# Patient Record
Sex: Male | Born: 1960 | Race: Black or African American | Hispanic: No | Marital: Single | Smoking: Former smoker
Health system: Southern US, Community
[De-identification: ages and names within clinical notes are randomized; demographics above are authoritative.]

## PROBLEM LIST (undated history)

## (undated) DIAGNOSIS — N400 Enlarged prostate without lower urinary tract symptoms: Secondary | ICD-10-CM

## (undated) DIAGNOSIS — G4733 Obstructive sleep apnea (adult) (pediatric): Secondary | ICD-10-CM

## (undated) DIAGNOSIS — R7303 Prediabetes: Secondary | ICD-10-CM

## (undated) DIAGNOSIS — E119 Type 2 diabetes mellitus without complications: Secondary | ICD-10-CM

## (undated) DIAGNOSIS — R7989 Other specified abnormal findings of blood chemistry: Secondary | ICD-10-CM

## (undated) DIAGNOSIS — M75100 Unspecified rotator cuff tear or rupture of unspecified shoulder, not specified as traumatic: Secondary | ICD-10-CM

## (undated) DIAGNOSIS — M75120 Complete rotator cuff tear or rupture of unspecified shoulder, not specified as traumatic: Secondary | ICD-10-CM

## (undated) DIAGNOSIS — I517 Cardiomegaly: Secondary | ICD-10-CM

## (undated) DIAGNOSIS — I10 Essential (primary) hypertension: Secondary | ICD-10-CM

## (undated) DIAGNOSIS — S86019A Strain of unspecified Achilles tendon, initial encounter: Secondary | ICD-10-CM

## (undated) DIAGNOSIS — E785 Hyperlipidemia, unspecified: Secondary | ICD-10-CM

## (undated) DIAGNOSIS — N529 Male erectile dysfunction, unspecified: Secondary | ICD-10-CM

## (undated) HISTORY — PX: OTHER SURGICAL HISTORY: SHX169

## (undated) HISTORY — DX: Benign prostatic hyperplasia without lower urinary tract symptoms: N40.0

## (undated) HISTORY — DX: Male erectile dysfunction, unspecified: N52.9

## (undated) HISTORY — DX: Strain of unspecified achilles tendon, initial encounter: S86.019A

## (undated) HISTORY — DX: Hyperlipidemia, unspecified: E78.5

## (undated) HISTORY — DX: Morbid (severe) obesity due to excess calories: E66.01

## (undated) HISTORY — DX: Essential (primary) hypertension: I10

## (undated) HISTORY — DX: Other specified abnormal findings of blood chemistry: R79.89

---
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
## 2005-10-19 HISTORY — PX: ROTATOR CUFF REPAIR: SHX139

## 2006-02-10 ENCOUNTER — Ambulatory Visit (HOSPITAL_COMMUNITY): Admission: RE | Admit: 2006-02-10 | Discharge: 2006-02-11 | Payer: Self-pay | Admitting: Specialist

## 2006-11-30 ENCOUNTER — Ambulatory Visit: Payer: Self-pay

## 2006-12-29 ENCOUNTER — Ambulatory Visit: Payer: Self-pay

## 2009-04-06 ENCOUNTER — Encounter: Admission: RE | Admit: 2009-04-06 | Discharge: 2009-04-06 | Payer: Self-pay | Admitting: Specialist

## 2011-03-06 NOTE — Op Note (Signed)
NAMEFARD, BORUNDA          ACCOUNT NO.:  [id number]   MEDICAL RECORD NO.:  [id number]          PATIENT TYPE:  AMB   LOCATION:  DAY                          FACILITY:  [facility]   PHYSICIAN:  Jene Every, M.D.    DATE OF BIRTH:  August 26, 1961   DATE OF PROCEDURE:  02/10/2006  DATE OF DISCHARGE:                                 OPERATIVE REPORT   PREOPERATIVE DIAGNOSIS:  Massive tear of the rotator cuff, left shoulder,  impingement syndrome.   POSTOPERATIVE DIAGNOSIS:  Massive tear of the rotator cuff, left shoulder,  impingement syndrome.   OPERATION PERFORMED:  1.  Left rotator cuff repair, open mini.  2.  Subacromial decompression.  3.  Acromioplasty.   SURGEON:  Jene Every, M.D.   ASSISTANT:  Roma Schanz, P.A.   ESTIMATED BLOOD LOSS:  10 mL.   ANESTHESIA:  General.   INDICATIONS FOR PROCEDURE:  This is a 50 year old male who has had a massive  rotator cuff tear retracted to the glenohumeral joint and some glenohumeral  arthrosis.  Had pain and suboptimal range of motion.  Due to his young age,  he is indicated for repair of the rotator cuff though given the extent of  the tear, we thought we may not be able to repair it.  We did discuss the  risks and benefits including bleeding, infection, suboptimal range of  motion, recurrent __________  tear and need for patch or revision,  persistent pain, etc.   DESCRIPTION OF PROCEDURE:  Patient supine in beach chair position.  After  adequate induction of adequate general anesthesia and 1 g Kefzol, the left  shoulder and upper extremity was prepped and draped in the usual sterile  fashion.  Incision was made over the anterior aspect of the acromion in  Langer's lines.  Subcutaneous tissue was dissected.  Electrocautery utilized  to achieve hemostasis.  We divided the raphe between the anterior and the  middle head of the deltoid.  It was then full thickness subperiosteal  elevation from the deltoid attachment  medially and anterolaterally.  We  preserved its most superior attachment.  We divided the CA ligament and  excised that.  We placed a Bennett retractor in the subacromial space and  protected the rotator cuff with Hohmann's.  Used an oscillating saw to  perform an acromioplasty, there was a large anterior spur noted.  This was  further contoured with a high speed bur.  Next, following that we examined  the rotator cuff.  It was completely torn and retracted back towards the  medial aspect of the humeral head.  It appeared to be supraspinatus as well  as infraspinatus and also partly subscapularis.  These were debrided.  Formed a trough just lateral to the articular surface.  Approximately 2 mm  of articular surface was used as well to the greater tuberosity.  Good  cancellous tissue was noted and this was performed across the lateral,  anterior and posterior aspect of the humeral edges up to bicipital groove.  There was some tendinopathy of the bicipital tendon noted as well.  We  mobilized the cuff with an Allis.  This was digitally mobilized as well as  mobilized with a Cobb lysing subacromial adhesions.  Though the tendon  appeared to be intact, it was very attenuated.  This was advanced to the  trough.  Three Mitek suture anchors utilized to anchor to the trough, one  posteriorly, one laterally and one anteriorly just lateral to the bicipital  groove of humeral head.  Arm was abducted and this was advanced into the  tendon and the tendon was held in place with a suture with an excellent  repair.  Able to get his arm down to the side without tension on the repair.  Going anteriorly, one of the leaflets of the Ethibond to secure the subscap  and supraspinatus.  This was oversewn with #1 Vicryl interrupted figure-of-  eight sutures.  He had a full watertight closure noted.  There was no  residual impingement or spurring noted.  He was not tender over the [employer]  preoperatively.  Felt no need  at this point to perform distal clavicle  resection. Wound copiously irrigated once again.  No evidence of active  bleeding.  We has used 0.25% Marcaine with epinephrine in the subcutaneous  tissue.  We then repaired the raphe with #1 Vicryl interrupted figure-of-  eight sutures.  In the fascia subperiosteally we repaired that back as well  for the acromion.  The deltotrapezial fascia was repaired as well as the  subcutaneous tissue with 2-0 Vicryl and the skin with 4-0 subcuticular  Prolene.  Wound reinforced with Steri-Strips.  Sterile dressing applied.  The patient had abduction pillow, extubated without difficulty and  transported to recovery room satisfactory condition.  The patient tolerated  the procedure well without complication.      Jene Every, M.D.  Electronically Signed     JB/MEDQ  D:  02/10/2006  T:  02/11/2006  Job:  [id number]

## 2011-10-20 HISTORY — PX: DOPPLER ECHOCARDIOGRAPHY: SHX263

## 2012-04-17 ENCOUNTER — Encounter (HOSPITAL_COMMUNITY): Payer: Self-pay | Admitting: *Deleted

## 2012-04-17 ENCOUNTER — Observation Stay (HOSPITAL_COMMUNITY)
Admission: EM | Admit: 2012-04-17 | Discharge: 2012-04-19 | Disposition: A | Discharge status: Home / Self Care | Payer: BC Managed Care – PPO | Attending: Family Medicine | Admitting: Family Medicine

## 2012-04-17 ENCOUNTER — Emergency Department (HOSPITAL_COMMUNITY): Payer: BC Managed Care – PPO

## 2012-04-17 DIAGNOSIS — Z7982 Long term (current) use of aspirin: Secondary | ICD-10-CM | POA: Insufficient documentation

## 2012-04-17 DIAGNOSIS — R0602 Shortness of breath: Secondary | ICD-10-CM | POA: Insufficient documentation

## 2012-04-17 DIAGNOSIS — G4733 Obstructive sleep apnea (adult) (pediatric): Secondary | ICD-10-CM

## 2012-04-17 DIAGNOSIS — Z87891 Personal history of nicotine dependence: Secondary | ICD-10-CM | POA: Insufficient documentation

## 2012-04-17 DIAGNOSIS — R079 Chest pain, unspecified: Principal | ICD-10-CM

## 2012-04-17 DIAGNOSIS — I10 Essential (primary) hypertension: Secondary | ICD-10-CM

## 2012-04-17 DIAGNOSIS — Z79899 Other long term (current) drug therapy: Secondary | ICD-10-CM | POA: Insufficient documentation

## 2012-04-17 HISTORY — DX: Obstructive sleep apnea (adult) (pediatric): G47.33

## 2012-04-17 LAB — PROTIME-INR
INR: 0.9 (ref 0.00–1.49)
Prothrombin Time: 12.3 seconds (ref 11.6–15.2)

## 2012-04-17 LAB — COMPREHENSIVE METABOLIC PANEL
Albumin: 4 g/dL (ref 3.5–5.2)
BUN: 14 mg/dL (ref 6–23)
Calcium: 9.5 mg/dL (ref 8.4–10.5)
Creatinine, Ser: 1.14 mg/dL (ref 0.50–1.35)
GFR calc Af Amer: 85 mL/min — ABNORMAL LOW (ref >90)
Glucose, Bld: 109 mg/dL — ABNORMAL HIGH (ref 70–99)
Total Protein: 6.8 g/dL (ref 6.0–8.3)

## 2012-04-17 LAB — CBC
Hemoglobin: 15.1 g/dL (ref 13.0–17.0)
MCHC: 31.7 g/dL (ref 30.0–36.0)
MCV: 64.3 fL — ABNORMAL LOW (ref 78.0–100.0)
Platelets: 247 10*3/uL (ref 150–400)
RBC: 7.4 MIL/uL — ABNORMAL HIGH (ref 4.22–5.81)
RDW: 15.6 % — ABNORMAL HIGH (ref 11.5–15.5)

## 2012-04-17 LAB — URINE MICROSCOPIC-ADD ON

## 2012-04-17 LAB — URINALYSIS, ROUTINE W REFLEX MICROSCOPIC
Bilirubin Urine: NEGATIVE
Hgb urine dipstick: NEGATIVE
Nitrite: NEGATIVE
Protein, ur: NEGATIVE mg/dL
Urobilinogen, UA: 1 mg/dL (ref 0.0–1.0)

## 2012-04-17 LAB — APTT: aPTT: 33 seconds (ref 24–37)

## 2012-04-17 MED ORDER — ASPIRIN 81 MG PO CHEW: 324.0000 mg | CHEWABLE_TABLET | Freq: Once | ORAL | Status: DC

## 2012-04-17 MED ORDER — NITROGLYCERIN 2 % TD OINT
1.0000 [in_us] | TOPICAL_OINTMENT | Freq: Four times a day (QID) | TRANSDERMAL | Status: DC
  Filled 2012-04-17: qty 30

## 2012-04-17 MED ORDER — NITROGLYCERIN 0.4 MG SL SUBL
0.4000 mg | SUBLINGUAL_TABLET | SUBLINGUAL | Status: DC | PRN
  Administered 2012-04-18: 0.4 mg via SUBLINGUAL

## 2012-04-17 MED ORDER — SODIUM CHLORIDE 0.9 % IV SOLN
1000.0000 mL | INTRAVENOUS | Status: DC
  Administered 2012-04-17: 1000 mL via INTRAVENOUS

## 2012-04-17 NOTE — ED Provider Notes (Signed)
History     CSN: [id number]  Arrival date & time 04/17/12  2048   First MD Initiated Contact with Patient 04/17/12 2107      Chief Complaint  Patient presents with  . Chest Pain    HPI   The patient presents to the emergency room with complaints of chest tightness that started today at about 1530. Patient states he was at work when he suddenly experienced chest discomfort that lasted for a few minutes.  He felt somewhat short of breath with it but did not have any nausea or diaphoresis. The pain did not radiate anywhere. Patient does not have history of heart disease. He does not smoke cigarettes but did quit a few years ago.. There is no family history of heart problems. Patient states he has had some continued discomfort in his chest since the symptoms started. Nothing seems to be making it particularly better. The pain is moderate in nature. Pt states he has not been having a cough and just had one episode today. Past Medical History  Diagnosis Date  . Hypertension     History reviewed. No pertinent past surgical history.  History reviewed. No pertinent family history.  History  Substance Use Topics  . Smoking status: Former Smoker    Quit date: 04/17/2006  . Smokeless tobacco: Not on file  . Alcohol Use:       Review of Systems  All other systems reviewed and are negative.    Allergies  Review of patient's allergies indicates no known allergies.  Home Medications   Current Outpatient Rx  Name Route Sig Dispense Refill  . ASPIRIN EC 81 MG PO TBEC Oral Take 81 mg by mouth daily.    Marland Kitchen LOSARTAN POTASSIUM 100 MG PO TABS Oral Take 100 mg by mouth daily.      BP 147/91  Pulse 80  Temp 97.9 F (36.6 C) (Oral)  Resp 18  SpO2 98%  Physical Exam  Nursing note and vitals reviewed. Constitutional: He appears well-developed and well-nourished. No distress.  HENT:  Head: Normocephalic and atraumatic.  Right Ear: External ear normal.  Left Ear: External ear normal.    Eyes: Conjunctivae are normal. Right eye exhibits no discharge. Left eye exhibits no discharge. No scleral icterus.  Neck: Neck supple. No tracheal deviation present.  Cardiovascular: Normal rate, regular rhythm and intact distal pulses.   Pulmonary/Chest: Effort normal and breath sounds normal. No stridor. No respiratory distress. He has no wheezes. He has no rales.  Abdominal: Soft. Bowel sounds are normal. He exhibits no distension. There is no tenderness. There is no rebound and no guarding.  Musculoskeletal: He exhibits no edema and no tenderness.  Neurological: He is alert. He has normal strength. No sensory deficit. Cranial nerve deficit:  no gross defecits noted. He exhibits normal muscle tone. He displays no seizure activity. Coordination normal.  Skin: Skin is warm and dry. No rash noted.  Psychiatric: He has a normal mood and affect.    ED Course  Procedures (including critical care time)  Rate: 76  Rhythm: normal sinus rhythm  QRS Axis: normal  Intervals: normal  ST/T Wave abnormalities: normal  Conduction Disutrbances:none  Narrative Interpretation:   Old EKG Reviewed: none available  Labs Reviewed  CBC - Abnormal; Notable for the following:    RBC 7.40 (*)     MCV 64.3 (*)     MCH 20.4 (*)     RDW 15.6 (*)     All other components within  normal limits  URINALYSIS, ROUTINE W REFLEX MICROSCOPIC - Abnormal; Notable for the following:    Leukocytes, UA SMALL (*)     All other components within normal limits  COMPREHENSIVE METABOLIC PANEL - Abnormal; Notable for the following:    Glucose, Bld 109 (*)     Total Bilirubin 0.2 (*)     GFR calc non Af Amer 73 (*)     GFR calc Af Amer 85 (*)     All other components within normal limits  PROTIME-INR  APTT  POCT I-STAT TROPONIN I  URINE MICROSCOPIC-ADD ON  PRO B NATRIURETIC PEPTIDE   Dg Chest Portable 1 View  04/17/2012  *RADIOLOGY REPORT*  Clinical Data: Chest pain  PORTABLE CHEST - 1 VIEW  Comparison: None.   Findings: Prominent cardiomediastinal contours.  Central vascular congestion.  Mild interstitial prominence.  No pneumothorax.  No definite pleural effusion.  No focal consolidation.  No acute osseous finding.  IMPRESSION: Prominent cardiomediastinal contours and central vascular congestion.  Question mild interstitial edema versus atypical infection.  Original Report Authenticated By: Waneta Martins, M.D.     1. Chest pain       MDM  CXR suggests possible interstitial edema.  Will check BNP.    Symptoms are concerning for possible ACS.  Will continue to check cardiac markers.  Will consult regarding admission for rule out and risk stratification        Celene Kras, MD 04/17/12 2333

## 2012-04-17 NOTE — ED Notes (Signed)
Pt c/o chest tightness x 3 separate episodes, 1st at 1530 today. No associated s/s at any time. 5-6/10 tightness at present. Pt presents as pale. Pt states pain worsened by coughing which started a few minutes ago.

## 2012-04-18 DIAGNOSIS — R079 Chest pain, unspecified: Secondary | ICD-10-CM | POA: Diagnosis present

## 2012-04-18 DIAGNOSIS — I10 Essential (primary) hypertension: Secondary | ICD-10-CM

## 2012-04-18 DIAGNOSIS — G4733 Obstructive sleep apnea (adult) (pediatric): Secondary | ICD-10-CM

## 2012-04-18 HISTORY — DX: Essential (primary) hypertension: I10

## 2012-04-18 LAB — HEMOGLOBIN A1C
Hgb A1c MFr Bld: 5.9 % — ABNORMAL HIGH (ref <5.7)
Mean Plasma Glucose: 123 mg/dL — ABNORMAL HIGH (ref <117)

## 2012-04-18 LAB — CBC
HCT: 44.8 % (ref 39.0–52.0)
Hemoglobin: 14 g/dL (ref 13.0–17.0)
MCH: 20.3 pg — ABNORMAL LOW (ref 26.0–34.0)
MCHC: 31.3 g/dL (ref 30.0–36.0)
MCV: 64.8 fL — ABNORMAL LOW (ref 78.0–100.0)
RDW: 15.6 % — ABNORMAL HIGH (ref 11.5–15.5)

## 2012-04-18 LAB — CARDIAC PANEL(CRET KIN+CKTOT+MB+TROPI)
CK, MB: 4.9 ng/mL — ABNORMAL HIGH (ref 0.3–4.0)
CK, MB: 4 ng/mL (ref 0.3–4.0)
CK, MB: 3.7 ng/mL (ref 0.3–4.0)
Relative Index: 2.4 (ref 0.0–2.5)
Total CK: 162 U/L (ref 7–232)
Troponin I: <0.3 ng/mL (ref <0.30)
Troponin I: <0.3 ng/mL (ref <0.30)

## 2012-04-18 LAB — TSH: TSH: 1.225 u[IU]/mL (ref 0.350–4.500)

## 2012-04-18 LAB — LIPID PANEL
LDL Cholesterol: 64 mg/dL (ref 0–99)
Triglycerides: 120 mg/dL (ref <150)

## 2012-04-18 LAB — CREATININE, SERUM: GFR calc non Af Amer: 70 mL/min — ABNORMAL LOW (ref >90)

## 2012-04-18 MED ORDER — ENOXAPARIN SODIUM 40 MG/0.4ML ~~LOC~~ SOLN
40.0000 mg | SUBCUTANEOUS | Status: DC
  Administered 2012-04-18 – 2012-04-19 (×2): 40 mg via SUBCUTANEOUS
  Filled 2012-04-18 (×2): qty 0.4

## 2012-04-18 MED ORDER — ACETAMINOPHEN 650 MG RE SUPP: 650.0000 mg | Freq: Four times a day (QID) | RECTAL | Status: DC | PRN

## 2012-04-18 MED ORDER — ATORVASTATIN CALCIUM 10 MG PO TABS
10.0000 mg | ORAL_TABLET | Freq: Every day | ORAL | Status: DC
  Administered 2012-04-18: 10 mg via ORAL
  Filled 2012-04-18 (×2): qty 1

## 2012-04-18 MED ORDER — SODIUM CHLORIDE 0.9 % IV SOLN: 250.0000 mL | INTRAVENOUS | Status: DC | PRN

## 2012-04-18 MED ORDER — ASPIRIN EC 325 MG PO TBEC
325.0000 mg | DELAYED_RELEASE_TABLET | Freq: Every day | ORAL | Status: DC
  Administered 2012-04-18 – 2012-04-19 (×2): 325 mg via ORAL
  Filled 2012-04-18 (×2): qty 1

## 2012-04-18 MED ORDER — PANTOPRAZOLE SODIUM 40 MG PO TBEC
40.0000 mg | DELAYED_RELEASE_TABLET | Freq: Every day | ORAL | Status: DC
  Administered 2012-04-18 – 2012-04-19 (×2): 40 mg via ORAL
  Filled 2012-04-18 (×2): qty 1

## 2012-04-18 MED ORDER — SODIUM CHLORIDE 0.9 % IJ SOLN
3.0000 mL | Freq: Two times a day (BID) | INTRAMUSCULAR | Status: DC
  Administered 2012-04-18 – 2012-04-19 (×2): 3 mL via INTRAVENOUS

## 2012-04-18 MED ORDER — MORPHINE SULFATE 2 MG/ML IJ SOLN: 2.0000 mg | INTRAMUSCULAR | Status: DC | PRN

## 2012-04-18 MED ORDER — SODIUM CHLORIDE 0.9 % IJ SOLN
3.0000 mL | Freq: Two times a day (BID) | INTRAMUSCULAR | Status: DC
  Administered 2012-04-18: 3 mL via INTRAVENOUS

## 2012-04-18 MED ORDER — SODIUM CHLORIDE 0.9 % IJ SOLN: 3.0000 mL | INTRAMUSCULAR | Status: DC | PRN

## 2012-04-18 MED ORDER — ACETAMINOPHEN 325 MG PO TABS: 650.0000 mg | ORAL_TABLET | Freq: Four times a day (QID) | ORAL | Status: DC | PRN

## 2012-04-18 MED ORDER — LOSARTAN POTASSIUM 50 MG PO TABS
100.0000 mg | ORAL_TABLET | Freq: Every day | ORAL | Status: DC
  Administered 2012-04-18 – 2012-04-19 (×2): 100 mg via ORAL
  Filled 2012-04-18 (×2): qty 2

## 2012-04-18 NOTE — Progress Notes (Signed)
  Echocardiogram 2D Echocardiogram has been performed.  Jorje Guild 04/18/2012, 9:42 AM

## 2012-04-18 NOTE — ED Notes (Signed)
Report given to 4 th floor 

## 2012-04-18 NOTE — Progress Notes (Signed)
CPAP setup for pt to use at night. Placed on 10 cmH2O at this time with full face mask because he says he wears that at home. Pt unaware of home setting, but says this feels about right.  Jacqulynn Cadet RRT

## 2012-04-18 NOTE — H&P (Signed)
Hospital Admission Note Date: 04/18/2012  Patient name: Marc Stevens Medical record number: [id number] Date of birth: Sep 06, 1961 Age: 51 y.o. Gender: male PCP: No primary provider on file.  Attending physician: Altha Harm, MD  Chief Complaint: Intermittent chest pain  History of Present Illness: This is a 51 year old gentleman with a history of hypertension and obstructive sleep apnea who presented to the ER as he experienced intermittent chest tightness that occurred on 3 episodes this afternoon for approximately 20 minutes each episode. The patient states that he was performing nonstrenuous work at the time that the chest tightness occurred. He's unable to quantify the intensity of the tightness but stated that the pain was nonradiating and he had no palliative or provocative features and there were no accompanying symptoms. Specifically the patient denies any shortness of breath, diaphoresis, lightheadedness or weakness.  The patient states that he was in his usual state of health. He denies any fever, chills, nausea, vomiting, diarrhea, dysuria, frequency, syncope, near syncope or cough.  Scheduled Meds:   . aspirin  324 mg Oral Once  . DISCONTD: nitroGLYCERIN  1 inch Topical Q6H   Continuous Infusions:   . sodium chloride 1,000 mL (04/17/12 2213)   PRN Meds:.nitroGLYCERIN Allergies: Review of patient's allergies indicates no known allergies. Past Medical History  Diagnosis Date  . Hypertension    History reviewed. No pertinent past surgical history. History reviewed. No pertinent family history. History   Social History  . Marital Status: Single    Spouse Name: N/A    Number of Children: N/A  . Years of Education: N/A   Occupational History  . Not on file.   Social History Main Topics  . Smoking status: Former Smoker    Quit date: 04/17/2006  . Smokeless tobacco: Not on file  . Alcohol Use:   . Drug Use: No  . Sexually Active: Yes    Birth Control/  Protection: None   Other Topics Concern  . Not on file   Social History Narrative  . No narrative on file   Review of Systems: A comprehensive review of systems was negative except as noted in the history of present illness. Physical Exam: No intake or output data in the 24 hours ending 04/18/12 0039 General: The patient is a well-nourished well-developed gentleman in no acute distress resting comfortably in the bed. He is alert and oriented x3. HEENT: [place]/AT PEERL, EOMI Neck: Trachea midline,  no masses, no thyromegal,y no JVD, no carotid bruit OROPHARYNX:  Moist, No exudate/ erythema/lesions.  Heart: Regular rate and rhythm, without murmurs, rubs, gallops, PMI non-displaced, no heaves or thrills on palpation.  Lungs: Clear to auscultation, no wheezing or rhonchi noted. No increased vocal fremitus resonant to percussion  Abdomen: Soft, nontender, nondistended, positive bowel sounds, no masses no hepatosplenomegaly noted..  Neuro: No focal neurological deficits noted cranial nerves II through XII grossly intact. DTRs 2+ bilaterally upper and lower extremities. Strength functional in bilateral upper and lower extremities. Musculoskeletal: No warm swelling or erythema around joints, no spinal tenderness noted. Psychiatric: Patient alert and oriented x3, good insight and cognition, good recent to remote recall. Lymph node survey: No cervical axillary or inguinal lymphadenopathy noted.  Lab results:  [facility] 04/17/12 2132  NA 138  K 3.7  CL 103  CO2 25  GLUCOSE 109*  BUN 14  CREATININE 1.14  CALCIUM 9.5  MG --  PHOS --    Basename 04/17/12 2132  AST 26  ALT 29  ALKPHOS 62  BILITOT 0.2*  PROT 6.8  ALBUMIN 4.0   No results found for this basename: LIPASE:2,AMYLASE:2 in the last 72 hours  Basename 04/17/12 2132  WBC 6.1  NEUTROABS --  HGB 15.1  HCT 47.6  MCV 64.3*  PLT 247   No results found for this basename: CKTOTAL:3,CKMB:3,CKMBINDEX:3,TROPONINI:3 in the last 72  hours No components found with this basename: POCBNP:3 No results found for this basename: DDIMER:2 in the last 72 hours No results found for this basename: HGBA1C:2 in the last 72 hours No results found for this basename: CHOL:2,HDL:2,LDLCALC:2,TRIG:2,CHOLHDL:2,LDLDIRECT:2 in the last 72 hours No results found for this basename: TSH,T4TOTAL,FREET3,T3FREE,THYROIDAB in the last 72 hours No results found for this basename: VITAMINB12:2,FOLATE:2,FERRITIN:2,TIBC:2,IRON:2,RETICCTPCT:2 in the last 72 hours Imaging results:  Dg Chest Portable 1 View  04/17/2012  *RADIOLOGY REPORT*  Clinical Data: Chest pain  PORTABLE CHEST - 1 VIEW  Comparison: None.  Findings: Prominent cardiomediastinal contours.  Central vascular congestion.  Mild interstitial prominence.  No pneumothorax.  No definite pleural effusion.  No focal consolidation.  No acute osseous finding.  IMPRESSION: Prominent cardiomediastinal contours and central vascular congestion.  Question mild interstitial edema versus atypical infection.  Original Report Authenticated By: Waneta Martins, M.D.   Other results: EKG: normal EKG, normal sinus rhythm.   Patient Active Hospital Problem List: Chest pain (04/18/2012)   Assessment: The patient presents with intermittent chest pain. His risk factors are his gender and hypertension. He does however states that his father may have had a heart attack however it was his advanced age. The patient does not know his cholesterol numbers. Thus his chest port is 8% without his cholesterol numbers. The patient will be brought observation basis his enzyme cycle and a 2-D echocardiogram obtained. I will defer to the rounding physician to determine the need for a stress test based on further risk stratification.    HTN (hypertension) (04/18/2012)   Assessment: I resume his Cozaar.    Obstructive sleep apnea of adult (04/18/2012)   Assessment: Last respiratory to supply CPAP while the patient is hospitalized.        Mahogany Torrance A. 04/18/2012, 12:39 AM

## 2012-04-18 NOTE — Progress Notes (Signed)
12:03 PM I agree with HPI/GPe and A/P per Dr. Ashley Royalty  He still has some mild CP, 4/10 center of chest, -feels like a pressure.  No radiation, relieved by Morphine He states he has had this constantly since admit      HEENT-pleasant alert [name] in NAD CHEST clear.  No reproducible CP CARDIAC s1 s2 no m/r/g ABDOMEN soft, nt nd NEURO moves limbs x 4 SKIN/MUSCULAR no rash  Patient Active Problem List  Diagnosis  . Chest pain  . HTN (hypertension)  . Obstructive sleep apnea of adult   Given OSA, Htn h/o and features not consistent with any other findings will rpt CXr and cycle CE's Might need cardiologist eval for stress test vs cath  Marc Koch, MD [facility] [phone]

## 2012-04-18 NOTE — Care Management Note (Signed)
    Page 1 of 1   04/18/2012     2:16:27 PM   CARE MANAGEMENT NOTE 04/18/2012  Patient:  Marc Stevens, Marc Stevens   Account Number:  [id number]  Date Initiated:  04/18/2012  Documentation initiated by:  Lanier Clam  Subjective/Objective Assessment:   ADMITTED W/CHEST PAIN.     Action/Plan:   FROM HOME   Anticipated DC Date:  04/21/2012   Anticipated DC Plan:  ACUTE TO ACUTE TRANS      DC Planning Services  CM consult      Choice offered to / List presented to:             Status of service:  In process, will continue to follow Medicare Important Message given?   (If response is "NO", the following Medicare IM given date fields will be blank) Date Medicare IM given:   Date Additional Medicare IM given:    Discharge Disposition:  ACUTE TO ACUTE TRANS  Per UR Regulation:  Reviewed for med. necessity/level of care/duration of stay  If discussed at Long Length of Stay Meetings, dates discussed:    Comments:  04/18/12 Teren Franckowiak RN,BSN NCM [phone] FOR TRANSFER TO [facility].

## 2012-04-19 ENCOUNTER — Encounter (HOSPITAL_COMMUNITY): Payer: Self-pay | Admitting: Cardiology

## 2012-04-19 DIAGNOSIS — R079 Chest pain, unspecified: Secondary | ICD-10-CM

## 2012-04-19 MED ORDER — ATORVASTATIN CALCIUM 10 MG PO TABS: 10.0000 mg | ORAL_TABLET | Freq: Every day | ORAL | Status: DC

## 2012-04-19 NOTE — Consult Note (Signed)
CARDIOLOGY CONSULT NOTE  [name], MRN: [id number], DOB/AGE: 1961/05/25 51 y.o. Admit date: 04/17/2012 Date of Consult: 04/19/2012  Primary Physician: Dr. Gabriel Cirri Primary Cardiologist: None  Chief Complaint: chest pain Reason for Consultation: chest pain  HPI: 51 y.o. male w/ PMHx significant for tobacco abuse (quit 2008), HTN, and OSA who presented to [facility] on 04/17/2012 with complaints of chest pain.  No prior cardiac history. No prior echo, stress, or cath. On 6/30 he was in his usual state of health, when at work ([employer]) around 4pm he was taping boxes and experienced substernal chest tightness. He thought he pulled a muscle so he took a bayer aspirin which "loosened the pain". He went back to work and felt the chest tightness again. He then went to lunch and after eating experienced worsening of the chest tightness prompting him to present to the [facility] ED. Has never experienced these symptoms before. No associated sob, diaphoresis, nausea, or dizziness. Denies burning in his chest/throat, burping, or abnormal taste in his mouth. Reported that it was worse if he rolled on his right side. No change with deep inspiration. Walks on the treadmill 3-4 times a week for about 30 minutes and uses bowflex without chest pain or sob. Denies recent illness, fever, chills, cough, orthopnea, sob, edema, prolonged immobility/travel, calf pain, difficulty swallowing food/liquid.   In the ED EKG showed NSR 65bpm, no acute ischemic changes. CXR showed ? Mild interstitial edema vs atypical infection. He received 324mg  ASA and SL NTG with some relief. Labs were significant for normal poc troponin, BNP 35, unremarkable CBC/CMET, UA nitrite negative. The pain resolved last evening without intervention.  Past Medical History  Diagnosis Date  . Hypertension       Surgical History:  Past Surgical History  Procedure Date  . Rotator cuff repair 2007    Left     Home  Meds: Medication Sig  aspirin EC 81 MG tablet Take 81 mg by mouth daily.  losartan (COZAAR) 100 MG tablet Take 100 mg by mouth daily.    Inpatient Medications:   . aspirin EC  325 mg Oral Daily  . atorvastatin  10 mg Oral q1800  . enoxaparin  40 mg Subcutaneous Q24H  . losartan  100 mg Oral Daily  . pantoprazole  40 mg Oral Daily  . sodium chloride  3 mL Intravenous Q12H  . sodium chloride  3 mL Intravenous Q12H      . sodium chloride 1,000 mL (04/17/12 2213)    Allergies: No Known Allergies  History   Social History  . Marital Status: Single    Spouse Name: N/A    Number of Children: N/A  . Years of Education: N/A   Occupational History  . Not on file.   Social History Main Topics  . Smoking status: Former Smoker    Quit date: 04/17/2006  . Smokeless tobacco: Not on file  . Alcohol Use:   . Drug Use: No  . Sexually Active: Yes    Birth Control/ Protection: None   Other Topics Concern  . Not on file   Social History Narrative  . No narrative on file     Family history: ? Father MI in his 60s  Review of Systems: General: (+) occ night sweats; negative for chills, fever or weight changes.  Cardiovascular: (+) chest pain; negative for shortness of breath, dyspnea on exertion, edema, orthopnea, palpitations, or paroxysmal nocturnal dyspnea Dermatological: negative for rash Respiratory: negative  for cough or wheezing Urologic: negative for hematuria Abdominal: negative for nausea, vomiting, diarrhea, bright red blood per rectum, melena, or hematemesis Neurologic: negative for visual changes, syncope, or dizziness All other systems reviewed and are otherwise negative except as noted above.  Labs:  [facility] 04/18/12 1657 04/18/12 0840 04/18/12 0138  CKTOTAL 144 162 208  CKMB 3.7 4.0 4.9*  TROPONINI <0.30 <0.30 <0.30   Component Value Date   WBC 6.7 04/18/2012   HGB 14.0 04/18/2012   HCT 44.8 04/18/2012   MCV 64.8* 04/18/2012   PLT 232 04/18/2012    Lab  04/18/12 0138 04/17/12 2132  NA -- 138  K -- 3.7  CL -- 103  CO2 -- 25  BUN -- 14  CREATININE 1.18 --  CALCIUM -- 9.5  PROT -- 6.8  BILITOT -- 0.2*  ALKPHOS -- 62  ALT -- 29  AST -- 26  GLUCOSE -- 109*   Component Value Date   CHOL 139 04/18/2012   HDL 51 04/18/2012   LDLCALC 64 04/18/2012   TRIG 120 04/18/2012     04/18/2012 01:38  TSH 1.225     04/18/2012 01:38  Hemoglobin A1C 5.9 (H)    04/17/2012 21:32  Pro B Natriuretic peptide (BNP) 35.1     Radiology/Studies:   04/18/12 - 2D Echocardiogram nl LV systolic/diastolic fxn EF 50-55%, nl RV systolic fxn, no significant vavlvular abnls, no pericardial effusion  04/17/2012 - Chest Portable 1 View Findings: Prominent cardiomediastinal contours.  Central vascular congestion.  Mild interstitial prominence.  No pneumothorax.  No definite pleural effusion.  No focal consolidation.  No acute osseous finding.  IMPRESSION: Prominent cardiomediastinal contours and central vascular congestion.  Question mild interstitial edema versus atypical infection.     EKG: 04/18/12 1154 - NSR 65bpm, no acute ischemic changes  Physical Exam: Blood pressure 139/81, pulse 65, temperature 97.4 F (36.3 C), temperature source Oral, resp. rate 18, height 5\' 11"  (1.803 m), weight 258 lb 6.1 oz (117.2 kg), SpO2 97.00%. General: Well developed, well nourished, middle-aged black male in no acute distress. Head: Normocephalic, atraumatic, sclera non-icteric, no xanthomas, nares are without discharge.  Neck: Supple. Negative for carotid bruits. JVD not elevated. Lungs: Clear bilaterally to auscultation without wheezes, rales, or rhonchi. Breathing is unlabored. Heart: RRR with S1 S2. No murmurs, rubs, or gallops appreciated. Abdomen: Soft, non-tender, non-distended with normoactive bowel sounds. No hepatomegaly. No rebound/guarding. No obvious abdominal masses. Msk:  Strength and tone appear normal for age. Extremities: No clubbing or cyanosis. No edema.  Distal  pedal pulses are intact and equal bilaterally. Neuro: Alert and oriented X 3. Moves all extremities spontaneously. Psych:  Responds to questions appropriately with a normal affect.   Assessment and Plan:  51 y.o. male w/ PMHx significant for tobacco abuse (quit 2008), HTN, and OSA who presented to [facility] on 04/17/2012 with complaints of chest pain.  1. Chest Pain: Presents with atypical symptoms that occurred two days ago.  Chest pain has resolved. Cardiac enzymes normal x3. EKG nonacute. Echo without abnormalities. Do not suspect his chest pain is cardiac in etiology, more consistent with musculoskeletal pain. Lipids normal, slightly elevated A1C (5.9). Cardiac risk factors include h/o tobacco abuse and htn. No need for further inpatient cardiac evaluation. Will arrange for outpatient exercise stress test. CXR with ? Mild interstitial edema vs atypical infection. Euvolemic on exam with nl BNP. He has been afebrile without leukocytosis and no complaints of cough or weight loss. Does report night sweats a  couple nights a week for the last week or two.   2. HTN: SBPs 130-140s on 100mg  Losartan. Further BP management as outpatient with his PCP.  3. OSA: Cont use of nocturnal CPAP  Signed, HOPE, JESSICA PA-C 04/19/2012, 11:22 AM   As above, patient seen and examined. 51 year old male with past medical history of hypertension and sleep apnea for evaluation of chest pain. Patient typically does not have dyspnea on exertion, orthopnea, PND, pedal edema, palpitations, syncope or exertional chest pain. On June 30 the patient developed chest pain while at work. It was in the left breast area and described as a tightness. The pain was not pleuritic, positional, related to food or exertional. No radiation. No associated symptoms. Pain resolved but returned later and he was admitted and ruled out for myocardial infarction. Electrocardiogram showed no ST changes. He had one brief episode of chest pain  yesterday after turning over but no other symptoms. Chest pain is atypical. Possibly musculoskeletal. From a cardiac standpoint patient can be discharged and we will arrange an exercise treadmill for risk stratification. Continue present medications for blood pressure control. Of note patient has microcytosis on his CBC. This will need further evaluation by his primary care physician. Olga Millers 12:25 PM

## 2012-04-19 NOTE — Discharge Summary (Addendum)
[facility] Hospital Discharge Summary  Date of Admission: 04/17/2012  8:51 PM Admitter: @ADMITPROV @   Date of Discharge7/11/2011 Attending Physician: Rhetta Mura, MD   Discharge diagnosis=unlikely non-cardiogenic CP  Things to Follow-up on: Needs Stress test-followup 2-D echo done Risk factor modification as outpatient   Pleasant 51 year old male with History of hypertension OSA presented with chest pain 04/18/12 after doing some physical activity. It was nonradiating and had no palliative or provocative features. Initial EKG showed no ischemic changes and his cardiac enzymes are negative. 2-D ECHO cardiogram was performed but results pending. Cardiology was consulted giving consent of his pain in the chest and they recommended an outpatient stress test. They attributed this to being likely musculoskeletal. Lipid panel at this admission with an LDL of 64. Patient was encouraged to follow up as an outpatient for obstructive modification and follow up for stress test as requested by cardiologist   Procedures Performed and pertinent labs: Dg Chest Portable 1 View  04/17/2012  *RADIOLOGY REPORT*  Clinical Data: Chest pain  PORTABLE CHEST - 1 VIEW  Comparison: None.  Findings: Prominent cardiomediastinal contours.  Central vascular congestion.  Mild interstitial prominence.  No pneumothorax.  No definite pleural effusion.  No focal consolidation.  No acute osseous finding.  IMPRESSION: Prominent cardiomediastinal contours and central vascular congestion.  Question mild interstitial edema versus atypical infection.  Original Report Authenticated By: Waneta Martins, M.D.    Discharge Vitals & PE:  BP 139/81  Pulse 65  Temp 97.4 F (36.3 C) (Oral)  Resp 18  Ht 5\' 11"  (1.803 m)  Wt 117.2 kg (258 lb 6.1 oz)  BMI 36.04 kg/m2  SpO2 97% Alert oriented no apparent distress Chest clear to clear no reproducible pain Abdomen obese nontender nondistended Lower extremities soft  nt  Discharge Labs:  Results for orders placed during the hospital encounter of 04/17/12 (from the past 24 hour(s))  CARDIAC PANEL(CRET KIN+CKTOT+MB+TROPI)     Status: Abnormal   Collection Time   04/18/12  4:57 PM      Component Value Range   Total CK 144  7 - 232 U/L   CK, MB 3.7  0.3 - 4.0 ng/mL   Troponin I <0.30  <0.30 ng/mL   Relative Index 2.6 (*) 0.0 - 2.5    Disposition and follow-up:   MarcDarrius J Stevens was discharged from in good condition.    Follow-up Appointments:  Follow-up Information    Follow up with  [facility] on 05/16/2012. (Exercise Stress Test @ 11:15 (Please arrive at 11:00))    Contact information:   [facility] [address] [place] [address] [phone]      Follow up with Gabriel Cirri, MD in 5 days.         Discharge Medications: Medication List  As of 04/19/2012  1:10 PM   TAKE these medications         aspirin EC 81 MG tablet   Take 81 mg by mouth daily.      atorvastatin 10 MG tablet   Commonly known as: LIPITOR   Take 1 tablet (10 mg total) by mouth daily at 6 PM.      losartan 100 MG tablet   Commonly known as: COZAAR   Take 100 mg by mouth daily.           Medications Discontinued During This Encounter  Medication Reason  . nitroGLYCERIN (NITROGLYN) 2 % ointment 1 inch   . aspirin chewable tablet 324 mg       <  30 min minutes time spent preparing d/c summary, including direct face-face patient Time, contact with consultants, family and care coordination   Signed: Rhetta Mura 04/19/2012, 1:10 PM

## 2012-04-19 NOTE — Progress Notes (Signed)
Discharged to home ambulatory, no complaints of any pain or discomfort, PIV removed no s/s  Of infiltration, no swelling noted. Discharged instruction and follow up appointment done and given to patient.

## 2012-05-11 ENCOUNTER — Ambulatory Visit: Payer: Self-pay | Admitting: Gastroenterology

## 2012-05-13 ENCOUNTER — Encounter: Payer: Self-pay | Admitting: *Deleted

## 2012-05-16 ENCOUNTER — Encounter: Payer: Self-pay | Admitting: Nurse Practitioner

## 2012-05-16 ENCOUNTER — Other Ambulatory Visit: Payer: Self-pay | Admitting: Nurse Practitioner

## 2012-05-16 ENCOUNTER — Ambulatory Visit (INDEPENDENT_AMBULATORY_CARE_PROVIDER_SITE_OTHER): Payer: BC Managed Care – PPO | Admitting: Nurse Practitioner

## 2012-05-16 VITALS — BP 132/79 | HR 60

## 2012-05-16 DIAGNOSIS — R079 Chest pain, unspecified: Secondary | ICD-10-CM

## 2012-05-16 DIAGNOSIS — R0789 Other chest pain: Secondary | ICD-10-CM

## 2012-05-16 NOTE — Patient Instructions (Signed)
We will see you back as needed.  Monitor BP at home.  Continue to exercise.  Call the [facility] office at [phone] if you have any questions, problems or concerns.

## 2012-05-16 NOTE — Procedures (Signed)
Exercise Treadmill Test  Pre-Exercise Testing Evaluation Rhythm: normal sinus  Rate: 60   PR:  .14 QRS:  .08  QT:  .40 QTc: .40     Test  Exercise Tolerance Test Ordering MD: Olga Millers, MD  Interpreting MD: Norma Fredrickson, NP  Unique Test No: 1  Treadmill:  1  Indication for ETT: chest pain - rule out ischemia  Contraindication to ETT: No   Stress Modality: exercise - treadmill  Cardiac Imaging Performed: non   Protocol: standard Bruce - maximal  Max BP:  196/94  Max MPHR (bpm):  170 85% MPR (bpm):  144  MPHR obtained (bpm):  150 % MPHR obtained:  88%  Reached 85% MPHR (min:sec):  8:25 Total Exercise Time (min-sec):  9:00  Workload in METS:  10.1 Borg Scale: 13  Reason ETT Terminated:  desired heart rate attained    ST Segment Analysis At Rest: normal ST segments - no evidence of significant ST depression With Exercise: no evidence of significant ST depression  Other Information Arrhythmia:  No Angina during ETT:  absent (0) Quality of ETT:  diagnostic  ETT Interpretation:  normal - no evidence of ischemia by ST analysis  Comments: Patient presents today for routine GXT. Was recently hospitalized with atypical chest pain. Had negative enzymes and negative echo. Felt to be more musculoskeletal in origin. Has had no recurrence since his hospitalization. Currently feels good. BP at home has been good per his report.   Exercised today on the standard Bruce protocol for a total of 9 minutes. Good exercise tolerance. Adequate BP response. Clinically negative for chest pain. EKG negative for ischemia. No arrhythmia noted.   Recommendations: Continue with risk factor modification.  No change in medicines. Will see back prn. Patient is agreeable to this plan and will call if any problems develop in the interim.

## 2014-02-04 IMAGING — CR DG CHEST 1V PORT
1 series · 1 of 1 positions shown · non-contrast
Comparison: None.

CLINICAL DATA: Chest pain

PORTABLE CHEST - 1 VIEW

[AP]
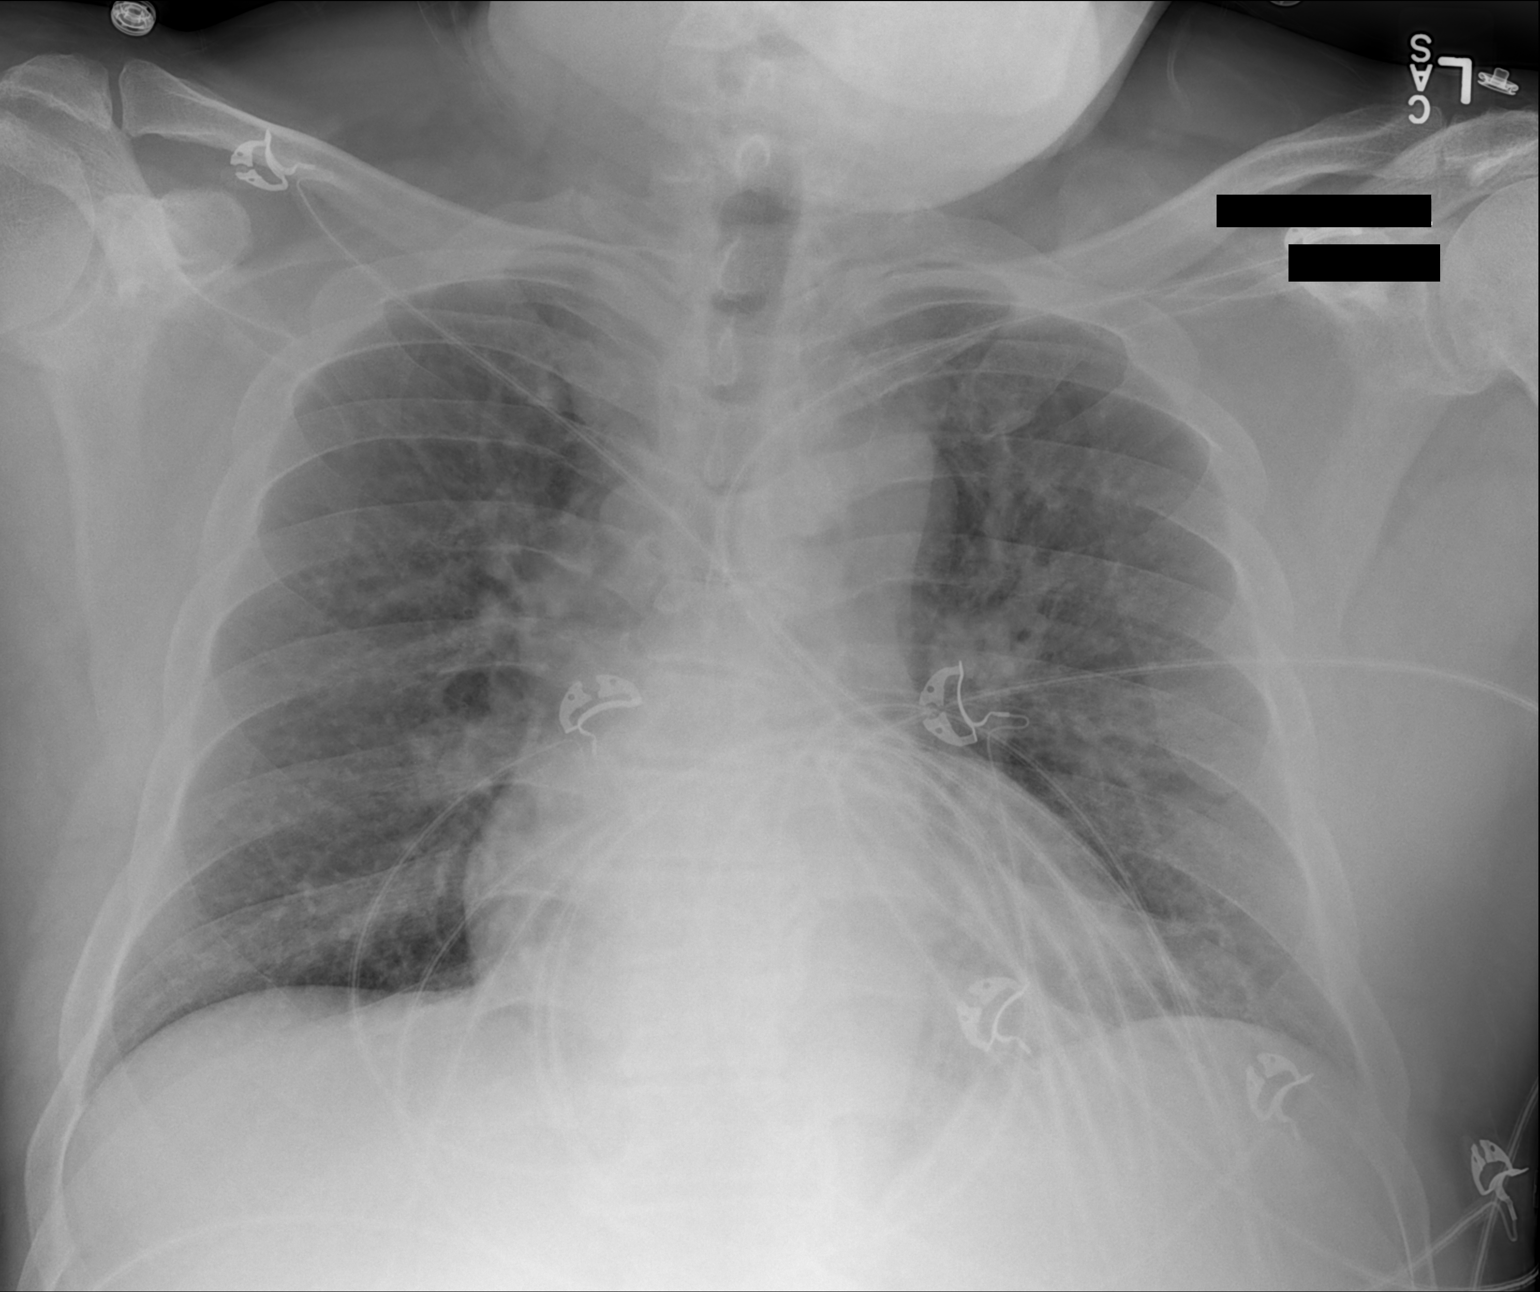

[1 of 1 positions shown; findings below may reference images not displayed]

FINDINGS: Prominent cardiomediastinal contours.  Central vascular
congestion.  Mild interstitial prominence.  No pneumothorax.  No
definite pleural effusion.  No focal consolidation.  No acute
osseous finding.
IMPRESSION: Prominent cardiomediastinal contours and central vascular
congestion.

Question mild interstitial edema versus atypical infection.

## 2014-04-04 ENCOUNTER — Emergency Department: Payer: Self-pay | Admitting: Internal Medicine

## 2014-04-04 LAB — CBC
HCT: 44.4 % (ref 40.0–52.0)
HGB: 13.9 g/dL (ref 13.0–18.0)
MCH: 20.3 pg — ABNORMAL LOW (ref 26.0–34.0)
MCHC: 31.3 g/dL — ABNORMAL LOW (ref 32.0–36.0)
MCV: 65 fL — AB (ref 80–100)
Platelet: 281 10*3/uL (ref 150–440)
RBC: 6.85 10*6/uL — AB (ref 4.40–5.90)
RDW: 15.9 % — ABNORMAL HIGH (ref 11.5–14.5)
WBC: 5.6 10*3/uL (ref 3.8–10.6)

## 2014-04-04 LAB — TROPONIN I

## 2014-04-04 LAB — BASIC METABOLIC PANEL
Anion Gap: 7 (ref 7–16)
BUN: 15 mg/dL (ref 7–18)
CALCIUM: 8.6 mg/dL (ref 8.5–10.1)
CHLORIDE: 109 mmol/L — AB (ref 98–107)
Co2: 28 mmol/L (ref 21–32)
Creatinine: 1.17 mg/dL (ref 0.60–1.30)
Glucose: 96 mg/dL (ref 65–99)
Osmolality: 288 (ref 275–301)
Potassium: 3.7 mmol/L (ref 3.5–5.1)
Sodium: 144 mmol/L (ref 136–145)

## 2014-04-06 ENCOUNTER — Ambulatory Visit: Payer: Self-pay | Admitting: Family Medicine

## 2014-04-24 ENCOUNTER — Ambulatory Visit: Payer: Self-pay | Admitting: Family Medicine

## 2014-12-17 ENCOUNTER — Encounter: Payer: Self-pay | Admitting: Nurse Practitioner

## 2014-12-17 ENCOUNTER — Ambulatory Visit (INDEPENDENT_AMBULATORY_CARE_PROVIDER_SITE_OTHER): Payer: BLUE CROSS/BLUE SHIELD | Admitting: Nurse Practitioner

## 2014-12-17 VITALS — BP 120/86 | HR 57 | Ht 70.0 in | Wt 280.4 lb

## 2014-12-17 DIAGNOSIS — I10 Essential (primary) hypertension: Secondary | ICD-10-CM

## 2014-12-17 DIAGNOSIS — Z01818 Encounter for other preprocedural examination: Secondary | ICD-10-CM

## 2014-12-17 DIAGNOSIS — E785 Hyperlipidemia, unspecified: Secondary | ICD-10-CM

## 2014-12-17 NOTE — Progress Notes (Signed)
CARDIOLOGY OFFICE NOTE  Date:  12/17/2014    Marc Stevens Date of Birth: [birth year] Medical Record #[id number]  PCP:  LADA, Satira Anis, MD  Cardiologist:  Stanford Breed  Chief Complaint  Patient presents with  . Pre-op Exam    Surgical clearance - seen in the past by Dr. Stanford Breed.      History of Present Illness: Marc Stevens is a 54 y.o. male who presents today for a pre op clearance.   He was seen back in July of 2013 by Dr. Stanford Breed for chest pain. Has HTN, HLD, OSA and past tobacco abuse. No known CAD. Echo satisfactory at that time. Outpatient exercise stress test recommended - does not appear that this was ever completed.   Comes in today. Here alone. Referred by his PCP for pre op clearance and ? EKG changes. Needing back surgery. Says he is here for a stress test. He says that he will have some DOE - especially if he goes up hills. No real chest pain. Says his BP is controlled. Taking his medicines. Not smoking. He denies a FH of CAD. Not lightheaded or dizzy. No real exercise due to his back. According to his medicine list - he is no longer on statin therapy.    Past Medical History  Diagnosis Date  . Hypertension   . OSA (obstructive sleep apnea)   . History of tobacco abuse     quit 2008  . Chest pain 04/18/2012    Past Surgical History  Procedure Laterality Date  . Rotator cuff repair  2007    Left  . Doppler echocardiography  2013     Medications: Current Outpatient Prescriptions  Medication Sig Dispense Refill  . amLODipine (NORVASC) 10 MG tablet Take 10 mg by mouth daily.    Marland Kitchen aspirin EC 81 MG tablet Take 81 mg by mouth daily.    . hydrochlorothiazide (HYDRODIURIL) 25 MG tablet Take 25 mg by mouth daily.    Marland Kitchen losartan (COZAAR) 100 MG tablet Take 100 mg by mouth daily.     No current facility-administered medications for this visit.    Allergies: No Known Allergies  Social History: The patient  reports that he quit smoking about 7  years ago. He does not have any smokeless tobacco history on file. He reports that he drinks alcohol. He reports that he does not use illicit drugs.   Family History: The patient's family history includes Dementia in his mother; Hypertension in his mother. He denies a history of MI with his father.   Review of Systems: Please see the history of present illness.   Otherwise, the review of systems is positive for weight gain and back pain.    All other systems are reviewed and negative.   Physical Exam: VS:  BP 120/86 mmHg  Pulse 57  Ht 5\' 10"  (1.778 m)  Wt 280 lb 6.4 oz (127.189 kg)  BMI 40.23 kg/m2 .  BMI Body mass index is 40.23 kg/(m^2).  Wt Readings from Last 3 Encounters:  12/17/14 280 lb 6.4 oz (127.189 kg)  04/18/12 258 lb 6.1 oz (117.2 kg)    General: Pleasant. Well developed, well nourished and in no acute distress. He is obese.  His weight is up 22 pounds since he was last here.  HEENT: Normal. Neck: Supple, no JVD, carotid bruits, or masses noted.  Cardiac: Regular rate and rhythm. No murmurs, rubs, or gallops. No edema.  Respiratory:  Lungs are clear to auscultation bilaterally  with normal work of breathing.  GI: Soft and nontender.  MS: No deformity or atrophy. Gait and ROM intact. Skin: Warm and dry. Color is normal.  Neuro:  Strength and sensation are intact and no gross focal deficits noted.  Psych: Alert, appropriate and with normal affect.   LABORATORY DATA:  EKG:  EKG is ordered today. This demonstrates NSR. He has no acute changes.   Lab Results  Component Value Date   WBC 6.7 04/18/2012   HGB 14.0 04/18/2012   HCT 44.8 04/18/2012   PLT 232 04/18/2012   GLUCOSE 109* 04/17/2012   CHOL 139 04/18/2012   TRIG 120 04/18/2012   HDL 51 04/18/2012   LDLCALC 64 04/18/2012   ALT 29 04/17/2012   AST 26 04/17/2012   NA 138 04/17/2012   K 3.7 04/17/2012   CL 103 04/17/2012   CREATININE 1.18 04/18/2012   BUN 14 04/17/2012   CO2 25 04/17/2012   TSH 1.225  04/18/2012   INR 0.90 04/17/2012   HGBA1C 5.9* 04/18/2012    BNP (last 3 results) No results for input(s): BNP in the last 8760 hours.  ProBNP (last 3 results) No results for input(s): PROBNP in the last 8760 hours.   Other Studies Reviewed Today:  Echo Study Conclusions from 04/2012  Left ventricle: Wall thickness was increased in a pattern of mild LVH.  Systolic function was normal. The estimated ejection fraction was in the range of 50% to 55%. Left ventricular diastolic function parameters were normal.   Assessment/Plan: 1. Pre op clearance - appears to be a satisfactory candidate - some DOE noted - no chest pain - will arrange for GXT - he says he thinks he can walk on the treadmill.  2. HTN -  BP ok here.   3. HLD -  No longer on statin - labs checked by PCP  4. OSA  5. Past smoker - resolved.   Current medicines are reviewed with the patient today.  The patient does not have concerns regarding medicines other than what has been noted above.  The following changes have been made:  See above.  Labs/ tests ordered today include:    Orders Placed This Encounter  Procedures  . EKG 12-Lead  . Exercise Tolerance Test     Disposition:   FU with Dr. Stanford Breed prn.   Patient is agreeable to this plan and will call if any problems develop in the interim.   Signed: Burtis Junes, RN, ANP-C 12/17/2014 8:33 AM  [facility] [address] [place], [place]  [address] Phone: [phone] Fax: [phone]

## 2014-12-17 NOTE — Patient Instructions (Signed)
Stay on your current medicines  We will arrange for a GXT - ok to do at [facility]   I will send a note to your primary doctor and Dr. Tonita Cong  Call the [place] office at [phone] if you have any questions, problems or concerns.

## 2015-01-09 ENCOUNTER — Ambulatory Visit (HOSPITAL_COMMUNITY): Payer: BLUE CROSS/BLUE SHIELD | Attending: Family Medicine

## 2015-01-09 ENCOUNTER — Other Ambulatory Visit (HOSPITAL_COMMUNITY): Payer: Self-pay | Admitting: *Deleted

## 2015-01-09 ENCOUNTER — Telehealth: Payer: Self-pay | Admitting: Nurse Practitioner

## 2015-01-09 DIAGNOSIS — E785 Hyperlipidemia, unspecified: Secondary | ICD-10-CM

## 2015-01-09 DIAGNOSIS — Z01818 Encounter for other preprocedural examination: Secondary | ICD-10-CM

## 2015-01-09 DIAGNOSIS — I10 Essential (primary) hypertension: Secondary | ICD-10-CM

## 2015-01-09 NOTE — Telephone Encounter (Signed)
Received call from [facility] in scheduling at hospital.  She has reviewed with Sharene Skeans and pt needs to be scheduled for GXT. Jenny Reichmann has scheduled this for tomorrow and pt has been contacted

## 2015-01-09 NOTE — Telephone Encounter (Signed)
New message      Calling to clarify what test Marc Stevens ordered to clear pt for surgery.  She scheduled a CPX (cardio pulm stress test).  Now she thinks it may have bee a regular treadmill test.  Please call

## 2015-01-09 NOTE — Telephone Encounter (Signed)
Chart reviewed and GXT was ordered.  I spoke with Jenny Reichmann and CPX was done.  She is asking if OK to use CPX instead and if so needs order for this.  CPX results cannot be resulted until order for CPX put in.  I reviewed with Truitt Merle, NP and CPX may give information needed for surgical clearance but she cannot justify ordering this as GXT was requested.  I returned call to [facility] and she will have supervisor return call to discuss. If not able to use CPX information pt will need to be scheduled for GXT.  I reviewed with Sharene Skeans at hospital and he will review with Dr.Crenshaw regarding testing needed.

## 2015-01-10 ENCOUNTER — Ambulatory Visit (HOSPITAL_COMMUNITY)
Admission: RE | Admit: 2015-01-10 | Discharge: 2015-01-10 | Disposition: A | Discharge status: Home / Self Care | Payer: BLUE CROSS/BLUE SHIELD | Source: Ambulatory Visit | Attending: Nurse Practitioner | Admitting: Nurse Practitioner

## 2015-01-10 ENCOUNTER — Other Ambulatory Visit: Payer: Self-pay

## 2015-01-10 DIAGNOSIS — Z01818 Encounter for other preprocedural examination: Secondary | ICD-10-CM

## 2015-01-10 DIAGNOSIS — R0789 Other chest pain: Secondary | ICD-10-CM | POA: Insufficient documentation

## 2015-01-10 DIAGNOSIS — E785 Hyperlipidemia, unspecified: Secondary | ICD-10-CM

## 2015-01-10 DIAGNOSIS — I10 Essential (primary) hypertension: Secondary | ICD-10-CM

## 2015-01-11 ENCOUNTER — Telehealth: Payer: Self-pay | Admitting: *Deleted

## 2015-01-11 NOTE — Telephone Encounter (Signed)
-----   Message from Burtis Junes, NP sent at 01/11/2015  9:56 AM EDT ----- Can you call and let him know that his GXT was ok - did have elevated BP response - needs to monitor his BP as outpatient.  I do not know who the physician is to send his report to  Cecille Rubin ----- Message -----    From: Earvin Hansen    Sent: 01/10/2015   1:36 PM      To: Burtis Junes, NP  GXT today Rip Harbour

## 2015-01-11 NOTE — Telephone Encounter (Signed)
Advised patient of results and to monitor blood pressure at home Verbalized understanding  Will forward to PCP

## 2015-01-11 NOTE — Telephone Encounter (Signed)
Phone call from Dr. Haroldine Laws on 01/10/2015 - we discussed that patient really has no indication for CPX. He did look at the data - PFTs normal. Functional capacity very good. No obvious ischemia.   Patient had GXT on 01/10/2015 CPX data has been discarded.   Burtis Junes, RN, [facility] [address] [place], [place]  [address] [phone]

## 2015-01-16 ENCOUNTER — Telehealth: Payer: Self-pay | Admitting: Nurse Practitioner

## 2015-01-16 NOTE — Telephone Encounter (Signed)
Has been faxed to PCP Called and verified fax received at Dr Reather Littler office

## 2015-01-16 NOTE — Telephone Encounter (Signed)
New message        Dr. Ardyth Gal @ [facility] phone: [phone] ext [phone]   please send results to this doctor so pt can have surgery scheduled   [phone] Dr. lada would also like results [place] to follow pt care

## 2015-01-16 NOTE — Telephone Encounter (Signed)
Patient aware.

## 2015-01-19 ENCOUNTER — Ambulatory Visit: Payer: Self-pay | Admitting: Orthopedic Surgery

## 2015-01-21 NOTE — Patient Instructions (Signed)
Marc Stevens  01/21/2015   Your procedure is scheduled on:  01/30/2015    Report to [facility] Main  Entrance and follow signs to               [place] at      [place].  Call this number if you have problems the morning of surgery [phone]   Remember:  Do not eat food or drink liquids :After Midnight.     Take these medicines the morning of surgery with A SIP OF WATER:  Amlodipine ( Norvasc)                                You may not have any metal on your body including hair pins and              piercings  Do not wear jewelry,, lotions, powders or perfumes., deodorant.                         Men may shave face and neck.   Do not bring valuables to the hospital. [place].  Contacts, dentures or bridgework may not be worn into surgery.  Leave suitcase in the car. After surgery it may be brought to your room.         Special Instructions: coughing and deep breathing exercises, leg exercises              Please read over the following fact sheets you were given: _____________________________________________________________________             [facility] - Preparing for Surgery Before surgery, you can play an important role.  Because skin is not sterile, your skin needs to be as free of germs as possible.  You can reduce the number of germs on your skin by washing with CHG (chlorahexidine gluconate) soap before surgery.  CHG is an antiseptic cleaner which kills germs and bonds with the skin to continue killing germs even after washing. Please DO NOT use if you have an allergy to CHG or antibacterial soaps.  If your skin becomes reddened/irritated stop using the CHG and inform your nurse when you arrive at Short Stay. Do not shave (including legs and underarms) for at least 48 hours prior to the first CHG shower.  You may shave your face/neck. Please follow these instructions  carefully:  1.  Shower with CHG Soap the night before surgery and the  morning of Surgery.  2.  If you choose to wash your hair, wash your hair first as usual with your  normal  shampoo.  3.  After you shampoo, rinse your hair and body thoroughly to remove the  shampoo.                           4.  Use CHG as you would any other liquid soap.  You can apply chg directly  to the skin and wash                       Gently with a scrungie or clean washcloth.  5.  Apply the CHG Soap to your body ONLY  FROM THE NECK DOWN.   Do not use on face/ open                           Wound or open sores. Avoid contact with eyes, ears mouth and genitals (private parts).                       Wash face,  Genitals (private parts) with your normal soap.             6.  Wash thoroughly, paying special attention to the area where your surgery  will be performed.  7.  Thoroughly rinse your body with warm water from the neck down.  8.  DO NOT shower/wash with your normal soap after using and rinsing off  the CHG Soap.                9.  Pat yourself dry with a clean towel.            10.  Wear clean pajamas.            11.  Place clean sheets on your bed the night of your first shower and do not  sleep with pets. Day of Surgery : Do not apply any lotions/deodorants the morning of surgery.  Please wear clean clothes to the hospital/surgery center.  FAILURE TO FOLLOW THESE INSTRUCTIONS MAY RESULT IN THE CANCELLATION OF YOUR SURGERY PATIENT SIGNATURE_________________________________  NURSE SIGNATURE__________________________________  ________________________________________________________________________

## 2015-01-22 ENCOUNTER — Ambulatory Visit (HOSPITAL_COMMUNITY)
Admission: RE | Admit: 2015-01-22 | Discharge: 2015-01-22 | Disposition: A | Discharge status: Home / Self Care | Payer: BLUE CROSS/BLUE SHIELD | Source: Ambulatory Visit | Attending: Orthopedic Surgery | Admitting: Orthopedic Surgery

## 2015-01-22 ENCOUNTER — Ambulatory Visit: Payer: Self-pay | Admitting: Orthopedic Surgery

## 2015-01-22 ENCOUNTER — Encounter (HOSPITAL_COMMUNITY)
Admission: RE | Admit: 2015-01-22 | Discharge: 2015-01-22 | Disposition: A | Discharge status: Home / Self Care | Payer: BLUE CROSS/BLUE SHIELD | Source: Ambulatory Visit | Attending: Specialist | Admitting: Specialist

## 2015-01-22 ENCOUNTER — Encounter (HOSPITAL_COMMUNITY): Payer: Self-pay

## 2015-01-22 DIAGNOSIS — M5136 Other intervertebral disc degeneration, lumbar region: Secondary | ICD-10-CM | POA: Diagnosis not present

## 2015-01-22 DIAGNOSIS — Z01818 Encounter for other preprocedural examination: Secondary | ICD-10-CM | POA: Diagnosis present

## 2015-01-22 DIAGNOSIS — M5126 Other intervertebral disc displacement, lumbar region: Secondary | ICD-10-CM

## 2015-01-22 LAB — CBC
HCT: 46.9 % (ref 39.0–52.0)
HEMOGLOBIN: 14.3 g/dL (ref 13.0–17.0)
MCH: 20.1 pg — AB (ref 26.0–34.0)
MCHC: 30.5 g/dL (ref 30.0–36.0)
MCV: 65.9 fL — ABNORMAL LOW (ref 78.0–100.0)
PLATELETS: 266 10*3/uL (ref 150–400)
RBC: 7.12 MIL/uL — AB (ref 4.22–5.81)
RDW: 15.7 % — AB (ref 11.5–15.5)
WBC: 4.9 10*3/uL (ref 4.0–10.5)

## 2015-01-22 LAB — BASIC METABOLIC PANEL
Anion gap: 9 (ref 5–15)
BUN: 18 mg/dL (ref 6–23)
CHLORIDE: 102 mmol/L (ref 96–112)
CO2: 29 mmol/L (ref 19–32)
CREATININE: 1.2 mg/dL (ref 0.50–1.35)
Calcium: 9.3 mg/dL (ref 8.4–10.5)
GFR calc non Af Amer: 68 mL/min — ABNORMAL LOW (ref >90)
GFR, EST AFRICAN AMERICAN: 79 mL/min — AB (ref >90)
Glucose, Bld: 111 mg/dL — ABNORMAL HIGH (ref 70–99)
Potassium: 3.7 mmol/L (ref 3.5–5.1)
SODIUM: 140 mmol/L (ref 135–145)

## 2015-01-22 LAB — SURGICAL PCR SCREEN
MRSA, PCR: NEGATIVE
STAPHYLOCOCCUS AUREUS: POSITIVE — AB

## 2015-01-22 NOTE — Progress Notes (Signed)
ETT- 01/10/2015 EPIC  12/17/14 EKG- EPIC  12/17/2014 LOV with Cardiology NP in [facility]- surgical clearance in note.

## 2015-01-22 NOTE — H&P (Signed)
Marc Stevens is an 54 y.o. male.   Chief Complaint: back and R leg pain HPI: The patient is a 54 year old male who presents today for follow up of their back. The patient is being followed for their back pain. They are now month(s) out from when symptoms began. Symptoms reported today include: pain (low back), while the patient does not report symptoms of: leg pain. Current treatment includes: home exercise program (stretches), NSAIDs (Advil) and ice. The patient reports their current pain level to be moderate to severe.  Past Medical History  Diagnosis Date  . Hypertension   . OSA (obstructive sleep apnea)   . History of tobacco abuse     quit 2008  . Chest pain 04/18/2012    Past Surgical History  Procedure Laterality Date  . Rotator cuff repair  2007    Left  . Doppler echocardiography  2013    Family History  Problem Relation Age of Onset  . Dementia Mother   . Hypertension Mother    Social History:  reports that he quit smoking about 7 years ago. He does not have any smokeless tobacco history on file. He reports that he drinks alcohol. He reports that he does not use illicit drugs.  Allergies: No Known Allergies   (Not in a hospital admission)  No results found for this or any previous visit (from the past 48 hour(s)). No results found.  Review of Systems  Constitutional: Negative.   HENT: Negative.   Eyes: Negative.   Respiratory: Negative.   Cardiovascular: Negative.   Gastrointestinal: Negative.   Genitourinary: Negative.   Musculoskeletal: Positive for back pain.  Skin: Negative.   Neurological: Positive for focal weakness.  Psychiatric/Behavioral: Negative.     There were no vitals taken for this visit. Physical Exam  Constitutional: He is oriented to person, place, and time. He appears well-developed and well-nourished.  HENT:  Head: Normocephalic and atraumatic.  Eyes: Conjunctivae and EOM are normal. Pupils are equal, round, and reactive to  light.  Neck: Normal range of motion. Neck supple.  Cardiovascular: Normal rate and regular rhythm.   Respiratory: Effort normal and breath sounds normal.  GI: Soft. Bowel sounds are normal.  Musculoskeletal:  Straight leg raise on the right produces buttock, thigh and calf pain. On the left it is negative. Trace EHL weakness is noted. He has difficulty ambulating. Radiates into the buttock and down into the hip significantly.  Neurological: He is alert and oriented to person, place, and time. He has normal reflexes.  Skin: Skin is warm and dry.  Psychiatric: He has a normal mood and affect.    MRI of his lumbar spine by Dr. Nelva Bush back in December demonstrates severe stenosis at L4-5 and disc herniation at L4-5 to the right. He has mainly right sided instead of left sided symptoms.  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  We had a discussion concerning current pathology, relevant anatomy and treatment options. We discussed his back is bothering him more than his shoulder at this point. We discussed decompression at L4-5.  I had an extensive discussion of the risks and benefits of the lumbar decompression with the patient including bleeding, infection, damage to neurovascular structures, epidural fibrosis, CSF leak requiring repair. We also discussed increase in pain, adjacent segment disease, recurrent disc herniation, need for future surgery including repeat decompression and/or fusion. We also discussed risks of  postoperative hematoma, paralysis, anesthetic complications including DVT, PE, death, cardiopulmonary dysfunction. In addition, the perioperative and postoperative courses were discussed in detail including the rehabilitative time and return to functional activity and work. I provided the patient with an illustrated handout and utilized the appropriate  surgical models.  We did discuss epidural and had offered that prior to that. He has declined epidural. He does not want to proceed with an epidural. He would like to proceed with decompression. In terms of his shoulder we discussed rotator cuff repair.  Continue with his current pain medicine. He indicates he has been taking steroids. The lumbar situation that has been bothering him for a significant period of time. He does have severe stenosis noted on his MRI. He has had a couple of steroid Dosepaks. He is also treated by his primary care physician just prior to this. This was back in July 2015. He saw Dr. Golden Pop in [place], [place] and has had extensive conservative treatment. Discussed he could live with his symptoms as they are now. 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 We obtained three view x-rays today. Disc degeneration at L3-4, L4-5 and L5-S1 but no instability on flexion and extension. He has residual back pain following this. He is actually having it in the hip, buttock and leg. He has also had local trigger point injections as well. He has severe stenosis at L4-5. It is affecting his ability to walk for long distances. He gets pain into right leg and has to sit down. He does get relief with sitting. If he has any further questions or concerns he should feel free to call me on that. Out of work. We discussed short and long term disability in terms of proceeding with the rotator cuff repair following the decompression. Again he has had much more problems with his back, leg, buttock and hip than he has with his shoulder today. Avoid overhead lifting and heavy lifting. We will obtain preoperative clearance.  Plan microlumbar decompression L4-5  Serine Kea M. PA-C for Dr. Tonita Cong 01/22/2015, 8:13 AM

## 2015-01-29 MED ORDER — DEXTROSE 5 % IV SOLN
3.0000 g | INTRAVENOUS | Status: AC
  Administered 2015-01-30: 3 g via INTRAVENOUS
  Filled 2015-01-29: qty 3000

## 2015-01-30 ENCOUNTER — Ambulatory Visit (HOSPITAL_COMMUNITY): Payer: BLUE CROSS/BLUE SHIELD

## 2015-01-30 ENCOUNTER — Ambulatory Visit (HOSPITAL_COMMUNITY): Payer: BLUE CROSS/BLUE SHIELD | Admitting: Certified Registered Nurse Anesthetist

## 2015-01-30 ENCOUNTER — Encounter (HOSPITAL_COMMUNITY): Payer: Self-pay | Admitting: *Deleted

## 2015-01-30 ENCOUNTER — Ambulatory Visit (HOSPITAL_COMMUNITY)
Admission: RE | Admit: 2015-01-30 | Discharge: 2015-02-01 | Disposition: A | Discharge status: Home / Self Care | Payer: BLUE CROSS/BLUE SHIELD | Source: Ambulatory Visit | Attending: Specialist | Admitting: Specialist

## 2015-01-30 ENCOUNTER — Encounter (HOSPITAL_COMMUNITY)
Admission: RE | Disposition: A | Discharge status: Home / Self Care | Payer: Self-pay | Source: Ambulatory Visit | Attending: Specialist

## 2015-01-30 DIAGNOSIS — Z6838 Body mass index (BMI) 38.0-38.9, adult: Secondary | ICD-10-CM | POA: Insufficient documentation

## 2015-01-30 DIAGNOSIS — M48061 Spinal stenosis, lumbar region without neurogenic claudication: Secondary | ICD-10-CM | POA: Diagnosis present

## 2015-01-30 DIAGNOSIS — Z419 Encounter for procedure for purposes other than remedying health state, unspecified: Secondary | ICD-10-CM

## 2015-01-30 DIAGNOSIS — M549 Dorsalgia, unspecified: Secondary | ICD-10-CM | POA: Diagnosis present

## 2015-01-30 DIAGNOSIS — M4806 Spinal stenosis, lumbar region: Secondary | ICD-10-CM | POA: Diagnosis not present

## 2015-01-30 DIAGNOSIS — F1099 Alcohol use, unspecified with unspecified alcohol-induced disorder: Secondary | ICD-10-CM | POA: Insufficient documentation

## 2015-01-30 DIAGNOSIS — M5126 Other intervertebral disc displacement, lumbar region: Secondary | ICD-10-CM | POA: Insufficient documentation

## 2015-01-30 DIAGNOSIS — E669 Obesity, unspecified: Secondary | ICD-10-CM | POA: Insufficient documentation

## 2015-01-30 DIAGNOSIS — Z87891 Personal history of nicotine dependence: Secondary | ICD-10-CM | POA: Diagnosis not present

## 2015-01-30 DIAGNOSIS — G4733 Obstructive sleep apnea (adult) (pediatric): Secondary | ICD-10-CM | POA: Insufficient documentation

## 2015-01-30 DIAGNOSIS — I10 Essential (primary) hypertension: Secondary | ICD-10-CM | POA: Insufficient documentation

## 2015-01-30 HISTORY — PX: LUMBAR LAMINECTOMY/DECOMPRESSION MICRODISCECTOMY: SHX5026

## 2015-01-30 SURGERY — LUMBAR LAMINECTOMY/DECOMPRESSION MICRODISCECTOMY 1 LEVEL
Anesthesia: General | Site: Back

## 2015-01-30 MED ORDER — NEOSTIGMINE METHYLSULFATE 10 MG/10ML IV SOLN
INTRAVENOUS | Status: DC | PRN
  Administered 2015-01-30: 4 mg via INTRAVENOUS

## 2015-01-30 MED ORDER — BISACODYL 5 MG PO TBEC: 5.0000 mg | DELAYED_RELEASE_TABLET | Freq: Every day | ORAL | Status: DC | PRN

## 2015-01-30 MED ORDER — HYDROMORPHONE HCL 1 MG/ML IJ SOLN
0.5000 mg | INTRAMUSCULAR | Status: DC | PRN
  Administered 2015-01-30 – 2015-01-31 (×4): 1 mg via INTRAVENOUS
  Filled 2015-01-30 (×4): qty 1

## 2015-01-30 MED ORDER — DOCUSATE SODIUM 100 MG PO CAPS
100.0000 mg | ORAL_CAPSULE | Freq: Two times a day (BID) | ORAL | Status: DC
  Administered 2015-01-30 – 2015-02-01 (×5): 100 mg via ORAL

## 2015-01-30 MED ORDER — ACETAMINOPHEN 325 MG PO TABS
650.0000 mg | ORAL_TABLET | ORAL | Status: DC | PRN
  Administered 2015-02-01: 650 mg via ORAL
  Filled 2015-01-30: qty 2

## 2015-01-30 MED ORDER — ONDANSETRON HCL 4 MG/2ML IJ SOLN
INTRAMUSCULAR | Status: DC | PRN
  Administered 2015-01-30: 4 mg via INTRAVENOUS

## 2015-01-30 MED ORDER — ROCURONIUM BROMIDE 100 MG/10ML IV SOLN
INTRAVENOUS | Status: DC | PRN
  Administered 2015-01-30: 40 mg via INTRAVENOUS
  Administered 2015-01-30: 5 mg via INTRAVENOUS

## 2015-01-30 MED ORDER — OXYCODONE-ACETAMINOPHEN 5-325 MG PO TABS
1.0000 | ORAL_TABLET | ORAL | Status: DC | PRN
  Administered 2015-01-31 (×2): 2 via ORAL
  Filled 2015-01-30 (×2): qty 2

## 2015-01-30 MED ORDER — OXYCODONE-ACETAMINOPHEN 5-325 MG PO TABS: 1.0000 | ORAL_TABLET | ORAL | Status: DC | PRN

## 2015-01-30 MED ORDER — PROPOFOL 10 MG/ML IV BOLUS
INTRAVENOUS | Status: DC | PRN
  Administered 2015-01-30: 200 mg via INTRAVENOUS

## 2015-01-30 MED ORDER — FENTANYL CITRATE 0.05 MG/ML IJ SOLN
INTRAMUSCULAR | Status: AC
  Filled 2015-01-30: qty 5

## 2015-01-30 MED ORDER — CLINDAMYCIN PHOSPHATE 900 MG/50ML IV SOLN
900.0000 mg | INTRAVENOUS | Status: AC
  Administered 2015-01-30: 900 mg via INTRAVENOUS

## 2015-01-30 MED ORDER — NEOSTIGMINE METHYLSULFATE 10 MG/10ML IV SOLN
INTRAVENOUS | Status: AC
Start: 2015-01-30 — End: 2015-01-30
  Filled 2015-01-30: qty 1

## 2015-01-30 MED ORDER — MAGNESIUM CITRATE PO SOLN: 1.0000 | Freq: Once | ORAL | Status: AC | PRN

## 2015-01-30 MED ORDER — PHENOL 1.4 % MT LIQD
1.0000 | OROMUCOSAL | Status: DC | PRN
  Filled 2015-01-30: qty 177

## 2015-01-30 MED ORDER — LIDOCAINE HCL (CARDIAC) 20 MG/ML IV SOLN
INTRAVENOUS | Status: AC
  Filled 2015-01-30: qty 5

## 2015-01-30 MED ORDER — GLYCOPYRROLATE 0.2 MG/ML IJ SOLN
INTRAMUSCULAR | Status: AC
  Filled 2015-01-30: qty 3

## 2015-01-30 MED ORDER — EPHEDRINE SULFATE 50 MG/ML IJ SOLN
INTRAMUSCULAR | Status: AC
  Filled 2015-01-30: qty 1

## 2015-01-30 MED ORDER — SENNOSIDES-DOCUSATE SODIUM 8.6-50 MG PO TABS: 1.0000 | ORAL_TABLET | Freq: Every evening | ORAL | Status: DC | PRN

## 2015-01-30 MED ORDER — LIDOCAINE HCL (CARDIAC) 20 MG/ML IV SOLN
INTRAVENOUS | Status: DC | PRN
  Administered 2015-01-30: 100 mg via INTRAVENOUS

## 2015-01-30 MED ORDER — CEFAZOLIN SODIUM-DEXTROSE 2-3 GM-% IV SOLR
2.0000 g | Freq: Three times a day (TID) | INTRAVENOUS | Status: AC
  Administered 2015-01-30 – 2015-01-31 (×3): 2 g via INTRAVENOUS
  Filled 2015-01-30 (×3): qty 50

## 2015-01-30 MED ORDER — ONDANSETRON HCL 4 MG/2ML IJ SOLN: 4.0000 mg | INTRAMUSCULAR | Status: DC | PRN

## 2015-01-30 MED ORDER — LOSARTAN POTASSIUM 50 MG PO TABS
100.0000 mg | ORAL_TABLET | Freq: Every evening | ORAL | Status: DC
  Administered 2015-01-30 – 2015-01-31 (×2): 100 mg via ORAL
  Filled 2015-01-30 (×3): qty 2

## 2015-01-30 MED ORDER — ACETAMINOPHEN 650 MG RE SUPP: 650.0000 mg | RECTAL | Status: DC | PRN

## 2015-01-30 MED ORDER — NEOSTIGMINE METHYLSULFATE 10 MG/10ML IV SOLN
INTRAVENOUS | Status: AC
  Filled 2015-01-30: qty 1

## 2015-01-30 MED ORDER — PROPOFOL 10 MG/ML IV BOLUS
INTRAVENOUS | Status: AC
  Filled 2015-01-30: qty 20

## 2015-01-30 MED ORDER — BUPIVACAINE-EPINEPHRINE (PF) 0.5% -1:200000 IJ SOLN
INTRAMUSCULAR | Status: AC
  Filled 2015-01-30: qty 30

## 2015-01-30 MED ORDER — MIDAZOLAM HCL 2 MG/2ML IJ SOLN
INTRAMUSCULAR | Status: AC
  Filled 2015-01-30: qty 2

## 2015-01-30 MED ORDER — KCL IN DEXTROSE-NACL 20-5-0.45 MEQ/L-%-% IV SOLN
INTRAVENOUS | Status: DC
  Administered 2015-01-30: 14:00:00 via INTRAVENOUS
  Administered 2015-01-31: 75 mL/h via INTRAVENOUS
  Filled 2015-01-30 (×6): qty 1000

## 2015-01-30 MED ORDER — THROMBIN 5000 UNITS EX SOLR
CUTANEOUS | Status: DC | PRN
  Administered 2015-01-30: 10 mL via TOPICAL

## 2015-01-30 MED ORDER — ALUM & MAG HYDROXIDE-SIMETH 200-200-20 MG/5ML PO SUSP: 30.0000 mL | Freq: Four times a day (QID) | ORAL | Status: DC | PRN

## 2015-01-30 MED ORDER — ROCURONIUM BROMIDE 100 MG/10ML IV SOLN
INTRAVENOUS | Status: AC
  Filled 2015-01-30: qty 1

## 2015-01-30 MED ORDER — MENTHOL 3 MG MT LOZG: 1.0000 | LOZENGE | OROMUCOSAL | Status: DC | PRN

## 2015-01-30 MED ORDER — SODIUM CHLORIDE 0.9 % IR SOLN
Status: DC | PRN
  Administered 2015-01-30: 500 mL

## 2015-01-30 MED ORDER — DOCUSATE SODIUM 100 MG PO CAPS: 100.0000 mg | ORAL_CAPSULE | Freq: Two times a day (BID) | ORAL | Status: DC | PRN

## 2015-01-30 MED ORDER — GLYCOPYRROLATE 0.2 MG/ML IJ SOLN
INTRAMUSCULAR | Status: DC | PRN
  Administered 2015-01-30: 0.6 mg via INTRAVENOUS

## 2015-01-30 MED ORDER — CLINDAMYCIN PHOSPHATE 900 MG/50ML IV SOLN
900.0000 mg | Freq: Three times a day (TID) | INTRAVENOUS | Status: AC
  Administered 2015-01-30: 900 mg via INTRAVENOUS
  Filled 2015-01-30: qty 50

## 2015-01-30 MED ORDER — CLINDAMYCIN PHOSPHATE 900 MG/50ML IV SOLN
INTRAVENOUS | Status: AC
  Filled 2015-01-30: qty 50

## 2015-01-30 MED ORDER — ERGOCALCIFEROL 1.25 MG (50000 UT) PO CAPS
50000.0000 [IU] | ORAL_CAPSULE | ORAL | Status: DC
  Filled 2015-01-30: qty 1

## 2015-01-30 MED ORDER — ONDANSETRON HCL 4 MG/2ML IJ SOLN
INTRAMUSCULAR | Status: AC
  Filled 2015-01-30: qty 2

## 2015-01-30 MED ORDER — METHOCARBAMOL 500 MG PO TABS
500.0000 mg | ORAL_TABLET | Freq: Four times a day (QID) | ORAL | Status: DC | PRN
  Administered 2015-01-30 – 2015-02-01 (×6): 500 mg via ORAL
  Filled 2015-01-30 (×6): qty 1

## 2015-01-30 MED ORDER — HYDROMORPHONE HCL 1 MG/ML IJ SOLN: 0.2500 mg | INTRAMUSCULAR | Status: DC | PRN

## 2015-01-30 MED ORDER — HYDROCHLOROTHIAZIDE 25 MG PO TABS
25.0000 mg | ORAL_TABLET | Freq: Every morning | ORAL | Status: DC
  Administered 2015-01-30 – 2015-01-31 (×2): 25 mg via ORAL
  Filled 2015-01-30 (×3): qty 1

## 2015-01-30 MED ORDER — PHENYLEPHRINE HCL 10 MG/ML IJ SOLN
INTRAMUSCULAR | Status: DC | PRN
  Administered 2015-01-30 (×4): 40 ug via INTRAVENOUS

## 2015-01-30 MED ORDER — HYDROCODONE-ACETAMINOPHEN 5-325 MG PO TABS
1.0000 | ORAL_TABLET | ORAL | Status: DC | PRN
  Administered 2015-01-30 – 2015-01-31 (×3): 2 via ORAL
  Filled 2015-01-30 (×3): qty 2

## 2015-01-30 MED ORDER — AMLODIPINE BESYLATE 10 MG PO TABS
10.0000 mg | ORAL_TABLET | Freq: Every morning | ORAL | Status: DC
  Administered 2015-01-31: 10 mg via ORAL
  Filled 2015-01-30 (×2): qty 1

## 2015-01-30 MED ORDER — METHOCARBAMOL 500 MG PO TABS: 500.0000 mg | ORAL_TABLET | Freq: Three times a day (TID) | ORAL | Status: DC | PRN

## 2015-01-30 MED ORDER — SUCCINYLCHOLINE CHLORIDE 20 MG/ML IJ SOLN
INTRAMUSCULAR | Status: DC | PRN
  Administered 2015-01-30: 130 mg via INTRAVENOUS

## 2015-01-30 MED ORDER — MIDAZOLAM HCL 5 MG/5ML IJ SOLN
INTRAMUSCULAR | Status: DC | PRN
  Administered 2015-01-30: 2 mg via INTRAVENOUS

## 2015-01-30 MED ORDER — RISAQUAD PO CAPS
1.0000 | ORAL_CAPSULE | Freq: Every day | ORAL | Status: DC
  Administered 2015-01-30 – 2015-02-01 (×3): 1 via ORAL
  Filled 2015-01-30 (×3): qty 1

## 2015-01-30 MED ORDER — ACETAMINOPHEN 10 MG/ML IV SOLN
1000.0000 mg | Freq: Once | INTRAVENOUS | Status: AC
Start: 2015-01-30 — End: 2015-01-30
  Administered 2015-01-30: 1000 mg via INTRAVENOUS
  Filled 2015-01-30: qty 100

## 2015-01-30 MED ORDER — POLYMYXIN B SULFATE 500000 UNITS IJ SOLR
INTRAMUSCULAR | Status: AC
  Filled 2015-01-30: qty 1

## 2015-01-30 MED ORDER — FENTANYL CITRATE 0.05 MG/ML IJ SOLN
INTRAMUSCULAR | Status: DC | PRN
  Administered 2015-01-30: 100 ug via INTRAVENOUS
  Administered 2015-01-30 (×3): 50 ug via INTRAVENOUS

## 2015-01-30 MED ORDER — SODIUM CHLORIDE 0.9 % IJ SOLN
INTRAMUSCULAR | Status: AC
  Filled 2015-01-30: qty 10

## 2015-01-30 MED ORDER — METHOCARBAMOL 1000 MG/10ML IJ SOLN
500.0000 mg | Freq: Four times a day (QID) | INTRAVENOUS | Status: DC | PRN
  Administered 2015-01-30: 500 mg via INTRAVENOUS
  Filled 2015-01-30 (×2): qty 5

## 2015-01-30 MED ORDER — LACTATED RINGERS IV SOLN
INTRAVENOUS | Status: DC
  Administered 2015-01-30: 08:00:00 via INTRAVENOUS

## 2015-01-30 MED ORDER — BUPIVACAINE-EPINEPHRINE 0.5% -1:200000 IJ SOLN
INTRAMUSCULAR | Status: DC | PRN
  Administered 2015-01-30: 12 mL

## 2015-01-30 MED ORDER — THROMBIN 5000 UNITS EX SOLR
CUTANEOUS | Status: AC
  Filled 2015-01-30: qty 10000

## 2015-01-30 SURGICAL SUPPLY — 48 items
BAG SPEC THK2 15X12 ZIP CLS (MISCELLANEOUS)
BAG ZIPLOCK 12X15 (MISCELLANEOUS) IMPLANT
CLEANER TIP ELECTROSURG 2X2 (MISCELLANEOUS) ×3 IMPLANT
CLOSURE WOUND 1/2 X4 (GAUZE/BANDAGES/DRESSINGS)
CLOTH 2% CHLOROHEXIDINE 3PK (PERSONAL CARE ITEMS) ×3 IMPLANT
DRAPE MICROSCOPE LEICA (MISCELLANEOUS) ×3 IMPLANT
DRAPE POUCH INSTRU U-SHP 10X18 (DRAPES) ×3 IMPLANT
DRAPE SURG 17X11 SM STRL (DRAPES) ×3 IMPLANT
DRAPE UTILITY XL STRL (DRAPES) ×3 IMPLANT
DRSG AQUACEL AG ADV 3.5X 6 (GAUZE/BANDAGES/DRESSINGS) ×3 IMPLANT
DURAPREP 26ML APPLICATOR (WOUND CARE) ×3 IMPLANT
DURASEAL SPINE SEALANT 3ML (MISCELLANEOUS) IMPLANT
ELECT BLADE TIP CTD 4 INCH (ELECTRODE) IMPLANT
ELECT REM PT RETURN 9FT ADLT (ELECTROSURGICAL) ×3
ELECTRODE REM PT RTRN 9FT ADLT (ELECTROSURGICAL) ×1 IMPLANT
GLOVE BIOGEL PI IND STRL 7.5 (GLOVE) ×1 IMPLANT
GLOVE BIOGEL PI IND STRL 8 (GLOVE) ×1 IMPLANT
GLOVE BIOGEL PI INDICATOR 7.5 (GLOVE) ×2
GLOVE BIOGEL PI INDICATOR 8 (GLOVE) ×2
GLOVE SURG SS PI 7.5 STRL IVOR (GLOVE) ×3 IMPLANT
GLOVE SURG SS PI 8.0 STRL IVOR (GLOVE) ×9 IMPLANT
GOWN BRE IMP PREV XXLGXLNG (GOWN DISPOSABLE) ×3 IMPLANT
GOWN STRL REUS W/TWL XL LVL3 (GOWN DISPOSABLE) ×6 IMPLANT
IV CATH 14GX2 1/4 (CATHETERS) ×3 IMPLANT
KIT BASIN OR (CUSTOM PROCEDURE TRAY) ×3 IMPLANT
KIT POSITIONING SURG ANDREWS (MISCELLANEOUS) ×3 IMPLANT
MANIFOLD NEPTUNE II (INSTRUMENTS) ×3 IMPLANT
NEEDLE SPNL 18GX3.5 QUINCKE PK (NEEDLE) ×6 IMPLANT
PACK LAMINECTOMY ORTHO (CUSTOM PROCEDURE TRAY) ×3 IMPLANT
PATTIES SURGICAL .5 X.5 (GAUZE/BANDAGES/DRESSINGS) IMPLANT
PATTIES SURGICAL .75X.75 (GAUZE/BANDAGES/DRESSINGS) IMPLANT
PATTIES SURGICAL 1X1 (DISPOSABLE) IMPLANT
SPONGE LAP 4X18 X RAY DECT (DISPOSABLE) ×3 IMPLANT
SPONGE SURGIFOAM ABS GEL 100 (HEMOSTASIS) ×3 IMPLANT
STAPLER VISISTAT (STAPLE) ×3 IMPLANT
STRIP CLOSURE SKIN 1/2X4 (GAUZE/BANDAGES/DRESSINGS) IMPLANT
SUT NURALON 4 0 TR CR/8 (SUTURE) IMPLANT
SUT PROLENE 3 0 PS 2 (SUTURE) ×3 IMPLANT
SUT VIC AB 1 CT1 27 (SUTURE) ×6
SUT VIC AB 1 CT1 27XBRD ANTBC (SUTURE) ×2 IMPLANT
SUT VIC AB 1-0 CT2 27 (SUTURE) ×3 IMPLANT
SUT VIC AB 2-0 CT1 27 (SUTURE)
SUT VIC AB 2-0 CT1 TAPERPNT 27 (SUTURE) IMPLANT
SUT VIC AB 2-0 CT2 27 (SUTURE) ×6 IMPLANT
SYR 3ML LL SCALE MARK (SYRINGE) ×3 IMPLANT
TOWEL OR 17X26 10 PK STRL BLUE (TOWEL DISPOSABLE) ×3 IMPLANT
TOWEL OR NON WOVEN STRL DISP B (DISPOSABLE) IMPLANT
YANKAUER SUCT BULB TIP NO VENT (SUCTIONS) IMPLANT

## 2015-01-30 NOTE — Discharge Instructions (Signed)
Walk As Tolerated utilizing back precautions.  No bending, twisting, or lifting.  No driving for 2 weeks.   °Aquacel dressing may remain in place until follow up. May shower with aquacel dressing in place. If the dressing peels off or becomes saturated, you may remove aquacel dressing and place gauze and tape dressing which should be kept clean and dry and changed daily. Do not remove steri-strips if they are present. °See Dr. David Rodriquez in office in 10 to 14 days. Begin taking aspirin 81mg per day starting 4 days after your surgery if not allergic to aspirin or on another blood thinner. °Walk daily even outside. Use a cane or walker only if necessary. °Avoid sitting on soft sofas. ° °

## 2015-01-30 NOTE — Transfer of Care (Signed)
Immediate Anesthesia Transfer of Care Note  Patient: Marc Stevens  Procedure(s) Performed: Procedure(s): MICRO LUMBAR DECOMPRESSION, BILATERAL FORAMINOTOMIES, MICRODISCECTOMY LUMBAR FOUR TO LUMBAR FIVE (N/A)  Patient Location: PACU  Anesthesia Type:General  Level of Consciousness:  sedated, patient cooperative and responds to stimulation  Airway & Oxygen Therapy:Patient Spontanous Breathing and Patient connected to face mask oxgen  Post-op Assessment:  Report given to PACU RN and Post -op Vital signs reviewed and stable  Post vital signs:  Reviewed and stable  Last Vitals:  Filed Vitals:   01/30/15 0613  BP: 146/81  Pulse: 76  Temp: 36.4 C  Resp: 20    Complications: No apparent anesthesia complications

## 2015-01-30 NOTE — Op Note (Signed)
Marc Stevens, Marc Stevens          ACCOUNT NO.:  [id number]  MEDICAL RECORD NO.:  [id number]  LOCATION:  [id number]                         FACILITY:  [facility]  PHYSICIAN:  Susa Day, M.D.    DATE OF BIRTH:  18-Nov-1960  DATE OF PROCEDURE:  01/30/2015 DATE OF DISCHARGE:                              OPERATIVE REPORT   PREOPERATIVE DIAGNOSES:  Spinal stenosis, herniated nucleus pulposus at L4-5.  POSTOPERATIVE DIAGNOSES:  Spinal stenosis, herniated nucleus pulposus at L4-5.  PROCEDURES PERFORMED: 1. Microlumbar decompression at L4-5 with bilateral hemilaminotomies. 2. Bilateral foraminotomies at L4 and L5. 3. Microdiskectomy, L4-5, right.  ANESTHESIA:  General.  ASSISTANT:  Cleophas Dunker, PA.  Technical difficulty increased due to the patient's size, his BMI was 33.  HISTORY:  A 54 year old male with neurogenic claudication and severe spinal stenosis at L4-5, HNP paracentral to the right with right lower extremity radicular pain.  He had been refractory to conservative treatment, was indicated for decompression at 4-5.  He had multilevel disk degeneration.  We talked about residual back pain, this was improved with neurogenic claudication.  Risk and benefits were discussed including bleeding, infection, damage to neurovascular structure, DVT, PE, anesthetic complications, etc.  TECHNIQUE:  With the patient in supine position, after induction of adequate general anesthesia, 3 g of Kefzol and 900 of clindamycin, placed prone on the [place] frame.  All bony prominences were well padded.  Lumbar region was prepped and draped in usual sterile fashion. An 18-gauge spinal needle was utilized to localize the L4-5 interspace, confirmed with x-ray.  Incision was made from spinous process at 4-5. Subcutaneous tissue was dissected, electrocautery was utilized to achieve hemostasis.  Dorsolumbar fascia was identified and divided in line with skin incision.  Paraspinous muscle was  elevated from lamina of 4-5.  McCullough retractor was placed, confirmatory radiograph obtained. Operating microscope was draped and brought on the surgical field. There was a very small interlaminar window at 4-5.  I then removed the part of the spinous process of 4 and 5 and proceeded with bilateral hemilaminotomies, caudad edge of 4 first with a 2 and then the 3-mm Kerrison, detaching the cephalad and attachment of the ligamentum flavum.  We placed a neuro patty beneath the ligamentum flavum.  I then removed the ligamentum flavum, which was very hypertrophic from the interspace and there was hypertrophic facet.  We performed hemilaminotomies bilaterally of L5, the caudad edge protecting the neural elements at all times.  I then performed foraminotomies of L5. There was significant stenosis in the lateral recess and into the foramen of 5 bilaterally.  From both sides of the operating room table, we decompressed the lateral recesses to the medial border of the pedicle performing foraminotomies of 5.  There was stenosis noted in the foramen of 5 as well.  Decompressed to above the pedicle of 4.  There was a focal HNP on the right.  There was an epidural venous plexus and we used bipolar electrocautery, utilized to achieve hemostasis.  We obtained a confirmatory radiograph and did a small annular incision at 4-5 on the right and removed two small fragments of disk material from the disk space in the subannular space, further mobilized with a nerve hook  and a micropituitary.  We irrigated the disk space with antibiotic irrigation. We placed bone wax on the cancellous surfaces.  Thrombin-soaked Gelfoam was utilized as well and bipolar electrocautery.  After foraminotomies were performed, the neural probe passed freely up the foramen of 4 and 5 bilaterally.  No evidence of CSF leakage or active bleeding.  There was good restoration of the thecal sac, which was fairly stenotic.  There was  epidural lipomatosis noted as well.  Copiously irrigated with antibiotic irrigation, no active bleeding.  We removed the McCullough retractor and copiously irrigated the wound.  We used electrocautery on the paraspinous musculature.  Thrombin-soaked Gelfoam in the laminotomy defect, repaired the dorsolumbar fascia with 1 Vicryl interrupted figure- of-eight sutures, subcu with multiple 2-0 layers and skin with staples due to the patient's size.  The wound was dressed sterilely, placed supine on the hospital bed, extubated without difficulty, and transported to the recovery room in satisfactory condition.  The patient tolerated the procedure well.  No complications.  Assistant, Cleophas Dunker, PA was used for the patient positioning and gentle intermittent neural traction and closure.  Blood loss was 50 mL.     Susa Day, M.D.     [name]  D:  01/30/2015  T:  01/30/2015  Job:  [id number]

## 2015-01-30 NOTE — Interval H&P Note (Signed)
History and Physical Interval Note:  01/30/2015 7:29 AM  Marc Stevens  has presented today for surgery, with the diagnosis of HP STENOSIS L5  The various methods of treatment have been discussed with the patient and family. After consideration of risks, benefits and other options for treatment, the patient has consented to  Procedure(s): LUMBAR LAMINECTOMY/DECOMPRESSION MICRODISCECTOMY 1 LEVEL L4-5 (N/A) as a surgical intervention .  The patient's history has been reviewed, patient examined, no change in status, stable for surgery.  I have reviewed the patient's chart and labs.  Questions were answered to the patient's satisfaction.     Nalleli Largent C

## 2015-01-30 NOTE — Anesthesia Postprocedure Evaluation (Signed)
  Anesthesia Post-op Note  Patient: Marc Stevens  Procedure(s) Performed: Procedure(s) (LRB): MICRO LUMBAR DECOMPRESSION, BILATERAL FORAMINOTOMIES, MICRODISCECTOMY LUMBAR FOUR TO LUMBAR FIVE (N/A)  Patient Location: PACU  Anesthesia Type: General  Level of Consciousness: awake and alert   Airway and Oxygen Therapy: Patient Spontanous Breathing  Post-op Pain: mild  Post-op Assessment: Post-op Vital signs reviewed, Patient's Cardiovascular Status Stable, Respiratory Function Stable, Patent Airway and No signs of Nausea or vomiting  Last Vitals:  Filed Vitals:   01/30/15 1147  BP: 137/78  Pulse: 69  Temp: 36.6 C  Resp: 12    Post-op Vital Signs: stable   Complications: No apparent anesthesia complications

## 2015-01-30 NOTE — Anesthesia Preprocedure Evaluation (Addendum)
Anesthesia Evaluation  Patient identified by MRN, date of birth, ID band Patient awake    Reviewed: Allergy & Precautions, NPO status , Patient's Chart, lab work & pertinent test results  Airway Mallampati: II  TM Distance: >3 FB Neck ROM: Full    Dental  (+)    Pulmonary sleep apnea , former smoker,  breath sounds clear to auscultation  Pulmonary exam normal       Cardiovascular hypertension, Pt. on medications Rhythm:Regular Rate:Normal  Stress test 01-10-15 OK   Neuro/Psych negative neurological ROS  negative psych ROS   GI/Hepatic negative GI ROS, Neg liver ROS,   Endo/Other  negative endocrine ROS  Renal/GU negative Renal ROS  negative genitourinary   Musculoskeletal negative musculoskeletal ROS (+)   Abdominal (+) + obese,   Peds negative pediatric ROS (+)  Hematology negative hematology ROS (+)   Anesthesia Other Findings   Reproductive/Obstetrics negative OB ROS                            Anesthesia Physical Anesthesia Plan  ASA: II  Anesthesia Plan: General   Post-op Pain Management:    Induction: Intravenous  Airway Management Planned: Oral ETT  Additional Equipment:   Intra-op Plan:   Post-operative Plan: Extubation in OR  Informed Consent: I have reviewed the patients History and Physical, chart, labs and discussed the procedure including the risks, benefits and alternatives for the proposed anesthesia with the patient or authorized representative who has indicated his/her understanding and acceptance.   Dental advisory given  Plan Discussed with: CRNA  Anesthesia Plan Comments:         Anesthesia Quick Evaluation

## 2015-01-30 NOTE — Plan of Care (Signed)
Problem: Phase I Progression Outcomes Goal: Initial discharge plan identified Outcome: Completed/Met Date Met:  01/30/15 Pt plans to go home at discharge.

## 2015-01-30 NOTE — Brief Op Note (Signed)
01/30/2015  10:29 AM  PATIENT:  Marc Stevens  54 y.o. male  PRE-OPERATIVE DIAGNOSIS:  HERNIATED NUCLEUS PULPOSIS, STENOSIS L4-L5  POST-OPERATIVE DIAGNOSIS:  HERNIATED NUCLEUS PULPOSIS, STENOSIS L4-L5  PROCEDURE:  Procedure(s): MICRO LUMBAR DECOMPRESSION, BILATERAL FORAMINOTOMIES, MICRODISCECTOMY LUMBAR FOUR TO LUMBAR FIVE (N/A)  SURGEON:  Surgeon(s) and Role:    * Susa Day, MD - [facility]  PHYSICIAN ASSISTANT:   ASSISTANTS:    ANESTHESIA:   general  EBL:  Total I/O In: 1000 [I.V.:1000] Out: 60 [Blood:60]  BLOOD ADMINISTERED:none  DRAINS: none   LOCAL MEDICATIONS USED:  MARCAINE     SPECIMEN:  No Specimen  DISPOSITION OF SPECIMEN:  PATHOLOGY  COUNTS:  YES  TOURNIQUET:  * No tourniquets in log *  DICTATION: .Other Dictation: Dictation Number [id number]  PLAN OF CARE: Admit for overnight observation  PATIENT DISPOSITION:  PACU - hemodynamically stable.   Delay start of Pharmacological VTE agent (>24hrs) due to surgical blood loss or risk of bleeding: yes

## 2015-01-30 NOTE — H&P (View-Only) (Signed)
Marc Stevens is an 54 y.o. male.   Chief Complaint: back and R leg pain HPI: The patient is a 54 year old male who presents today for follow up of their back. The patient is being followed for their back pain. They are now month(s) out from when symptoms began. Symptoms reported today include: pain (low back), while the patient does not report symptoms of: leg pain. Current treatment includes: home exercise program (stretches), NSAIDs (Advil) and ice. The patient reports their current pain level to be moderate to severe.  Past Medical History  Diagnosis Date  . Hypertension   . OSA (obstructive sleep apnea)   . History of tobacco abuse     quit 2008  . Chest pain 04/18/2012    Past Surgical History  Procedure Laterality Date  . Rotator cuff repair  2007    Left  . Doppler echocardiography  2013    Family History  Problem Relation Age of Onset  . Dementia Mother   . Hypertension Mother    Social History:  reports that he quit smoking about 7 years ago. He does not have any smokeless tobacco history on file. He reports that he drinks alcohol. He reports that he does not use illicit drugs.  Allergies: No Known Allergies   (Not in a hospital admission)  No results found for this or any previous visit (from the past 48 hour(s)). No results found.  Review of Systems  Constitutional: Negative.   HENT: Negative.   Eyes: Negative.   Respiratory: Negative.   Cardiovascular: Negative.   Gastrointestinal: Negative.   Genitourinary: Negative.   Musculoskeletal: Positive for back pain.  Skin: Negative.   Neurological: Positive for focal weakness.  Psychiatric/Behavioral: Negative.     There were no vitals taken for this visit. Physical Exam  Constitutional: He is oriented to person, place, and time. He appears well-developed and well-nourished.  HENT:  Head: Normocephalic and atraumatic.  Eyes: Conjunctivae and EOM are normal. Pupils are equal, round, and reactive to  light.  Neck: Normal range of motion. Neck supple.  Cardiovascular: Normal rate and regular rhythm.   Respiratory: Effort normal and breath sounds normal.  GI: Soft. Bowel sounds are normal.  Musculoskeletal:  Straight leg raise on the right produces buttock, thigh and calf pain. On the left it is negative. Trace EHL weakness is noted. He has difficulty ambulating. Radiates into the buttock and down into the hip significantly.  Neurological: He is alert and oriented to person, place, and time. He has normal reflexes.  Skin: Skin is warm and dry.  Psychiatric: He has a normal mood and affect.    MRI of his lumbar spine by Dr. Nelva Bush back in December demonstrates severe stenosis at L4-5 and disc herniation at L4-5 to the right. He has mainly right sided instead of left sided symptoms.  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  We had a discussion concerning current pathology, relevant anatomy and treatment options. We discussed his back is bothering him more than his shoulder at this point. We discussed decompression at L4-5.  I had an extensive discussion of the risks and benefits of the lumbar decompression with the patient including bleeding, infection, damage to neurovascular structures, epidural fibrosis, CSF leak requiring repair. We also discussed increase in pain, adjacent segment disease, recurrent disc herniation, need for future surgery including repeat decompression and/or fusion. We also discussed risks of  postoperative hematoma, paralysis, anesthetic complications including DVT, PE, death, cardiopulmonary dysfunction. In addition, the perioperative and postoperative courses were discussed in detail including the rehabilitative time and return to functional activity and work. I provided the patient with an illustrated handout and utilized the appropriate  surgical models.  We did discuss epidural and had offered that prior to that. He has declined epidural. He does not want to proceed with an epidural. He would like to proceed with decompression. In terms of his shoulder we discussed rotator cuff repair.  Continue with his current pain medicine. He indicates he has been taking steroids. The lumbar situation that has been bothering him for a significant period of time. He does have severe stenosis noted on his MRI. He has had a couple of steroid Dosepaks. He is also treated by his primary care physician just prior to this. This was back in July 2015. He saw Dr. Golden Pop in [place], [place] and has had extensive conservative treatment. Discussed he could live with his symptoms as they are now. 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 We obtained three view x-rays today. Disc degeneration at L3-4, L4-5 and L5-S1 but no instability on flexion and extension. He has residual back pain following this. He is actually having it in the hip, buttock and leg. He has also had local trigger point injections as well. He has severe stenosis at L4-5. It is affecting his ability to walk for long distances. He gets pain into right leg and has to sit down. He does get relief with sitting. If he has any further questions or concerns he should feel free to call me on that. Out of work. We discussed short and long term disability in terms of proceeding with the rotator cuff repair following the decompression. Again he has had much more problems with his back, leg, buttock and hip than he has with his shoulder today. Avoid overhead lifting and heavy lifting. We will obtain preoperative clearance.  Plan microlumbar decompression L4-5  Riverton Shellhammer M. PA-C for Dr. Tonita Cong 01/22/2015, 8:13 AM

## 2015-01-30 NOTE — Anesthesia Procedure Notes (Signed)
Procedure Name: Intubation Date/Time: 01/30/2015 8:47 AM Performed by: Montel Clock Pre-anesthesia Checklist: Patient identified, Emergency Drugs available, Suction available and Patient being monitored Patient Re-evaluated:Patient Re-evaluated prior to inductionOxygen Delivery Method: Circle System Utilized Preoxygenation: Pre-oxygenation with 100% oxygen Intubation Type: IV induction Ventilation: Oral airway inserted - appropriate to patient size and Mask ventilation without difficulty Grade View: Grade I Tube type: Oral Tube size: 7.5 mm Number of attempts: 1 Airway Equipment and Method: Stylet and Oral airway Placement Confirmation: ETT inserted through vocal cords under direct vision,  positive ETCO2 and breath sounds checked- equal and bilateral Secured at: 23 cm Tube secured with: Tape Dental Injury: Teeth and Oropharynx as per pre-operative assessment

## 2015-01-30 NOTE — Evaluation (Signed)
Physical Therapy Evaluation Patient Details Name: Marc Stevens MRN: [id number] DOB: 1960-10-23 Today's Date: 01/30/2015   History of Present Illness  MICRO LUMBAR DECOMPRESSION, BILATERAL FORAMINOTOMIES, MICRODISCECTOMY LUMBAR FOUR TO LUMBAR FIVE  Clinical Impression  Patient heard yelling out, RN in w/ PT, gave IV meds, gradually assisted patient to edge of bed , stood at [facility] then transferred to recliner. Patient reports Posterior neck is the most painful. "I can't hold my head up".  Patient will benefit from PT to address problems listed in note below.  Follow Up Recommendations Home health PT    Equipment Recommendations  Rolling walker with 5" wheels    Recommendations for Other Services       Precautions / Restrictions Precautions Precautions: Back Precaution Comments: unable to instruct due to pain issues      Mobility  Bed Mobility Overal bed mobility: Needs Assistance Bed Mobility: Supine to Sit     Supine to sit: HOB elevated;Min assist     General bed mobility comments: min assist, HOB slowly raised maximally to sitting upright, use of rail to assist self to edge of bed.  Transfers Overall transfer level: Needs assistance Equipment used: Rolling walker (2 wheeled) Transfers: Sit to/from [facility] Sit to Stand: From elevated surface;+2 safety/equipment;Min assist Stand pivot transfers: +2 safety/equipment;Min assist       General transfer comment: stood x 5 minutes, adjusting body, leaning forward, rotating neck, attmpts to extend neck but c/o increased pain, gradually able to get to recliner, cues for hand placement.  Ambulation/Gait Ambulation/Gait assistance: Min assist;+2 safety/equipment              Stairs            Wheelchair Mobility    Modified Rankin (Stroke Patients Only)       Balance                                             Pertinent Vitals/Pain Pain Assessment: 0-10 Pain  Score: 10-Worst pain ever Pain Location: neck at about c 67, massaged , deep, heat packs placed supported with a pillow in sitting in recliner. Pain Descriptors / Indicators: Grimacing;Crying;Guarding;Heaviness Pain Intervention(s): Limited activity within patient's tolerance;Monitored during session;Premedicated before session;Repositioned;Heat applied    Home Living Family/patient expects to be discharged to:: Private residence Living Arrangements: Alone Available Help at Discharge: Friend(s) Type of Home: House       Home Layout: One level;Able to live on main level with bedroom/bathroom Home Equipment: None      Prior Function Level of Independence: Independent               Hand Dominance        Extremity/Trunk Assessment   Upper Extremity Assessment: Overall WFL for tasks assessed           Lower Extremity Assessment: Overall WFL for tasks assessed      Cervical / Trunk Assessment: Other exceptions  Communication   Communication: No difficulties  Cognition Arousal/Alertness: Awake/alert Behavior During Therapy: Anxious Overall Cognitive Status: Within Functional Limits for tasks assessed                      General Comments      Exercises        Assessment/Plan    PT Assessment Patient needs continued PT services  PT Diagnosis Difficulty  walking;Acute pain   PT Problem List Decreased range of motion;Decreased activity tolerance;Decreased mobility  PT Treatment Interventions DME instruction;Gait training;Functional mobility training;Therapeutic activities;Therapeutic exercise;Patient/family education   PT Goals (Current goals can be found in the Care Plan section) Acute Rehab PT Goals Patient Stated Goal: for my neck to get better. PT Goal Formulation: With patient Time For Goal Achievement: 02/06/15 Potential to Achieve Goals: Good    Frequency Min 6X/week   Barriers to discharge Decreased caregiver support       Co-evaluation               End of Session   Activity Tolerance: No increased pain Patient left: in chair;with call bell/phone within reach Nurse Communication: Mobility status    Functional Assessment Tool Used: clinical judgement Functional Limitation: Mobility: Walking and moving around;Changing and maintaining body position Mobility: Walking and Moving Around Current Status [phone]): At least 40 percent but less than 60 percent impaired, limited or restricted Mobility: Walking and Moving Around Goal Status [phone]): At least 40 percent but less than 60 percent impaired, limited or restricted Changing and Maintaining Body Position Current Status (P3790): At least 40 percent but less than 60 percent impaired, limited or restricted Changing and Maintaining Body Position Goal Status (W4097): 0 percent impaired, limited or restricted    Time: 3532-9924 PT Time Calculation (min) (ACUTE ONLY): 26 min   Charges:   PT Evaluation $Initial PT Evaluation Tier I: 1 Procedure PT Treatments $Therapeutic Activity: 8-22 mins   PT G Codes:   PT G-Codes **NOT FOR INPATIENT CLASS** Functional Assessment Tool Used: clinical judgement Functional Limitation: Mobility: Walking and moving around;Changing and maintaining body position Mobility: Walking and Moving Around Current Status [phone]): At least 40 percent but less than 60 percent impaired, limited or restricted Mobility: Walking and Moving Around Goal Status [phone]): At least 40 percent but less than 60 percent impaired, limited or restricted Changing and Maintaining Body Position Current Status (W9798): At least 40 percent but less than 60 percent impaired, limited or restricted Changing and Maintaining Body Position Goal Status (X2119): 0 percent impaired, limited or restricted    Claretha Cooper 01/30/2015, 5:27 PM  Tresa Endo PT [phone]

## 2015-01-31 ENCOUNTER — Encounter (HOSPITAL_COMMUNITY): Payer: Self-pay | Admitting: Specialist

## 2015-01-31 DIAGNOSIS — M5126 Other intervertebral disc displacement, lumbar region: Secondary | ICD-10-CM | POA: Diagnosis not present

## 2015-01-31 LAB — BASIC METABOLIC PANEL
Anion gap: 8 (ref 5–15)
BUN: 15 mg/dL (ref 6–23)
CHLORIDE: 97 mmol/L (ref 96–112)
CO2: 32 mmol/L (ref 19–32)
Calcium: 8.8 mg/dL (ref 8.4–10.5)
Creatinine, Ser: 1.08 mg/dL (ref 0.50–1.35)
GFR calc Af Amer: 89 mL/min — ABNORMAL LOW (ref >90)
GFR calc non Af Amer: 77 mL/min — ABNORMAL LOW (ref >90)
Glucose, Bld: 125 mg/dL — ABNORMAL HIGH (ref 70–99)
POTASSIUM: 3.2 mmol/L — AB (ref 3.5–5.1)
Sodium: 137 mmol/L (ref 135–145)

## 2015-01-31 LAB — CBC
HEMATOCRIT: 42.5 % (ref 39.0–52.0)
HEMOGLOBIN: 13.2 g/dL (ref 13.0–17.0)
MCH: 20.4 pg — ABNORMAL LOW (ref 26.0–34.0)
MCHC: 31.1 g/dL (ref 30.0–36.0)
MCV: 65.6 fL — AB (ref 78.0–100.0)
Platelets: 257 10*3/uL (ref 150–400)
RBC: 6.48 MIL/uL — AB (ref 4.22–5.81)
RDW: 15.7 % — ABNORMAL HIGH (ref 11.5–15.5)
WBC: 9.6 10*3/uL (ref 4.0–10.5)

## 2015-01-31 MED ORDER — OXYCODONE HCL 5 MG PO TABS
5.0000 mg | ORAL_TABLET | ORAL | Status: DC | PRN
  Administered 2015-01-31 – 2015-02-01 (×8): 15 mg via ORAL
  Filled 2015-01-31 (×8): qty 3

## 2015-01-31 NOTE — Progress Notes (Signed)
CARE MANAGEMENT NOTE 01/31/2015  Patient:  RONOLD, HARDGROVE   Account Number:  [id number]  Date Initiated:  01/31/2015  Documentation initiated by:  Edwyna Shell  Subjective/Objective Assessment:   54 yo male admitted for spinal stenosis from home     Action/Plan:   discharge planning   Anticipated DC Date:  01/31/2015   Anticipated DC Plan:  [place]  CM consult      Choice offered to / List presented to:     DME arranged  Vassie Moselle      DME agency  [place].        Status of service:  Completed, signed off Medicare Important Message given?   (If response is "NO", the following Medicare IM given date fields will be blank) Date Medicare IM given:   Medicare IM given by:   Date Additional Medicare IM given:   Additional Medicare IM given by:    Discharge Disposition:  HOME/SELF CARE  Per UR Regulation:    If discussed at Long Length of Stay Meetings, dates discussed:    Comments:  01/31/15 Edwyna Shell RN BSN CM [phone] Patient stated that he did not feel that he needs [facility] PT but was agreeable to have the rolling walker delivered to the room. Spoke with staff RN Judson Roch and she stated that the patient did not need follow up [facility] PT and order was removed. Patient made aware.

## 2015-01-31 NOTE — Progress Notes (Signed)
Occupational Therapy Treatment Patient Details Name: Marc Stevens MRN: [id number] DOB: 06/30/1961 Today's Date: 01/31/2015    History of present illness MICRO LUMBAR DECOMPRESSION, BILATERAL FORAMINOTOMIES, MICRODISCECTOMY LUMBAR FOUR TO LUMBAR FIVE   OT comments  Ambulated to bathroom and practiced transfer to 3:1--pt's pain increased with ambulation.  Repositioned back in bed.  Would benefit from an extra day in acute as he will just have intermittent assistance at home.  Follow Up Recommendations  No OT follow up;Supervision - Intermittent (would benefit from 24 A at first)    Equipment Recommendations  3 in 1 bedside comode    Recommendations for Other Services      Precautions / Restrictions Precautions Precautions: Back Restrictions Weight Bearing Restrictions: No       Mobility Bed Mobility           Sit to supine: Min assist   General bed mobility comments: min assist for legs  Transfers Overall transfer level: Needs assistance Equipment used: Rolling walker (2 wheeled) Transfers: Sit to/from Stand Sit to Stand: Min assist         General transfer comment: cues to scoot forward following back precautions and for keeping back straight with sit to stand    Balance                                   ADL Overall ADL's : Needs assistance/impaired                         Toilet Transfer: Minimal assistance;Ambulation;BSC             General ADL Comments: ambulated to bathroom and practiced 3:1 commode transfer.  He decided that he will get this for home.  Pt plans to sponge bathe:  he is not ready to step into tub.  Explained tub bench as option, but this is an out of pocket expense.  Pt plans to get toilet aide prior to leaving hospital.  Repositioned on side for comfort prior to leaving      Vision                     Perception     Praxis      Cognition   Behavior During Therapy: [facility] for tasks  assessed/performed Overall Cognitive Status: Within Functional Limits for tasks assessed                       Extremity/Trunk Assessment  Upper Extremity Assessment Upper Extremity Assessment: RUE deficits/detail RUE Deficits / Details: pt reports he needs surgery on R shoulder next            Exercises     Shoulder Instructions       General Comments      Pertinent Vitals/ Pain       Pain Assessment: 0-10 Pain Score: 10-Worst pain ever (with standing; 5 initially, 8 back in bed) Pain Location: back of legs Pain Descriptors / Indicators: Aching Pain Intervention(s): Limited activity within patient's tolerance  Home Living Family/patient expects to be discharged to:: Private residence Living Arrangements: Alone Available Help at Discharge: Friend(s)               Bathroom Shower/Tub: Tub/shower unit;Curtain Shower/tub characteristics: Architectural technologist: Standard     Home Equipment: None          Prior Functioning/Environment  Level of Independence: Independent            Frequency Min 2X/week     Progress Toward Goals  OT Goals(current goals can now be found in the care plan section)  Progress towards OT goals: Progressing toward goals  Acute Rehab OT Goals Patient Stated Goal: decreased pain OT Goal Formulation: With patient Time For Goal Achievement: 02/07/15 Potential to Achieve Goals: Good ADL Goals Pt Will Transfer to Toilet: with supervision;bedside commode;ambulating Pt Will Perform Toileting - Clothing Manipulation and hygiene: with supervision;sit to/from stand Additional ADL Goal #1: pt will perform LB adls at supervision level with AE, sit to stand  Plan      Co-evaluation                 End of Session     Activity Tolerance Patient limited by pain   Patient Left in bed;with call bell/phone within reach   Nurse Communication      Functional Assessment Tool Used: clinical observation and  judgment Functional Limitation: Self care Self Care Current Status (K8206): At least 20 percent but less than 40 percent impaired, limited or restricted Self Care Goal Status (O1561): At least 1 percent but less than 20 percent impaired, limited or restricted   Time: 5379-4327 OT Time Calculation (min): 28 min  Charges: OT G-codes **NOT FOR INPATIENT CLASS**   OT General Charges $OT Visit: 1 Procedure  OT Treatments $Self Care/Home Management : 23-37 mins  Danile Trier 01/31/2015, 1:48 PM  Lesle Chris, OTR/L [phone] 01/31/2015

## 2015-01-31 NOTE — Progress Notes (Signed)
RT placed patient on CPAP. Sterile water added to water chamber for humidification. Patient home setting is 15 cmH2O. Patient tolerating well. RT will continue to monitor and assess as needed.

## 2015-01-31 NOTE — Progress Notes (Signed)
Subjective: 1 Day Post-Op Procedure(s) (LRB): MICRO LUMBAR DECOMPRESSION, BILATERAL FORAMINOTOMIES, MICRODISCECTOMY LUMBAR FOUR TO LUMBAR FIVE (N/A) Patient reports pain as moderate.  Reports incisional and upper musculoskeletal back pain, no leg pain. Reporting some pain when up walking with PT. Voiding without difficulty.  Objective: Vital signs in last 24 hours: Temp:  [97.5 F (36.4 C)-98.4 F (36.9 C)] 98.3 F (36.8 C) (04/14 0543) Pulse Rate:  [63-91] 73 (04/14 0543) Resp:  [6-16] 16 (04/14 0543) BP: (124-141)/(66-85) 126/66 mmHg (04/14 0543) SpO2:  [96 %-100 %] 99 % (04/14 0543) Weight:  [125.193 kg (276 lb)] 125.193 kg (276 lb) (04/13 1147)  Intake/Output from previous day: 04/13 0701 - 04/14 0700 In: 2320 [P.O.:520; I.V.:1800] Out: 1210 [Urine:1150; Blood:60] Intake/Output this shift: Total I/O In: -  Out: 500 [Urine:500]   Recent Labs  01/31/15 0508  HGB 13.2    Recent Labs  01/31/15 0508  WBC 9.6  RBC 6.48*  HCT 42.5  PLT 257    Recent Labs  01/31/15 0508  NA 137  K 3.2*  CL 97  CO2 32  BUN 15  CREATININE 1.08  GLUCOSE 125*  CALCIUM 8.8   No results for input(s): LABPT, INR in the last 72 hours.  Neurologically intact ABD soft Neurovascular intact Sensation intact distally Intact pulses distally Dorsiflexion/Plantar flexion intact Incision: dressing C/D/I and no drainage No cellulitis present Compartment soft no calf pain or sign of DVT  Assessment/Plan: 1 Day Post-Op Procedure(s) (LRB): MICRO LUMBAR DECOMPRESSION, BILATERAL FORAMINOTOMIES, MICRODISCECTOMY LUMBAR FOUR TO LUMBAR FIVE (N/A) Advance diet Up with therapy D/C IV fluids  Possible D/C later today if pain controlled, doing well with PT, voiding without difficulty, otherwise plan for D/C tomorrow Discussed D/C instructions, precautions, dressing instructions   Donette Mainwaring M. 01/31/2015, 9:48 AM

## 2015-01-31 NOTE — Progress Notes (Signed)
Physical Therapy Treatment Patient Details Name: Marc Stevens MRN: [id number] DOB: Aug 17, 1961 Today's Date: 01/31/2015    History of Present Illness MICRO LUMBAR DECOMPRESSION, BILATERAL FORAMINOTOMIES, MICRODISCECTOMY LUMBAR FOUR TO LUMBAR FIVE    PT Comments    PM session withheld as pt had just worked with OT and currently in pain and resting.  Pt stated he would feel more comfortable and need to stay another night to help with pain control and mobility.   Will plans to see him in am.    Rica Koyanagi  PTA [facility]  Acute  Rehab Pager      [phone]

## 2015-01-31 NOTE — Progress Notes (Addendum)
Physical Therapy Treatment Patient Details Name: Marc Stevens MRN: [id number] DOB: 05/05/1961 Today's Date: 01/31/2015    History of Present Illness MICRO LUMBAR DECOMPRESSION, BILATERAL FORAMINOTOMIES, MICRODISCECTOMY LUMBAR FOUR TO LUMBAR FIVE    PT Comments    POD #1; Pt was in chair; sit to stand from chair to walker; ambulate 50 ft; returned back to room in chair pt c/o of tired; Education on back precautions reviewed, back op and precaution handout provided to pt; Surgeon came to room spoke with pt; applied ice to pt and left in chair; pt started falling asleep/drowsiness possibly due to pain meds.   Follow Up Recommendations  Home health PT     Equipment Recommendations  Rolling walker with 5" wheels    Recommendations for Other Services       Precautions / Restrictions Precautions Precautions: Back Restrictions Weight Bearing Restrictions: No    Mobility  Bed Mobility               General bed mobility comments: Pt up in chair already  Transfers Overall transfer level: Needs assistance Equipment used: Rolling walker (2 wheeled) Transfers: Sit to/from Stand Sit to Stand: Min assist;+2 safety/equipment         General transfer comment: Pt able to stand grimacing due to pain;   Ambulation/Gait Ambulation/Gait assistance: Min assist;+2 safety/equipment Ambulation Distance (Feet): 50 Feet Assistive device: Rolling walker (2 wheeled) Gait Pattern/deviations: Step-through pattern     General Gait Details: ambulate gingerly; c/o of pain    Stairs            Wheelchair Mobility    Modified Rankin (Stroke Patients Only)       Balance                                    Cognition Arousal/Alertness: Awake/alert Behavior During Therapy: WFL for tasks assessed/performed Overall Cognitive Status: Within Functional Limits for tasks assessed                      Exercises      General Comments         Pertinent Vitals/Pain Pain Assessment: 0-10 Pain Score: 10-Worst pain ever (8/10 when seated 10/10 when standing and ambulating) Pain Location: back of legs; buttocks area Pain Descriptors / Indicators: Aching;Cramping;Grimacing Pain Intervention(s): Limited activity within patient's tolerance;Monitored during session;Repositioned;Premedicated before session;Ice applied    Home Living Family/patient expects to be discharged to:: Private residence Living Arrangements: Alone Available Help at Discharge: Friend(s)         Home Equipment: None      Prior Function Level of Independence: Independent          PT Goals (current goals can now be found in the care plan section) Acute Rehab PT Goals Patient Stated Goal: decreased pain Progress towards PT goals: Progressing toward goals    Frequency  Min 6X/week    PT Plan      Co-evaluation             End of Session Equipment Utilized During Treatment: Gait belt Activity Tolerance: Patient tolerated treatment well (pain increased with ambulating) Patient left: in chair;with call bell/phone within reach     Time:   0922-1000    Charges:                       G Codes:  Duric,Sreto Student, PTA  01/31/2015, 12:51 PM   Reviewed above  Rica Koyanagi  PTA [facility]  Acute  Rehab Pager      [phone]

## 2015-01-31 NOTE — Evaluation (Signed)
Occupational Therapy Evaluation Patient Details Name: Marc Stevens MRN: [id number] DOB: June 10, 1961 Today's Date: 01/31/2015    History of Present Illness MICRO LUMBAR DECOMPRESSION, BILATERAL FORAMINOTOMIES, MICRODISCECTOMY LUMBAR FOUR TO LUMBAR FIVE   Clinical Impression   This 54 year old man was admitted for the above surgery.  Evaluation was limited due to pain.  Pt will return home alone with intermittent assistance.  Will return for one more visit in acute setting to reinforce education and practice commode transfers.  Goals are for supervision.  Pt was independent at baseline and he needed min A at time of evaluation.    Follow Up Recommendations  No OT follow up;Supervision - Intermittent    Equipment Recommendations  3 in 1 bedside comode (pt is checking to see if he can borrow this)    Recommendations for Other Services       Precautions / Restrictions Precautions Precautions: Back Restrictions Weight Bearing Restrictions: No      Mobility Bed Mobility               General bed mobility comments: pt up in chair  Transfers                      Balance                                            ADL Overall ADL's : Needs assistance/impaired                                       General ADL Comments: Pt seen only from seated level in chair due to leg pain:  will return for commode transfer prior to d/c.  Pt is able to perform UB adls with set up.  Reviewed methods to don LB clothing following back precautions.  Pt used to bend forward prior to sx.  He does have a reacher at home as well as a long brush to wash back.  Educated on sock aide and toilet aide, which he would like to get.  He will have intermittent assistance at home.  Discussed having someone come over and take ted hose off and reapply as he won't be able to manage these alone.  Educated on tub transfer but recommended pt sponge bathe intially.   He has a low commode.  He is checking to see if he can borrow one.  Pt needs overall min A for LB adls with AE.  Gave pt back education sheet as well as resource list for AE.     Vision     Perception     Praxis      Pertinent Vitals/Pain Pain Score: 8  Pain Location: back of legs mostly and neck sore Pain Descriptors / Indicators: Aching Pain Intervention(s): Limited activity within patient's tolerance;Monitored during session;Premedicated before session     Hand Dominance     Extremity/Trunk Assessment Upper Extremity Assessment Upper Extremity Assessment: RUE deficits/detail RUE Deficits / Details: pt reports he needs surgery on R shoulder next           Communication Communication Communication: No difficulties   Cognition Arousal/Alertness:  (pt closed eyes at times; needed information repeated) Behavior During Therapy: [facility] for tasks assessed/performed  General Comments       Exercises       Shoulder Instructions      Home Living Family/patient expects to be discharged to:: Private residence Living Arrangements: Alone Available Help at Discharge: Friend(s)               Bathroom Shower/Tub: Tub/shower unit;Curtain Shower/tub characteristics: Architectural technologist: Standard     Home Equipment: None          Prior Functioning/Environment Level of Independence: Independent             OT Diagnosis: Acute pain   OT Problem List: Decreased strength;Pain;Decreased activity tolerance;Decreased knowledge of use of DME or AE;Decreased knowledge of precautions   OT Treatment/Interventions: Self-care/ADL training;DME and/or AE instruction;Patient/family education    OT Goals(Current goals can be found in the care plan section) Acute Rehab OT Goals Patient Stated Goal: decreased pain OT Goal Formulation: With patient Time For Goal Achievement: 02/07/15 Potential to Achieve Goals: Good  OT Frequency: Min  2X/week   Barriers to D/C:            Co-evaluation              End of Session    Activity Tolerance: No increased pain Patient left: in chair;with call bell/phone within reach   Time: 5329-9242 OT Time Calculation (min): 27 min Charges:  OT General Charges $OT Visit: 1 Procedure OT Evaluation $Initial OT Evaluation Tier I: 1 Procedure OT Treatments $Self Care/Home Management : 8-22 mins G-Codes: OT G-codes **NOT FOR INPATIENT CLASS** Functional Assessment Tool Used: clinical observation and judgment Functional Limitation: Self care Self Care Current Status (A8341): At least 20 percent but less than 40 percent impaired, limited or restricted Self Care Goal Status (D6222): At least 1 percent but less than 20 percent impaired, limited or restricted  [facility] 01/31/2015, 11:23 AM  Lesle Chris, OTR/L [phone] 01/31/2015

## 2015-02-01 DIAGNOSIS — M5126 Other intervertebral disc displacement, lumbar region: Secondary | ICD-10-CM | POA: Diagnosis not present

## 2015-02-01 MED ORDER — GABAPENTIN 300 MG PO CAPS
300.0000 mg | ORAL_CAPSULE | Freq: Three times a day (TID) | ORAL | Status: DC
  Administered 2015-02-01: 300 mg via ORAL
  Filled 2015-02-01 (×3): qty 1

## 2015-02-01 MED ORDER — ASPIRIN EC 81 MG PO TBEC: 81.0000 mg | DELAYED_RELEASE_TABLET | Freq: Every morning | ORAL | Status: AC

## 2015-02-01 MED ORDER — GABAPENTIN 600 MG PO TABS
300.0000 mg | ORAL_TABLET | Freq: Three times a day (TID) | ORAL | Status: DC
  Filled 2015-02-01: qty 0.5

## 2015-02-01 MED ORDER — GABAPENTIN 300 MG PO CAPS: 300.0000 mg | ORAL_CAPSULE | Freq: Three times a day (TID) | ORAL | Status: DC

## 2015-02-01 NOTE — Progress Notes (Signed)
Subjective: 2 Days Post-Op Procedure(s) (LRB): MICRO LUMBAR DECOMPRESSION, BILATERAL FORAMINOTOMIES, MICRODISCECTOMY LUMBAR FOUR TO LUMBAR FIVE (N/A) Patient reports pain as moderate to severe. Reports pain is not much better today despite IV dilaudid yesterday and increasing OxyIR to 15mg  q3h. He is c/o mostly of shooting leg pain at this point as opposed to incisional back pain. Voiding without difficulty. No other c/o.  Objective: Vital signs in last 24 hours: Temp:  [97.6 F (36.4 C)-99.7 F (37.6 C)] 99.7 F (37.6 C) (04/15 0527) Pulse Rate:  [71-80] 72 (04/15 0527) Resp:  [16-18] 16 (04/15 0527) BP: (121-144)/(62-77) 124/71 mmHg (04/15 0527) SpO2:  [92 %-100 %] 92 % (04/15 0527)  Intake/Output from previous day: 04/14 0701 - 04/15 0700 In: 1813.1 [P.O.:240; I.V.:1523.1; IV Piggyback:50] Out: 2325 [Urine:2325] Intake/Output this shift: Total I/O In: -  Out: 700 [Urine:700]   Recent Labs  01/31/15 0508  HGB 13.2    Recent Labs  01/31/15 0508  WBC 9.6  RBC 6.48*  HCT 42.5  PLT 257    Recent Labs  01/31/15 0508  NA 137  K 3.2*  CL 97  CO2 32  BUN 15  CREATININE 1.08  GLUCOSE 125*  CALCIUM 8.8   No results for input(s): LABPT, INR in the last 72 hours.  Neurologically intact ABD soft Neurovascular intact Sensation intact distally Intact pulses distally Dorsiflexion/Plantar flexion intact Incision: dressing C/D/I and no drainage No cellulitis present Compartment soft no calf pain or sign of DVT  Assessment/Plan: 2 Days Post-Op Procedure(s) (LRB): MICRO LUMBAR DECOMPRESSION, BILATERAL FORAMINOTOMIES, MICRODISCECTOMY LUMBAR FOUR TO LUMBAR FIVE (N/A) Advance diet Up with therapy D/C IV fluids  Seems to be c/o more nerve pain at this point, could be reinnervation neuritis, will add gabapentin and hopefully be able to cut back on amount of narcotics Plan possible D/C later today if pain is better controlled Will discuss with Dr. Mliss Fritz,  Conley Rolls. 02/01/2015, 7:28 AM

## 2015-02-01 NOTE — Progress Notes (Signed)
Called back to pt bedside.  Pt stated that his cpap pressure is 19, not 15cm h2o.  Cpap settings adjusted per pt request.  Pt stated pressure feels better now.  RT will continue to monitor and assess as needed.

## 2015-02-01 NOTE — Progress Notes (Signed)
OT Cancellation Note  Patient Details Name: Marc Stevens MRN: [id number] DOB: [birth year]   Cancelled Treatment:    Reason Eval/Treat Not Completed: Other (comment).  Checked with pt.  He feels comfortable with AE for ADLs, precautions, toilet transfers--practiced yesterday.  He states that he is not interested in tub bench and will sponge bathe until he can safely step into tub.  No further OT needs at this time.  Amritpal Shropshire 02/01/2015, 11:33 AM  Lesle Chris, OTR/L [phone] 02/01/2015

## 2015-02-01 NOTE — Progress Notes (Signed)
Physical Therapy Treatment Patient Details Name: Marc Stevens MRN: [id number] DOB: March 21, 1961 Today's Date: 02/01/2015    History of Present Illness MICRO LUMBAR DECOMPRESSION, BILATERAL FORAMINOTOMIES, MICRODISCECTOMY LUMBAR FOUR TO LUMBAR FIVE    PT Comments    POD # 2; pt was in bed; log rolling to sit on EOB; required reminder on log rolling to initiate; educate on sit to stand method with minimal forward flexion; ambulate in unit hall 169ft; pt c/o pain radiating down R leg while ambulating; assisted pt to BR (urinal use) notified NT; patient back to bed.   Follow Up Recommendations  Home health PT     Equipment Recommendations  Rolling walker with 5" wheels    Recommendations for Other Services       Precautions / Restrictions Precautions Precautions: Back Restrictions Weight Bearing Restrictions: No    Mobility  Bed Mobility Overal bed mobility: Needs Assistance Bed Mobility: Supine to Sit Rolling: Min assist (Log Rolling; rq Cues reminder to intiate )         General bed mobility comments: Log Rolling required some cues to intiate; min assist  Transfers Overall transfer level: Needs assistance Equipment used: Rolling walker (2 wheeled) Transfers: Sit to/from Stand Sit to Stand: Min assist         General transfer comment: Cues/re-educate on standing up w/o excessive flexion;   Ambulation/Gait Ambulation/Gait assistance: Min assist Ambulation Distance (Feet): 165 Feet Assistive device: Rolling walker (2 wheeled) Gait Pattern/deviations: WFL(Within Functional Limits);Step-through pattern     General Gait Details: during ambulate; C/o pain    Stairs            Wheelchair Mobility    Modified Rankin (Stroke Patients Only)       Balance                                    Cognition Arousal/Alertness: Awake/alert (appeard a bit drowsey from pain meds maybe) Behavior During Therapy: WFL for tasks  assessed/performed Overall Cognitive Status: Within Functional Limits for tasks assessed                      Exercises      General Comments        Pertinent Vitals/Pain Pain Assessment: 0-10 Pain Score: 10-Worst pain ever Pain Location: Right Leg radiating down  Pain Descriptors / Indicators: Aching;Radiating;Constant Pain Intervention(s): Limited activity within patient's tolerance;Monitored during session    Home Living                      Prior Function            PT Goals (current goals can now be found in the care plan section) Progress towards PT goals: Progressing toward goals    Frequency  Min 6X/week    PT Plan      Co-evaluation             End of Session Equipment Utilized During Treatment: Gait belt Activity Tolerance: Patient tolerated treatment well Patient left: in bed;with call bell/phone within reach     Time:  -     Charges:                       G Codes:      Duric,Sreto Student PTA  02/01/2015, 12:32 PM   Reviewed above and agree  Rica Koyanagi  PTA  [facility]  Acute  Rehab Pager      [phone]

## 2015-02-01 NOTE — Discharge Summary (Signed)
Physician Discharge Summary   [name] MRN: [id number] DOB/AGE: 1960/12/08 54 y.o.  Admit date: 01/30/2015 Discharge date: 02/01/2015  Primary Diagnosis:   HERNIATED NUCLEUS PULPOSIS, STENOSIS L4-L5  Admission Diagnoses:  Past Medical History  Diagnosis Date  . Hypertension   . History of tobacco abuse     quit 2008  . Chest pain 04/18/2012  . OSA (obstructive sleep apnea)     cpap - does not know settings    Discharge Diagnoses:   Active Problems:   Spinal stenosis at L4-L5 level  Procedure:  Procedure(s) (LRB): MICRO LUMBAR DECOMPRESSION, BILATERAL FORAMINOTOMIES, MICRODISCECTOMY LUMBAR FOUR TO LUMBAR FIVE (N/A)   Consults: none  HPI:  see H&P    Laboratory Data: Hospital Outpatient Visit on 01/22/2015  Component Date Value Ref Range Status  . Sodium 01/22/2015 140  135 - 145 mmol/L Final  . Potassium 01/22/2015 3.7  3.5 - 5.1 mmol/L Final  . Chloride 01/22/2015 102  96 - 112 mmol/L Final  . CO2 01/22/2015 29  19 - 32 mmol/L Final  . Glucose, Bld 01/22/2015 111* 70 - 99 mg/dL Final  . BUN 01/22/2015 18  6 - 23 mg/dL Final  . Creatinine, Ser 01/22/2015 1.20  0.50 - 1.35 mg/dL Final  . Calcium 01/22/2015 9.3  8.4 - 10.5 mg/dL Final  . GFR calc non Af Amer 01/22/2015 68* >90 mL/min Final  . GFR calc Af Amer 01/22/2015 79* >90 mL/min Final   Comment: (NOTE) The eGFR has been calculated using the CKD EPI equation. This calculation has not been validated in all clinical situations. eGFR's persistently <90 mL/min signify possible Chronic Kidney Disease.   . Anion gap 01/22/2015 9  5 - 15 Final  . WBC 01/22/2015 4.9  4.0 - 10.5 K/uL Final  . RBC 01/22/2015 7.12* 4.22 - 5.81 MIL/uL Final  . Hemoglobin 01/22/2015 14.3  13.0 - 17.0 g/dL Final  . HCT 01/22/2015 46.9  39.0 - 52.0 % Final  . MCV 01/22/2015 65.9* 78.0 - 100.0 fL Final  . MCH 01/22/2015 20.1* 26.0 - 34.0 pg Final  . MCHC 01/22/2015 30.5  30.0 - 36.0 g/dL Final  . RDW 01/22/2015  15.7* 11.5 - 15.5 % Final  . Platelets 01/22/2015 266  150 - 400 K/uL Final  . MRSA, PCR 01/22/2015 NEGATIVE  NEGATIVE Final  . Staphylococcus aureus 01/22/2015 POSITIVE* NEGATIVE Final   Comment:        The Xpert SA Assay (FDA approved for NASAL specimens in patients over 79 years of age), is one component of a comprehensive surveillance program.  Test performance has been validated by [facility] for patients greater than or equal to 48 year old. It is not intended to diagnose infection nor to guide or monitor treatment.     Recent Labs  01/31/15 0508  HGB 13.2    Recent Labs  01/31/15 0508  WBC 9.6  RBC 6.48*  HCT 42.5  PLT 257    Recent Labs  01/31/15 0508  NA 137  K 3.2*  CL 97  CO2 32  BUN 15  CREATININE 1.08  GLUCOSE 125*  CALCIUM 8.8   No results for input(s): LABPT, INR in the last 72 hours.  X-Rays:Dg Lumbar Spine 2-3 Views  01/22/2015   CLINICAL DATA:  Preoperative examination prior to lumbar surgery  EXAM: LUMBAR SPINE - 2-3 VIEW  COMPARISON:  None.  FINDINGS: The lumbar vertebral bodies are preserved in height. There is multilevel disc space narrowing most significant  at L4-5. There is no spondylolisthesis. There is small anterior endplate osteophytes from L2 inferiorly. Large lateral osteophytes are demonstrated from L1 through L4. There is mild facet joint hypertrophy from L2-3 inferiorly. The observed portions of the sacrum are unremarkable.  IMPRESSION: There is multilevel degenerative disc and facet joint disease. There is no compression fracture.   Electronically Signed   By: David  Martinique   On: 01/22/2015 09:50   Dg Spine Portable 1 View  01/30/2015   CLINICAL DATA:  L4-L5 surgical level  EXAM: PORTABLE SPINE - 1 VIEW  COMPARISON:  Portable intraoperative image #3 at 1001 hours compared to 0915 hours  FINDINGS: Exam labeled with 5 lumbar vertebrae.  Metallic probe via dorsal approach projects dorsal to the L4-L5 disc space and superior endplate  of L5 vertebral body.  Tissues spreader projects dorsal to L5.  Multilevel disc space narrowing lumbar spine.  Scattered endplate spurs.  Vertebral body heights maintained.  IMPRESSION: Posterior localization of the L4-L5 disc space and superior endplate of L5.   Electronically Signed   By: Lavonia Dana M.D.   On: 01/30/2015 10:17   Dg Spine Portable 1 View  01/30/2015   CLINICAL DATA:  Intraoperative localization.  EXAM: PORTABLE SPINE - 1 VIEW  COMPARISON:  Earlier exam at 55 hr today.  FINDINGS: Soft tissue retractors are present dorsal to the L5 vertebra. Probe is present in the L5-S1 interspinous space.  IMPRESSION: Intraoperative localization with probe in the interspinous space at L5-S1.   Electronically Signed   By: Dereck Ligas M.D.   On: 01/30/2015 09:39   Dg Spine Portable 1 View  01/30/2015   CLINICAL DATA:  Intra operative localization.  EXAM: PORTABLE SPINE - 1 VIEW  COMPARISON:  01/22/2015 pre-surgical radiographs.  FINDINGS: Localization needle is present dorsal to the spinous processes of L3-L4. The spinal processes were labeled.  IMPRESSION: Intraoperative localization with needle dorsal to the L3-L4 interspinous space.   Electronically Signed   By: Dereck Ligas M.D.   On: 01/30/2015 09:20    EKG: Orders placed or performed in visit on 01/10/15  . Exercise Tolerance Test     Hospital Course: Patient was admitted to [facility] and taken to the OR and underwent the above state procedure without complications.  Patient tolerated the procedure well and was later transferred to the recovery room and then to the orthopaedic floor for postoperative care.  They were given PO and IV analgesics for pain control following their surgery.  They were given 24 hours of postoperative antibiotics.   PT was consulted postop to assist with mobility and transfers.  The patient was allowed to be WBAT with therapy and was taught back precautions. Discharge planning was consulted to help  with postop disposition and equipment needs.  Patient had a fair night on the evening of surgery and started to get up OOB with therapy on day one. Patient was seen in rounds and was ready to go home on day two. They were given discharge instructions and dressing directions.  They were instructed on when to follow up in the office with Dr. Tonita Cong.   Diet: Regular diet Activity:WBAT Follow-up:in 10-14 days Disposition - Home Discharged Condition: good   Discharge Instructions    Call MD / Call 911    Complete by:  As directed   If you experience chest pain or shortness of breath, CALL 911 and be transported to the hospital emergency room.  If you develope a fever above 101  F, pus (white drainage) or increased drainage or redness at the wound, or calf pain, call your surgeon's office.     Constipation Prevention    Complete by:  As directed   Drink plenty of fluids.  Prune juice may be helpful.  You may use a stool softener, such as Colace (over the counter) 100 mg twice a day.  Use MiraLax (over the counter) for constipation as needed.     Diet - low sodium heart healthy    Complete by:  As directed      Increase activity slowly as tolerated    Complete by:  As directed             Medication List    TAKE these medications        amLODipine 10 MG tablet  Commonly known as:  NORVASC  Take 10 mg by mouth every morning.     aspirin EC 81 MG tablet  Take 1 tablet (81 mg total) by mouth every morning. Resume 4 days post-op     docusate sodium 100 MG capsule  Commonly known as:  COLACE  Take 1 capsule (100 mg total) by mouth 2 (two) times daily as needed for mild constipation.     ergocalciferol 50000 UNITS capsule  Commonly known as:  VITAMIN D2  Take 50,000 Units by mouth once a week. Monday.     gabapentin 300 MG capsule  Commonly known as:  NEURONTIN  Take 1 capsule (300 mg total) by mouth 3 (three) times daily.     hydrochlorothiazide 25 MG tablet  Commonly known as:   HYDRODIURIL  Take 25 mg by mouth every morning.     losartan 100 MG tablet  Commonly known as:  COZAAR  Take 100 mg by mouth every evening.     methocarbamol 500 MG tablet  Commonly known as:  ROBAXIN  Take 1 tablet (500 mg total) by mouth every 8 (eight) hours as needed for muscle spasms.     oxyCODONE-acetaminophen 5-325 MG per tablet  Commonly known as:  PERCOCET  Take 1 tablet by mouth every 4 (four) hours as needed.           Follow-up Information    Follow up with BEANE,JEFFREY C, MD In 2 weeks.   Specialty:  Orthopedic Surgery   Why:  For suture removal   Contact information:   [address] [place] [address] [phone]       Signed: Lacie Draft, PA-C Orthopaedic Surgery 02/01/2015, 1:33 PM

## 2015-04-26 ENCOUNTER — Ambulatory Visit (INDEPENDENT_AMBULATORY_CARE_PROVIDER_SITE_OTHER): Payer: BLUE CROSS/BLUE SHIELD | Admitting: Family Medicine

## 2015-04-26 ENCOUNTER — Encounter: Payer: Self-pay | Admitting: Family Medicine

## 2015-04-26 VITALS — BP 130/83 | HR 73 | Temp 97.9°F | Wt 283.0 lb

## 2015-04-26 DIAGNOSIS — G4733 Obstructive sleep apnea (adult) (pediatric): Secondary | ICD-10-CM | POA: Diagnosis not present

## 2015-04-26 DIAGNOSIS — E559 Vitamin D deficiency, unspecified: Secondary | ICD-10-CM

## 2015-04-26 DIAGNOSIS — E669 Obesity, unspecified: Secondary | ICD-10-CM | POA: Diagnosis not present

## 2015-04-26 DIAGNOSIS — N529 Male erectile dysfunction, unspecified: Secondary | ICD-10-CM | POA: Insufficient documentation

## 2015-04-26 DIAGNOSIS — D561 Beta thalassemia: Secondary | ICD-10-CM | POA: Insufficient documentation

## 2015-04-26 DIAGNOSIS — E785 Hyperlipidemia, unspecified: Secondary | ICD-10-CM | POA: Diagnosis not present

## 2015-04-26 DIAGNOSIS — R5383 Other fatigue: Secondary | ICD-10-CM | POA: Diagnosis not present

## 2015-04-26 DIAGNOSIS — D563 Thalassemia minor: Secondary | ICD-10-CM

## 2015-04-26 DIAGNOSIS — M48061 Spinal stenosis, lumbar region without neurogenic claudication: Secondary | ICD-10-CM

## 2015-04-26 DIAGNOSIS — Z6837 Body mass index (BMI) 37.0-37.9, adult: Secondary | ICD-10-CM | POA: Insufficient documentation

## 2015-04-26 DIAGNOSIS — I10 Essential (primary) hypertension: Secondary | ICD-10-CM | POA: Diagnosis not present

## 2015-04-26 NOTE — Progress Notes (Signed)
BP 130/83 mmHg  Pulse 73  Temp(Src) 97.9 F (36.6 C)  Wt 283 lb (128.368 kg)  SpO2 98%   Subjective:    [name], male    DOB: Mar 14, 1961, 54 y.o.   MRN: [id number]  [name] is a 54 y.o. male  Chief Complaint  Patient presents with  . Hypertension  . Hyperlipidemia  . Obesity   Hypertension Checking blood pressure away from here?  yes How often? Few times a month; 144/92 yesterday; has home cuff but not working; checks at [facility] If taking medicines, are you taking them regularly?  yes Siblings / family history: Does high blood pressure run in your family?   YES Salt:  Trying to limit sodium / salt when buying foods at the grocery store?  yes does not frozen dinners Do you try to limit added salt when cooking and at the table?  yes Sweets/licorice:  Do you eat a lot of sweets or eat black licorice?  no Saturated fats: Do you eat a lot of foods like bacon, sausage, pepperoni, cheeseburgers, hot dogs, bologna, and cheese?  no not really; some Sedentary lifestyle: going to do some exercises, back exercises; going to start walking again Steroids/Non-steroidals:  Have you had a recent cortisone shot in the last few months?  no  Do you take prednisone or prescription NSAIDs, no, or take OTCS NSAIDs such as ibuprofen, Motrin, Advil, Aleve, or naproxen? no Smoking: Do you smoke?  no Snoring / sleep apnea: Do you snore or have sleep apnea?  YES  If diagnosed with sleep apnea, do you use your machine?  yes Stroh's (alcohol): Do you drink alcohol  yes  --- if yes, do you drink more than 14 drinks/week if male or more than 7 drinks/week if male?  no Sudafed (decongestants): Do you use any OTC decongestant products like Allegra-D, Claritin-D, Zyrtec-D, Tylenol Cold and Sinus, etc.?  no  He has high cholesterol; no recent cholesterol check; not sure if runs in the family  Obesity Doing a [employer]; slice of cheese, five crackers, on it for  three days, then off for four days; fruits and veggies, nuts He is on the 2nd week; he got up to 288 pounds, then down to 279, then back on a few pounds after eating chicken wings  Vitamin D deficiency; low in the 12 range in February; he has just been taking what's in the multiple vitamin  Relevant past medical, surgical, family and social history reviewed and updated as indicated. Interim medical history since our last visit reviewed. Allergies and medications reviewed and updated.  Review of Systems  Constitutional: Positive for fatigue.   Per HPI unless specifically indicated above     Objective:    BP 130/83 mmHg  Pulse 73  Temp(Src) 97.9 F (36.6 C)  Wt 283 lb (128.368 kg)  SpO2 98%  Wt Readings from Last 3 Encounters:  04/26/15 283 lb (128.368 kg)  01/30/15 276 lb (125.193 kg)  01/22/15 276 lb (125.193 kg)    Physical Exam  Constitutional: He appears well-developed and well-nourished. No distress.  Eyes: EOM are normal. No scleral icterus.  Neck: No thyromegaly present.  Cardiovascular: Normal rate and regular rhythm.   Pulmonary/Chest: Effort normal and breath sounds normal.  Abdominal: He exhibits no distension.  Musculoskeletal: He exhibits no edema.  Neurological: He is alert. Coordination normal.  Skin: Skin is warm and dry. No rash noted. No pallor.  Psychiatric: He has a  normal mood and affect. His behavior is normal. Judgment and thought content normal.      Assessment & Plan:   Problem List Items Addressed This Visit      Cardiovascular and Mediastinum   HTN (hypertension) - Primary (Chronic)    DASH guidelines, weight loss, limiting salt, limit processed foods; he feels it is under control; call me if consistently not under 140/90      Relevant Orders   Basic metabolic panel (Completed)     Respiratory   OSA (obstructive sleep apnea) (Chronic)    Continue to wear CPAP and work on weight        Other   Beta thalassemia minor (Chronic)     Genetic condition; reviewed labs over the last 9 years, relatively unchanged      Spinal stenosis at L4-L5 level    S/p surgery      Obesity    No longer in the morbidly obese category; continue to work on weight loss; I explained that weight loss would help with HTN, sleep apnea, so important to overall health; do it safely; more than 2 pounds per week tends to result in muscle loss and water loss      Hyperlipidemia    Check fasting lipids today      Relevant Orders   Lipid Panel w/o Chol/HDL Ratio (Completed)   Vitamin D deficiency    Replace but not draw blood today (($$$); supplement and recheck later      Fatigue    Check testosterone level      Relevant Orders   Testosterone, free, total (Completed)      Follow up plan: Return in about 6 months (around 10/27/2015) for high blood pressure.  An After Visit Summary was printed and given to the patient.

## 2015-04-26 NOTE — Patient Instructions (Addendum)
Take 2,000 iu of vitamin D3 once a day, that is okay to take in additional to whatever is in your multiple vitamin; continue 2,000 iu for 3-4 months and we can recheck later Your goal blood pressure is less than 140/90 Try to follow the DASH guidelines (DASH stands for Dietary Approaches to Stop Hypertension) Try to limit the sodium in your diet.  Ideally, consume less than 1.5 grams (less than 1,500mg ) per day. Do not add salt when cooking or at the table.  Check the sodium amount on labels when shopping, and choose items lower in sodium when given a choice. Avoid or limit foods that already contain a lot of sodium. Eat a diet rich in fruits and vegetables and whole grains. Keep up the efforts at weight loss; I don't recommend losing more than two pounds per week Return in 6 months

## 2015-04-26 NOTE — Assessment & Plan Note (Addendum)
No longer in the morbidly obese category; continue to work on weight loss; I explained that weight loss would help with HTN, sleep apnea, so important to overall health; do it safely; more than 2 pounds per week tends to result in muscle loss and water loss

## 2015-04-26 NOTE — Assessment & Plan Note (Signed)
Continue to wear CPAP and work on [employer]

## 2015-04-26 NOTE — Assessment & Plan Note (Signed)
Replace but not draw blood today (($$$); supplement and recheck later

## 2015-04-26 NOTE — Assessment & Plan Note (Signed)
Check fasting lipids today 

## 2015-04-26 NOTE — Assessment & Plan Note (Signed)
DASH guidelines, weight loss, limiting salt, limit processed foods; he feels it is under control; call me if consistently not under 140/90

## 2015-04-26 NOTE — Assessment & Plan Note (Signed)
Check testosterone level.  

## 2015-04-27 LAB — BASIC METABOLIC PANEL
BUN / CREAT RATIO: 20 (ref 9–20)
BUN: 24 mg/dL (ref 6–24)
CALCIUM: 9.8 mg/dL (ref 8.7–10.2)
CO2: 26 mmol/L (ref 18–29)
Chloride: 98 mmol/L (ref 97–108)
Creatinine, Ser: 1.21 mg/dL (ref 0.76–1.27)
GFR calc Af Amer: 79 mL/min/{1.73_m2} (ref >59)
GFR calc non Af Amer: 68 mL/min/{1.73_m2} (ref >59)
GLUCOSE: 94 mg/dL (ref 65–99)
POTASSIUM: 3.5 mmol/L (ref 3.5–5.2)
SODIUM: 141 mmol/L (ref 134–144)

## 2015-04-27 LAB — TESTOSTERONE, FREE, TOTAL, SHBG
SEX HORMONE BINDING: 58.6 nmol/L (ref 19.3–76.4)
Testosterone, Free: 5.5 pg/mL — ABNORMAL LOW (ref 7.2–24.0)
Testosterone: 356 ng/dL (ref 348–1197)

## 2015-04-27 LAB — LIPID PANEL W/O CHOL/HDL RATIO
Cholesterol, Total: 158 mg/dL (ref 100–199)
HDL: 42 mg/dL (ref >39)
LDL CALC: 87 mg/dL (ref 0–99)
Triglycerides: 144 mg/dL (ref 0–149)
VLDL Cholesterol Cal: 29 mg/dL (ref 5–40)

## 2015-04-28 NOTE — Assessment & Plan Note (Signed)
Genetic condition; reviewed labs over the last 9 years, relatively unchanged

## 2015-04-28 NOTE — Assessment & Plan Note (Signed)
S/p surgery 

## 2015-05-06 ENCOUNTER — Telehealth: Payer: Self-pay

## 2015-05-06 DIAGNOSIS — R7989 Other specified abnormal findings of blood chemistry: Secondary | ICD-10-CM

## 2015-05-06 DIAGNOSIS — N529 Male erectile dysfunction, unspecified: Secondary | ICD-10-CM

## 2015-05-06 NOTE — Assessment & Plan Note (Signed)
Refer to urologist 

## 2015-05-06 NOTE — Telephone Encounter (Signed)
That's fine

## 2015-05-06 NOTE — Telephone Encounter (Signed)
He called back and decided he would like to go ahead and go to urology. Can you please put in the referral? Thanks!

## 2015-05-06 NOTE — Telephone Encounter (Signed)
Please call pt ASAP

## 2015-05-09 ENCOUNTER — Other Ambulatory Visit: Payer: Self-pay | Admitting: Family Medicine

## 2015-05-09 NOTE — Telephone Encounter (Signed)
Routing to provider  

## 2015-05-13 ENCOUNTER — Telehealth: Payer: Self-pay | Admitting: Family Medicine

## 2015-05-14 ENCOUNTER — Encounter: Payer: Self-pay | Admitting: Urology

## 2015-05-14 ENCOUNTER — Ambulatory Visit (INDEPENDENT_AMBULATORY_CARE_PROVIDER_SITE_OTHER): Payer: BLUE CROSS/BLUE SHIELD | Admitting: Urology

## 2015-05-14 VITALS — BP 135/86 | HR 66 | Resp 18 | Ht 71.0 in | Wt 279.4 lb

## 2015-05-14 DIAGNOSIS — N529 Male erectile dysfunction, unspecified: Secondary | ICD-10-CM

## 2015-05-14 DIAGNOSIS — E291 Testicular hypofunction: Secondary | ICD-10-CM

## 2015-05-14 DIAGNOSIS — R7989 Other specified abnormal findings of blood chemistry: Secondary | ICD-10-CM | POA: Insufficient documentation

## 2015-05-14 DIAGNOSIS — N528 Other male erectile dysfunction: Secondary | ICD-10-CM

## 2015-05-14 DIAGNOSIS — N138 Other obstructive and reflux uropathy: Secondary | ICD-10-CM

## 2015-05-14 DIAGNOSIS — N401 Enlarged prostate with lower urinary tract symptoms: Secondary | ICD-10-CM | POA: Diagnosis not present

## 2015-05-14 LAB — BLADDER SCAN AMB NON-IMAGING

## 2015-05-14 NOTE — Progress Notes (Signed)
05/14/2015 9:18 AM   Marc Stevens Sep 07, 1961 [id number]  Referring provider: Arnetha Courser, MD [address] [place], [place] [address]  Chief Complaint  Patient presents with  . Hypogonadism  . Establish Care    [name] is a 54 year old African American male who is referred to [place] for evaluation for hypogonadism.  He  has not been treated for this in the past.    Patient has been experiencing intermittent fatigue, decrease in strength, erectile dysfunction and a deterioration in his work performance.  He has a good libido.   Patient is having nocturnal tumescence.  Patient had a serum testosterone of 356 ng/dL on 04/26/2015, which is within the normal limit of the labs parameters.    His main complaint is maintaining an erections.  He is not having painful erections or curvature with erections.  He has tried Viagra and Cialis in the past.  "It worked 50/50."  He has been experiencing ED for about two years.       Androgen Deficiency in the Aging Male      05/14/15 0800       Androgen Deficiency in the Aging Male   Do you have a decrease in libido (sex drive) Yes     Do you have lack of energy Yes     Do you have a decrease in strength and/or endurance Yes     Have you lost height No     Have you noticed a decreased "enjoyment of life" No     Are you sad and/or grumpy No     Are your erections less strong Yes     Have you noticed a recent deterioration in your ability to play sports No     Are you falling asleep after dinner No     Has there been a recent deterioration in your work performance Yes            IPSS      05/14/15 0800       International Prostate Symptom Score   How often have you had the sensation of not emptying your bladder? Not at All     How often have you had to urinate less than every two hours? Not at All     How often have you found you stopped and started again several times when you urinated? Less than 1 in 5 times     How  often have you found it difficult to postpone urination? Not at All     How often have you had a weak urinary stream? Less than half the time     How often have you had to strain to start urination? Not at All     How many times did you typically get up at night to urinate? None     Total IPSS Score 3     Quality of Life due to urinary symptoms   If you were to spend the rest of your life with your urinary condition just the way it is now how would you feel about that? Mostly Satisfied        Score:  1-7 Mild 8-19 Moderate 20-35 Severe  PMH: Past Medical History  Diagnosis Date  . History of tobacco abuse     quit 2008  . Chest pain 04/18/2012  . OSA (obstructive sleep apnea)     cpap - does not know settings   . ED (erectile dysfunction)   .  Hyperlipidemia   . HTN (hypertension) 04/18/2012    Surgical History: Past Surgical History  Procedure Laterality Date  . Rotator cuff repair  2007    Left  . Doppler echocardiography  2013  . Left kene arthroscopy     . Lumbar laminectomy/decompression microdiscectomy N/A 01/30/2015    Procedure: MICRO LUMBAR DECOMPRESSION, BILATERAL FORAMINOTOMIES, MICRODISCECTOMY LUMBAR FOUR TO LUMBAR FIVE;  Surgeon: Susa Day, MD;  Location: [facility];  Service: Orthopedics;  Laterality: N/A;    Home Medications:    Medication List       This list is accurate as of: 05/14/15  9:18 AM.  Always use your most recent med list.               amLODipine 10 MG tablet  Commonly known as:  NORVASC  Take 10 mg by mouth every morning.     aspirin EC 81 MG tablet  Take 1 tablet (81 mg total) by mouth every morning. Resume 4 days post-op     hydrochlorothiazide 25 MG tablet  Commonly known as:  HYDRODIURIL  Take 25 mg by mouth every morning.     ketoconazole 2 % cream  Commonly known as:  NIZORAL  Apply topically daily as needed.     losartan 100 MG tablet  Commonly known as:  COZAAR  Take 100 mg by mouth every evening.     multivitamin  tablet  Take 1 tablet by mouth daily.        Allergies: No Known Allergies  Family History: Family History  Problem Relation Age of Onset  . Dementia Mother   . Hypertension Mother     Social History:  reports that he quit smoking about 9 years ago. He has never used smokeless tobacco. He reports that he drinks alcohol. He reports that he does not use illicit drugs.  ROS: UROLOGY Frequent Urination?: No Hard to postpone urination?: No Burning/pain with urination?: No Get up at night to urinate?: No Leakage of urine?: No Urine stream starts and stops?: No Trouble starting stream?: No Do you have to strain to urinate?: No Blood in urine?: No Urinary tract infection?: No Sexually transmitted disease?: No Injury to kidneys or bladder?: No Painful intercourse?: No Weak stream?: No Erection problems?: Yes Penile pain?: No  Gastrointestinal Nausea?: No Vomiting?: No Indigestion/heartburn?: No Diarrhea?: No Constipation?: No  Constitutional Fever: No Night sweats?: No Weight loss?: No Fatigue?: No  Skin Skin rash/lesions?: No Itching?: No  Eyes Blurred vision?: No Double vision?: No  Ears/Nose/Throat Sore throat?: No Sinus problems?: No  Hematologic/Lymphatic Swollen glands?: No Easy bruising?: No  Cardiovascular Leg swelling?: No Chest pain?: No  Respiratory Cough?: No Shortness of breath?: No  Endocrine Excessive thirst?: No  Musculoskeletal Back pain?: Yes Joint pain?: No  Neurological Headaches?: No Dizziness?: No  Psychologic Depression?: No Anxiety?: No  Physical Exam: BP 135/86 mmHg  Pulse 66  Resp 18  Ht 5\' 11"  (1.803 m)  Wt 279 lb 6.4 oz (126.735 kg)  BMI 38.99 kg/m2  Constitutional:  Alert and oriented, No acute distress. HEENT: [place] AT, moist mucus membranes.  Trachea midline, no masses. Cardiovascular: No clubbing, cyanosis, or edema. Respiratory: Normal respiratory effort, no increased work of breathing. GI: Abdomen  is soft, nontender, nondistended, no abdominal masses GU: No CVA tenderness. GU: Patient with circumcised phallus.  Urethral meatus is patent.  No penile discharge. No penile lesions or rashes. Scrotum without lesions, cysts, rashes and/or edema.  Testicles are located scrotally bilaterally. No masses  are appreciated in the testicles. Left and right epididymis are normal. Rectal: Patient with  normal sphincter tone. Perineum without scarring or rashes. No rectal masses are appreciated. Prostate is approximately 50 ( could not palpate entire due to buttocks tissue)  grams, no nodules are appreciated. Seminal vesicles are normal. Skin: No rashes, bruises or suspicious lesions. Lymph: No cervical or inguinal adenopathy. Neurologic: Grossly intact, no focal deficits, moving all 4 extremities. Psychiatric: Normal mood and affect.  Laboratory Data: Results for orders placed or performed in visit on 05/14/15  BLADDER SCAN AMB NON-IMAGING  Result Value Ref Range   Scan Result 2ml`    Lab Results  Component Value Date   WBC 9.6 01/31/2015   HGB 13.2 01/31/2015   HCT 42.5 01/31/2015   MCV 65.6* 01/31/2015   PLT 257 01/31/2015    Lab Results  Component Value Date   CREATININE 1.21 04/26/2015    No results found for: PSA  Lab Results  Component Value Date   TESTOSTERONE 356 04/26/2015    Lab Results  Component Value Date   HGBA1C 5.9* 04/18/2012    Urinalysis    Component Value Date/Time   COLORURINE YELLOW 04/17/2012 2222   APPEARANCEUR CLEAR 04/17/2012 2222   LABSPEC 1.026 04/17/2012 2222   PHURINE 5.5 04/17/2012 2222   GLUCOSEU NEGATIVE 04/17/2012 2222   HGBUR NEGATIVE 04/17/2012 2222   BILIRUBINUR NEGATIVE 04/17/2012 2222   KETONESUR NEGATIVE 04/17/2012 2222   PROTEINUR NEGATIVE 04/17/2012 2222   UROBILINOGEN 1.0 04/17/2012 2222   NITRITE NEGATIVE 04/17/2012 2222   LEUKOCYTESUR SMALL* 04/17/2012 2222    Pertinent Imaging:   Assessment & Plan:    1. Erectile  dysfunction:   When questioning the patient concerning his symptoms, he indicated that he had good libido but having difficulty maintaining erections.  I explained to the patient that testosterone therapy is not indicated for the treatment of ED and that many men with normal testosterone levels still experience ED.    He has tried PDE5-inhibitors in the past with some mild improvement.  I have given him Cialis samples today to see if he has better success with his erections.  2. Low testosterone level:   Patient's serum testosterone level obtained at his PCP's office was within the normal parameters of the lab.  I explained to the patient that in order to diagnose hypogonadism, we would need two total testosterone levels drawn before 9:00 am.  If those levels returned low, we would need to do additional blood work.  I discussed with the patient the side effects of testosterone therapy, such as: enlargement of the prostate gland that may in turn cause LUTS, possible increased risk of PCa, DVT's and/or PE's, possible increased risk of heart attack or stroke, lower sperm count, swelling of the ankles, feet, or body, with or without heart failure, enlarged or painful breasts, have problems breathing while you sleep (sleep apnea), increased prostate specific antigen, mood swings, hypertension and  increased red blood cell count.  I also reemphasized that if he was hypogonadal and he did decide to undergo testosterone therapy, it may not improve his erections.    3. BPH with LUTS:   Patient's IPSS score is 3/2.  His PVR 44 mL.  His DRE demonstrates enlargement.  PSA is drawn today.  Follow up pending labs.     There are no diagnoses linked to this encounter.  No Follow-up on file.  Zara Council, [facility] [address], [place],  [place] [address] [phone]

## 2015-05-15 ENCOUNTER — Telehealth: Payer: Self-pay

## 2015-05-15 LAB — TESTOSTERONE: Testosterone: 444 ng/dL (ref 348–1197)

## 2015-05-15 LAB — PSA: PROSTATE SPECIFIC AG, SERUM: 1.8 ng/mL (ref 0.0–4.0)

## 2015-05-15 LAB — HEMATOCRIT: Hematocrit: 43.5 % (ref 37.5–51.0)

## 2015-05-15 NOTE — Telephone Encounter (Signed)
-----   Message from Nori Riis, PA-C sent at 05/15/2015  9:07 AM EDT ----- Patient's testosterone is normal. He does not have hypogonadism. His other labs were normal as well. We will need to see him in 1 month's time to discuss the efficacy of the Cialis.

## 2015-05-15 NOTE — Telephone Encounter (Signed)
Spoke with pt in reference to lab results. Pt voiced understanding. Pt was transferred to the front to make f/u appt.

## 2015-06-02 ENCOUNTER — Other Ambulatory Visit: Payer: Self-pay | Admitting: Family Medicine

## 2015-06-05 ENCOUNTER — Encounter: Payer: Self-pay | Admitting: Family Medicine

## 2015-06-05 ENCOUNTER — Ambulatory Visit (INDEPENDENT_AMBULATORY_CARE_PROVIDER_SITE_OTHER): Payer: BLUE CROSS/BLUE SHIELD | Admitting: Family Medicine

## 2015-06-05 VITALS — BP 148/86 | HR 60 | Temp 97.6°F | Ht 70.25 in | Wt 277.0 lb

## 2015-06-05 DIAGNOSIS — N528 Other male erectile dysfunction: Secondary | ICD-10-CM | POA: Diagnosis not present

## 2015-06-05 DIAGNOSIS — G4733 Obstructive sleep apnea (adult) (pediatric): Secondary | ICD-10-CM | POA: Diagnosis not present

## 2015-06-05 DIAGNOSIS — I10 Essential (primary) hypertension: Secondary | ICD-10-CM | POA: Diagnosis not present

## 2015-06-05 MED ORDER — HYDRALAZINE HCL 25 MG PO TABS: 25.0000 mg | ORAL_TABLET | Freq: Three times a day (TID) | ORAL | Status: DC

## 2015-06-05 MED ORDER — SILDENAFIL CITRATE 100 MG PO TABS: 50.0000 mg | ORAL_TABLET | Freq: Every day | ORAL | Status: DC | PRN

## 2015-06-05 NOTE — Patient Instructions (Addendum)
Try to use PLAIN allergy medicine without the decongestant Avoid: phenylephrine, phenylpropanolamine, and pseudoephredine Your goal blood pressure is less than 140 mmHg on top. Try to follow the DASH guidelines (DASH stands for Dietary Approaches to Stop Hypertension) Try to limit the sodium in your diet.  Ideally, consume less than 1.5 grams (less than 1,500mg ) per day. Do not add salt when cooking or at the table.  Check the sodium amount on labels when shopping, and choose items lower in sodium when given a choice. Avoid or limit foods that already contain a lot of sodium. Eat a diet rich in fruits and vegetables and whole grains. Get back on the CPAP Monitor your blood pressure and let me know your numbers after 2 weeks (just call, no appt needed)

## 2015-06-05 NOTE — Progress Notes (Signed)
BP 148/86 mmHg  Pulse 60  Temp(Src) 97.6 F (36.4 C)  Ht 5' 10.25" (1.784 m)  Wt 277 lb (125.646 kg)  BMI 39.48 kg/m2  SpO2 97%   Subjective:    [name], male    DOB: 06-May-1961, 54 y.o.   MRN: [id number]  [name] is a 54 y.o. male  Chief Complaint  Patient presents with  . Hypertension   He is here for an acute visit; checked BP in the last few weeks and noticed it has been high No symptoms, no headaches, no pounding in the head, no visual changes Taking medicines every day [facility] readings reviewed  Has sleep apnea but has not been using it regularly He does not use salt shaker Walks three times a week 2200 kcal diet for 3 weeks; has lost weight; got up to 285 pounds  Mother had high blood pressure Sister has high blood pressure (older), has had high blood pressure Not sure if father has high blood pressure  Issues with erectile dysfunction; he has seen urologist; she does not think the ED is ; not having morning erections; firmness is no where near what it should be; Cialis did not help; interested in trying Viagra  Relevant past medical, surgical, family and social history reviewed and updated as indicated. Interim medical history since our last visit reviewed. Father had a stroke, he is not sure; sister knows more about that; no heart attacks  Allergies and medications reviewed and updated.  Review of Systems  Cardiovascular: Negative for chest pain, palpitations and leg swelling.    Per HPI unless specifically indicated above     Objective:    BP 148/86 mmHg  Pulse 60  Temp(Src) 97.6 F (36.4 C)  Ht 5' 10.25" (1.784 m)  Wt 277 lb (125.646 kg)  BMI 39.48 kg/m2  SpO2 97%  Wt Readings from Last 3 Encounters:  06/05/15 277 lb (125.646 kg)  05/14/15 279 lb 6.4 oz (126.735 kg)  04/26/15 283 lb (128.368 kg)    Physical Exam  Constitutional: He appears well-developed and well-nourished.  Eyes: No scleral icterus.   Cardiovascular: Normal rate and regular rhythm.   Pulmonary/Chest: Effort normal and breath sounds normal.  Musculoskeletal: He exhibits no edema.  Neurological: He is alert.  Skin: Skin is warm and dry.  Psychiatric: He has a normal mood and affect. His behavior is normal. Judgment and thought content normal.    Results for orders placed or performed in visit on 05/14/15  Testosterone  Result Value Ref Range   Testosterone 444 348 - 1197 ng/dL   Comment, Testosterone Comment   PSA  Result Value Ref Range   Prostate Specific Ag, Serum 1.8 0.0 - 4.0 ng/mL  Hematocrit  Result Value Ref Range   Hematocrit 43.5 37.5 - 51.0 %  BLADDER SCAN AMB NON-IMAGING  Result Value Ref Range   Scan Result 60ml`       Assessment & Plan:   Problem List Items Addressed This Visit      Cardiovascular and Mediastinum   HTN (hypertension) - Primary (Chronic)    Discussed options; he'll work on weight loss, healthy eating, DASH guidelines; continue meds; monitor BP and call me in a couple of weeks with numbers; discussed risk of stroke      Relevant Medications   hydrALAZINE (APRESOLINE) 25 MG tablet   sildenafil (VIAGRA) 100 MG tablet     Respiratory   OSA (obstructive sleep apnea) (Chronic)  Encouraged him to start using the CPAP, as untreated OSA can certainly influence hypertension        Genitourinary   ED (erectile dysfunction)    Will try Viagra, samples given; 100 mg may be cut in half; discussed use, recommendations for best outcome (don't take on full stomach with alcohol, e.g.)         Follow up plan: Return if symptoms worsen or fail to improve. An after-visit summary was printed and given to the patient at [facility].  Please see the patient instructions which may contain other information and recommendations beyond what is mentioned above in the assessment and plan.

## 2015-06-07 NOTE — Assessment & Plan Note (Signed)
Will try Viagra, samples given; 100 mg may be cut in half; discussed use, recommendations for best outcome (don't take on full stomach with alcohol, e.g.)

## 2015-06-07 NOTE — Assessment & Plan Note (Signed)
Encouraged him to start using the CPAP, as untreated OSA can certainly influence hypertension

## 2015-06-07 NOTE — Assessment & Plan Note (Signed)
Discussed options; he'll work on weight loss, healthy eating, DASH guidelines; continue meds; monitor BP and call me in a couple of weeks with numbers; discussed risk of stroke

## 2015-06-14 ENCOUNTER — Ambulatory Visit (INDEPENDENT_AMBULATORY_CARE_PROVIDER_SITE_OTHER): Payer: BLUE CROSS/BLUE SHIELD | Admitting: Urology

## 2015-06-14 ENCOUNTER — Encounter: Payer: Self-pay | Admitting: Urology

## 2015-06-14 VITALS — BP 136/80 | HR 72 | Ht 71.0 in | Wt 276.9 lb

## 2015-06-14 DIAGNOSIS — E291 Testicular hypofunction: Secondary | ICD-10-CM | POA: Diagnosis not present

## 2015-06-14 DIAGNOSIS — N401 Enlarged prostate with lower urinary tract symptoms: Secondary | ICD-10-CM

## 2015-06-14 DIAGNOSIS — N528 Other male erectile dysfunction: Secondary | ICD-10-CM

## 2015-06-14 DIAGNOSIS — N138 Other obstructive and reflux uropathy: Secondary | ICD-10-CM

## 2015-06-14 DIAGNOSIS — R7989 Other specified abnormal findings of blood chemistry: Secondary | ICD-10-CM

## 2015-06-14 DIAGNOSIS — N529 Male erectile dysfunction, unspecified: Secondary | ICD-10-CM

## 2015-06-14 MED ORDER — SILDENAFIL CITRATE 20 MG PO TABS: 20.0000 mg | ORAL_TABLET | Freq: Every day | ORAL | Status: DC | PRN

## 2015-06-17 ENCOUNTER — Ambulatory Visit: Payer: Self-pay | Admitting: Orthopedic Surgery

## 2015-06-22 DIAGNOSIS — N529 Male erectile dysfunction, unspecified: Secondary | ICD-10-CM | POA: Insufficient documentation

## 2015-06-22 NOTE — Progress Notes (Signed)
1:11 PM   Marc Stevens Aug 16, 1961 [id number]  Referring provider: Arnetha Courser, MD [address] [place], [place] [address]  Chief Complaint  Patient presents with  . Erectile Dysfunction    one month follow up  . Benign Prostatic Hypertrophy    HPI: Patient is a 54 year old African-American male who is having symptomatology of hypogonadism, specifically erectile dysfunction, and mild BPH with LUTS who presents today for 1 month follow-up after given samples of Cialis 20 mg on-demand dosage for his erections.  Patient is having nocturnal tumescence.  Patient had a serum testosterone of 356 ng/dL on 04/26/2015 and 444 ng/dL on 07/26/016 which is within the normal limit of the labs parameters.  His main complaint is maintaining an erections.  He is not having painful erections or curvature with erections.  He has tried Viagra and Cialis in the past.  "It worked 50/50."  He has been experiencing ED for about two years.   He found the samples of Cialis given to him at his last visit effective, but found that cost prohibitive.  His SHIM score is 12, which is mild-moderate ED.  His major complaint is maintaining an erection.     His risk factors for ED are HTN, sleep apnea, BPH, obesity, hyperlipidemia and age.  He denies any painful erections or curvatures with his erections.   He has tried PDE5-inhibitors in the past.        SHIM      06/14/15 0919       SHIM: Over the last 6 months:   How do you rate your confidence that you could get and keep an erection? Low     When you had erections with sexual stimulation, how often were your erections hard enough for penetration (entering your partner)? A Few Times (much less than half the time)     During sexual intercourse, how often were you able to maintain your erection after you had penetrated (entered) your partner? Very Difficult     During sexual intercourse, how difficult was it to maintain your erection to completion of  intercourse? Difficult     When you attempted sexual intercourse, how often was it satisfactory for you? Difficult     SHIM Total Score   SHIM 12        Score: 1-7 Severe ED 8-11 Moderate ED 12-16 Mild-Moderate ED 17-21 Mild ED 22-25 No ED  PMH: Past Medical History  Diagnosis Date  . History of tobacco abuse     quit 2008  . Chest pain 04/18/2012  . OSA (obstructive sleep apnea)     cpap - does not know settings   . ED (erectile dysfunction)   . Hyperlipidemia   . HTN (hypertension) 04/18/2012  . Low testosterone   . BPH (benign prostatic hypertrophy)     Surgical History: Past Surgical History  Procedure Laterality Date  . Rotator cuff repair  2007    Left  . Doppler echocardiography  2013  . Left kene arthroscopy     . Lumbar laminectomy/decompression microdiscectomy N/A 01/30/2015    Procedure: MICRO LUMBAR DECOMPRESSION, BILATERAL FORAMINOTOMIES, MICRODISCECTOMY LUMBAR FOUR TO LUMBAR FIVE;  Surgeon: Susa Day, MD;  Location: [facility];  Service: Orthopedics;  Laterality: N/A;    Home Medications:    Medication List       This list is accurate as of: 06/14/15 11:59 PM.  Always use your most recent med list.  amLODipine 10 MG tablet  Commonly known as:  NORVASC  Take 10 mg by mouth every morning.     aspirin EC 81 MG tablet  Take 1 tablet (81 mg total) by mouth every morning. Resume 4 days post-op     FISH OIL PO  Take by mouth daily.     GLUCOSAMINE HCL PO  Take by mouth daily.     hydrALAZINE 25 MG tablet  Commonly known as:  APRESOLINE  Take 1 tablet (25 mg total) by mouth 3 (three) times daily.     ketoconazole 2 % cream  Commonly known as:  NIZORAL  Apply topically daily as needed.     losartan 100 MG tablet  Commonly known as:  COZAAR  Take 100 mg by mouth every evening.     multivitamin tablet  Take 1 tablet by mouth daily.     sildenafil 100 MG tablet  Commonly known as:  VIAGRA  Take 0.5-1 tablets (50-100 mg total)  by mouth daily as needed for erectile dysfunction.     sildenafil 20 MG tablet  Commonly known as:  REVATIO  Take 1 tablet (20 mg total) by mouth 5 (five) times daily as needed. Take 2 to 5 tablets 30 minutes prior to intercourse on an empty stomach        Allergies: No Known Allergies  Family History: Family History  Problem Relation Age of Onset  . Dementia Mother   . Hypertension Mother   . Kidney disease Neg Hx   . Prostate cancer Neg Hx     Social History:  reports that he quit smoking about 9 years ago. He has never used smokeless tobacco. He reports that he drinks alcohol. He reports that he does not use illicit drugs.  ROS: UROLOGY Frequent Urination?: No Hard to postpone urination?: No Burning/pain with urination?: No Get up at night to urinate?: No Leakage of urine?: No Urine stream starts and stops?: No Trouble starting stream?: No Do you have to strain to urinate?: No Blood in urine?: No Urinary tract infection?: No Sexually transmitted disease?: No Injury to kidneys or bladder?: No Painful intercourse?: No Weak stream?: No Erection problems?: Yes Penile pain?: No  Gastrointestinal Nausea?: No Vomiting?: No Indigestion/heartburn?: No Diarrhea?: No Constipation?: No  Constitutional Fever: No Night sweats?: No Weight loss?: No Fatigue?: No  Skin Skin rash/lesions?: No Itching?: No  Eyes Blurred vision?: No Double vision?: No  Ears/Nose/Throat Sore throat?: No Sinus problems?: No  Hematologic/Lymphatic Swollen glands?: No Easy bruising?: No  Cardiovascular Leg swelling?: No Chest pain?: No  Respiratory Cough?: No Shortness of breath?: No  Endocrine Excessive thirst?: No  Musculoskeletal Back pain?: No Joint pain?: No  Neurological Headaches?: No Dizziness?: No  Psychologic Depression?: No Anxiety?: No  Physical Exam: Blood pressure 136/80, pulse 72, height 5\' 11"  (1.803 m), weight 276 lb 14.4 oz (125.601  kg).  Laboratory Data: Results for orders placed or performed in visit on 05/14/15  Testosterone  Result Value Ref Range   Testosterone 444 348 - 1197 ng/dL   Comment, Testosterone Comment   PSA  Result Value Ref Range   Prostate Specific Ag, Serum 1.8 0.0 - 4.0 ng/mL  Hematocrit  Result Value Ref Range   Hematocrit 43.5 37.5 - 51.0 %  BLADDER SCAN AMB NON-IMAGING  Result Value Ref Range   Scan Result 74ml`    Lab Results  Component Value Date   WBC 9.6 01/31/2015   HGB 13.2 01/31/2015   HCT 43.5 05/14/2015  MCV 65.6* 01/31/2015   PLT 257 01/31/2015    Lab Results  Component Value Date   CREATININE 1.21 04/26/2015    Lab Results  Component Value Date   PSA 1.8 05/14/2015    Lab Results  Component Value Date   TESTOSTERONE 444 05/14/2015   Assessment & Plan:    1. Erectile dysfunction:   Patient found the samples of Cialis effective in helping him maintain his erections for adequate intercourse, but he found that cost prohibitive. We discussed the use of sildenafil which is the generic molecule of Viagra. I advised him he needed to take 3-5 tablets prior to intercourse on an empty stomach for effectiveness. He would like a prescription for this medication. I printed them out a prescription for the sildenafil so that he may find a pharmacy that would provide the medication for him at a reasonable cost.  He will follow-up in one year time for [facility] IM score and symptom recheck.  2. Low testosterone level:   Patient was found to be eugonadal with two morning total testosterones within normal laboratory parameters. We will readdress his symptomatology at his return visit in one year as testosterone levels are expected to decline with age.  3. BPH with LUTS:   Patient's IPSS score is 3/2.  His PVR 44 mL.  His DRE demonstrates enlargement.  PSA was 1.8 on 05/14/2015. He will return in 1 year for IPSS score, DRE and PSA.    There are no diagnoses linked to this  encounter.  Return in about 1 year (around 06/13/2016) for SHIM, IPSS and PVR.  Zara Council, [facility] [address], [place], [place] [address] [phone]

## 2015-06-28 ENCOUNTER — Telehealth: Payer: Self-pay | Admitting: Family Medicine

## 2015-06-28 MED ORDER — HYDRALAZINE HCL 25 MG PO TABS: 50.0000 mg | ORAL_TABLET | Freq: Three times a day (TID) | ORAL | Status: DC

## 2015-06-28 NOTE — Telephone Encounter (Signed)
Routing to provider, please advise.

## 2015-06-28 NOTE — Telephone Encounter (Signed)
Pt called in and said hi BP is still not coming down and it only goes down to average at 147/86 he is also now having a daily headache. Pt has appt for 9/22 but would like a call back to know if there is something else he could try to do until his appt.

## 2015-06-28 NOTE — Telephone Encounter (Signed)
Patient notified

## 2015-06-28 NOTE — Telephone Encounter (Signed)
Have him increase his hydralazine from 25 mg to 50 mg three times a day (two pills three times a day) Continue the other meds Call back early next week with update

## 2015-07-04 ENCOUNTER — Telehealth: Payer: Self-pay | Admitting: Family Medicine

## 2015-07-04 DIAGNOSIS — I10 Essential (primary) hypertension: Secondary | ICD-10-CM

## 2015-07-04 NOTE — Assessment & Plan Note (Signed)
Will refer to nephrology for evaluation Continue current medicines; try DASH guidelines

## 2015-07-04 NOTE — Telephone Encounter (Signed)
Patient called and stated that he needed to call back with BP reading for this week. If you can call him back and discuss his BP readings, thanks.  Monday-148/88  Thursday-147/87  [phone]

## 2015-07-04 NOTE — Telephone Encounter (Deleted)
If you can please get his readings and document them, any symptoms, etc.; thank you; I'll call back when I get a chance later today

## 2015-07-04 NOTE — Telephone Encounter (Signed)
Just rough and raw on the side after intercourse the other night, maybe some dryness from friction, partner was dry, neosporin and polysporin, astroglide or ky jelly for partner in future if needed Discussed high BP; he did not tolerate HCTZ; not really interested in beta-blocker, already has erectile dysfunction; refer to nephrologist

## 2015-07-05 ENCOUNTER — Telehealth: Payer: Self-pay | Admitting: Family Medicine

## 2015-07-05 DIAGNOSIS — G4733 Obstructive sleep apnea (adult) (pediatric): Secondary | ICD-10-CM

## 2015-07-05 NOTE — Telephone Encounter (Signed)
Pt needs a rx for cpap machine and wasn't sure if he needed to come in for appt or if it could just be picked up without an appt.

## 2015-07-08 NOTE — Telephone Encounter (Signed)
Routing to provider. Does this need an appointment?

## 2015-07-08 NOTE — Telephone Encounter (Signed)
This visit was closed, so I re-opened it. Please find out when his last sleep study was. We need to know the settings for the CPAP.

## 2015-07-08 NOTE — Telephone Encounter (Signed)
I found a sleep study from 2008 in [facility]; setting is 15 cm H20; check with patient and see if that sounds right (15) If so, I'll be glad to get that ordered However, if any question or if his weight has changed by more than 15 to 20 pounds since 2008 test, let's get a CPAP titration sleep study Thank you!

## 2015-07-09 NOTE — Telephone Encounter (Signed)
Left message to call.

## 2015-07-09 NOTE — Assessment & Plan Note (Signed)
Ordering split night for CPAP settings

## 2015-07-09 NOTE — Telephone Encounter (Signed)
I spoke with patient. He stated that he had the last one in 2008 and he is at a setting of 15, but he has gained over 20 pounds since that one. He is ok with going ahead and getting an updated sleep study. Will you please put that order in? Thanks!

## 2015-07-09 NOTE — Addendum Note (Signed)
Addended by: LADA, Satira Anis on: 07/09/2015 02:06 PM   Modules accepted: Orders

## 2015-07-09 NOTE — Telephone Encounter (Signed)
New order placed; done

## 2015-07-11 ENCOUNTER — Other Ambulatory Visit: Payer: Self-pay | Admitting: Family Medicine

## 2015-07-11 ENCOUNTER — Telehealth: Payer: Self-pay

## 2015-07-11 ENCOUNTER — Ambulatory Visit (INDEPENDENT_AMBULATORY_CARE_PROVIDER_SITE_OTHER): Payer: BLUE CROSS/BLUE SHIELD | Admitting: Family Medicine

## 2015-07-11 ENCOUNTER — Encounter: Payer: Self-pay | Admitting: Family Medicine

## 2015-07-11 VITALS — BP 143/83 | HR 72 | Temp 97.6°F | Wt 273.0 lb

## 2015-07-11 DIAGNOSIS — G4733 Obstructive sleep apnea (adult) (pediatric): Secondary | ICD-10-CM | POA: Diagnosis not present

## 2015-07-11 DIAGNOSIS — S86019A Strain of unspecified Achilles tendon, initial encounter: Secondary | ICD-10-CM

## 2015-07-11 DIAGNOSIS — M7662 Achilles tendinitis, left leg: Secondary | ICD-10-CM

## 2015-07-11 DIAGNOSIS — I10 Essential (primary) hypertension: Secondary | ICD-10-CM | POA: Diagnosis not present

## 2015-07-11 HISTORY — DX: Strain of unspecified achilles tendon, initial encounter: S86.019A

## 2015-07-11 MED ORDER — AMLODIPINE BESYLATE 10 MG PO TABS: 10.0000 mg | ORAL_TABLET | Freq: Every morning | ORAL | Status: DC

## 2015-07-11 NOTE — Telephone Encounter (Signed)
Ok per Dr.Lada.

## 2015-07-11 NOTE — Progress Notes (Signed)
BP 143/83 mmHg  Pulse 72  Temp(Src) 97.6 F (36.4 C)  Wt 273 lb (123.832 kg)  SpO2 98%   Subjective:    [name], male    DOB: July 24, 1961, 54 y.o.   MRN: [id number]  [name] is a 54 y.o. male  Chief Complaint  Patient presents with  . Hypertension   Patient here for high blood pressure; he is on max CCB, max ARB, and has had his hydralazine increased to 50 mg TID recently His last BP was 147/81 He is working on weight loss, trying to eat healthier He is eating a 2200 kcal Drinking plenty of water No real challenges with weight loss, except maybe morning is tough, has to get up and cook eggs and slice up ham, cuts up avocado Doing mostly walking, at least 6,000 steps at work; had 21,000 steps one day  Left achilles tendon, heel area hurting for 1-1/2 weeks; thinking about heel lifts  Saw urologist for erectile dysfunction  Relevant past medical, surgical, family and social history reviewed and updated as indicated. Interim medical history since our last visit reviewed. Allergies and medications reviewed and updated.  Review of Systems  Per HPI unless specifically indicated above     Objective:    BP 143/83 mmHg  Pulse 72  Temp(Src) 97.6 F (36.4 C)  Wt 273 lb (123.832 kg)  SpO2 98%  Wt Readings from Last 3 Encounters:  07/11/15 273 lb (123.832 kg)  06/14/15 276 lb 14.4 oz (125.601 kg)  06/05/15 277 lb (125.646 kg)    Physical Exam  Constitutional: He appears well-developed and well-nourished. No distress.  HENT:  Head: Normocephalic and atraumatic.  Eyes: EOM are normal. No scleral icterus.  Neck: No thyromegaly present.  Cardiovascular: Normal rate and regular rhythm.   Pulmonary/Chest: Effort normal and breath sounds normal.  Abdominal: Soft. Bowel sounds are normal. He exhibits no distension.  Musculoskeletal: He exhibits no edema.       Left ankle: He exhibits swelling. He exhibits normal range of motion and  no deformity. Achilles tendon exhibits pain.       Left foot: There is no tenderness and no swelling.  Tenderness over Achilles tendon on the left; mild swelling  Neurological: Coordination normal.  Skin: Skin is warm and dry. No pallor.  Psychiatric: He has a normal mood and affect. His behavior is normal. Judgment and thought content normal.    Results for orders placed or performed in visit on 05/14/15  Testosterone  Result Value Ref Range   Testosterone 444 348 - 1197 ng/dL   Comment, Testosterone Comment   PSA  Result Value Ref Range   Prostate Specific Ag, Serum 1.8 0.0 - 4.0 ng/mL  Hematocrit  Result Value Ref Range   Hematocrit 43.5 37.5 - 51.0 %  BLADDER SCAN AMB NON-IMAGING  Result Value Ref Range   Scan Result 20ml`       Assessment & Plan:   Problem List Items Addressed This Visit      Cardiovascular and Mediastinum   HTN (hypertension) (Chronic)    Better control, but not to goal; he will see nephrologist soon to help [place] evaluate and treat; patient will ideally lose significant weight in the coming months and we would both love to be able to reduce BP meds rather than add more over the coming months or years; DASH guidelines; avoidance of NSAIDs; avoidance of decongestants        Respiratory  OSA (obstructive sleep apnea) (Chronic)    Last sleep study was 2008; I think getting back on regular CPAP use will also help with blood pressure control; will get CPAP auto-titration per recommendation of Lyndal Rainbow, RT; note to staff to set this up        Musculoskeletal and Integument   Tendonitis, Achilles, left - Primary    patient opts to try conservative therapy, ice, turmeric, modified activity; may refer to podiatrist or ortho if fails conservative therapy          Follow up plan: Return in about 6 months (around 01/08/2016) for 3-6 months for f/u.  An after-visit summary was printed and given to the patient at [facility].  Please see the patient  instructions which may contain other information and recommendations beyond what is mentioned above in the assessment and plan.

## 2015-07-11 NOTE — Assessment & Plan Note (Signed)
patient opts to try conservative therapy, ice, turmeric, modified activity; may refer to podiatrist or ortho if fails conservative therapy

## 2015-07-11 NOTE — Telephone Encounter (Signed)
Routing to provider  

## 2015-07-11 NOTE — Patient Instructions (Addendum)
Try turmeric as a natural anti-inflammatory (for pain and arthritis). It comes in capsules where you buy aspirin and fish oil, but also as a spice where you buy pepper and garlic powder. Keep up the great efforts at weight loss We'll see if the kidney doctor does any additional testing Monitor your blood pressure and call me if not under 140/90 Try to use PLAIN allergy medicine without the decongestant Avoid: phenylephrine, phenylpropanolamine, and pseudoephredine Return in 3-6 months depending on how you're doing Avoid hospitals, nursing homes, and daycares during flu season Try 1000 mg of vitamin C daily during flu season   Achilles Tendinitis Achilles tendinitis is inflammation of the tough, cord-like band that attaches the lower muscles of your leg to your heel (Achilles tendon). It is usually caused by overusing the tendon and joint involved.  CAUSES Achilles tendinitis can happen because of:  A sudden increase in exercise or activity (such as running).  Doing the same exercises or activities (such as jumping) over and over.  Not warming up calf muscles before exercising.  Exercising in shoes that are worn out or not made for exercise.  Having arthritis or a bone growth on the back of the heel bone. This can rub against the tendon and hurt the tendon. SIGNS AND SYMPTOMS The most common symptoms are:  Pain in the back of the leg, just above the heel. The pain usually gets worse with exercise and better with rest.  Stiffness or soreness in the back of the leg, especially in the morning.  Swelling of the skin over the Achilles tendon.  Trouble standing on tiptoe. Sometimes, an Achilles tendon tears (ruptures). Symptoms of an Achilles tendon rupture can include:  Sudden, severe pain in the back of the leg.  Trouble putting weight on the foot or walking normally. DIAGNOSIS Achilles tendinitis will be diagnosed based on symptoms and a physical examination. An X-ray may be done  to check if another condition is causing your symptoms. An MRI may be ordered if your health care provider suspects you may have completely torn your tendon, which is called an Achilles tendon rupture.  TREATMENT  Achilles tendinitis usually gets better over time. It can take weeks to months to heal completely. Treatment focuses on treating the symptoms and helping the injury heal. HOME CARE INSTRUCTIONS   Rest your Achilles tendon and avoid activities that cause pain.  Apply ice to the injured area:  Put ice in a plastic bag.  Place a towel between your skin and the bag.  Leave the ice on for 20 minutes, 2-3 times a day  Try to avoid using the tendon (other than gentle range of motion) while the tendon is painful. Do not resume use until instructed by your health care provider. Then begin use gradually. Do not increase use to the point of pain. If pain does develop, decrease use and continue the above measures. Gradually increase activities that do not cause discomfort until you achieve normal use.  Do exercises to make your calf muscles stronger and more flexible. Your health care provider or physical therapist can recommend exercises for you to do.  Wrap your ankle with an elastic bandage or other wrap. This can help keep your tendon from moving too much. Your health care provider will show you how to wrap your ankle correctly.  Only take over-the-counter or prescription medicines for pain, discomfort, or fever as directed by your health care provider. SEEK MEDICAL CARE IF:   Your pain and swelling  increase or pain is uncontrolled with medicines.  You develop new, unexplained symptoms or your symptoms get worse.  You are unable to move your toes or foot.  You develop warmth and swelling in your foot.  You have an unexplained temperature. MAKE SURE YOU:   Understand these instructions.  Will watch your condition.  Will get help right away if you are not doing well or get  worse. Document Released: 07/15/2005 Document Revised: 07/26/2013 Document Reviewed: 05/17/2013 [facility] Patient Information 2015 [place], [place]. This information is not intended to replace advice given to you by your health care provider. Make sure you discuss any questions you have with your health care provider.

## 2015-07-11 NOTE — Telephone Encounter (Signed)
Pt called to inform he is going to have to have another sleep study done. Thanks.

## 2015-07-11 NOTE — Assessment & Plan Note (Signed)
Last sleep study was 2008; I think getting back on regular CPAP use will also help with blood pressure control; will get CPAP auto-titration per recommendation of Lyndal Rainbow, RT; note to staff to set this up

## 2015-07-11 NOTE — Assessment & Plan Note (Signed)
Better control, but not to goal; he will see nephrologist soon to help [place] evaluate and treat; patient will ideally lose significant weight in the coming months and we would both love to be able to reduce BP meds rather than add more over the coming months or years; DASH guidelines; avoidance of NSAIDs; avoidance of decongestants

## 2015-07-12 ENCOUNTER — Ambulatory Visit: Payer: BLUE CROSS/BLUE SHIELD | Admitting: Urology

## 2015-07-15 NOTE — Telephone Encounter (Signed)
done

## 2015-07-22 ENCOUNTER — Ambulatory Visit: Payer: Self-pay | Admitting: Orthopedic Surgery

## 2015-07-22 NOTE — H&P (Signed)
Marc Stevens is an 54 y.o. male.   Chief Complaint: R shoulder pain HPI: The patient is a 54 year old male who presents today for follow up of their back. The patient is being followed for their back pain. They are now 18 weeks and 1 day out from lumbar decompession @ L4-5. The patient states that they are doing 99.9 percent better. Current treatment includes: home exercise program. The following medication has been used for pain control: none. The patient reports their current pain level to be mild. The patient has reported improvement of their symptoms with: physical therapy. Note for "Follow-up back": The patient is out of work.  Marc Stevens is 18 weeks status post. His back is doing much better. He would like to return to work on the 22nd at his regular duties. At some point in the fall, he would like to proceed with repairing his rotator cuff. It is persistently painful for him. He has had a history of a rotator cuff actually on his left shoulder, this is his right shoulder  Past Medical History  Diagnosis Date  . History of tobacco abuse     quit 2008  . Chest pain 04/18/2012  . OSA (obstructive sleep apnea)     cpap - does not know settings   . ED (erectile dysfunction)   . Hyperlipidemia   . HTN (hypertension) 04/18/2012  . Low testosterone   . BPH (benign prostatic hypertrophy)     Past Surgical History  Procedure Laterality Date  . Rotator cuff repair  2007    Left  . Doppler echocardiography  2013  . Left kene arthroscopy     . Lumbar laminectomy/decompression microdiscectomy N/A 01/30/2015    Procedure: MICRO LUMBAR DECOMPRESSION, BILATERAL FORAMINOTOMIES, MICRODISCECTOMY LUMBAR FOUR TO LUMBAR FIVE;  Surgeon: Susa Day, MD;  Location: [facility];  Service: Orthopedics;  Laterality: N/A;    Family History  Problem Relation Age of Onset  . Dementia Mother   . Hypertension Mother   . Kidney disease Neg Hx   . Prostate cancer Neg Hx    Social History:  reports that  he quit smoking about 9 years ago. He has never used smokeless tobacco. He reports that he drinks alcohol. He reports that he does not use illicit drugs.  Allergies: No Known Allergies   (Not in a hospital admission)  No results found for this or any previous visit (from the past 48 hour(s)). No results found.  Review of Systems  Constitutional: Negative.   HENT: Negative.   Eyes: Negative.   Respiratory: Negative.   Cardiovascular: Negative.   Gastrointestinal: Negative.   Genitourinary: Negative.   Musculoskeletal: Positive for joint pain.  Skin: Negative.   Neurological: Negative.   Psychiatric/Behavioral: Negative.     There were no vitals taken for this visit. Physical Exam  Constitutional: He appears well-developed and well-nourished.  HENT:  Head: Normocephalic and atraumatic.  Eyes: Conjunctivae are normal.  Neck: Normal range of motion.  Cardiovascular: Normal rate.   Respiratory: Effort normal.  GI: Soft.  Musculoskeletal:  On exam, he is weak in external rotation. He has a positive impingement sign in deep. Tender in the anterior subacromial region.  Neurological: He is alert.  Skin: Skin is warm.    His MRI on February demonstrates full thickness tear of the infraspinatus and supraspinatus with retraction and high-grade tear of the long head of the biceps.  Assessment/Plan Rotator cuff tear, right shoulder, rotator cuff arthropathy.  He will return  him on 22nd full duties with strategies to avoid injury to the shoulder. He indicates can come out for a while to do the shoulder. We did discuss rotator cuff repair.  I had a long discussion with the patient concerning the risks and benefits of a right rotator cuff repair, including bleeding, infection, prolonged postoperative recovery, which may require 3 to 5 months until maximum medical improvement. Overnight procedure with initiation of early passive range of motion within physical therapy. Avoid any active  motion for the first six weeks. This is all in an effort to avoid recurrent tear of the rotator cuff and adhesive capsulitis. Return to work without use of the arm can be obtained following two weeks. However, driving will be a challenge. We also discussed the possibility of requiring implants including bone anchors, as well as an Allograft patch graft if a massive rotator cuff tear is encountered. Removal of any bones for spurs as well as bursitis will be performed during the procedure and also any associated anesthetic complications as well.  Mini open possible patch graft specifically indicated due to the duration. He very well may have an inoperable rotator cuff, however, debridement maybe appropriate, possible biceps tenotomy versus tenodesis. Overnight in the hospital. He has to return to work within 26 weeks to feel that is a reasonable requirement.  Plan Right shoulder mini-open RCR, SAD, possible patch graft  Marc Stevens M. PA-C for Dr. Tonita Cong 07/22/2015, 3:00 PM

## 2015-07-30 ENCOUNTER — Other Ambulatory Visit: Payer: Self-pay

## 2015-07-30 MED ORDER — HYDRALAZINE HCL 25 MG PO TABS: 50.0000 mg | ORAL_TABLET | Freq: Three times a day (TID) | ORAL | Status: DC

## 2015-07-30 NOTE — Patient Instructions (Addendum)
YOUR PROCEDURE IS SCHEDULED ON :  08/08/15  REPORT TO [facility] MAIN ENTRANCE FOLLOW SIGNS TO EAST ELEVATOR - GO TO 3rd FLOOR CHECK IN AT 3 EAST NURSES STATION (SHORT STAY) AT:  5:30 AM  CALL THIS NUMBER IF YOU HAVE PROBLEMS THE MORNING OF SURGERY [phone]  REMEMBER:ONLY 1 PER PERSON MAY GO TO SHORT STAY WITH YOU TO GET READY THE MORNING OF YOUR SURGERY  DO NOT EAT FOOD OR DRINK LIQUIDS AFTER MIDNIGHT  TAKE THESE MEDICINES THE MORNING OF SURGERY: AMLODIPINE  BRING C-PAP TUBING AND MASK TO HOSPITAL  YOU MAY NOT HAVE ANY METAL ON YOUR BODY INCLUDING HAIR PINS AND PIERCING'S. DO NOT WEAR JEWELRY, MAKEUP, LOTIONS, POWDERS OR PERFUMES. DO NOT WEAR NAIL POLISH. DO NOT SHAVE 48 HRS PRIOR TO SURGERY. MEN MAY SHAVE FACE AND NECK.  DO NOT [place]. [facility] IS NOT RESPONSIBLE FOR VALUABLES.  CONTACTS, DENTURES OR PARTIALS MAY NOT BE WORN TO SURGERY. LEAVE SUITCASE IN CAR. CAN BE BROUGHT TO ROOM AFTER SURGERY.  PATIENTS DISCHARGED THE DAY OF SURGERY WILL NOT BE ALLOWED TO DRIVE HOME.  PLEASE READ OVER THE FOLLOWING INSTRUCTION SHEETS _________________________________________________________________________________                                          [facility] - PREPARING FOR SURGERY  Before surgery, you can play an important role.  Because skin is not sterile, your skin needs to be as free of germs as possible.  You can reduce the number of germs on your skin by washing with CHG (chlorahexidine gluconate) soap before surgery.  CHG is an antiseptic cleaner which kills germs and bonds with the skin to continue killing germs even after washing. Please DO NOT use if you have an allergy to CHG or antibacterial soaps.  If your skin becomes reddened/irritated stop using the CHG and inform your nurse when you arrive at Short Stay. Do not shave (including legs and underarms) for at least 48 hours prior to the first CHG shower.  You may shave your  face. Please follow these instructions carefully:   1.  Shower with CHG Soap the night before surgery and the  morning of Surgery.   2.  If you choose to wash your hair, wash your hair first as usual with your  normal  Shampoo.   3.  After you shampoo, rinse your hair and body thoroughly to remove the  shampoo.                                         4.  Use CHG as you would any other liquid soap.  You can apply chg directly  to the skin and wash . Gently wash with scrungie or clean wascloth    5.  Apply the CHG Soap to your body ONLY FROM THE NECK DOWN.   Do not use on open                           Wound or open sores. Avoid contact with eyes, ears mouth and genitals (private parts).                        Genitals (  private parts) with your normal soap.              6.  Wash thoroughly, paying special attention to the area where your surgery  will be performed.   7.  Thoroughly rinse your body with warm water from the neck down.   8.  DO NOT shower/wash with your normal soap after using and rinsing off  the CHG Soap .                9.  Pat yourself dry with a clean towel.             10.  Wear clean night clothes to bed after shower             11.  Place clean sheets on your bed the night of your first shower and do not  sleep with pets.  Day of Surgery : Do not apply any lotions/deodorants the morning of surgery.  Please wear clean clothes to the hospital/surgery center.  FAILURE TO FOLLOW THESE INSTRUCTIONS MAY RESULT IN THE CANCELLATION OF YOUR SURGERY    PATIENT SIGNATURE_________________________________  ______________________________________________________________________     Marc Stevens  An incentive spirometer is a tool that can help keep your lungs clear and active. This tool measures how well you are filling your lungs with each breath. Taking long deep breaths may help reverse or decrease the chance of developing breathing (pulmonary) problems  (especially infection) following:  A long period of time when you are unable to move or be active. BEFORE THE PROCEDURE   If the spirometer includes an indicator to show your best effort, your nurse or respiratory therapist will set it to a desired goal.  If possible, sit up straight or lean slightly forward. Try not to slouch.  Hold the incentive spirometer in an upright position. INSTRUCTIONS FOR USE  1. Sit on the edge of your bed if possible, or sit up as far as you can in bed or on a chair. 2. Hold the incentive spirometer in an upright position. 3. Breathe out normally. 4. Place the mouthpiece in your mouth and seal your lips tightly around it. 5. Breathe in slowly and as deeply as possible, raising the piston or the ball toward the top of the column. 6. Hold your breath for 3-5 seconds or for as long as possible. Allow the piston or ball to fall to the bottom of the column. 7. Remove the mouthpiece from your mouth and breathe out normally. 8. Rest for a few seconds and repeat Steps 1 through 7 at least 10 times every 1-2 hours when you are awake. Take your time and take a few normal breaths between deep breaths. 9. The spirometer may include an indicator to show your best effort. Use the indicator as a goal to work toward during each repetition. 10. After each set of 10 deep breaths, practice coughing to be sure your lungs are clear. If you have an incision (the cut made at the time of surgery), support your incision when coughing by placing a pillow or rolled up towels firmly against it. Once you are able to get out of bed, walk around indoors and cough well. You may stop using the incentive spirometer when instructed by your caregiver.  RISKS AND COMPLICATIONS  Take your time so you do not get dizzy or light-headed.  If you are in pain, you may need to take or ask for pain medication before doing incentive spirometry. It is harder  to take a deep breath if you are having  pain. AFTER USE  Rest and breathe slowly and easily.  It can be helpful to keep track of a log of your progress. Your caregiver can provide you with a simple table to help with this. If you are using the spirometer at home, follow these instructions: [place] IF:   You are having difficultly using the spirometer.  You have trouble using the spirometer as often as instructed.  Your pain medication is not giving enough relief while using the spirometer.  You develop fever of 100.5 F (38.1 C) or higher. SEEK IMMEDIATE MEDICAL CARE IF:   You cough up bloody sputum that had not been present before.  You develop fever of 102 F (38.9 C) or greater.  You develop worsening pain at or near the incision site. MAKE SURE YOU:   Understand these instructions.  Will watch your condition.  Will get help right away if you are not doing well or get worse. Document Released: 02/15/2007 Document Revised: 12/28/2011 Document Reviewed: 04/18/2007 [facility] Patient Information 2014 [place], [place].   ________________________________________________________________________

## 2015-07-30 NOTE — Telephone Encounter (Signed)
Patient has refills on Hydralazine, but would like to have it changed to get a 90 day supply.

## 2015-08-01 ENCOUNTER — Encounter (HOSPITAL_COMMUNITY): Payer: Self-pay

## 2015-08-01 ENCOUNTER — Encounter (HOSPITAL_COMMUNITY)
Admission: RE | Admit: 2015-08-01 | Discharge: 2015-08-01 | Disposition: A | Discharge status: Home / Self Care | Payer: BLUE CROSS/BLUE SHIELD | Source: Ambulatory Visit | Attending: Specialist | Admitting: Specialist

## 2015-08-01 DIAGNOSIS — Z01818 Encounter for other preprocedural examination: Secondary | ICD-10-CM | POA: Insufficient documentation

## 2015-08-01 DIAGNOSIS — M75120 Complete rotator cuff tear or rupture of unspecified shoulder, not specified as traumatic: Secondary | ICD-10-CM | POA: Insufficient documentation

## 2015-08-01 HISTORY — DX: Unspecified rotator cuff tear or rupture of unspecified shoulder, not specified as traumatic: M75.100

## 2015-08-01 LAB — CBC
HEMATOCRIT: 46.9 % (ref 39.0–52.0)
HEMOGLOBIN: 14.8 g/dL (ref 13.0–17.0)
MCH: 20.3 pg — ABNORMAL LOW (ref 26.0–34.0)
MCHC: 31.6 g/dL (ref 30.0–36.0)
MCV: 64.2 fL — ABNORMAL LOW (ref 78.0–100.0)
Platelets: 280 10*3/uL (ref 150–400)
RBC: 7.3 MIL/uL — AB (ref 4.22–5.81)
RDW: 15.4 % (ref 11.5–15.5)
WBC: 5.3 10*3/uL (ref 4.0–10.5)

## 2015-08-01 LAB — BASIC METABOLIC PANEL
Anion gap: 8 (ref 5–15)
BUN: 23 mg/dL — ABNORMAL HIGH (ref 6–20)
CHLORIDE: 103 mmol/L (ref 101–111)
CO2: 28 mmol/L (ref 22–32)
CREATININE: 1.33 mg/dL — AB (ref 0.61–1.24)
Calcium: 9.3 mg/dL (ref 8.9–10.3)
GFR calc non Af Amer: 60 mL/min — ABNORMAL LOW (ref >60)
Glucose, Bld: 97 mg/dL (ref 65–99)
POTASSIUM: 3.7 mmol/L (ref 3.5–5.1)
SODIUM: 139 mmol/L (ref 135–145)

## 2015-08-05 ENCOUNTER — Telehealth: Payer: Self-pay | Admitting: Family Medicine

## 2015-08-05 NOTE — Telephone Encounter (Signed)
Pt would like to discuss meds and if he needs to get another sleep study

## 2015-08-05 NOTE — Telephone Encounter (Signed)
I thought we took care of the sleep study with Anderson Malta on 07/11/15; please see note; we had been working on that when she suggested we change the order; I approved that so make sure that happens ASAP please

## 2015-08-05 NOTE — Telephone Encounter (Signed)
Patient wanted to let us know that his cardiologist changed him from Hydralazine to HCTZ. I updated his med list.  He also says he talked to [facility] and they did not have an order for his sleep study. I advised him I'd resend the order.

## 2015-08-06 NOTE — Telephone Encounter (Signed)
I remember filling it out and sending it but I can't find it in epic so I'm just going to resend it. I placed a new order in your box to sign.

## 2015-08-08 ENCOUNTER — Encounter (HOSPITAL_COMMUNITY): Payer: Self-pay | Admitting: *Deleted

## 2015-08-08 ENCOUNTER — Ambulatory Visit (HOSPITAL_COMMUNITY)
Admission: RE | Admit: 2015-08-08 | Discharge: 2015-08-09 | Disposition: A | Discharge status: Home / Self Care | Payer: BLUE CROSS/BLUE SHIELD | Source: Ambulatory Visit | Attending: Specialist | Admitting: Specialist

## 2015-08-08 ENCOUNTER — Ambulatory Visit (HOSPITAL_COMMUNITY): Payer: BLUE CROSS/BLUE SHIELD | Admitting: Certified Registered Nurse Anesthetist

## 2015-08-08 ENCOUNTER — Encounter (HOSPITAL_COMMUNITY)
Admission: RE | Disposition: A | Discharge status: Home / Self Care | Payer: Self-pay | Source: Ambulatory Visit | Attending: Specialist

## 2015-08-08 DIAGNOSIS — M75120 Complete rotator cuff tear or rupture of unspecified shoulder, not specified as traumatic: Secondary | ICD-10-CM

## 2015-08-08 DIAGNOSIS — M7541 Impingement syndrome of right shoulder: Secondary | ICD-10-CM | POA: Diagnosis present

## 2015-08-08 DIAGNOSIS — I10 Essential (primary) hypertension: Secondary | ICD-10-CM | POA: Insufficient documentation

## 2015-08-08 DIAGNOSIS — N400 Enlarged prostate without lower urinary tract symptoms: Secondary | ICD-10-CM | POA: Insufficient documentation

## 2015-08-08 DIAGNOSIS — Z87891 Personal history of nicotine dependence: Secondary | ICD-10-CM | POA: Insufficient documentation

## 2015-08-08 DIAGNOSIS — M75121 Complete rotator cuff tear or rupture of right shoulder, not specified as traumatic: Secondary | ICD-10-CM | POA: Diagnosis not present

## 2015-08-08 DIAGNOSIS — E785 Hyperlipidemia, unspecified: Secondary | ICD-10-CM | POA: Insufficient documentation

## 2015-08-08 DIAGNOSIS — Z7982 Long term (current) use of aspirin: Secondary | ICD-10-CM | POA: Insufficient documentation

## 2015-08-08 DIAGNOSIS — G4733 Obstructive sleep apnea (adult) (pediatric): Secondary | ICD-10-CM | POA: Insufficient documentation

## 2015-08-08 DIAGNOSIS — Z6838 Body mass index (BMI) 38.0-38.9, adult: Secondary | ICD-10-CM | POA: Insufficient documentation

## 2015-08-08 HISTORY — PX: SHOULDER OPEN ROTATOR CUFF REPAIR: SHX2407

## 2015-08-08 HISTORY — DX: Complete rotator cuff tear or rupture of unspecified shoulder, not specified as traumatic: M75.120

## 2015-08-08 SURGERY — REPAIR, ROTATOR CUFF, OPEN
Anesthesia: Regional | Site: Shoulder | Laterality: Right

## 2015-08-08 MED ORDER — RISAQUAD PO CAPS
1.0000 | ORAL_CAPSULE | Freq: Every day | ORAL | Status: DC
  Administered 2015-08-08 – 2015-08-09 (×2): 1 via ORAL
  Filled 2015-08-08 (×2): qty 1

## 2015-08-08 MED ORDER — OXYCODONE HCL 5 MG PO TABS: 5.0000 mg | ORAL_TABLET | Freq: Once | ORAL | Status: DC | PRN

## 2015-08-08 MED ORDER — SODIUM CHLORIDE 0.9 % IJ SOLN
INTRAMUSCULAR | Status: AC
  Filled 2015-08-08: qty 10

## 2015-08-08 MED ORDER — ONDANSETRON HCL 4 MG/2ML IJ SOLN: 4.0000 mg | Freq: Four times a day (QID) | INTRAMUSCULAR | Status: DC | PRN

## 2015-08-08 MED ORDER — PHENOL 1.4 % MT LIQD: 1.0000 | OROMUCOSAL | Status: DC | PRN

## 2015-08-08 MED ORDER — ENOXAPARIN SODIUM 40 MG/0.4ML ~~LOC~~ SOLN
40.0000 mg | SUBCUTANEOUS | Status: DC
  Administered 2015-08-09: 40 mg via SUBCUTANEOUS
  Filled 2015-08-08 (×2): qty 0.4

## 2015-08-08 MED ORDER — CEFAZOLIN SODIUM-DEXTROSE 2-3 GM-% IV SOLR
2.0000 g | Freq: Four times a day (QID) | INTRAVENOUS | Status: AC
  Administered 2015-08-08 (×2): 2 g via INTRAVENOUS
  Filled 2015-08-08 (×2): qty 50

## 2015-08-08 MED ORDER — BUPIVACAINE-EPINEPHRINE 0.5% -1:200000 IJ SOLN
INTRAMUSCULAR | Status: DC | PRN
  Administered 2015-08-08: 15 mL

## 2015-08-08 MED ORDER — DOCUSATE SODIUM 100 MG PO CAPS: 100.0000 mg | ORAL_CAPSULE | Freq: Two times a day (BID) | ORAL | Status: DC | PRN

## 2015-08-08 MED ORDER — LIDOCAINE HCL (CARDIAC) 20 MG/ML IV SOLN
INTRAVENOUS | Status: AC
  Filled 2015-08-08: qty 5

## 2015-08-08 MED ORDER — SUCCINYLCHOLINE CHLORIDE 20 MG/ML IJ SOLN
INTRAMUSCULAR | Status: DC | PRN
  Administered 2015-08-08: 100 mg via INTRAVENOUS

## 2015-08-08 MED ORDER — OXYCODONE HCL 5 MG/5ML PO SOLN
5.0000 mg | Freq: Once | ORAL | Status: DC | PRN
  Filled 2015-08-08: qty 5

## 2015-08-08 MED ORDER — BUPIVACAINE-EPINEPHRINE (PF) 0.5% -1:200000 IJ SOLN
INTRAMUSCULAR | Status: DC | PRN
  Administered 2015-08-08: 30 mL via PERINEURAL

## 2015-08-08 MED ORDER — METHOCARBAMOL 500 MG PO TABS
500.0000 mg | ORAL_TABLET | Freq: Four times a day (QID) | ORAL | Status: DC | PRN
  Administered 2015-08-08 – 2015-08-09 (×2): 500 mg via ORAL
  Filled 2015-08-08 (×2): qty 1

## 2015-08-08 MED ORDER — OXYCODONE-ACETAMINOPHEN 10-325 MG PO TABS: 1.0000 | ORAL_TABLET | ORAL | Status: DC | PRN

## 2015-08-08 MED ORDER — ROCURONIUM BROMIDE 100 MG/10ML IV SOLN
INTRAVENOUS | Status: AC
  Filled 2015-08-08: qty 1

## 2015-08-08 MED ORDER — LIDOCAINE HCL (CARDIAC) 20 MG/ML IV SOLN
INTRAVENOUS | Status: DC | PRN
  Administered 2015-08-08: 50 mg via INTRAVENOUS

## 2015-08-08 MED ORDER — NEOSTIGMINE METHYLSULFATE 10 MG/10ML IV SOLN
INTRAVENOUS | Status: AC
  Filled 2015-08-08: qty 1

## 2015-08-08 MED ORDER — BUPIVACAINE-EPINEPHRINE (PF) 0.5% -1:200000 IJ SOLN
INTRAMUSCULAR | Status: AC
  Filled 2015-08-08: qty 30

## 2015-08-08 MED ORDER — FENTANYL CITRATE (PF) 250 MCG/5ML IJ SOLN
INTRAMUSCULAR | Status: AC
  Filled 2015-08-08: qty 25

## 2015-08-08 MED ORDER — ONDANSETRON HCL 4 MG/2ML IJ SOLN
INTRAMUSCULAR | Status: AC
  Filled 2015-08-08: qty 2

## 2015-08-08 MED ORDER — GLYCOPYRROLATE 0.2 MG/ML IJ SOLN
INTRAMUSCULAR | Status: DC | PRN
  Administered 2015-08-08: .8 mg via INTRAVENOUS

## 2015-08-08 MED ORDER — KCL IN DEXTROSE-NACL 20-5-0.45 MEQ/L-%-% IV SOLN
INTRAVENOUS | Status: AC
  Administered 2015-08-08: 13:00:00 via INTRAVENOUS
  Filled 2015-08-08 (×2): qty 1000

## 2015-08-08 MED ORDER — METHOCARBAMOL 1000 MG/10ML IJ SOLN
500.0000 mg | Freq: Four times a day (QID) | INTRAVENOUS | Status: DC | PRN
  Filled 2015-08-08: qty 5

## 2015-08-08 MED ORDER — LOSARTAN POTASSIUM 50 MG PO TABS
100.0000 mg | ORAL_TABLET | Freq: Every evening | ORAL | Status: DC
  Administered 2015-08-08: 100 mg via ORAL
  Filled 2015-08-08 (×2): qty 2

## 2015-08-08 MED ORDER — METOCLOPRAMIDE HCL 5 MG/ML IJ SOLN: 5.0000 mg | Freq: Three times a day (TID) | INTRAMUSCULAR | Status: DC | PRN

## 2015-08-08 MED ORDER — HYDROMORPHONE HCL 1 MG/ML IJ SOLN
1.0000 mg | INTRAMUSCULAR | Status: DC | PRN
  Administered 2015-08-09 (×2): 1 mg via INTRAVENOUS
  Filled 2015-08-08 (×2): qty 1

## 2015-08-08 MED ORDER — CEFAZOLIN SODIUM 10 G IJ SOLR
3.0000 g | INTRAMUSCULAR | Status: AC
  Administered 2015-08-08: 3 g via INTRAVENOUS
  Filled 2015-08-08: qty 3000

## 2015-08-08 MED ORDER — AMLODIPINE BESYLATE 10 MG PO TABS
10.0000 mg | ORAL_TABLET | Freq: Every morning | ORAL | Status: DC
  Administered 2015-08-09: 10 mg via ORAL
  Filled 2015-08-08: qty 1

## 2015-08-08 MED ORDER — MIDAZOLAM HCL 2 MG/2ML IJ SOLN
INTRAMUSCULAR | Status: AC
  Filled 2015-08-08: qty 4

## 2015-08-08 MED ORDER — NEOSTIGMINE METHYLSULFATE 10 MG/10ML IV SOLN
INTRAVENOUS | Status: DC | PRN
  Administered 2015-08-08: 5 mg via INTRAVENOUS

## 2015-08-08 MED ORDER — LACTATED RINGERS IV SOLN
INTRAVENOUS | Status: DC
  Administered 2015-08-08: 1000 mL via INTRAVENOUS

## 2015-08-08 MED ORDER — MIDAZOLAM HCL 5 MG/5ML IJ SOLN
INTRAMUSCULAR | Status: DC | PRN
  Administered 2015-08-08: 2 mg via INTRAVENOUS

## 2015-08-08 MED ORDER — LACTATED RINGERS IV SOLN: INTRAVENOUS | Status: DC

## 2015-08-08 MED ORDER — EPHEDRINE SULFATE 50 MG/ML IJ SOLN
INTRAMUSCULAR | Status: AC
  Filled 2015-08-08: qty 1

## 2015-08-08 MED ORDER — METOCLOPRAMIDE HCL 10 MG PO TABS: 5.0000 mg | ORAL_TABLET | Freq: Three times a day (TID) | ORAL | Status: DC | PRN

## 2015-08-08 MED ORDER — PROPOFOL 10 MG/ML IV BOLUS
INTRAVENOUS | Status: AC
  Filled 2015-08-08: qty 20

## 2015-08-08 MED ORDER — SODIUM CHLORIDE 0.9 % IR SOLN
Status: DC | PRN
  Administered 2015-08-08: 500 mL

## 2015-08-08 MED ORDER — ROCURONIUM BROMIDE 100 MG/10ML IV SOLN
INTRAVENOUS | Status: DC | PRN
  Administered 2015-08-08 (×3): 20 mg via INTRAVENOUS

## 2015-08-08 MED ORDER — SENNOSIDES-DOCUSATE SODIUM 8.6-50 MG PO TABS: 1.0000 | ORAL_TABLET | Freq: Every evening | ORAL | Status: DC | PRN

## 2015-08-08 MED ORDER — SODIUM CHLORIDE 0.9 % IR SOLN
Status: AC
  Filled 2015-08-08: qty 1

## 2015-08-08 MED ORDER — PROPOFOL 10 MG/ML IV BOLUS
INTRAVENOUS | Status: DC | PRN
  Administered 2015-08-08: 200 mg via INTRAVENOUS

## 2015-08-08 MED ORDER — METHOCARBAMOL 500 MG PO TABS: 500.0000 mg | ORAL_TABLET | Freq: Three times a day (TID) | ORAL | Status: DC | PRN

## 2015-08-08 MED ORDER — ACETAMINOPHEN 325 MG PO TABS
650.0000 mg | ORAL_TABLET | Freq: Four times a day (QID) | ORAL | Status: DC | PRN
  Administered 2015-08-09: 650 mg via ORAL
  Filled 2015-08-08: qty 2

## 2015-08-08 MED ORDER — ONDANSETRON HCL 4 MG/2ML IJ SOLN
INTRAMUSCULAR | Status: DC | PRN
  Administered 2015-08-08: 4 mg via INTRAVENOUS

## 2015-08-08 MED ORDER — BISACODYL 5 MG PO TBEC: 5.0000 mg | DELAYED_RELEASE_TABLET | Freq: Every day | ORAL | Status: DC | PRN

## 2015-08-08 MED ORDER — MAGNESIUM CITRATE PO SOLN: 1.0000 | Freq: Once | ORAL | Status: DC | PRN

## 2015-08-08 MED ORDER — GLYCOPYRROLATE 0.2 MG/ML IJ SOLN
INTRAMUSCULAR | Status: AC
  Filled 2015-08-08: qty 3

## 2015-08-08 MED ORDER — OXYCODONE HCL 5 MG PO TABS
5.0000 mg | ORAL_TABLET | ORAL | Status: DC | PRN
  Administered 2015-08-08 – 2015-08-09 (×4): 10 mg via ORAL
  Filled 2015-08-08 (×4): qty 2

## 2015-08-08 MED ORDER — MENTHOL 3 MG MT LOZG: 1.0000 | LOZENGE | OROMUCOSAL | Status: DC | PRN

## 2015-08-08 MED ORDER — ONDANSETRON HCL 4 MG PO TABS: 4.0000 mg | ORAL_TABLET | Freq: Four times a day (QID) | ORAL | Status: DC | PRN

## 2015-08-08 MED ORDER — FENTANYL CITRATE (PF) 100 MCG/2ML IJ SOLN
INTRAMUSCULAR | Status: DC | PRN
  Administered 2015-08-08 (×2): 100 ug via INTRAVENOUS

## 2015-08-08 MED ORDER — EPHEDRINE SULFATE 50 MG/ML IJ SOLN
INTRAMUSCULAR | Status: DC | PRN
  Administered 2015-08-08 (×4): 10 mg via INTRAVENOUS

## 2015-08-08 MED ORDER — ACETAMINOPHEN 650 MG RE SUPP: 650.0000 mg | Freq: Four times a day (QID) | RECTAL | Status: DC | PRN

## 2015-08-08 MED ORDER — HYDROMORPHONE HCL 1 MG/ML IJ SOLN: 0.2500 mg | INTRAMUSCULAR | Status: DC | PRN

## 2015-08-08 MED ORDER — DOCUSATE SODIUM 100 MG PO CAPS
100.0000 mg | ORAL_CAPSULE | Freq: Two times a day (BID) | ORAL | Status: DC
Start: 2015-08-08 — End: 2015-08-09
  Administered 2015-08-08 – 2015-08-09 (×2): 100 mg via ORAL

## 2015-08-08 SURGICAL SUPPLY — 49 items
ANCH SUT SWLK 19.1X4.75 VT (Anchor) ×2 IMPLANT
ANCHOR NEEDLE 9/16 CIR SZ 8 (NEEDLE) IMPLANT
ANCHOR PEEK 4.75X19.1 SWLK C (Anchor) ×4 IMPLANT
BAG SPEC THK2 15X12 ZIP CLS (MISCELLANEOUS)
BAG ZIPLOCK 12X15 (MISCELLANEOUS) IMPLANT
BLADE OSCILLATING/SAGITTAL (BLADE)
BLADE SW THK.38XMED LNG THN (BLADE) IMPLANT
BNDG COHESIVE 6X5 TAN STRL LF (GAUZE/BANDAGES/DRESSINGS) ×2 IMPLANT
CLOTH 2% CHLOROHEXIDINE 3PK (PERSONAL CARE ITEMS) ×2 IMPLANT
DRAPE POUCH INSTRU U-SHP 10X18 (DRAPES) ×2 IMPLANT
DRSG AQUACEL AG ADV 3.5X 4 (GAUZE/BANDAGES/DRESSINGS) ×2 IMPLANT
DRSG AQUACEL AG ADV 3.5X 6 (GAUZE/BANDAGES/DRESSINGS) IMPLANT
DURAPREP 26ML APPLICATOR (WOUND CARE) ×2 IMPLANT
ELECT NEEDLE TIP 2.8 STRL (NEEDLE) ×4 IMPLANT
ELECT REM PT RETURN 9FT ADLT (ELECTROSURGICAL) ×2
ELECTRODE REM PT RTRN 9FT ADLT (ELECTROSURGICAL) ×1 IMPLANT
GLOVE BIOGEL PI IND STRL 7.0 (GLOVE) ×1 IMPLANT
GLOVE BIOGEL PI IND STRL 8 (GLOVE) ×1 IMPLANT
GLOVE BIOGEL PI INDICATOR 7.0 (GLOVE) ×1
GLOVE BIOGEL PI INDICATOR 8 (GLOVE) ×1
GLOVE SURG SS PI 7.0 STRL IVOR (GLOVE) ×2 IMPLANT
GLOVE SURG SS PI 7.5 STRL IVOR (GLOVE) ×2 IMPLANT
GLOVE SURG SS PI 8.0 STRL IVOR (GLOVE) ×2 IMPLANT
GOWN STRL REUS W/TWL XL LVL3 (GOWN DISPOSABLE) ×4 IMPLANT
KIT BASIN OR (CUSTOM PROCEDURE TRAY) ×2 IMPLANT
KIT POSITION SHOULDER SCHLEI (MISCELLANEOUS) ×2 IMPLANT
MANIFOLD NEPTUNE II (INSTRUMENTS) ×2 IMPLANT
NEEDLE SCORPION MULTI FIRE (NEEDLE) ×2 IMPLANT
PACK SHOULDER (CUSTOM PROCEDURE TRAY) ×2 IMPLANT
PEN SKIN MARKING BROAD (MISCELLANEOUS) ×2 IMPLANT
POSITIONER SURGICAL ARM (MISCELLANEOUS) ×2 IMPLANT
SLING ARM FOAM STRAP LRG (SOFTGOODS) ×2 IMPLANT
SLING ARM IMMOBILIZER LRG (SOFTGOODS) IMPLANT
SLING ULTRA II L (ORTHOPEDIC SUPPLIES) IMPLANT
STRIP CLOSURE SKIN 1/2X4 (GAUZE/BANDAGES/DRESSINGS) ×2 IMPLANT
SUCTION FRAZIER 12FR DISP (SUCTIONS) ×2 IMPLANT
SUT BONE WAX W31G (SUTURE) IMPLANT
SUT ETHIBOND NAB CT1 #1 30IN (SUTURE) IMPLANT
SUT FIBERWIRE #2 38 T-5 BLUE (SUTURE)
SUT PROLENE 3 0 PS 2 (SUTURE) ×2 IMPLANT
SUT TIGER TAPE 7 IN WHITE (SUTURE) IMPLANT
SUT VIC AB 1-0 CT2 27 (SUTURE) ×2 IMPLANT
SUT VIC AB 2-0 CT2 27 (SUTURE) ×2 IMPLANT
SUT VICRYL 0-0 OS 2 NEEDLE (SUTURE) IMPLANT
SUTURE FIBERWR #2 38 T-5 BLUE (SUTURE) IMPLANT
TAPE FIBER 2MM 7IN #2 BLUE (SUTURE) ×2 IMPLANT
TOWEL OR 17X26 10 PK STRL BLUE (TOWEL DISPOSABLE) ×2 IMPLANT
TOWEL OR NON WOVEN STRL DISP B (DISPOSABLE) ×2 IMPLANT
YANKAUER SUCT BULB TIP NO VENT (SUCTIONS) ×2 IMPLANT

## 2015-08-08 NOTE — Op Note (Signed)
Marc Stevens, Marc Stevens          ACCOUNT NO.:  [id number]  MEDICAL RECORD NO.:  [id number]  LOCATION:  [facility]                         FACILITY:  [facility]  PHYSICIAN:  Marc Stevens, M.D.    DATE OF BIRTH:  15-Feb-1961  DATE OF PROCEDURE:  08/08/2015 DATE OF DISCHARGE:                              OPERATIVE REPORT   PREOPERATIVE DIAGNOSIS:  Massive tear, rotator cuff, right shoulder impingement syndrome.  POSTOPERATIVE DIAGNOSIS:  Massive tear, rotator cuff, right shoulder impingement syndrome.  PROCEDURES PERFORMED: 1. Mini open rotator cuff repair. 2. Subacromial decompression, acromioplasty, and bursectomy.  ANESTHESIA:  General with a regional block.  SURGEON:  Marc Stevens, M.D.  ASSISTANT:  Marc Dunker, PA.  HISTORY:  A 54 year old with rotator cuff tear, retracted, pain, refractory to conservative treatment, indicated for repair of the rotator cuff.  Risks and benefits were discussed.  Bleeding, infection, damage to neurovascular structures, inability to repair, DVT, PE, anesthetic complications, etc.  We discussed the possibility of a patch graft as well due to the nature of the tear.  TECHNIQUE:  The patient in supine beach-chair position, after induction of adequate general anesthesia, 2 g Kefzol, the right shoulder and upper extremity was prepped and draped in usual sterile fashion.  Surgical marker utilized along the acromion and AC joint coracoid.  A standard posterolateral incision over the anterolateral aspect of the acromion was made through skin only with 2 cm in length in line with the skin incision.  A 0.25% Marcaine with epinephrine was infiltrated in the deltoid there.  The raphe between the anterolateral heads was identified and divided in line with the skin incision.  We did a partial release of the CA ligament.  A small spur was removed with 3 mm Kerrison and then performed a small acromioplasty.  I preserved the deltoid attachment, irrigated  copious synovial fluid, debrided the bursa digitally lysed adhesions.  A full-thickness retracted tear of the rotator cuff was noted.  We noted the biceps tendon which was intact, subscap was intact. Supraspinatus was retracted back to the level of the glenoid and the infraspinatus posteriorly.  Multiple attempts to grasp the supraspinatus resulted in fragmentation of the free degenerated tendon.  This was deemed irreparable.  The infraspinatus we mobilized on its articular and bursal surface had had retracted anteriorly and posteriorly.  We were able to mobilize it to its standard position, fashioned the trough with a bare rongeur.  We placed a SwiveLock suture anchor lateral to the articular surface, fashioning a pilot hole with an awl inserting the SwiveLock, used a Tiger tape.  Excellent resistance to pull out.  We threaded it posteriorly through the infraspinatus.  Advanced approximately 1 cm to its bed and then secured it anterolaterally in the greater tuberosity with another SwiveLock without undue tension.  The arm at the side that securing of the infraspinatus which we felt would help stabilize the joint.  We removed small osteophytes of the anterolateral aspect of the greater tuberosity.  I felt it was contributing to his impingement.  We copiously irrigated the wound; again, re-examined the supraspinatus, again deemed irreparable.  After copious irrigations, the shoulder was stable.  I repaired the raphe with 1 Vicryl, subcu with 2,  and skin with Prolene.  Sterile dressing applied.  Placed in a sling, extubated without difficulty, and transported to the recovery room in satisfactory condition.  The patient tolerated the procedure well.  No complications.  Minimal blood loss.     Marc Stevens, M.D.     Marc Stevens  D:  08/08/2015  T:  08/08/2015  Job:  [id number]

## 2015-08-08 NOTE — Discharge Instructions (Signed)
Aquacel dressing may remain in place until follow up. May shower with aquacel dressing in place. If the dressing becomes saturated or peels off, you may remove it and place a new dressing with gauze and tape which should be kept clean and dry and changed daily. °Use sling at times except when exercising or showering °No driving for 4-6 weeks °No lifting for 6 weeks operative arm °Pendulum exercises as instructed. °Ok to move wrist, elbow, and hand. °See Dr. Eveleigh Crumpler in 10-14 days. Take one aspirin per day with a meal if not on a blood thinner or allergic to aspirin. °

## 2015-08-08 NOTE — H&P (View-Only) (Signed)
Marc Stevens is an 54 y.o. male.   Chief Complaint: R shoulder pain HPI: The patient is a 54 year old male who presents today for follow up of their back. The patient is being followed for their back pain. They are now 18 weeks and 1 day out from lumbar decompession @ L4-5. The patient states that they are doing 99.9 percent better. Current treatment includes: home exercise program. The following medication has been used for pain control: none. The patient reports their current pain level to be mild. The patient has reported improvement of their symptoms with: physical therapy. Note for "Follow-up back": The patient is out of work.  Marc Stevens is 18 weeks status post. His back is doing much better. He would like to return to work on the 22nd at his regular duties. At some point in the fall, he would like to proceed with repairing his rotator cuff. It is persistently painful for him. He has had a history of a rotator cuff actually on his left shoulder, this is his right shoulder  Past Medical History  Diagnosis Date  . History of tobacco abuse     quit 2008  . Chest pain 04/18/2012  . OSA (obstructive sleep apnea)     cpap - does not know settings   . ED (erectile dysfunction)   . Hyperlipidemia   . HTN (hypertension) 04/18/2012  . Low testosterone   . BPH (benign prostatic hypertrophy)     Past Surgical History  Procedure Laterality Date  . Rotator cuff repair  2007    Left  . Doppler echocardiography  2013  . Left kene arthroscopy     . Lumbar laminectomy/decompression microdiscectomy N/A 01/30/2015    Procedure: MICRO LUMBAR DECOMPRESSION, BILATERAL FORAMINOTOMIES, MICRODISCECTOMY LUMBAR FOUR TO LUMBAR FIVE;  Surgeon: Susa Day, MD;  Location: [facility];  Service: Orthopedics;  Laterality: N/A;    Family History  Problem Relation Age of Onset  . Dementia Mother   . Hypertension Mother   . Kidney disease Neg Hx   . Prostate cancer Neg Hx    Social History:  reports that  he quit smoking about 9 years ago. He has never used smokeless tobacco. He reports that he drinks alcohol. He reports that he does not use illicit drugs.  Allergies: No Known Allergies   (Not in a hospital admission)  No results found for this or any previous visit (from the past 48 hour(s)). No results found.  Review of Systems  Constitutional: Negative.   HENT: Negative.   Eyes: Negative.   Respiratory: Negative.   Cardiovascular: Negative.   Gastrointestinal: Negative.   Genitourinary: Negative.   Musculoskeletal: Positive for joint pain.  Skin: Negative.   Neurological: Negative.   Psychiatric/Behavioral: Negative.     There were no vitals taken for this visit. Physical Exam  Constitutional: He appears well-developed and well-nourished.  HENT:  Head: Normocephalic and atraumatic.  Eyes: Conjunctivae are normal.  Neck: Normal range of motion.  Cardiovascular: Normal rate.   Respiratory: Effort normal.  GI: Soft.  Musculoskeletal:  On exam, he is weak in external rotation. He has a positive impingement sign in deep. Tender in the anterior subacromial region.  Neurological: He is alert.  Skin: Skin is warm.    His MRI on February demonstrates full thickness tear of the infraspinatus and supraspinatus with retraction and high-grade tear of the long head of the biceps.  Assessment/Plan Rotator cuff tear, right shoulder, rotator cuff arthropathy.  He will return  him on 22nd full duties with strategies to avoid injury to the shoulder. He indicates can come out for a while to do the shoulder. We did discuss rotator cuff repair.  I had a long discussion with the patient concerning the risks and benefits of a right rotator cuff repair, including bleeding, infection, prolonged postoperative recovery, which may require 3 to 5 months until maximum medical improvement. Overnight procedure with initiation of early passive range of motion within physical therapy. Avoid any active  motion for the first six weeks. This is all in an effort to avoid recurrent tear of the rotator cuff and adhesive capsulitis. Return to work without use of the arm can be obtained following two weeks. However, driving will be a challenge. We also discussed the possibility of requiring implants including bone anchors, as well as an Allograft patch graft if a massive rotator cuff tear is encountered. Removal of any bones for spurs as well as bursitis will be performed during the procedure and also any associated anesthetic complications as well.  Mini open possible patch graft specifically indicated due to the duration. He very well may have an inoperable rotator cuff, however, debridement maybe appropriate, possible biceps tenotomy versus tenodesis. Overnight in the hospital. He has to return to work within 26 weeks to feel that is a reasonable requirement.  Plan Right shoulder mini-open RCR, SAD, possible patch graft  Hideo Googe M. PA-C for Dr. Tonita Cong 07/22/2015, 3:00 PM

## 2015-08-08 NOTE — Anesthesia Postprocedure Evaluation (Signed)
Anesthesia Post Note  Patient: Marc Stevens  Procedure(s) Performed: Procedure(s) (LRB): RIGHT SHOULDER MINI OPEN ROTATOR CUFF REPAIR SUBACROMIAL DECOMPRESSION  (Right)  Anesthesia type: General  Patient location: PACU  Post pain: Pain level controlled and Adequate analgesia  Post assessment: Post-op Vital signs reviewed, Patient's Cardiovascular Status Stable, Respiratory Function Stable, Patent Airway and Pain level controlled  Last Vitals:  Filed Vitals:   08/08/15 1011  BP: 134/77  Pulse: 68  Temp: 36.7 C  Resp: 15    Post vital signs: Reviewed and stable  Level of consciousness: awake, alert  and oriented  Complications: No apparent anesthesia complications

## 2015-08-08 NOTE — Anesthesia Procedure Notes (Addendum)
Anesthesia Regional Block:  Interscalene brachial plexus block  Pre-Anesthetic Checklist: ,, timeout performed, Correct Patient, Correct Site, Correct Laterality, Correct Procedure, Correct Position, site marked, Risks and benefits discussed,  Surgical consent,  Pre-op evaluation,  At surgeon's request and post-op pain management  Laterality: Right  Prep: chloraprep       Needles:  Injection technique: Single-shot  Needle Type: Echogenic Stimulator Needle     Needle Length: 5cm 5 cm Needle Gauge: 22 and 22 G    Additional Needles:  Procedures: ultrasound guided (picture in chart) and nerve stimulator Interscalene brachial plexus block  Nerve Stimulator or Paresthesia:  Response: biceps flexion, 0.45 mA,   Additional Responses:   Narrative:  Start time: 08/08/2015 7:05 AM End time: 08/08/2015 7:18 AM Injection made incrementally with aspirations every 5 mL.  Performed by: Personally  Anesthesiologist: HODIERNE, ADAM  Additional Notes: Functioning IV was confirmed and monitors were applied.  A 48mm 22ga Arrow echogenic stimulator needle was used. Sterile prep and drape,hand hygiene and sterile gloves were used.  Negative aspiration and negative test dose prior to incremental administration of local anesthetic. The patient tolerated the procedure well.  Ultrasound guidance: relevent anatomy identified, needle position confirmed, local anesthetic spread visualized around nerve(s), vascular puncture avoided.  Image printed for medical record.    Procedure Name: Intubation Date/Time: 08/08/2015 7:40 AM Performed by: Dimas Millin, Apphia Cropley F Pre-anesthesia Checklist: Patient identified, Emergency Drugs available, Suction available, Patient being monitored and Timeout performed Patient Re-evaluated:Patient Re-evaluated prior to inductionOxygen Delivery Method: Circle system utilized Preoxygenation: Pre-oxygenation with 100% oxygen Intubation Type: IV induction Ventilation: Mask  ventilation without difficulty Laryngoscope Size: Miller and 2 Grade View: Grade I Tube type: Oral Tube size: 7.5 mm Number of attempts: 1 Airway Equipment and Method: Stylet Placement Confirmation: ETT inserted through vocal cords under direct vision,  positive ETCO2 and breath sounds checked- equal and bilateral Secured at: 22 cm Tube secured with: Tape Dental Injury: Teeth and Oropharynx as per pre-operative assessment

## 2015-08-08 NOTE — Transfer of Care (Signed)
Immediate Anesthesia Transfer of Care Note  Patient: Marc Stevens  Procedure(s) Performed: Procedure(s): RIGHT SHOULDER MINI OPEN ROTATOR CUFF REPAIR SUBACROMIAL DECOMPRESSION  (Right)  Patient Location: PACU  Anesthesia Type:General  Level of Consciousness: awake and sedated  Airway & Oxygen Therapy: Patient Spontanous Breathing and Patient connected to face mask oxygen  Post-op Assessment: Report given to RN and Post -op Vital signs reviewed and stable  Post vital signs: Reviewed and stable  Last Vitals:  Filed Vitals:   08/08/15 0722  BP:   Pulse: 66  Temp:   Resp: 11    Complications: No apparent anesthesia complications

## 2015-08-08 NOTE — Anesthesia Preprocedure Evaluation (Addendum)
Anesthesia Evaluation  Patient identified by MRN, date of birth, ID band Patient awake    Reviewed: Allergy & Precautions, NPO status , Patient's Chart, lab work & pertinent test results  Airway Mallampati: II   Neck ROM: full    Dental   Pulmonary sleep apnea , former smoker,    breath sounds clear to auscultation       Cardiovascular hypertension,  Rhythm:regular Rate:Normal     Neuro/Psych    GI/Hepatic   Endo/Other  Morbid obesity  Renal/GU      Musculoskeletal   Abdominal   Peds  Hematology   Anesthesia Other Findings   Reproductive/Obstetrics                            Anesthesia Physical Anesthesia Plan  ASA: II  Anesthesia Plan: General and Regional   Post-op Pain Management: MAC Combined w/ Regional for Post-op pain   Induction: Intravenous  Airway Management Planned: Oral ETT  Additional Equipment:   Intra-op Plan:   Post-operative Plan: Extubation in OR  Informed Consent: I have reviewed the patients History and Physical, chart, labs and discussed the procedure including the risks, benefits and alternatives for the proposed anesthesia with the patient or authorized representative who has indicated his/her understanding and acceptance.     Plan Discussed with: CRNA, Anesthesiologist and Surgeon  Anesthesia Plan Comments:        Anesthesia Quick Evaluation

## 2015-08-08 NOTE — Progress Notes (Signed)
Pt stated that he will self administer CPAP when ready for bed.  Current settings are Auto CPAP 15-18 via Pt home mask and tubing with 2 LPM O2 bleed in.  Pt to notify RT if any assistance is required, RT to monitor and assess as needed.

## 2015-08-08 NOTE — Progress Notes (Signed)
AssistedDr. Hodierne with right, interscalene  block. Side rails up, monitors on throughout procedure. See vital signs in flow sheet. Tolerated Procedure well.  

## 2015-08-08 NOTE — Brief Op Note (Signed)
08/08/2015  9:04 AM  PATIENT:  Marc Stevens  54 y.o. male  PRE-OPERATIVE DIAGNOSIS:  RIGHT ROTATOR CUFF TEAR  POST-OPERATIVE DIAGNOSIS:  RIGHT ROTATOR CUFF TEAR  PROCEDURE:  Procedure(s): RIGHT SHOULDER MINI OPEN ROTATOR CUFF REPAIR SUBACROMIAL DECOMPRESSION  (Right)  SURGEON:  Surgeon(s) and Role:    * Susa Day, MD - Primary  PHYSICIAN ASSISTANT:   ASSISTANTS: Bissell   ANESTHESIA:   general  EBL:  Total I/O In: 1000 [I.V.:1000] Out: -   BLOOD ADMINISTERED:none  DRAINS: none   LOCAL MEDICATIONS USED:  MARCAINE     SPECIMEN:  No Specimen  DISPOSITION OF SPECIMEN:  N/A  COUNTS:  YES  TOURNIQUET:  * No tourniquets in log *  DICTATION: .Other Dictation: Dictation Number [id number]  PLAN OF CARE: Admit for overnight observation  PATIENT DISPOSITION:  PACU - hemodynamically stable.   Delay start of Pharmacological VTE agent (>24hrs) due to surgical blood loss or risk of bleeding: no

## 2015-08-08 NOTE — Interval H&P Note (Signed)
History and Physical Interval Note:  08/08/2015 7:32 AM  Marc Stevens  has presented today for surgery, with the diagnosis of RIGHT ROTATOR CUFF TEAR  The various methods of treatment have been discussed with the patient and family. After consideration of risks, benefits and other options for treatment, the patient has consented to  Procedure(s): RIGHT SHOULDER MINI OPEN ROTATOR CUFF REPAIR SUBACROMIAL DECOMPRESSION POSSIBLE PATCH GRAFT (Right) as a surgical intervention .  The patient's history has been reviewed, patient examined, no change in status, stable for surgery.  I have reviewed the patient's chart and labs.  Questions were answered to the patient's satisfaction.     Marc Stevens C

## 2015-08-09 DIAGNOSIS — M75121 Complete rotator cuff tear or rupture of right shoulder, not specified as traumatic: Secondary | ICD-10-CM | POA: Diagnosis not present

## 2015-08-09 LAB — BASIC METABOLIC PANEL
ANION GAP: 7 (ref 5–15)
BUN: 14 mg/dL (ref 6–20)
CO2: 28 mmol/L (ref 22–32)
Calcium: 9.1 mg/dL (ref 8.9–10.3)
Chloride: 103 mmol/L (ref 101–111)
Creatinine, Ser: 1.17 mg/dL (ref 0.61–1.24)
GFR calc Af Amer: >60 mL/min (ref >60)
GLUCOSE: 134 mg/dL — AB (ref 65–99)
POTASSIUM: 4.4 mmol/L (ref 3.5–5.1)
Sodium: 138 mmol/L (ref 135–145)

## 2015-08-09 MED ORDER — KETOROLAC TROMETHAMINE 15 MG/ML IJ SOLN
15.0000 mg | Freq: Four times a day (QID) | INTRAMUSCULAR | Status: DC
  Administered 2015-08-09: 15 mg via INTRAVENOUS
  Filled 2015-08-09: qty 1

## 2015-08-09 MED ORDER — KETOROLAC TROMETHAMINE 10 MG PO TABS: 10.0000 mg | ORAL_TABLET | Freq: Four times a day (QID) | ORAL | Status: DC | PRN

## 2015-08-09 NOTE — Discharge Summary (Signed)
[name] MRN: [id number] DOB/AGE: May 27, 1961 54 y.o.  Admit date: 08/08/2015 Discharge date: 08/09/2015  Admission Diagnoses:  Active Problems:   Complete rotator cuff tear   Discharge Diagnoses:  Same  Past Medical History  Diagnosis Date  . OSA (obstructive sleep apnea)     cpap - does not know settings   . ED (erectile dysfunction)   . Hyperlipidemia   . HTN (hypertension) 04/18/2012  . Low testosterone   . BPH (benign prostatic hypertrophy)   . Rotator cuff tear     RT    Surgeries: Procedure(s): RIGHT SHOULDER MINI OPEN ROTATOR CUFF REPAIR SUBACROMIAL DECOMPRESSION  on 08/08/2015   Consultants:    Discharged Condition: Improved  Hospital Course: Marc Stevens is an 54 y.o. male who was admitted 08/08/2015 for operative treatment of rotator cuff tear right shoulder. Patient has severe unremitting pain that affects sleep, daily activities, and work/hobbies. After pre-op clearance the patient was taken to the operating room on 08/08/2015 and underwent  Procedure(s): RIGHT SHOULDER MINI OPEN ROTATOR CUFF REPAIR SUBACROMIAL DECOMPRESSION .    Patient was given perioperative antibiotics: Anti-infectives    Start     Dose/Rate Route Frequency Ordered Stop   08/08/15 1215  ceFAZolin (ANCEF) IVPB 2 g/50 mL premix     2 g 100 mL/hr over 30 Minutes Intravenous Every 6 hours 08/08/15 1114 08/08/15 1853   08/08/15 0908  polymyxin B 500,000 Units, bacitracin 50,000 Units in sodium chloride irrigation 0.9 % 500 mL irrigation  Status:  Discontinued       As needed 08/08/15 0908 08/08/15 0922   08/08/15 0600  ceFAZolin (ANCEF) 3 g in dextrose 5 % 50 mL IVPB     3 g 160 mL/hr over 30 Minutes Intravenous On call to O.R. 08/08/15 0522 08/08/15 0735       Patient was given sequential compression devices, early ambulation, and chemoprophylaxis to prevent DVT.  Patient benefited maximally from hospital stay and there were no complications.    Recent  vital signs: Patient Vitals for the past 24 hrs:  BP Temp Temp src Pulse Resp SpO2  08/09/15 0626 (!) 159/89 mmHg 98.4 F (36.9 C) Oral 71 18 97 %  08/09/15 0115 102/65 mmHg 98.7 F (37.1 C) Oral 69 17 96 %  08/08/15 2218 (!) 141/83 mmHg 98.6 F (37 C) Oral 74 16 98 %  08/08/15 1313 127/71 mmHg 97.8 F (36.6 C) Oral 74 16 93 %     Recent laboratory studies:  Recent Labs  08/09/15 0507  NA 138  K 4.4  CL 103  CO2 28  BUN 14  CREATININE 1.17  GLUCOSE 134*  CALCIUM 9.1     Discharge Medications:     Medication List    TAKE these medications        amLODipine 10 MG tablet  Commonly known as:  NORVASC  Take 1 tablet (10 mg total) by mouth every morning.     aspirin EC 81 MG tablet  Take 1 tablet (81 mg total) by mouth every morning. Resume 4 days post-op     docusate sodium 100 MG capsule  Commonly known as:  COLACE  Take 1 capsule (100 mg total) by mouth 2 (two) times daily as needed for mild constipation.     FISH OIL PO  Take by mouth daily.     hydrochlorothiazide 12.5 MG tablet  Commonly known as:  HYDRODIURIL  Take 12.5 mg by mouth daily.  ketoconazole 2 % cream  Commonly known as:  NIZORAL  Apply topically daily as needed.     ketorolac 10 MG tablet  Commonly known as:  TORADOL  Take 1 tablet (10 mg total) by mouth every 6 (six) hours as needed.     losartan 100 MG tablet  Commonly known as:  COZAAR  Take 100 mg by mouth every evening.     methocarbamol 500 MG tablet  Commonly known as:  ROBAXIN  Take 1 tablet (500 mg total) by mouth 3 (three) times daily between meals as needed for muscle spasms.     oxyCODONE-acetaminophen 10-325 MG tablet  Commonly known as:  PERCOCET  Take 1 tablet by mouth every 4 (four) hours as needed for pain.     sildenafil 20 MG tablet  Commonly known as:  REVATIO  Take 1 tablet (20 mg total) by mouth 5 (five) times daily as needed. Take 2 to 5 tablets 30 minutes prior to intercourse on an empty stomach      Vitamin D3 5000 UNITS Tabs  Take 5,000 Units by mouth daily.        Diagnostic Studies: No results found.  Disposition: 01-Home or Self Care      Discharge Instructions    Call MD / Call 911    Complete by:  As directed   If you experience chest pain or shortness of breath, CALL 911 and be transported to the hospital emergency room.  If you develope a fever above 101 F, pus (white drainage) or increased drainage or redness at the wound, or calf pain, call your surgeon's office.     Constipation Prevention    Complete by:  As directed   Drink plenty of fluids.  Prune juice may be helpful.  You may use a stool softener, such as Colace (over the counter) 100 mg twice a day.  Use MiraLax (over the counter) for constipation as needed.     Diet - low sodium heart healthy    Complete by:  As directed      Increase activity slowly as tolerated    Complete by:  As directed            Follow-up Information    Follow up with BEANE,JEFFREY C, MD In 2 weeks.   Specialty:  Orthopedic Surgery   Contact information:   [address] [place] [address] [phone]        Signed: Cecilie Kicks. 08/09/2015, 12:28 PM

## 2015-08-09 NOTE — Progress Notes (Signed)
PT Cancellation Note  Patient Details Name: ROBBEN JAGIELLO MRN: [id number] DOB: [birth year]   Cancelled Treatment:     PT order received.  Eval deferred following OT screening. Pt with no PT needs at this time.   Orlanda Lemmerman 08/09/2015, 11:41 AM

## 2015-08-09 NOTE — Progress Notes (Signed)
Subjective: 1 Day Post-Op Procedure(s) (LRB): RIGHT SHOULDER MINI OPEN ROTATOR CUFF REPAIR SUBACROMIAL DECOMPRESSION  (Right) Patient reports pain as moderate to severe.  Reports pain started around 3 am as block started wearing off. No residual numbness or tingling at this point. Got about 2 hours of sleep. Asking for something stronger for pain.  Objective: Vital signs in last 24 hours: Temp:  [97.7 F (36.5 C)-98.7 F (37.1 C)] 98.4 F (36.9 C) (10/21 0626) Pulse Rate:  [68-100] 71 (10/21 0626) Resp:  [11-23] 18 (10/21 0626) BP: (102-161)/(65-90) 159/89 mmHg (10/21 0626) SpO2:  [92 %-100 %] 97 % (10/21 0626)  Intake/Output from previous day: 10/20 0701 - 10/21 0700 In: 3121.7 [P.O.:720; I.V.:2301.7; IV Piggyback:100] Out: 500 [Urine:500] Intake/Output this shift:    No results for input(s): HGB in the last 72 hours. No results for input(s): WBC, RBC, HCT, PLT in the last 72 hours.  Recent Labs  08/09/15 0507  NA 138  K 4.4  CL 103  CO2 28  BUN 14  CREATININE 1.17  GLUCOSE 134*  CALCIUM 9.1   No results for input(s): LABPT, INR in the last 72 hours.  Neurologically intact ABD soft Neurovascular intact Sensation intact distally Intact pulses distally Dorsiflexion/Plantar flexion intact Incision: dressing C/D/I and no drainage No cellulitis present Compartment soft no sign of DVT  Assessment/Plan: 1 Day Post-Op Procedure(s) (LRB): RIGHT SHOULDER MINI OPEN ROTATOR CUFF REPAIR SUBACROMIAL DECOMPRESSION  (Right) Advance diet Up with therapy D/C IV fluids  PT, pendulums per protocol Will add toradol now and Rx for home as well If no relief with toradol could consider switching from OxyIR to PO Dilaudid Will discuss with Dr. Tonita Cong Plan likely D/C later today if pain is better controlled with addition of toradol  BISSELL, JACLYN M. 08/09/2015, 8:12 AM

## 2015-08-09 NOTE — Progress Notes (Signed)
Pt to d/c home. No DME needs, no home health needs. Prescriptions given, and pain better controlled since adding the Toradol. AVS reviewed and "My Chart" discussed with pt. Pt capable of verbalizing medications, signs and symptoms of infection, and follow-up appointments. Remains hemodynamically stable. No signs and symptoms of distress. Educated pt to return to ER in the case of SOB, dizziness, or chest pain.

## 2015-08-09 NOTE — Evaluation (Addendum)
Occupational Therapy Evaluation Patient Details Name: Marc Stevens MRN: [id number] DOB: 1961-03-18 Today's Date: 08/09/2015    History of Present Illness Pt is s/p RIGHT SHOULDER MINI OPEN ROTATOR CUFF REPAIR SUBACROMIAL DECOMPRESSION    Clinical Impression   All education provided to pt and issued handout with information also and reviewed. Recommend pt at least have intermittent assist with sling as it is VERY difficult for pt to don sling himself due to his R UE tissue/muscle bulk. Advised pt to try to time routine when one of his friends can drop in and pt can shower, perform pendulums out of sling and then have assist with donning sling. Pt does well with doffing sling. Pt verbalized understanding. Pt tolerated pendulums well. Pt up mobilizing well so no PT needs at this time--informed assigned PT. Pt also with difficulty sliding a washcloth under R UE due to tissue bulk so advised pt to slide his L hand with soap on it under R UE to wash. Pt for d/c today. Pt with no button down shirt so donned pullover shirt over L UE and then let R side drape over top of sling. Pt states he has button down shirts at home.    Follow Up Recommendations  No OT follow up;Supervision - Intermittent  Follow up rehab as MD recommends.   Equipment Recommendations  None recommended by OT    Recommendations for Other Services       Precautions / Restrictions Precautions Precautions: Shoulder Shoulder Interventions: Off for dressing/bathing/exercises;Shoulder sling/immobilizer Precaution Booklet Issued: Yes (comment) Precaution Comments: Issued shoulder care handout and reviewed information. Required Braces or Orthoses: Sling Restrictions Weight Bearing Restrictions: Yes RUE Weight Bearing: Non weight bearing      Mobility Bed Mobility                  Transfers                      Balance                                            ADL                                          General ADL Comments: Issued shoulder care handout and reviewed all instructions with pt. Also issued pendulum exercise handout and pt performed pendulums well. Pt with difficulty donning sling on his own today. He required min assist to manage sling by sliding sling around forearm and over elbow first and then pt able to reach for strap to pull around him. Pt not able to keep sling around elbow without it sliding off without the min assist to help keep it on his arm while he pulls sling around elbow. Pt also tried to put shoulder strap around neck first and then let the sling slide down around his elbow which seemed to help some. Pt becoming increasingly frustrated with number of attempts and time to don sling and though pt finally able to get sling on, have concerns pt will move R shoulder if he becomes too frustrated with process.  Advised pt that he will need to time his shower and dressing routine when he can have a friend drop in to help with the sling (  assist to hold sling in place and guide it around elbow mainly).  Advised pt he could perform pendulums also when friend is there.      Vision     Perception     Praxis      Pertinent Vitals/Pain Pain Assessment: 0-10 Pain Score: 4  Pain Location: R shoulder Pain Descriptors / Indicators: Aching Pain Intervention(s): Repositioned;Monitored during session     Hand Dominance Right   Extremity/Trunk Assessment             Communication Communication Communication: No difficulties   Cognition Arousal/Alertness: Awake/alert Behavior During Therapy: WFL for tasks assessed/performed Overall Cognitive Status: Within Functional Limits for tasks assessed                     General Comments       Exercises       Shoulder Instructions Shoulder Instructions Donning/doffing shirt without moving shoulder: Supervision/safety Method for sponge bathing under operated UE:  Supervision/safety Donning/doffing sling/immobilizer: Supervision/safety;Minimal assistance Correct positioning of sling/immobilizer: Patient able to independently direct caregiver Pendulum exercises (written home exercise program): Supervision/safety ROM for elbow, wrist and digits of operated UE: Supervision/safety Sling wearing schedule (on at all times/off for ADL's): Independent Proper positioning of operated UE when showering: Independent Positioning of UE while sleeping: [place] expects to be discharged to:: Private residence Living Arrangements: Alone Available Help at Discharge: Friend(s);Available PRN/intermittently                                    Prior Functioning/Environment Level of Independence: Independent             OT Diagnosis: Generalized weakness   OT Problem List:     OT Treatment/Interventions:      OT Goals(Current goals can be found in the care plan section) Acute Rehab OT Goals Patient Stated Goal: return to independence. OT Goal Formulation: With patient  OT Frequency:     Barriers to D/C:            Co-evaluation              End of Session    Activity Tolerance: Patient tolerated treatment well Patient left: in chair;with call bell/phone within reach   Time: 1102-1117 OT Time Calculation (min): 40 min Charges:  OT General Charges $OT Visit: 1 Procedure OT Evaluation $Initial OT Evaluation Tier I: 1 Procedure OT Treatments $Self Care/Home Management : 8-22 mins $Therapeutic Exercise: 8-22 mins G-Codes: OT G-codes **NOT FOR INPATIENT CLASS** Functional Assessment Tool Used: clinical judgement Functional Limitation: Self care Self Care Current Status (B5670): At least 1 percent but less than 20 percent impaired, limited or restricted Self Care Goal Status (L4103): At least 1 percent but less than 20 percent impaired, limited or restricted Self Care Discharge Status [phone]):  At least 1 percent but less than 20 percent impaired, limited or restricted  [place], [place] 08/09/2015, 11:54 AM

## 2015-09-02 ENCOUNTER — Ambulatory Visit: Payer: BLUE CROSS/BLUE SHIELD | Attending: Specialist

## 2015-09-02 DIAGNOSIS — I10 Essential (primary) hypertension: Secondary | ICD-10-CM | POA: Diagnosis present

## 2015-09-02 DIAGNOSIS — R5383 Other fatigue: Secondary | ICD-10-CM | POA: Diagnosis present

## 2015-09-02 DIAGNOSIS — G4733 Obstructive sleep apnea (adult) (pediatric): Secondary | ICD-10-CM | POA: Insufficient documentation

## 2015-09-02 DIAGNOSIS — G4761 Periodic limb movement disorder: Secondary | ICD-10-CM | POA: Insufficient documentation

## 2015-10-25 ENCOUNTER — Encounter: Payer: Self-pay | Admitting: Family Medicine

## 2015-10-25 ENCOUNTER — Ambulatory Visit (INDEPENDENT_AMBULATORY_CARE_PROVIDER_SITE_OTHER): Payer: BLUE CROSS/BLUE SHIELD | Admitting: Family Medicine

## 2015-10-25 DIAGNOSIS — N401 Enlarged prostate with lower urinary tract symptoms: Secondary | ICD-10-CM | POA: Diagnosis not present

## 2015-10-25 DIAGNOSIS — E291 Testicular hypofunction: Secondary | ICD-10-CM | POA: Diagnosis not present

## 2015-10-25 DIAGNOSIS — N138 Other obstructive and reflux uropathy: Secondary | ICD-10-CM

## 2015-10-25 DIAGNOSIS — R7989 Other specified abnormal findings of blood chemistry: Secondary | ICD-10-CM

## 2015-10-25 DIAGNOSIS — N528 Other male erectile dysfunction: Secondary | ICD-10-CM | POA: Diagnosis not present

## 2015-10-25 DIAGNOSIS — I10 Essential (primary) hypertension: Secondary | ICD-10-CM | POA: Diagnosis not present

## 2015-10-25 NOTE — Progress Notes (Signed)
BP 135/83 mmHg  Pulse 78  Temp(Src) 98.8 F (37.1 C)  Ht 5\' 11"  (1.803 m)  Wt 290 lb 6.4 oz (131.725 kg)  BMI 40.52 kg/m2  SpO2 95%   Subjective:    [name], male    DOB: 1960/12/06, 55 y.o.   MRN: [id number]  [name] is a 55 y.o. male  Chief Complaint  Patient presents with  . Follow-up    6 month follow-up  . Hypertension  . Surgery    Had shoulder and back surgery.    Patient was referred to nephrologist for hypertension  Checking blood pressures at home every day; we reviewed those readings; he tries to eat french fries without salt; getting a lot of grilled chicken and salad; he was walking every day until he torn his left Achilles  He had a bone spur and then on New year's Eve, made a sudden move and has a slight tear in the Achilles; he has been given two shots which has calmed it down; in cam walker on the left  He had surgery, right rotator cuff done; top looked like "wood chips" the surgeon told him; he doesn't remember what he did to cause that; it is getting better; going to PT twice a week; right-handed; he is good with ADLs and IADLs  He had the back surgery in April, not new since last visit  He has tried the sildanefil; he saw Zara Council; works pretty well; we discussed difference between low libido and erectile dysfunction, and his issue is ED  He has sleep apnea, using new machine; sleeps well and wakes refreshed if he uses 4+ hours; he asked about sleeping pills (which I promptly suggested he NOT use, but try melatonin instead)  Relevant past medical, surgical, family and social history reviewed and updated as indicated. Interim medical history since our last visit reviewed. Allergies and medications reviewed and updated.  Review of Systems Per HPI unless specifically indicated above     Objective:    BP 135/83 mmHg  Pulse 78  Temp(Src) 98.8 F (37.1 C)  Ht 5\' 11"  (1.803 m)  Wt 290 lb 6.4 oz  (131.725 kg)  BMI 40.52 kg/m2  SpO2 95%  Wt Readings from Last 3 Encounters:  10/25/15 290 lb 6.4 oz (131.725 kg)  08/08/15 279 lb (126.554 kg)  08/01/15 279 lb (126.554 kg)    Physical Exam  Constitutional: He appears well-developed and well-nourished. No distress.  HENT:  Head: Normocephalic and atraumatic.  Eyes: EOM are normal. No scleral icterus.  Neck: No thyromegaly present.  Cardiovascular: Normal rate and regular rhythm.   Pulmonary/Chest: Effort normal and breath sounds normal.  Musculoskeletal: He exhibits no edema.  Cam walker left foot/ankle  Skin: Skin is warm and dry. No pallor.  Psychiatric: Judgment and thought content normal.  Good eye contact   Results for orders placed or performed during the hospital encounter of 0000000  Basic metabolic panel  Result Value Ref Range   Sodium 138 135 - 145 mmol/L   Potassium 4.4 3.5 - 5.1 mmol/L   Chloride 103 101 - 111 mmol/L   CO2 28 22 - 32 mmol/L   Glucose, Bld 134 (H) 65 - 99 mg/dL   BUN 14 6 - 20 mg/dL   Creatinine, Ser 1.17 0.61 - 1.24 mg/dL   Calcium 9.1 8.9 - 10.3 mg/dL   GFR calc non Af Amer >60 >60 mL/min   GFR calc Af Amer >60 >60  mL/min   Anion gap 7 5 - 15      Assessment & Plan:   Problem List Items Addressed This Visit      Cardiovascular and Mediastinum   HTN (hypertension) (Chronic)    Glad that he is working with nephrologist to get control of his pressures; weight loss encouraged, along with healthier eating; see AVS; encouraged DASH guidelines, avoidance of decongestants        Genitourinary   ED (erectile dysfunction)    Urologist PA, Zara Council follows him for this      BPH with obstruction/lower urinary tract symptoms    Seen by PA Zara Council (urologist)        Other   Morbid obesity ([place]) - Primary    Encouragement given for weight loss; see AVS      Low testosterone    He has seen urologist PA, Zara Council         Follow up plan: Return when due, for  complete physical.  An after-visit summary was printed and given to the patient at [facility].  Please see the patient instructions which may contain other information and recommendations beyond what is mentioned above in the assessment and plan.

## 2015-10-25 NOTE — Patient Instructions (Addendum)
Check out the information at familydoctor.org entitled "What It Takes to Lose Weight" Try to lose between 1-2 pounds per week by taking in fewer calories and burning off more calories You can succeed by limiting portions, limiting foods dense in calories and fat, becoming more active, and drinking 8 glasses of water a day (64 ounces) Don't skip meals, especially breakfast, as skipping meals may alter your metabolism Do not use over-the-counter weight loss pills or gimmicks that claim rapid weight loss A healthy BMI (or body mass index) is between 18.5 and 24.9 You can calculate your ideal BMI at the [employer] website [employer]fr Your goal blood pressure is less than 140 mmHg on top. Try to follow the DASH guidelines (DASH stands for Dietary Approaches to Stop Hypertension) Try to limit the sodium in your diet.  Ideally, consume less than 1.5 grams (less than 1,500mg ) per day. Do not add salt when cooking or at the table.  Check the sodium amount on labels when shopping, and choose items lower in sodium when given a choice. Avoid or limit foods that already contain a lot of sodium. Eat a diet rich in fruits and vegetables and whole grains. Try to use PLAIN allergy medicine without the decongestant Avoid: phenylephrine, phenylpropanolamine, and pseudoephredine Try melatonin 3 mg at the exact same time of night for 3 weeks to reset your clock

## 2015-10-27 NOTE — Assessment & Plan Note (Addendum)
Glad that he is working with nephrologist to get control of his pressures; weight loss encouraged, along with healthier eating; see AVS; encouraged DASH guidelines, avoidance of decongestants

## 2015-10-27 NOTE — Assessment & Plan Note (Signed)
He has seen urologist PA, Zara Council

## 2015-10-27 NOTE — Assessment & Plan Note (Signed)
Encouragement given for weight loss; see AVS 

## 2015-10-27 NOTE — Assessment & Plan Note (Signed)
Urologist PA, Zara Council follows him for this

## 2015-10-27 NOTE — Assessment & Plan Note (Signed)
Seen by PA Zara Council (urologist)

## 2015-12-27 ENCOUNTER — Encounter: Payer: BLUE CROSS/BLUE SHIELD | Admitting: Family Medicine

## 2015-12-28 ENCOUNTER — Emergency Department (HOSPITAL_COMMUNITY)
Admission: EM | Admit: 2015-12-28 | Discharge: 2015-12-28 | Disposition: A | Discharge status: Home / Self Care | Payer: BLUE CROSS/BLUE SHIELD | Attending: Emergency Medicine | Admitting: Emergency Medicine

## 2015-12-28 ENCOUNTER — Emergency Department (HOSPITAL_COMMUNITY): Payer: BLUE CROSS/BLUE SHIELD

## 2015-12-28 ENCOUNTER — Encounter (HOSPITAL_COMMUNITY): Payer: Self-pay | Admitting: *Deleted

## 2015-12-28 DIAGNOSIS — N529 Male erectile dysfunction, unspecified: Secondary | ICD-10-CM | POA: Diagnosis not present

## 2015-12-28 DIAGNOSIS — G4733 Obstructive sleep apnea (adult) (pediatric): Secondary | ICD-10-CM | POA: Diagnosis not present

## 2015-12-28 DIAGNOSIS — Z7982 Long term (current) use of aspirin: Secondary | ICD-10-CM | POA: Insufficient documentation

## 2015-12-28 DIAGNOSIS — R0781 Pleurodynia: Secondary | ICD-10-CM | POA: Insufficient documentation

## 2015-12-28 DIAGNOSIS — Z87891 Personal history of nicotine dependence: Secondary | ICD-10-CM | POA: Diagnosis not present

## 2015-12-28 DIAGNOSIS — J40 Bronchitis, not specified as acute or chronic: Secondary | ICD-10-CM | POA: Diagnosis not present

## 2015-12-28 DIAGNOSIS — Z8739 Personal history of other diseases of the musculoskeletal system and connective tissue: Secondary | ICD-10-CM | POA: Diagnosis not present

## 2015-12-28 DIAGNOSIS — E785 Hyperlipidemia, unspecified: Secondary | ICD-10-CM | POA: Insufficient documentation

## 2015-12-28 DIAGNOSIS — Z79899 Other long term (current) drug therapy: Secondary | ICD-10-CM | POA: Diagnosis not present

## 2015-12-28 DIAGNOSIS — R0602 Shortness of breath: Secondary | ICD-10-CM | POA: Diagnosis present

## 2015-12-28 DIAGNOSIS — I10 Essential (primary) hypertension: Secondary | ICD-10-CM | POA: Diagnosis not present

## 2015-12-28 LAB — CBC WITH DIFFERENTIAL/PLATELET
BASOS ABS: 0 10*3/uL (ref 0.0–0.1)
BASOS PCT: 1 %
EOS ABS: 0.3 10*3/uL (ref 0.0–0.7)
EOS PCT: 4 %
HCT: 44.4 % (ref 39.0–52.0)
HEMOGLOBIN: 13.6 g/dL (ref 13.0–17.0)
LYMPHS ABS: 2.4 10*3/uL (ref 0.7–4.0)
Lymphocytes Relative: 33 %
MCH: 20.1 pg — AB (ref 26.0–34.0)
MCHC: 30.6 g/dL (ref 30.0–36.0)
MCV: 65.6 fL — ABNORMAL LOW (ref 78.0–100.0)
Monocytes Absolute: 0.9 10*3/uL (ref 0.1–1.0)
Monocytes Relative: 12 %
NEUTROS PCT: 50 %
Neutro Abs: 3.7 10*3/uL (ref 1.7–7.7)
PLATELETS: 327 10*3/uL (ref 150–400)
RBC: 6.77 MIL/uL — AB (ref 4.22–5.81)
RDW: 15.3 % (ref 11.5–15.5)
WBC: 7.3 10*3/uL (ref 4.0–10.5)

## 2015-12-28 LAB — I-STAT CHEM 8, ED
BUN: 16 mg/dL (ref 6–20)
CALCIUM ION: 1.12 mmol/L (ref 1.12–1.23)
CHLORIDE: 100 mmol/L — AB (ref 101–111)
Creatinine, Ser: 1.1 mg/dL (ref 0.61–1.24)
Glucose, Bld: 104 mg/dL — ABNORMAL HIGH (ref 65–99)
HEMATOCRIT: 47 % (ref 39.0–52.0)
Hemoglobin: 16 g/dL (ref 13.0–17.0)
POTASSIUM: 3.9 mmol/L (ref 3.5–5.1)
SODIUM: 142 mmol/L (ref 135–145)
TCO2: 29 mmol/L (ref 0–100)

## 2015-12-28 LAB — I-STAT TROPONIN, ED: TROPONIN I, POC: 0 ng/mL (ref 0.00–0.08)

## 2015-12-28 LAB — D-DIMER, QUANTITATIVE (NOT AT ARMC)

## 2015-12-28 MED ORDER — ALBUTEROL SULFATE (2.5 MG/3ML) 0.083% IN NEBU
5.0000 mg | INHALATION_SOLUTION | Freq: Once | RESPIRATORY_TRACT | Status: AC
  Administered 2015-12-28: 5 mg via RESPIRATORY_TRACT
  Filled 2015-12-28: qty 6

## 2015-12-28 MED ORDER — IPRATROPIUM BROMIDE 0.02 % IN SOLN
0.5000 mg | Freq: Once | RESPIRATORY_TRACT | Status: AC
  Administered 2015-12-28: 0.5 mg via RESPIRATORY_TRACT
  Filled 2015-12-28: qty 2.5

## 2015-12-28 MED ORDER — PREDNISONE 20 MG PO TABS
60.0000 mg | ORAL_TABLET | Freq: Once | ORAL | Status: AC
  Administered 2015-12-28: 60 mg via ORAL
  Filled 2015-12-28: qty 3

## 2015-12-28 MED ORDER — ALBUTEROL SULFATE HFA 108 (90 BASE) MCG/ACT IN AERS: 1.0000 | INHALATION_SPRAY | Freq: Four times a day (QID) | RESPIRATORY_TRACT | Status: DC | PRN

## 2015-12-28 MED ORDER — BENZONATATE 100 MG PO CAPS: 100.0000 mg | ORAL_CAPSULE | Freq: Three times a day (TID) | ORAL | Status: DC

## 2015-12-28 MED ORDER — PREDNISONE 20 MG PO TABS: ORAL_TABLET | ORAL | Status: DC

## 2015-12-28 MED ORDER — IBUPROFEN 800 MG PO TABS
800.0000 mg | ORAL_TABLET | Freq: Once | ORAL | Status: AC
  Administered 2015-12-28: 800 mg via ORAL
  Filled 2015-12-28: qty 1

## 2015-12-28 NOTE — ED Notes (Addendum)
Pt reports SOB, cough, sore throat and right sided rib pain. Pt A+OX4, speaking in complete sentences, and ambulatory to triage.

## 2015-12-28 NOTE — ED Provider Notes (Signed)
CSN: [id number]     Arrival date & time 12/28/15  0010 History   By signing my name below, I, Jolayne Panther, attest that this documentation has been prepared under the direction and in the presence of Kaynan Klonowski, MD. Electronically Signed: Jolayne Panther, Scribe. 12/28/2015. 4:01 AM.   Chief Complaint  Patient presents with  . Shortness of Breath  . Cough   Patient is a 55 y.o. male presenting with cough. The history is provided by the patient. No language interpreter was used.  Cough Cough characteristics:  Dry Severity:  Mild Onset quality:  Sudden Duration:  7 hours Timing:  Constant Progression:  Worsening Chronicity:  New Smoker: used to smoke but has quit.   Relieved by:  Nothing Worsened by:  Nothing tried Ineffective treatments:  None tried Associated symptoms: shortness of breath and sore throat   Shortness of breath:    Severity:  Mild   Onset quality:  Sudden   Duration:  7 hours   Timing:  Constant   Progression:  Unchanged Sore throat:    Severity:  Mild   Onset quality:  Sudden   Duration:  7 hours   Timing:  Constant   Progression:  Unchanged  HPI Comments: Marc Stevens is a 55 y.o. male with a hx of HTN, who presents to the Emergency Department complaining of sudden onset of SOB and dry cough, which began this evening about seven hours ago, but worsened about five hours ago. Pt also notes assocaited sore, scratchy throat and right-sided rib pain. Pt denies any recent long trips, scratchy eyes, rhinorrhea, congestion, and leg swelling. Pt has a hx of smoking for 27 years before he quit ten years ago.   Past Medical History  Diagnosis Date  . OSA (obstructive sleep apnea)     cpap - does not know settings   . ED (erectile dysfunction)   . Hyperlipidemia   . HTN (hypertension) 04/18/2012  . Low testosterone   . BPH (benign prostatic hypertrophy)   . Rotator cuff tear     RT   Past Surgical History  Procedure Laterality Date  .  Rotator cuff repair  2007    Left  . Doppler echocardiography  2013  . Left kene arthroscopy     . Lumbar laminectomy/decompression microdiscectomy N/A 01/30/2015    Procedure: MICRO LUMBAR DECOMPRESSION, BILATERAL FORAMINOTOMIES, MICRODISCECTOMY LUMBAR FOUR TO LUMBAR FIVE;  Surgeon: Susa Day, MD;  Location: [facility];  Service: Orthopedics;  Laterality: N/A;  . Shoulder open rotator cuff repair Right 08/08/2015    Procedure: RIGHT SHOULDER MINI OPEN ROTATOR CUFF REPAIR SUBACROMIAL DECOMPRESSION ;  Surgeon: Susa Day, MD;  Location: [facility];  Service: Orthopedics;  Laterality: Right;   Family History  Problem Relation Age of Onset  . Dementia Mother   . Hypertension Mother   . Kidney disease Neg Hx   . Prostate cancer Neg Hx    Social History  Substance Use Topics  . Smoking status: Former Smoker    Quit date: 10/19/2004  . Smokeless tobacco: Never Used  . Alcohol Use: No    Review of Systems  HENT: Positive for sore throat.   Respiratory: Positive for cough and shortness of breath.   Musculoskeletal: Positive for arthralgias ( right sided rib pain).  All other systems reviewed and are negative.  Allergies  Review of patient's allergies indicates no known allergies.  Home Medications   Prior to Admission medications   Medication Sig Start Date End  Date Taking? Authorizing Provider  amLODipine (NORVASC) 10 MG tablet Take 1 tablet (10 mg total) by mouth every morning. 07/11/15  Yes Arnetha Courser, MD  aspirin EC 81 MG tablet Take 1 tablet (81 mg total) by mouth every morning. Resume 4 days post-op 02/01/15  Yes Cecilie Kicks, PA-C  carvedilol (COREG) 6.25 MG tablet Take 6.25 mg by mouth 2 (two) times daily. 11/29/15  Yes Historical Provider, MD  hydrochlorothiazide (HYDRODIURIL) 12.5 MG tablet Take 12.5 mg by mouth daily. 07/19/15  Yes Historical Provider, MD  losartan (COZAAR) 100 MG tablet Take 100 mg by mouth every evening.    Yes Historical Provider, MD  ofloxacin  (OCUFLOX) 0.3 % ophthalmic solution Place 1 drop into the right eye 3 (three) times daily.  12/25/15  Yes Historical Provider, MD  Omega-3 Fatty Acids (FISH OIL PO) Take by mouth daily. Reported on 10/25/2015   Yes Historical Provider, MD  docusate sodium (COLACE) 100 MG capsule Take 1 capsule (100 mg total) by mouth 2 (two) times daily as needed for mild constipation. Patient not taking: Reported on 10/25/2015 08/08/15   Susa Day, MD  ketoconazole (NIZORAL) 2 % cream Apply topically daily as needed. Patient not taking: Reported on 12/28/2015 05/10/15   Arnetha Courser, MD  ketorolac (TORADOL) 10 MG tablet Take 1 tablet (10 mg total) by mouth every 6 (six) hours as needed. Patient not taking: Reported on 10/25/2015 08/09/15   Cecilie Kicks, PA-C  sildenafil (REVATIO) 20 MG tablet Take 1 tablet (20 mg total) by mouth 5 (five) times daily as needed. Take 2 to 5 tablets 30 minutes prior to intercourse on an empty stomach 06/14/15   Larene Beach A McGowan, PA-C   BP 116/82 mmHg  Pulse 74  Temp(Src) 97.7 F (36.5 C) (Oral)  Resp 16  Ht 5\' 11"  (1.803 m)  Wt 280 lb (127.007 kg)  BMI 39.07 kg/m2  SpO2 94% Physical Exam  Constitutional: He is oriented to person, place, and time. He appears well-developed and well-nourished. No distress.  HENT:  Head: Normocephalic and atraumatic.  Mouth/Throat: Oropharynx is clear and moist.  Eyes: Conjunctivae are normal. Pupils are equal, round, and reactive to light.  Neck: Normal range of motion. Neck supple.  Cardiovascular: Normal rate, regular rhythm and intact distal pulses.   RRR  Pulmonary/Chest: Effort normal. No stridor. No respiratory distress. He has wheezes. He has no rales.  No chords, no tenderness All compartments are soft   Abdominal: Soft. Bowel sounds are normal. He exhibits no distension. There is no tenderness. There is no rebound and no guarding.  Musculoskeletal: Normal range of motion. He exhibits no edema or tenderness.  Lymphadenopathy:     He has no cervical adenopathy.  Neurological: He is alert and oriented to person, place, and time. He has normal reflexes.  Skin: Skin is warm and dry.  Psychiatric: He has a normal mood and affect.  Nursing note and vitals reviewed.  ED Course  Procedures  DIAGNOSTIC STUDIES:    Oxygen Saturation is 94% on RA, adequate by my interpretation.   COORDINATION OF CARE:  4:00 AM Will review labs and imaging. Will administer albuterol, ipratropium, and prednisone in the ED. Discussed treatment plan with pt at bedside and pt agreed to plan.    Labs Review Labs Reviewed  I-STAT CHEM 8, ED - Abnormal; Notable for the following:    Chloride 100 (*)    Glucose, Bld 104 (*)    All other components within normal  limits  CBC WITH DIFFERENTIAL/PLATELET  D-DIMER, QUANTITATIVE (NOT AT [facility])  Randolm Idol, ED   Imaging Review Dg Chest 2 View  12/28/2015  CLINICAL DATA:  Cough and shortness of breath. Sore throat. Right-sided chest pain. EXAM: CHEST  2 VIEW COMPARISON:  04/17/2012 FINDINGS: Heart at the upper limits normal in size. Minimal vascular congestion without overt edema per no consolidation, pleural effusion, or pneumothorax. No acute osseous abnormalities are seen. IMPRESSION: Borderline cardiomegaly.  Vascular congestion. Electronically Signed   By: Jeb Levering M.D.   On: 12/28/2015 01:34   I have personally reviewed and evaluated these images and lab results as part of my medical decision-making.   EKG Interpretation   Date/Time:  Saturday December 28 2015 00:16:14 EST Ventricular Rate:  74 PR Interval:  141 QRS Duration: 92 QT Interval:  367 QTC Calculation: 407 R Axis:   71 Text Interpretation:  Sinus rhythm Confirmed by [facility]  MD, Ashawnti Tangen  ([id number]) on 12/28/2015 2:38:41 AM     MDM   Final diagnoses:  None    Medications  albuterol (PROVENTIL) (2.5 MG/3ML) 0.083% nebulizer solution 5 mg (5 mg Nebulization Given 12/28/15 0027)  albuterol (PROVENTIL) (2.5  MG/3ML) 0.083% nebulizer solution 5 mg (5 mg Nebulization Given 12/28/15 0359)  ipratropium (ATROVENT) nebulizer solution 0.5 mg (0.5 mg Nebulization Given 12/28/15 0359)  predniSONE (DELTASONE) tablet 60 mg (60 mg Oral Given 12/28/15 0359)  ibuprofen (ADVIL,MOTRIN) tablet 800 mg (800 mg Oral Given 12/28/15 0427)   Symptoms consistent with bronchitis will treat with steroids inhaler and tessalon.  Close follow up with your PMD Strict return precautions given  I personally performed the services described in this documentation, which was scribed in my presence. The recorded information has been reviewed and is accurate.      Veatrice Kells, MD 12/28/15 [phone]

## 2015-12-28 NOTE — Discharge Instructions (Signed)
Metered Dose Inhaler (No Spacer Used) Inhaled medicines are the basis of treatment for asthma and other breathing problems. Inhaled medicine can only be effective if used properly. Good technique assures that the medicine reaches the lungs. Metered dose inhalers (MDIs) are used to deliver a variety of inhaled medicines. These include quick relief or rescue medicines (such as bronchodilators) and controller medicines (such as corticosteroids). The medicine is delivered by pushing down on a metal canister to release a set amount of spray. If you are using different kinds of inhalers, use your quick relief medicine to open the airways 10-15 minutes before using a steroid, if instructed to do so by your health care provider. If you are unsure which inhalers to use and the order of using them, ask your health care provider, nurse, or respiratory therapist. HOW TO USE THE INHALER 1. Remove the cap from the inhaler. 2. If you are using the inhaler for the first time, you will need to prime it. Shake the inhaler for 5 seconds and release four puffs into the air, away from your face. Ask your health care provider or pharmacist if you have questions about priming your inhaler. 3. Shake the inhaler for 5 seconds before each breath in (inhalation). 4. Position the inhaler so that the top of the canister faces up. 5. Put your index finger on the top of the medicine canister. Your thumb supports the bottom of the inhaler. 6. Open your mouth. 7. Either place the inhaler between your teeth and place your lips tightly around the mouthpiece, or hold the inhaler 1-2 inches away from your open mouth. If you are unsure of which technique to use, ask your health care provider. 8. Breathe out (exhale) normally and as completely as possible. 9. Press the canister down with the index finger to release the medicine. 10. At the same time as the canister is pressed, inhale deeply and slowly until your lungs are completely filled.  This should take 4-6 seconds. Keep your tongue down. 11. Hold the medicine in your lungs for 5-10 seconds (10 seconds is best). This helps the medicine get into the small airways of your lungs. 12. Breathe out slowly, through pursed lips. Whistling is an example of pursed lips. 13. Wait at least 1 minute between puffs. Continue with the above steps until you have taken the number of puffs your health care provider has ordered. Do not use the inhaler more than your health care provider directs you to. 14. Replace the cap on the inhaler. 15. Follow the directions from your health care provider or the inhaler insert for cleaning the inhaler. If you are using a steroid inhaler, after your last puff, rinse your mouth with water, gargle, and spit out the water. Do not swallow the water. AVOID: 1. Inhaling before or after starting the spray of medicine. It takes practice to coordinate your breathing with triggering the spray. 2. Inhaling through the nose (rather than the mouth) when triggering the spray. HOW TO DETERMINE IF YOUR INHALER IS FULL OR NEARLY EMPTY You cannot know when an inhaler is empty by shaking it. Some inhalers are now being made with dose counters. Ask your health care provider for a prescription that has a dose counter if you feel you need that extra help. If your inhaler does not have a counter, ask your health care provider to help you determine the date you need to refill your inhaler. Write the refill date on a calendar or your inhaler canister. Refill  your inhaler 7-10 days before it runs out. Be sure to keep an adequate supply of medicine. This includes making sure it has not expired, and making sure you have a spare inhaler. SEEK MEDICAL CARE IF:  Symptoms are only partially relieved with your inhaler.  You are having trouble using your inhaler.  You experience an increase in phlegm. SEEK IMMEDIATE MEDICAL CARE IF:  You feel little or no relief with your inhalers. You are  still wheezing and feeling shortness of breath, tightness in your chest, or both.  You have dizziness, headaches, or a fast heart rate.  You have chills, fever, or night sweats.  There is a noticeable increase in phlegm production, or there is blood in the phlegm. MAKE SURE YOU:  Understand these instructions.  Will watch your condition.  Will get help right away if you are not doing well or get worse.   This information is not intended to replace advice given to you by your health care provider. Make sure you discuss any questions you have with your health care provider.   Document Released: 08/02/2007 Document Revised: 10/26/2014 Document Reviewed: 03/23/2013 Elsevier Interactive Patient Education 2016 [employer].  How to Use an Inhaler Proper inhaler technique is very important. Good technique ensures that the medicine reaches the lungs. Poor technique results in depositing the medicine on the tongue and back of the throat rather than in the airways. If you do not use the inhaler with good technique, the medicine will not help you. STEPS TO FOLLOW IF USING AN INHALER WITHOUT AN EXTENSION TUBE 16. Remove the cap from the inhaler. 17. If you are using the inhaler for the first time, you will need to prime it. Shake the inhaler for 5 seconds and release four puffs into the air, away from your face. Ask your health care provider or pharmacist if you have questions about priming your inhaler. 18. Shake the inhaler for 5 seconds before each breath in (inhalation). 19. Position the inhaler so that the top of the canister faces up. 20. Put your index finger on the top of the medicine canister. Your thumb supports the bottom of the inhaler. 21. Open your mouth. 22. Either place the inhaler between your teeth and place your lips tightly around the mouthpiece, or hold the inhaler 1-2 inches away from your open mouth. If you are unsure of which technique to use, ask your health care  provider. 23. Breathe out (exhale) normally and as completely as possible. 24. Press the canister down with your index finger to release the medicine. 25. At the same time as the canister is pressed, inhale deeply and slowly until your lungs are completely filled. This should take 4-6 seconds. Keep your tongue down. 26. Hold the medicine in your lungs for 5-10 seconds (10 seconds is best). This helps the medicine get into the small airways of your lungs. 27. Breathe out slowly, through pursed lips. Whistling is an example of pursed lips. 28. Wait at least 15-30 seconds between puffs. Continue with the above steps until you have taken the number of puffs your health care provider has ordered. Do not use the inhaler more than your health care provider tells you. 29. Replace the cap on the inhaler. 30. Follow the directions from your health care provider or the inhaler insert for cleaning the inhaler. STEPS TO FOLLOW IF USING AN INHALER WITH AN EXTENSION (SPACER) 3. Remove the cap from the inhaler. 4. If you are using the inhaler for the  first time, you will need to prime it. Shake the inhaler for 5 seconds and release four puffs into the air, away from your face. Ask your health care provider or pharmacist if you have questions about priming your inhaler. 5. Shake the inhaler for 5 seconds before each breath in (inhalation). 6. Place the open end of the spacer onto the mouthpiece of the inhaler. 7. Position the inhaler so that the top of the canister faces up and the spacer mouthpiece faces you. 8. Put your index finger on the top of the medicine canister. Your thumb supports the bottom of the inhaler and the spacer. 9. Breathe out (exhale) normally and as completely as possible. 10. Immediately after exhaling, place the spacer between your teeth and into your mouth. Close your lips tightly around the spacer. 11. Press the canister down with your index finger to release the medicine. 12. At the same  time as the canister is pressed, inhale deeply and slowly until your lungs are completely filled. This should take 4-6 seconds. Keep your tongue down and out of the way. 13. Hold the medicine in your lungs for 5-10 seconds (10 seconds is best). This helps the medicine get into the small airways of your lungs. Exhale. 14. Repeat inhaling deeply through the spacer mouthpiece. Again hold that breath for up to 10 seconds (10 seconds is best). Exhale slowly. If it is difficult to take this second deep breath through the spacer, breathe normally several times through the spacer. Remove the spacer from your mouth. 15. Wait at least 15-30 seconds between puffs. Continue with the above steps until you have taken the number of puffs your health care provider has ordered. Do not use the inhaler more than your health care provider tells you. 16. Remove the spacer from the inhaler, and place the cap on the inhaler. 17. Follow the directions from your health care provider or the inhaler insert for cleaning the inhaler and spacer. If you are using different kinds of inhalers, use your quick relief medicine to open the airways 10-15 minutes before using a steroid if instructed to do so by your health care provider. If you are unsure which inhalers to use and the order of using them, ask your health care provider, nurse, or respiratory therapist. If you are using a steroid inhaler, always rinse your mouth with water after your last puff, then gargle and spit out the water. Do not swallow the water. AVOID:  Inhaling before or after starting the spray of medicine. It takes practice to coordinate your breathing with triggering the spray.  Inhaling through the nose (rather than the mouth) when triggering the spray. HOW TO DETERMINE IF YOUR INHALER IS FULL OR NEARLY EMPTY You cannot know when an inhaler is empty by shaking it. A few inhalers are now being made with dose counters. Ask your health care provider for a  prescription that has a dose counter if you feel you need that extra help. If your inhaler does not have a counter, ask your health care provider to help you determine the date you need to refill your inhaler. Write the refill date on a calendar or your inhaler canister. Refill your inhaler 7-10 days before it runs out. Be sure to keep an adequate supply of medicine. This includes making sure it is not expired, and that you have a spare inhaler.  SEEK MEDICAL CARE IF:   Your symptoms are only partially relieved with your inhaler.  You are having trouble  using your inhaler.  You have some increase in phlegm. SEEK IMMEDIATE MEDICAL CARE IF:   You feel little or no relief with your inhalers. You are still wheezing and are feeling shortness of breath or tightness in your chest or both.  You have dizziness, headaches, or a fast heart rate.  You have chills, fever, or night sweats.  You have a noticeable increase in phlegm production, or there is blood in the phlegm. MAKE SURE YOU:   Understand these instructions.  Will watch your condition.  Will get help right away if you are not doing well or get worse.   This information is not intended to replace advice given to you by your health care provider. Make sure you discuss any questions you have with your health care provider.   Document Released: 10/02/2000 Document Revised: 07/26/2013 Document Reviewed: 05/04/2013 Elsevier Interactive Patient Education [employer].

## 2016-01-01 ENCOUNTER — Ambulatory Visit (INDEPENDENT_AMBULATORY_CARE_PROVIDER_SITE_OTHER): Payer: BLUE CROSS/BLUE SHIELD | Admitting: Family Medicine

## 2016-01-01 ENCOUNTER — Encounter: Payer: Self-pay | Admitting: *Deleted

## 2016-01-01 ENCOUNTER — Encounter: Payer: Self-pay | Admitting: Family Medicine

## 2016-01-01 VITALS — BP 138/88 | HR 70 | Temp 97.5°F | Ht 70.75 in | Wt 281.0 lb

## 2016-01-01 DIAGNOSIS — D563 Thalassemia minor: Secondary | ICD-10-CM | POA: Diagnosis not present

## 2016-01-01 DIAGNOSIS — J40 Bronchitis, not specified as acute or chronic: Secondary | ICD-10-CM

## 2016-01-01 DIAGNOSIS — N528 Other male erectile dysfunction: Secondary | ICD-10-CM

## 2016-01-01 DIAGNOSIS — E785 Hyperlipidemia, unspecified: Secondary | ICD-10-CM

## 2016-01-01 DIAGNOSIS — E559 Vitamin D deficiency, unspecified: Secondary | ICD-10-CM

## 2016-01-01 DIAGNOSIS — Z125 Encounter for screening for malignant neoplasm of prostate: Secondary | ICD-10-CM

## 2016-01-01 DIAGNOSIS — I517 Cardiomegaly: Secondary | ICD-10-CM | POA: Diagnosis not present

## 2016-01-01 DIAGNOSIS — I10 Essential (primary) hypertension: Secondary | ICD-10-CM

## 2016-01-01 DIAGNOSIS — Z0000 Encounter for general adult medical examination without abnormal findings: Secondary | ICD-10-CM | POA: Insufficient documentation

## 2016-01-01 DIAGNOSIS — E669 Obesity, unspecified: Secondary | ICD-10-CM | POA: Diagnosis not present

## 2016-01-01 MED ORDER — AZITHROMYCIN 250 MG PO TABS: ORAL_TABLET | ORAL | Status: AC

## 2016-01-01 NOTE — Assessment & Plan Note (Signed)
Goal BP discussed

## 2016-01-01 NOTE — Assessment & Plan Note (Signed)
CBC from ER reviewed; unchanged over years

## 2016-01-01 NOTE — Assessment & Plan Note (Signed)
USPSTF grade A and B recommendations reviewed with patient; age-appropriate recommendations, preventive care, screening tests, etc discussed and encouraged; healthy living encouraged; see AVS for patient education given to patient; he agreed to DRE and PSA testing after discussion of controversy surrounding prostate cancer screening

## 2016-01-01 NOTE — Assessment & Plan Note (Signed)
May be sign of vascular disease elsewhere, including CAD; refer to cardiologist for evaluation, consider stress test

## 2016-01-01 NOTE — Assessment & Plan Note (Signed)
Check labs in the next few weeks; he'll return fasting

## 2016-01-01 NOTE — Assessment & Plan Note (Signed)
Check labs when he returns in the next few weeks for fasting labs

## 2016-01-01 NOTE — Progress Notes (Signed)
[name], male   DOB: 05/16/1961, 55 y.o.   MRN: [id number]   Subjective:   Marc Stevens is a 55 y.o. male here for a complete physical exam  Interim issues since last visit: he got to coughing last week and then felt real tight like he'd been punched in the ribs; went to the ER; CXR done; put him on antibiotics; asked about L-arginine  USPSTF grade A and B recommendations Alcohol: occasional Depression:  Depression screen [facility] 2/9 01/01/2016  Decreased Interest 0  Down, Depressed, Hopeless 0  PHQ - 2 Score 0   Hypertension: [id number] systolic over [id number] diastolic; lowest [id number]; tries to limit salt in the diet Obesity: he would like to get down to 240 pounds; started walking Tobacco use: not HIV, hep C: tested in the 2010 and 2011 STD testing and prevention (chl/gon/syphilis): asx Lipids: return fasting Glucose: return fasting Colorectal cancer: 2013; father had colon cancer; next due in 2018 Breast cancer: no lumps Lung cancer: n/a Osteoporosis: n/a AAA: n/a Aspirin: taking aspirin Diet: does eat eggs, limit yolks to 3 per week Exercise: started walking Skin cancer: no worrisome moles   Past Medical History  Diagnosis Date  . OSA (obstructive sleep apnea)     cpap - does not know settings   . ED (erectile dysfunction)   . Hyperlipidemia   . HTN (hypertension) 04/18/2012  . Low testosterone   . BPH (benign prostatic hypertrophy)   . Rotator cuff tear     RT   Past Surgical History  Procedure Laterality Date  . Rotator cuff repair  2007    Left  . Doppler echocardiography  2013  . Left kene arthroscopy     . Lumbar laminectomy/decompression microdiscectomy N/A 01/30/2015    Procedure: MICRO LUMBAR DECOMPRESSION, BILATERAL FORAMINOTOMIES, MICRODISCECTOMY LUMBAR FOUR TO LUMBAR FIVE;  Surgeon: Susa Day, MD;  Location: [facility];  Service: Orthopedics;  Laterality: N/A;  . Shoulder open rotator cuff repair Right 08/08/2015    Procedure:  RIGHT SHOULDER MINI OPEN ROTATOR CUFF REPAIR SUBACROMIAL DECOMPRESSION ;  Surgeon: Susa Day, MD;  Location: [facility];  Service: Orthopedics;  Laterality: Right;   Family History  Problem Relation Age of Onset  . Dementia Mother   . Hypertension Mother   . Kidney disease Neg Hx   . Prostate cancer Neg Hx   . COPD Neg Hx   . Diabetes Neg Hx   . Stroke Neg Hx   . Cancer Father     colon  . Heart disease Father   . Congestive Heart Failure Father     Social History  Substance Use Topics  . Smoking status: Former Smoker -- 0.50 packs/day for 30 years    Types: Cigarettes    Quit date: 10/19/2004  . Smokeless tobacco: Never Used  . Alcohol Use: No     Comment: occasional   Review of Systems  Constitutional: Negative for unexpected weight change.  HENT: Negative for nosebleeds.   Eyes: Positive for visual disturbance (little blurry vision, got eye drops and better).  Respiratory: Positive for cough, shortness of breath and wheezing.   Cardiovascular: Negative for chest pain, palpitations and leg swelling.  Gastrointestinal: Negative for blood in stool.  Endocrine: Negative for polydipsia and polyuria.  Genitourinary: Negative for hematuria.  Musculoskeletal: Negative for arthralgias.  Neurological: Negative for tremors.  Hematological: Negative for adenopathy.    Objective:   Filed Vitals:   01/01/16 0949  BP: 138/88  Pulse: 70  Temp: 97.5 F (36.4 C)  Height: 5' 10.75" (1.797 m)  Weight: 281 lb (127.461 kg)  SpO2: 97%   Body mass index is 39.47 kg/(m^2). Wt Readings from Last 3 Encounters:  01/01/16 281 lb (127.461 kg)  12/28/15 280 lb (127.007 kg)  10/25/15 290 lb 6.4 oz (131.725 kg)   Physical Exam  Constitutional: He appears well-developed and well-nourished. No distress.  HENT:  Head: Normocephalic and atraumatic.  Nose: Nose normal.  Mouth/Throat: Oropharynx is clear and moist.  Eyes: EOM are normal. No scleral icterus.  Neck: No JVD present. No  thyromegaly present.  Cardiovascular: Normal rate, regular rhythm and normal heart sounds.   Pulmonary/Chest: Effort normal. No respiratory distress. He has wheezes. He has rhonchi (scattered rhonchi, RLL, LUL). He has no rales.  Abdominal: Soft. Bowel sounds are normal. He exhibits no distension. There is no tenderness. There is no guarding.  Genitourinary: Rectal exam shows no external hemorrhoid, no mass and anal tone normal. Guaiac negative stool. Prostate is not enlarged and not tender.  Musculoskeletal: Normal range of motion. He exhibits no edema.  Lymphadenopathy:    He has no cervical adenopathy.  Neurological: He is alert. He displays normal reflexes. He exhibits normal muscle tone. Coordination normal.  Skin: Skin is warm and dry. No rash noted. He is not diaphoretic. No erythema. No pallor.  Psychiatric: He has a normal mood and affect. His behavior is normal. Judgment and thought content normal.    Assessment/Plan:   Problem List Items Addressed This Visit      Cardiovascular and Mediastinum   HTN (hypertension) (Chronic)    Goal BP discussed      Relevant Orders   Ambulatory referral to Cardiology   LVH (left ventricular hypertrophy)    Noted on echo from 2013; heart upper limits of normal on recent CXR in ER and also with possible vascular congestion; refer to cardiologist for evaluation, consider repeat echo; discussed obesity and HTN as contributing factors; his father has CHF      Relevant Orders   Echocardiogram   Ambulatory referral to Cardiology     Genitourinary   ED (erectile dysfunction)    May be sign of vascular disease elsewhere, including CAD; refer to cardiologist for evaluation, consider stress test      Relevant Orders   Ambulatory referral to Cardiology     Other   Beta thalassemia minor (Chronic)    CBC from ER reviewed; unchanged over years      Obesity    With recent weight loss, patient's BMI now under 40; encouragement given; will  have him see cardiologist before he pursues significant exercise; no current chest pain; he will try to get down to 240 pounds as his next goal      Relevant Orders   TSH   Hyperlipidemia    Check labs in the next few weeks; he'll return fasting      Relevant Orders   Ambulatory referral to Cardiology   Vitamin D deficiency    Check labs when he returns in the next few weeks for fasting labs      Relevant Orders   VITAMIN D 25 Hydroxy (Vit-D Deficiency, Fractures)   Preventative health care - Primary    USPSTF grade A and B recommendations reviewed with patient; age-appropriate recommendations, preventive care, screening tests, etc discussed and encouraged; healthy living encouraged; see AVS for patient education given to patient; he agreed to DRE and PSA testing after discussion of controversy surrounding prostate cancer  screening      Relevant Orders   Lipid Panel w/o Chol/HDL Ratio   Comprehensive metabolic panel    Other Visit Diagnoses    Bronchitis        rest, hydration, vit C, green tea; add azithromycin; recommended he start today; he'll pick up Rx tomorrow; to ER or UC if worse; try Mucinex    Prostate cancer screening        Relevant Orders    PSA       Meds ordered this encounter  Medications  . azithromycin (ZITHROMAX) 250 MG tablet    Sig: Two pills today and then one daily for four more days; PO    Dispense:  6 tablet    Refill:  0   Orders Placed This Encounter  Procedures  . Lipid Panel w/o Chol/HDL Ratio    Standing Status: Future     Number of Occurrences:      Standing Expiration Date: 02/16/2016    Order Specific Question:  Has the patient fasted?    Answer:  Yes  . Comprehensive metabolic panel    Standing Status: Future     Number of Occurrences:      Standing Expiration Date: 02/16/2016    Order Specific Question:  Has the patient fasted?    Answer:  Yes  . TSH    Standing Status: Future     Number of Occurrences:      Standing Expiration  Date: 02/16/2016  . VITAMIN D 25 Hydroxy (Vit-D Deficiency, Fractures)    Standing Status: Future     Number of Occurrences:      Standing Expiration Date: 02/16/2016  . PSA    Standing Status: Future     Number of Occurrences:      Standing Expiration Date: 02/16/2016  . Ambulatory referral to Cardiology    Referral Priority:  Routine    Referral Type:  Consultation    Referral Reason:  Specialty Services Required    Requested Specialty:  Cardiology    Number of Visits Requested:  1  . Echocardiogram    Standing Status: Future     Number of Occurrences:      Standing Expiration Date: 04/02/2017    Scheduling Instructions:     Fridays are best; mild LVH; compare to ECHO done 2013    Order Specific Question:  Where should this test be performed    Answer:  [facility]    Order Specific Question:  Complete or Limited study?    Answer:  Complete    Order Specific Question:  With Image Enhancing Agent or without Image Enhancing Agent?    Answer:  Without Image Enhancing Agent    Order Specific Question:  Contraindication for Image Enhancing Agent?    Answer:  Per Appointment Conversion    Order Specific Question:  Expected Date:    Answer:  1 month    Order Specific Question:  Reason for exam-Echo    Answer:  Cardiomegaly  429.3 / I51.7    Follow up plan: Return in about 1 year (around 12/31/2016) for complete physical; 6 months for routine.  An after-visit summary was printed and given to the patient at [place].  Please see the patient instructions which may contain other information and recommendations beyond what is mentioned above in the assessment and plan. Orders Placed This Encounter  Procedures  . Lipid Panel w/o Chol/HDL Ratio  . Comprehensive metabolic panel  . TSH  . VITAMIN D 25 Hydroxy (Vit-D  Deficiency, Fractures)  . PSA  . Ambulatory referral to Cardiology  . Echocardiogram

## 2016-01-01 NOTE — Assessment & Plan Note (Signed)
With recent weight loss, patient's BMI now under 40; encouragement given; will have him see cardiologist before he pursues significant exercise; no current chest pain; he will try to get down to 240 pounds as his next goal

## 2016-01-01 NOTE — Assessment & Plan Note (Addendum)
Noted on echo from 2013; heart upper limits of normal on recent CXR in ER and also with possible vascular congestion; refer to cardiologist for evaluation, consider repeat echo; discussed obesity and HTN as contributing factors; his father has CHF

## 2016-01-01 NOTE — Patient Instructions (Addendum)
Let's get you a stress test and echocardiogram with a cardiologist Return for fasting labs in the next few weeks You can take Mucinex to loosen phlegm Start the new antibiotics To ER or urgent care if worse Rest and hydration and vitamin C and green tea Good luck with weight loss efforts

## 2016-01-03 ENCOUNTER — Other Ambulatory Visit: Payer: Self-pay | Admitting: Family Medicine

## 2016-01-03 NOTE — Telephone Encounter (Signed)
Coming in for labs any day now; Rx approved

## 2016-01-10 ENCOUNTER — Ambulatory Visit
Admission: RE | Admit: 2016-01-10 | Discharge: 2016-01-10 | Disposition: A | Discharge status: Home / Self Care | Payer: BLUE CROSS/BLUE SHIELD | Source: Ambulatory Visit | Attending: Family Medicine | Admitting: Family Medicine

## 2016-01-10 DIAGNOSIS — G4730 Sleep apnea, unspecified: Secondary | ICD-10-CM | POA: Insufficient documentation

## 2016-01-10 DIAGNOSIS — N400 Enlarged prostate without lower urinary tract symptoms: Secondary | ICD-10-CM | POA: Diagnosis not present

## 2016-01-10 DIAGNOSIS — I340 Nonrheumatic mitral (valve) insufficiency: Secondary | ICD-10-CM | POA: Diagnosis not present

## 2016-01-10 DIAGNOSIS — I517 Cardiomegaly: Secondary | ICD-10-CM

## 2016-01-10 DIAGNOSIS — E785 Hyperlipidemia, unspecified: Secondary | ICD-10-CM | POA: Insufficient documentation

## 2016-01-10 DIAGNOSIS — I119 Hypertensive heart disease without heart failure: Secondary | ICD-10-CM | POA: Diagnosis not present

## 2016-01-10 NOTE — Progress Notes (Signed)
*  PRELIMINARY RESULTS* Echocardiogram 2D Echocardiogram has been performed.  Marc Stevens 01/10/2016, 9:57 AM

## 2016-01-14 ENCOUNTER — Telehealth: Payer: Self-pay | Admitting: Family Medicine

## 2016-01-14 DIAGNOSIS — I517 Cardiomegaly: Secondary | ICD-10-CM

## 2016-01-14 DIAGNOSIS — I340 Nonrheumatic mitral (valve) insufficiency: Secondary | ICD-10-CM

## 2016-01-14 DIAGNOSIS — I5189 Other ill-defined heart diseases: Secondary | ICD-10-CM

## 2016-01-14 NOTE — Telephone Encounter (Signed)
We've put in a call to cardiology/echo dept; awaiting call back; contact patient with echo results

## 2016-01-15 ENCOUNTER — Ambulatory Visit: Payer: BLUE CROSS/BLUE SHIELD | Admitting: Cardiology

## 2016-01-17 DIAGNOSIS — I340 Nonrheumatic mitral (valve) insufficiency: Secondary | ICD-10-CM | POA: Insufficient documentation

## 2016-01-17 DIAGNOSIS — I5189 Other ill-defined heart diseases: Secondary | ICD-10-CM | POA: Insufficient documentation

## 2016-01-17 NOTE — Telephone Encounter (Signed)
Talked with patient about echo report; he has upcoming appt with cardiologist Explained normal LVEF; delayed relaxation (diastolic dysfxn), concentric LVH; need to work on weight loss; if anything I can do to help in that regard, let me know (referral to nutritionist, Rx for Weight [employer] for tax purposes, etc.); he can consider and call me back I read about L-arginine at his request; advised him to chat with urologist because of possible interaction with sildenafil; he agrees; if okay with urologist, okay with me

## 2016-02-21 ENCOUNTER — Other Ambulatory Visit: Payer: Self-pay | Admitting: Family Medicine

## 2016-02-21 ENCOUNTER — Encounter: Payer: Self-pay | Admitting: Family Medicine

## 2016-02-21 ENCOUNTER — Telehealth: Payer: Self-pay | Admitting: Urology

## 2016-02-21 ENCOUNTER — Ambulatory Visit (INDEPENDENT_AMBULATORY_CARE_PROVIDER_SITE_OTHER): Payer: BLUE CROSS/BLUE SHIELD | Admitting: Family Medicine

## 2016-02-21 VITALS — BP 132/72 | HR 80 | Temp 98.4°F | Resp 16 | Wt 284.0 lb

## 2016-02-21 DIAGNOSIS — S86012S Strain of left Achilles tendon, sequela: Secondary | ICD-10-CM

## 2016-02-21 DIAGNOSIS — R0789 Other chest pain: Secondary | ICD-10-CM

## 2016-02-21 DIAGNOSIS — E785 Hyperlipidemia, unspecified: Secondary | ICD-10-CM

## 2016-02-21 DIAGNOSIS — I10 Essential (primary) hypertension: Secondary | ICD-10-CM

## 2016-02-21 DIAGNOSIS — E669 Obesity, unspecified: Secondary | ICD-10-CM

## 2016-02-21 NOTE — Assessment & Plan Note (Signed)
Encouraged weight loss, see AVS 

## 2016-02-21 NOTE — Assessment & Plan Note (Signed)
Check labs today; orders given; work on weight loss

## 2016-02-21 NOTE — Assessment & Plan Note (Signed)
Reproducible; most likely musculoskeletal; suggested chiropractor to see if something out of line (rib?); if he feels any lumps or this is not improving, then let me know for further work-up/imaging

## 2016-02-21 NOTE — Patient Instructions (Signed)
Your goal blood pressure is less than 140 mmHg on top. Try to follow the DASH guidelines (DASH stands for Dietary Approaches to Stop Hypertension) Try to limit the sodium in your diet.  Ideally, consume less than 1.5 grams (less than 1,500mg ) per day. Do not add salt when cooking or at the table.  Check the sodium amount on labels when shopping, and choose items lower in sodium when given a choice. Avoid or limit foods that already contain a lot of sodium. Eat a diet rich in fruits and vegetables and whole grains. Check out the information at familydoctor.org entitled "Nutrition..." https://familydoctor.org/nutrition-weight-loss-need-know-fad-diets/ Try to lose between 1-2 pounds per week by taking in fewer calories and burning off more calories You can succeed by limiting portions, limiting foods dense in calories and fat, becoming more active, and drinking 8 glasses of water a day (64 ounces) Don't skip meals, especially breakfast, as skipping meals may alter your metabolism Do not use over-the-counter weight loss pills or gimmicks that claim rapid weight loss A healthy BMI (or body mass index) is between 18.5 and 24.9 You can calculate your ideal BMI at the [employer] website [employer]fr

## 2016-02-21 NOTE — Assessment & Plan Note (Signed)
So important to work on weight loss, DASH guidelines; continue meds; patient monitoring at home, watching sodium content in foods; return in 6 months; check Cr today

## 2016-02-21 NOTE — Telephone Encounter (Signed)
Pt stopped by office and wanted to know if he can take Arginine with what he is currently on.  Please advise pt [phone]

## 2016-02-21 NOTE — Assessment & Plan Note (Signed)
Limiting activity, so I understand some modest weight gain

## 2016-02-21 NOTE — Progress Notes (Signed)
BP 132/72 mmHg  Pulse 80  Temp(Src) 98.4 F (36.9 C) (Oral)  Resp 16  Wt 284 lb (128.822 kg)  SpO2 95%   Subjective:    [name], male    DOB: March 24, 1961, 55 y.o.   MRN: [id number]  [name] is a 55 y.o. male  Chief Complaint  Patient presents with  . Follow-up    hypertension   Patient is well-known to me, here for follow-up High blood pressure; checking BP at home BID; range in the 134 on top, 70-82 on the bottom Looking at sodium content when he buys things Drinking pomegranate juice Walking slowly with torn achilles; seeing ortho for that He thinks the weight gain is due to what he is eating, not able to be as active He does grill, uses [employer]  Has had some discomfort over the right chest under the nipple; he feels it when he extends his right arm out like practicing a golf swing; very specific movement causes the discomfort and is completely reproducible; he has not felt any lumps in his breast or right axilla; no SHOB  Depression screen [facility] 2/9 02/21/2016 01/01/2016  Decreased Interest 0 0  Down, Depressed, Hopeless 0 0  PHQ - 2 Score 0 0   Relevant past medical, surgical, family and social history reviewed Past Medical History  Diagnosis Date  . OSA (obstructive sleep apnea)     cpap - does not know settings   . ED (erectile dysfunction)   . Hyperlipidemia   . HTN (hypertension) 04/18/2012  . Low testosterone   . BPH (benign prostatic hypertrophy)   . Rotator cuff tear     RT  . Torn Achilles tendon 07/11/2015   Past Surgical History  Procedure Laterality Date  . Rotator cuff repair  2007    Left  . Doppler echocardiography  2013  . Left kene arthroscopy     . Lumbar laminectomy/decompression microdiscectomy N/A 01/30/2015    Procedure: MICRO LUMBAR DECOMPRESSION, BILATERAL FORAMINOTOMIES, MICRODISCECTOMY LUMBAR FOUR TO LUMBAR FIVE;  Surgeon: Susa Day, MD;  Location: [facility];  Service: Orthopedics;   Laterality: N/A;  . Shoulder open rotator cuff repair Right 08/08/2015    Procedure: RIGHT SHOULDER MINI OPEN ROTATOR CUFF REPAIR SUBACROMIAL DECOMPRESSION ;  Surgeon: Susa Day, MD;  Location: [facility];  Service: Orthopedics;  Laterality: Right;   Family History  Problem Relation Age of Onset  . Dementia Mother   . Hypertension Mother   . Kidney disease Neg Hx   . Prostate cancer Neg Hx   . COPD Neg Hx   . Diabetes Neg Hx   . Stroke Neg Hx   . Cancer Father     colon  . Heart disease Father   . Congestive Heart Failure Father    Social History  Substance Use Topics  . Smoking status: Former Smoker -- 0.50 packs/day for 30 years    Types: Cigarettes    Quit date: 10/19/2004  . Smokeless tobacco: Never Used  . Alcohol Use: 0.0 oz/week    0 Standard drinks or equivalent per week     Comment: occasional   Interim medical history since last visit reviewed. Allergies and medications reviewed  Review of Systems Per HPI unless specifically indicated above     Objective:    BP 132/72 mmHg  Pulse 80  Temp(Src) 98.4 F (36.9 C) (Oral)  Resp 16  Wt 284 lb (128.822 kg)  SpO2 95%  Wt Readings  from Last 3 Encounters:  02/21/16 284 lb (128.822 kg)  01/01/16 281 lb (127.461 kg)  12/28/15 280 lb (127.007 kg)   body mass index is 39.89 kg/(m^2).  Physical Exam  Constitutional: He appears well-developed and well-nourished. No distress.  HENT:  Head: Normocephalic and atraumatic.  Eyes: EOM are normal. No scleral icterus.  Neck: No thyromegaly present.  Cardiovascular: Normal rate and regular rhythm.   Pulmonary/Chest: Effort normal and breath sounds normal. He exhibits no mass, no tenderness, no bony tenderness, no crepitus, no edema, no deformity, no swelling and no retraction. Right breast exhibits no inverted nipple, no mass, no nipple discharge, no skin change and no tenderness.  Point of tenderness occurs with extension of right arm to the side, then felt under the right  nipple; no pain over the sternum or costochondral junction  Abdominal: Soft. Bowel sounds are normal. He exhibits no distension.  Musculoskeletal: He exhibits no edema.       Left ankle: He exhibits ecchymosis (bruising medial left ankle).  Lymphadenopathy:    He has no axillary adenopathy.  Neurological: Coordination normal.  Skin: Skin is warm and dry. No pallor.  Psychiatric: He has a normal mood and affect. His behavior is normal. Judgment and thought content normal.    Results for orders placed or performed during the hospital encounter of 12/28/15  CBC with Differential/Platelet  Result Value Ref Range   WBC 7.3 4.0 - 10.5 K/uL   RBC 6.77 (H) 4.22 - 5.81 MIL/uL   Hemoglobin 13.6 13.0 - 17.0 g/dL   HCT 44.4 39.0 - 52.0 %   MCV 65.6 (L) 78.0 - 100.0 fL   MCH 20.1 (L) 26.0 - 34.0 pg   MCHC 30.6 30.0 - 36.0 g/dL   RDW 15.3 11.5 - 15.5 %   Platelets 327 150 - 400 K/uL   Neutrophils Relative % 50 %   Neutro Abs 3.7 1.7 - 7.7 K/uL   Lymphocytes Relative 33 %   Lymphs Abs 2.4 0.7 - 4.0 K/uL   Monocytes Relative 12 %   Monocytes Absolute 0.9 0.1 - 1.0 K/uL   Eosinophils Relative 4 %   Eosinophils Absolute 0.3 0.0 - 0.7 K/uL   Basophils Relative 1 %   Basophils Absolute 0.0 0.0 - 0.1 K/uL  D-dimer, quantitative (not at [facility])  Result Value Ref Range   D-Dimer, Quant <0.27 0.00 - 0.50 ug/mL-FEU  I-Stat Chem 8, ED  Result Value Ref Range   Sodium 142 135 - 145 mmol/L   Potassium 3.9 3.5 - 5.1 mmol/L   Chloride 100 (L) 101 - 111 mmol/L   BUN 16 6 - 20 mg/dL   Creatinine, Ser 1.10 0.61 - 1.24 mg/dL   Glucose, Bld 104 (H) 65 - 99 mg/dL   Calcium, Ion 1.12 1.12 - 1.23 mmol/L   TCO2 29 0 - 100 mmol/L   Hemoglobin 16.0 13.0 - 17.0 g/dL   HCT 47.0 39.0 - 52.0 %  I-stat troponin, ED  Result Value Ref Range   Troponin i, poc 0.00 0.00 - 0.08 ng/mL   Comment 3              Assessment & Plan:   Problem List Items Addressed This Visit      Cardiovascular and Mediastinum   HTN  (hypertension) - Primary (Chronic)    So important to work on weight loss, DASH guidelines; continue meds; patient monitoring at home, watching sodium content in foods; return in 6 months; check Cr today  Musculoskeletal and Integument   Torn Achilles tendon    Limiting activity, so I understand some modest weight gain        Other   Obesity    Encouraged weight loss, see AVS      Hyperlipidemia    Check labs today; orders given; work on weight loss      Right-sided chest wall pain    Reproducible; most likely musculoskeletal; suggested chiropractor to see if something out of line (rib?); if he feels any lumps or this is not improving, then let me know for further work-up/imaging         Follow up plan: Return in about 6 months (around 08/23/2016) for visit.  An after-visit summary was printed and given to the patient at [place].  Please see the patient instructions which may contain other information and recommendations beyond what is mentioned above in the assessment and plan.  Labs today:  PSA, lipids, CMP, TSH, vit D

## 2016-02-22 LAB — COMPREHENSIVE METABOLIC PANEL
A/G RATIO: 2.2 (ref 1.2–2.2)
ALK PHOS: 69 IU/L (ref 39–117)
ALT: 22 IU/L (ref 0–44)
AST: 22 IU/L (ref 0–40)
Albumin: 4.2 g/dL (ref 3.5–5.5)
BUN/Creatinine Ratio: 18 (ref 9–20)
BUN: 19 mg/dL (ref 6–24)
Bilirubin Total: 0.3 mg/dL (ref 0.0–1.2)
CO2: 26 mmol/L (ref 18–29)
Calcium: 9.5 mg/dL (ref 8.7–10.2)
Chloride: 103 mmol/L (ref 96–106)
Creatinine, Ser: 1.06 mg/dL (ref 0.76–1.27)
GFR calc Af Amer: 92 mL/min/{1.73_m2} (ref >59)
GFR calc non Af Amer: 79 mL/min/{1.73_m2} (ref >59)
GLOBULIN, TOTAL: 1.9 g/dL (ref 1.5–4.5)
Glucose: 108 mg/dL — ABNORMAL HIGH (ref 65–99)
POTASSIUM: 4.1 mmol/L (ref 3.5–5.2)
SODIUM: 141 mmol/L (ref 134–144)
Total Protein: 6.1 g/dL (ref 6.0–8.5)

## 2016-02-22 LAB — LIPID PANEL W/O CHOL/HDL RATIO
CHOLESTEROL TOTAL: 137 mg/dL (ref 100–199)
HDL: 48 mg/dL (ref >39)
LDL Calculated: 71 mg/dL (ref 0–99)
TRIGLYCERIDES: 88 mg/dL (ref 0–149)
VLDL Cholesterol Cal: 18 mg/dL (ref 5–40)

## 2016-02-22 LAB — PSA: Prostate Specific Ag, Serum: 1.2 ng/mL (ref 0.0–4.0)

## 2016-02-22 LAB — VITAMIN D 25 HYDROXY (VIT D DEFICIENCY, FRACTURES): Vit D, 25-Hydroxy: 28.3 ng/mL — ABNORMAL LOW (ref 30.0–100.0)

## 2016-02-22 LAB — TSH: TSH: 0.845 u[IU]/mL (ref 0.450–4.500)

## 2016-02-24 NOTE — Telephone Encounter (Signed)
I do not know if arginine interacts with any medications.  I would call the manufacturer and get their recommendations.

## 2016-02-24 NOTE — Telephone Encounter (Signed)
LMOM

## 2016-02-24 NOTE — Telephone Encounter (Signed)
Pt states that arginine is to help build muscle when working out.

## 2016-02-24 NOTE — Telephone Encounter (Signed)
I'm not familiar with drug interactions and arginine.  Why is he taking the arginine?

## 2016-02-25 NOTE — Telephone Encounter (Signed)
LMOMLarene Beach not aware of arginine and reactions with medications. Recommends Environmental education officer.

## 2016-02-28 ENCOUNTER — Ambulatory Visit (INDEPENDENT_AMBULATORY_CARE_PROVIDER_SITE_OTHER): Payer: BLUE CROSS/BLUE SHIELD | Admitting: Cardiovascular Disease

## 2016-02-28 ENCOUNTER — Encounter: Payer: Self-pay | Admitting: Cardiovascular Disease

## 2016-02-28 VITALS — BP 126/70 | HR 72 | Ht 71.0 in | Wt 285.5 lb

## 2016-02-28 DIAGNOSIS — R079 Chest pain, unspecified: Secondary | ICD-10-CM | POA: Diagnosis not present

## 2016-02-28 DIAGNOSIS — I517 Cardiomegaly: Secondary | ICD-10-CM

## 2016-02-28 NOTE — Progress Notes (Signed)
Cardiology Office Note   Date:  02/28/2016   ID:  Marc Stevens, DOB 04/27/1961, MRN [id number]  PCP:  Enid Derry, MD  Cardiologist:   Kathlyn Sacramento, MD   Chief Complaint  Patient presents with  . other    C/o right chest pain. Meds reviewed verbally with pt.      History of Present Illness: Marc Stevens is a 55 y.o. male who was referred by Dr. Sanda Klein for evaluation of chest pain.the patient has no previous cardiac history. He has known history of hypertension, hyperlipidemia, obstructive sleep apnea and previous tobacco use. The patient was seen by Kathrynn Humble in March of last year for preoperative cardiovascular evaluation. There was some borderline EKG changes. He underwent a treadmill stress test at that time which was negative for ischemia with hypertensive response to exercise. He had an echocardiogram done in March of this year which showed normal LV systolic function with grade 1 diastolic dysfunction and no navicular pulmonary hypertension. The patient has experienced right sided chest pain described as aching sensation at rest and with certain movement is right shoulder. This has been going on for about 2 months. This pain does not happen with other activities. He has no associated dyspnea. The chest pain is reproducible with right shoulder movement.    Past Medical History  Diagnosis Date  . OSA (obstructive sleep apnea)     cpap - does not know settings   . ED (erectile dysfunction)   . Hyperlipidemia   . HTN (hypertension) 04/18/2012  . Low testosterone   . BPH (benign prostatic hypertrophy)   . Rotator cuff tear     RT  . Torn Achilles tendon 07/11/2015    Past Surgical History  Procedure Laterality Date  . Rotator cuff repair  2007    Left  . Doppler echocardiography  2013  . Left kene arthroscopy     . Lumbar laminectomy/decompression microdiscectomy N/A 01/30/2015    Procedure: MICRO LUMBAR DECOMPRESSION, BILATERAL FORAMINOTOMIES,  MICRODISCECTOMY LUMBAR FOUR TO LUMBAR FIVE;  Surgeon: Susa Day, MD;  Location: [facility];  Service: Orthopedics;  Laterality: N/A;  . Shoulder open rotator cuff repair Right 08/08/2015    Procedure: RIGHT SHOULDER MINI OPEN ROTATOR CUFF REPAIR SUBACROMIAL DECOMPRESSION ;  Surgeon: Susa Day, MD;  Location: [facility];  Service: Orthopedics;  Laterality: Right;     Current Outpatient Prescriptions  Medication Sig Dispense Refill  . amLODipine (NORVASC) 10 MG tablet Take 1 tablet (10 mg total) by mouth every morning. 30 tablet 11  . aspirin EC 81 MG tablet Take 1 tablet (81 mg total) by mouth every morning. Resume 4 days post-op    . carvedilol (COREG) 6.25 MG tablet Take 6.25 mg by mouth 2 (two) times daily.  3  . hydrochlorothiazide (HYDRODIURIL) 12.5 MG tablet Take 12.5 mg by mouth daily.  5  . losartan (COZAAR) 100 MG tablet TAKE 1 TABLET EVERY DAY 90 tablet 3  . ofloxacin (OCUFLOX) 0.3 % ophthalmic solution Place 1 drop into the right eye 3 (three) times daily.     . Omega-3 Fatty Acids (FISH OIL PO) Take by mouth daily. Reported on 10/25/2015    . sildenafil (REVATIO) 20 MG tablet Take 1 tablet (20 mg total) by mouth 5 (five) times daily as needed. Take 2 to 5 tablets 30 minutes prior to intercourse on an empty stomach 50 tablet 11   No current facility-administered medications for this visit.    Allergies:   Review  of patient's allergies indicates no known allergies.    Social History:  The patient  reports that he quit smoking about 11 years ago. His smoking use included Cigarettes. He has a 15 pack-year smoking history. He has never used smokeless tobacco. He reports that he drinks alcohol. He reports that he does not use illicit drugs.   Family History:  The patient's family history includes Cancer in his father; Congestive Heart Failure in his father; Dementia in his mother; Heart disease in his father; Hypertension in his mother. There is no history of Kidney disease, Prostate  cancer, COPD, Diabetes, or Stroke.    ROS:  Please see the history of present illness.   Otherwise, review of systems are positive for none.   All other systems are reviewed and negative.    PHYSICAL EXAM: VS:  BP 126/70 mmHg  Pulse 72  Ht 5\' 11"  (1.803 m)  Wt 285 lb 8 oz (129.502 kg)  BMI 39.84 kg/m2 , BMI Body mass index is 39.84 kg/(m^2). GEN: Well nourished, well developed, in no acute distress HEENT: normal Neck: no JVD, carotid bruits, or masses Cardiac: RRR; no murmurs, rubs, or gallops,no edema  Respiratory:  clear to auscultation bilaterally, normal work of breathing GI: soft, nontender, nondistended, + BS MS: no deformity or atrophy Skin: warm and dry, no rash Neuro:  Strength and sensation are intact Psych: euthymic mood, full affect   EKG:  EKG is ordered today. The ekg ordered today demonstrates normal sinus rhythm with nonspecific T wave changes.   Recent Labs: 12/28/2015: Hemoglobin 16.0; Platelets 327 02/21/2016: ALT 22; BUN 19; Creatinine, Ser 1.06; Potassium 4.1; Sodium 141; TSH 0.845    Lipid Panel    Component Value Date/Time   CHOL 137 02/21/2016 1110   CHOL 139 04/18/2012 0840   TRIG 88 02/21/2016 1110   HDL 48 02/21/2016 1110   HDL 51 04/18/2012 0840   CHOLHDL 2.7 04/18/2012 0840   VLDL 24 04/18/2012 0840   LDLCALC 71 02/21/2016 1110   LDLCALC 64 04/18/2012 0840      Wt Readings from Last 3 Encounters:  02/28/16 285 lb 8 oz (129.502 kg)  02/21/16 284 lb (128.822 kg)  01/01/16 281 lb (127.461 kg)       ASSESSMENT AND PLAN:  1.  Noncardiac chest pain: The patient has right-sided chest pain which seems to be musculoskeletal in etiology. He had a stress test last year which was unremarkable and echocardiogram this year which also did not so significant abnormalities other than mild changes related to hypertension. Thus, I do not recommend any further cardiac evaluation. Continue treatment of risk factors.  2. Essential hypertension: Blood  pressure is controlled on current medications.   Disposition:   FU with me as needed.  Signed,  Kathlyn Sacramento, MD  02/28/2016 1:30 PM    [place]

## 2016-02-28 NOTE — Patient Instructions (Signed)
Medication Instructions: Continue same medications.   Labwork: None.   Procedures/Testing: None.   Follow-Up: As needed with Dr. Jacody Beneke.   Any Additional Special Instructions Will Be Listed Below (If Applicable).     If you need a refill on your cardiac medications before your next appointment, please call your pharmacy.   

## 2016-06-04 ENCOUNTER — Other Ambulatory Visit: Payer: Self-pay

## 2016-06-04 DIAGNOSIS — N400 Enlarged prostate without lower urinary tract symptoms: Secondary | ICD-10-CM

## 2016-06-05 ENCOUNTER — Encounter: Payer: Self-pay | Admitting: Urology

## 2016-06-05 ENCOUNTER — Other Ambulatory Visit: Payer: BLUE CROSS/BLUE SHIELD

## 2016-06-12 ENCOUNTER — Encounter: Payer: Self-pay | Admitting: Urology

## 2016-06-12 ENCOUNTER — Ambulatory Visit (INDEPENDENT_AMBULATORY_CARE_PROVIDER_SITE_OTHER): Payer: BLUE CROSS/BLUE SHIELD | Admitting: Urology

## 2016-06-12 VITALS — BP 127/79 | HR 67 | Ht 71.0 in | Wt 284.8 lb

## 2016-06-12 DIAGNOSIS — N529 Male erectile dysfunction, unspecified: Secondary | ICD-10-CM

## 2016-06-12 DIAGNOSIS — N401 Enlarged prostate with lower urinary tract symptoms: Secondary | ICD-10-CM

## 2016-06-12 DIAGNOSIS — N138 Other obstructive and reflux uropathy: Secondary | ICD-10-CM

## 2016-06-12 NOTE — Progress Notes (Signed)
11:47 AM   Marc Stevens 23-May-1961 [id number]  Referring provider: Arnetha Courser, MD [address] [place], [place] [address]  Chief Complaint  Patient presents with  . Erectile Dysfunction    1 year follow up  . Hypogonadism    HPI: Patient is a 55 year old African-American male with erectile dysfunction, and mild BPH with LUTS who presents today for 1 year follow-up.  His SHIM score is 13, which is mild-moderate ED.  His previous SHIM was 12.  His major complaint is maintaining an erection.     His risk factors for ED are HTN, sleep apnea, BPH, obesity, hyperlipidemia and age.  He denies any painful erections or curvatures with his erections.   He has tried PDE5-inhibitors in the past with success.        SHIM    Row Name 06/12/16 1138         SHIM: Over the last 6 months:   How do you rate your confidence that you could get and keep an erection? Low     When you had erections with sexual stimulation, how often were your erections hard enough for penetration (entering your partner)? A Few Times (much less than half the time)     During sexual intercourse, how often were you able to maintain your erection after you had penetrated (entered) your partner? Very Difficult     During sexual intercourse, how difficult was it to maintain your erection to completion of intercourse? Difficult     When you attempted sexual intercourse, how often was it satisfactory for you? Slightly Difficult       SHIM Total Score   SHIM 13        Score: 1-7 Severe ED 8-11 Moderate ED 12-16 Mild-Moderate ED 17-21 Mild ED 22-25 No ED  BPH WITH LUTS His IPSS score today is 4, which is mild lower urinary tract symptomatology. He is pleased with his quality life due to his urinary symptoms.   His major complaint today is frequency, weak urinary stream, nocturia and straining.  They are not bothersome to him.  He has had these symptoms for several years.  He denies any  dysuria, hematuria or suprapubic pain.  He also denies any recent fevers, chills, nausea or vomiting.  He does not have a family history of PCa.      IPSS    Row Name 06/12/16 1100         International Prostate Symptom Score   How often have you had the sensation of not emptying your bladder? Not at All     How often have you had to urinate less than every two hours? Less than 1 in 5 times     How often have you found you stopped and started again several times when you urinated? Not at All     How often have you found it difficult to postpone urination? Not at All     How often have you had a weak urinary stream? Less than 1 in 5 times     How often have you had to strain to start urination? Less than 1 in 5 times     How many times did you typically get up at night to urinate? 1 Time     Total IPSS Score 4       Quality of Life due to urinary symptoms   If you were to spend the rest of your life with your urinary condition  just the way it is now how would you feel about that? Pleased        Score:  1-7 Mild 8-19 Moderate 20-35 Severe  PMH: Past Medical History:  Diagnosis Date  . BPH (benign prostatic hypertrophy)   . ED (erectile dysfunction)   . HTN (hypertension) 04/18/2012  . Hyperlipidemia   . Low testosterone   . OSA (obstructive sleep apnea)    cpap - does not know settings   . Rotator cuff tear    RT  . Torn Achilles tendon 07/11/2015    Surgical History: Past Surgical History:  Procedure Laterality Date  . DOPPLER ECHOCARDIOGRAPHY  2013  . left kene arthroscopy     . LUMBAR LAMINECTOMY/DECOMPRESSION MICRODISCECTOMY N/A 01/30/2015   Procedure: MICRO LUMBAR DECOMPRESSION, BILATERAL FORAMINOTOMIES, MICRODISCECTOMY LUMBAR FOUR TO LUMBAR FIVE;  Surgeon: Susa Day, MD;  Location: [facility];  Service: Orthopedics;  Laterality: N/A;  . ROTATOR CUFF REPAIR  2007   Left  . SHOULDER OPEN ROTATOR CUFF REPAIR Right 08/08/2015   Procedure: RIGHT SHOULDER MINI OPEN  ROTATOR CUFF REPAIR SUBACROMIAL DECOMPRESSION ;  Surgeon: Susa Day, MD;  Location: [facility];  Service: Orthopedics;  Laterality: Right;    Home Medications:    Medication List       Accurate as of 06/12/16 11:47 AM. Always use your most recent med list.          amLODipine 10 MG tablet Commonly known as:  NORVASC Take 1 tablet (10 mg total) by mouth every morning.   aspirin EC 81 MG tablet Take 1 tablet (81 mg total) by mouth every morning. Resume 4 days post-op   carvedilol 6.25 MG tablet Commonly known as:  COREG Take 6.25 mg by mouth 2 (two) times daily.   FISH OIL PO Take by mouth daily. Reported on 10/25/2015   hydrochlorothiazide 12.5 MG tablet Commonly known as:  HYDRODIURIL Take 12.5 mg by mouth daily.   losartan 100 MG tablet Commonly known as:  COZAAR TAKE 1 TABLET EVERY DAY   ofloxacin 0.3 % ophthalmic solution Commonly known as:  OCUFLOX Place 1 drop into the right eye 3 (three) times daily.   sildenafil 20 MG tablet Commonly known as:  REVATIO Take 1 tablet (20 mg total) by mouth 5 (five) times daily as needed. Take 2 to 5 tablets 30 minutes prior to intercourse on an empty stomach       Allergies: No Known Allergies  Family History: Family History  Problem Relation Age of Onset  . Dementia Mother   . Hypertension Mother   . Cancer Father     colon  . Heart disease Father   . Congestive Heart Failure Father   . Kidney disease Neg Hx   . Prostate cancer Neg Hx   . COPD Neg Hx   . Diabetes Neg Hx   . Stroke Neg Hx     Social History:  reports that he quit smoking about 11 years ago. His smoking use included Cigarettes. He has a 15.00 pack-year smoking history. He has never used smokeless tobacco. He reports that he drinks alcohol. He reports that he does not use drugs.  ROS: UROLOGY Frequent Urination?: No Hard to postpone urination?: No Burning/pain with urination?: No Get up at night to urinate?: No Leakage of urine?: No Urine  stream starts and stops?: No Trouble starting stream?: No Do you have to strain to urinate?: No Blood in urine?: No Urinary tract infection?: No Sexually transmitted disease?: No Injury to  kidneys or bladder?: No Painful intercourse?: No Weak stream?: No Erection problems?: Yes Penile pain?: No  Gastrointestinal Nausea?: No Vomiting?: No Indigestion/heartburn?: No Diarrhea?: No Constipation?: No  Constitutional Fever: No Night sweats?: No Weight loss?: No Fatigue?: No  Skin Skin rash/lesions?: No Itching?: No  Eyes Blurred vision?: No Double vision?: No  Ears/Nose/Throat Sore throat?: No Sinus problems?: No  Hematologic/Lymphatic Swollen glands?: No Easy bruising?: No  Cardiovascular Leg swelling?: No Chest pain?: No  Respiratory Cough?: No Shortness of breath?: No  Endocrine Excessive thirst?: No  Musculoskeletal Back pain?: No Joint pain?: No  Neurological Headaches?: No Dizziness?: No  Psychologic Depression?: No Anxiety?: No  Physical Exam: Blood pressure 127/79, pulse 67, height 5\' 11"  (1.803 m), weight 284 lb 12.8 oz (129.2 kg). Constitutional:  Alert and oriented, No acute distress. HEENT: [place] AT, moist mucus membranes.  Trachea midline, no masses. Cardiovascular: No clubbing, cyanosis, or edema. Respiratory: Normal respiratory effort, no increased work of breathing. GI: Abdomen is soft, nontender, nondistended, no abdominal masses GU: No CVA tenderness. GU: Patient with circumcised phallus.  Urethral meatus is patent.  No penile discharge. No penile lesions or rashes. Scrotum without lesions, cysts, rashes and/or edema.  Testicles are located scrotally bilaterally. No masses are appreciated in the testicles. Left and right epididymis are normal. Rectal: Patient with  normal sphincter tone. Perineum without scarring or rashes. No rectal masses are appreciated. Prostate is approximately 50 ( could not palpate entire due to buttocks tissue)   grams, no nodules are appreciated. Seminal vesicles are normal. Skin: No rashes, bruises or suspicious lesions. Lymph: No cervical or inguinal adenopathy. Neurologic: Grossly intact, no focal deficits, moving all 4 extremities. Psychiatric: Normal mood and affect.   Laboratory Data: Results for orders placed or performed in visit on 02/21/16  Comprehensive metabolic panel  Result Value Ref Range   Glucose 108 (H) 65 - 99 mg/dL   BUN 19 6 - 24 mg/dL   Creatinine, Ser 1.06 0.76 - 1.27 mg/dL   GFR calc non Af Amer 79 >59 mL/min/1.73   GFR calc Af Amer 92 >59 mL/min/1.73   BUN/Creatinine Ratio 18 9 - 20   Sodium 141 134 - 144 mmol/L   Potassium 4.1 3.5 - 5.2 mmol/L   Chloride 103 96 - 106 mmol/L   CO2 26 18 - 29 mmol/L   Calcium 9.5 8.7 - 10.2 mg/dL   Total Protein 6.1 6.0 - 8.5 g/dL   Albumin 4.2 3.5 - 5.5 g/dL   Globulin, Total 1.9 1.5 - 4.5 g/dL   Albumin/Globulin Ratio 2.2 1.2 - 2.2   Bilirubin Total 0.3 0.0 - 1.2 mg/dL   Alkaline Phosphatase 69 39 - 117 IU/L   AST 22 0 - 40 IU/L   ALT 22 0 - 44 IU/L  Lipid Panel w/o Chol/HDL Ratio  Result Value Ref Range   Cholesterol, Total 137 100 - 199 mg/dL   Triglycerides 88 0 - 149 mg/dL   HDL 48 >39 mg/dL   VLDL Cholesterol Cal 18 5 - 40 mg/dL   LDL Calculated 71 0 - 99 mg/dL  TSH  Result Value Ref Range   TSH 0.845 0.450 - 4.500 uIU/mL  PSA  Result Value Ref Range   Prostate Specific Ag, Serum 1.2 0.0 - 4.0 ng/mL  VITAMIN D 25 Hydroxy (Vit-D Deficiency, Fractures)  Result Value Ref Range   Vit D, 25-Hydroxy 28.3 (L) 30.0 - 100.0 ng/mL   Lab Results  Component Value Date   WBC 7.3 12/28/2015  HGB 16.0 12/28/2015   HCT 47.0 12/28/2015   MCV 65.6 (L) 12/28/2015   PLT 327 12/28/2015    Lab Results  Component Value Date   CREATININE 1.06 02/21/2016     Lab Results  Component Value Date   TESTOSTERONE 444 05/14/2015   Assessment & Plan:    1. Erectile dysfunction:  SHIM score is 13.  Continue sildenafil.  RTC in  one year for SHIM score and exam.  2 . BPH with LUTS:   Patient's IPSS score is 4/1.  RTC in one year for IPSS score, PSA and exam. PSA drawn today.     Return in about 1 year (around 06/12/2017) for IPSS, SHIM and exam.  Zara Council, [facility] [address] [place], [place] [address] [phone]

## 2016-06-13 LAB — PSA: PROSTATE SPECIFIC AG, SERUM: 1.4 ng/mL (ref 0.0–4.0)

## 2016-06-23 ENCOUNTER — Telehealth: Payer: Self-pay

## 2016-06-23 NOTE — Telephone Encounter (Signed)
Spoke with pt in reference to PSA results. Pt voiced understanding.  

## 2016-06-23 NOTE — Telephone Encounter (Signed)
-----   Message from Nori Riis, PA-C sent at 06/23/2016 10:11 AM EDT ----- Please notify the patient that his PSA was stable.  We will see him in November.

## 2016-06-24 ENCOUNTER — Other Ambulatory Visit: Payer: Self-pay

## 2016-06-24 MED ORDER — AMLODIPINE BESYLATE 10 MG PO TABS: 10.0000 mg | ORAL_TABLET | Freq: Every morning | ORAL | 11 refills | Status: DC

## 2016-08-28 ENCOUNTER — Ambulatory Visit: Payer: BLUE CROSS/BLUE SHIELD | Admitting: Family Medicine

## 2016-09-04 ENCOUNTER — Encounter: Payer: Self-pay | Admitting: Family Medicine

## 2016-09-04 ENCOUNTER — Telehealth: Payer: Self-pay

## 2016-09-04 ENCOUNTER — Ambulatory Visit (INDEPENDENT_AMBULATORY_CARE_PROVIDER_SITE_OTHER): Payer: BLUE CROSS/BLUE SHIELD | Admitting: Family Medicine

## 2016-09-04 DIAGNOSIS — E782 Mixed hyperlipidemia: Secondary | ICD-10-CM | POA: Diagnosis not present

## 2016-09-04 DIAGNOSIS — I10 Essential (primary) hypertension: Secondary | ICD-10-CM

## 2016-09-04 DIAGNOSIS — G4733 Obstructive sleep apnea (adult) (pediatric): Secondary | ICD-10-CM | POA: Diagnosis not present

## 2016-09-04 DIAGNOSIS — Z5181 Encounter for therapeutic drug level monitoring: Secondary | ICD-10-CM

## 2016-09-04 DIAGNOSIS — R739 Hyperglycemia, unspecified: Secondary | ICD-10-CM

## 2016-09-04 DIAGNOSIS — I517 Cardiomegaly: Secondary | ICD-10-CM | POA: Diagnosis not present

## 2016-09-04 LAB — COMPLETE METABOLIC PANEL WITH GFR
ALBUMIN: 4.2 g/dL (ref 3.6–5.1)
ALK PHOS: 72 U/L (ref 40–115)
ALT: 29 U/L (ref 9–46)
AST: 24 U/L (ref 10–35)
BILIRUBIN TOTAL: 0.4 mg/dL (ref 0.2–1.2)
BUN: 16 mg/dL (ref 7–25)
CO2: 31 mmol/L (ref 20–31)
Calcium: 9.5 mg/dL (ref 8.6–10.3)
Chloride: 104 mmol/L (ref 98–110)
Creat: 1.13 mg/dL (ref 0.70–1.33)
GFR, EST AFRICAN AMERICAN: 85 mL/min (ref >=60)
GFR, EST NON AFRICAN AMERICAN: 73 mL/min (ref >=60)
GLUCOSE: 105 mg/dL — AB (ref 65–99)
POTASSIUM: 4.2 mmol/L (ref 3.5–5.3)
SODIUM: 141 mmol/L (ref 135–146)
TOTAL PROTEIN: 6.4 g/dL (ref 6.1–8.1)

## 2016-09-04 LAB — LIPID PANEL
CHOL/HDL RATIO: 2.9 ratio (ref <5.0)
Cholesterol: 137 mg/dL (ref <200)
HDL: 47 mg/dL (ref >40)
LDL Cholesterol: 75 mg/dL (ref <100)
TRIGLYCERIDES: 75 mg/dL (ref <150)
VLDL: 15 mg/dL (ref <30)

## 2016-09-04 LAB — HEMOGLOBIN A1C
Hgb A1c MFr Bld: 6 % — ABNORMAL HIGH (ref <5.7)
MEAN PLASMA GLUCOSE: 126 mg/dL

## 2016-09-04 MED ORDER — KETOCONAZOLE 2 % EX CREA: 1.0000 "application " | TOPICAL_CREAM | Freq: Two times a day (BID) | CUTANEOUS | 3 refills | Status: DC

## 2016-09-04 NOTE — Telephone Encounter (Signed)
error 

## 2016-09-04 NOTE — Assessment & Plan Note (Signed)
Check liver and kidneys 

## 2016-09-04 NOTE — Progress Notes (Signed)
BP 124/78   Pulse 74   Temp 98.2 F (36.8 C) (Oral)   Resp 14   Ht 5\' 11"  (1.803 m)   Wt 289 lb 9 oz (131.3 kg)   SpO2 95%   BMI 40.39 kg/m    Subjective:    [name], male    DOB: November 04, 1960, 55 y.o.   MRN: [id number]  [name] is a 55 y.o. male  Chief Complaint  Patient presents with  . Follow-up   Patient is here for follow-up  He has hypertension, not controlled today, but was 123/73 at home this morning; most int eh 130s, some in the 140s, lowest in the 110s; highest [id number] systolic; reviewed his numbers with him; seeing nephrologist Watching sodium content in the foods he buys and following No black licorice; used mucinex and Coricidin HBP  He has seen cardiologist, Dr. Fletcher Anon, diagnosed with LVH as well  He is seeing urologist as well; last PSA 1.4 in August; keep on same track  Vitamin D deficiency, 28.3 in May; not outdoors much; did take 2000 iu daily for one month  OSA; wearing CPAP  Remote smoking, quit 2006  Obesity; late night eating, getting up 3-4 am to eat; he left work last night, off work at [employer] am, got in town at 3:30 and did drive-thru thing; typical breakfast is grilled chicken on muffin; wants to substitute french fries and sweet tea; he wants to eliminate those; lettuce tomato, he'll get rid of the cheese  Depression screen [facility] 2/9 09/04/2016 02/21/2016 01/01/2016  Decreased Interest 0 0 0  Down, Depressed, Hopeless 0 0 0  PHQ - 2 Score 0 0 0   Relevant past medical, surgical, family and social history reviewed Past Medical History:  Diagnosis Date  . BPH (benign prostatic hypertrophy)   . ED (erectile dysfunction)   . HTN (hypertension) 04/18/2012  . Hyperlipidemia   . Low testosterone   . Morbidly obese ([place])   . OSA (obstructive sleep apnea)    cpap - does not know settings   . Rotator cuff tear    RT  . Torn Achilles tendon 07/11/2015   Past Surgical History:  Procedure Laterality Date  .  DOPPLER ECHOCARDIOGRAPHY  2013  . left kene arthroscopy     . LUMBAR LAMINECTOMY/DECOMPRESSION MICRODISCECTOMY N/A 01/30/2015   Procedure: MICRO LUMBAR DECOMPRESSION, BILATERAL FORAMINOTOMIES, MICRODISCECTOMY LUMBAR FOUR TO LUMBAR FIVE;  Surgeon: Susa Day, MD;  Location: [facility];  Service: Orthopedics;  Laterality: N/A;  . ROTATOR CUFF REPAIR  2007   Left  . SHOULDER OPEN ROTATOR CUFF REPAIR Right 08/08/2015   Procedure: RIGHT SHOULDER MINI OPEN ROTATOR CUFF REPAIR SUBACROMIAL DECOMPRESSION ;  Surgeon: Susa Day, MD;  Location: [facility];  Service: Orthopedics;  Laterality: Right;  Colonoscopy UTD  Family History  Problem Relation Age of Onset  . Dementia Mother   . Hypertension Mother   . Cancer Father     colon  . Heart disease Father   . Congestive Heart Failure Father   . Kidney disease Neg Hx   . Prostate cancer Neg Hx   . COPD Neg Hx   . Diabetes Neg Hx   . Stroke Neg Hx    Social History  Substance Use Topics  . Smoking status: Former Smoker    Packs/day: 0.50    Years: 30.00    Types: Cigarettes    Quit date: 10/19/2004  . Smokeless tobacco: Never Used  . Alcohol use  0.0 oz/week     Comment: occasional   Interim medical history since last visit reviewed. Allergies and medications reviewed  Review of Systems Per HPI unless specifically indicated above     Objective:    BP 124/78   Pulse 74   Temp 98.2 F (36.8 C) (Oral)   Resp 14   Ht 5\' 11"  (1.803 m)   Wt 289 lb 9 oz (131.3 kg)   SpO2 95%   BMI 40.39 kg/m   Wt Readings from Last 3 Encounters:  09/04/16 289 lb 9 oz (131.3 kg)  06/12/16 284 lb 12.8 oz (129.2 kg)  02/28/16 285 lb 8 oz (129.5 kg)    Today's Vitals   09/04/16 1035 09/04/16 1039 09/04/16 1110  BP: (!) 145/100 (!) 145/90 124/78  Pulse: 74    Resp: 14    Temp: 98.2 F (36.8 C)    TempSrc: Oral    SpO2: 95%    Weight: 289 lb 9 oz (131.3 kg)    Height: 5\' 11"  (1.803 m)      Physical Exam  Constitutional: He appears  well-developed and well-nourished. No distress.  Morbidly obese  HENT:  Head: Normocephalic and atraumatic.  Eyes: EOM are normal. No scleral icterus.  Neck: No thyromegaly present.  Cardiovascular: Normal rate and regular rhythm.   Pulmonary/Chest: Effort normal and breath sounds normal. No respiratory distress. He has no wheezes.  Abdominal: Soft. Bowel sounds are normal. He exhibits no distension.  Musculoskeletal: He exhibits no edema.  Neurological: Coordination normal.  Skin: Skin is warm and dry. No pallor.  Psychiatric: He has a normal mood and affect. His behavior is normal. Judgment and thought content normal.    Results for orders placed or performed in visit on 06/12/16  PSA  Result Value Ref Range   Prostate Specific Ag, Serum 1.4 0.0 - 4.0 ng/mL      Assessment & Plan:   Problem List Items Addressed This Visit      Cardiovascular and Mediastinum   LVH (left ventricular hypertrophy) (Chronic)    Work on weight loss and getting BP down      HTN (hypertension) (Chronic)    Will ask Dr. Juleen China to review pressures and adjust medicine; pt to try DASH guidelines and weight loss        Respiratory   OSA (obstructive sleep apnea) (Chronic)    Continue CPAP        Other   Morbidly obese (H[facility]) (Chronic)    Encouraged weight loss      Medication monitoring encounter    Check liver and kidneys      Relevant Orders   COMPLETE METABOLIC PANEL WITH GFR   Hyperlipidemia (Chronic)    Check lpids today; avoid / limit saturated fats      Relevant Orders   Lipid panel   Hyperglycemia    Monitor glucose and A1c; want to avoid diabetes in the future; weight loss is key      Relevant Orders   Hemoglobin A1c      Follow up plan: Return in about 3 months (around 12/05/2016) for weight, BP.  An after-visit summary was printed and given to the patient at [place].  Please see the patient instructions which may contain other information and recommendations  beyond what is mentioned above in the assessment and plan.  Meds ordered this encounter  Medications  . ketoconazole (NIZORAL) 2 % cream    Sig: Apply 1 application topically 2 (two) times daily.  Dispense:  15 g    Refill:  3    Orders Placed This Encounter  Procedures  . Hemoglobin A1c  . COMPLETE METABOLIC PANEL WITH GFR  . Lipid panel   Send copies of BP readings Cc: [place]

## 2016-09-04 NOTE — Assessment & Plan Note (Signed)
Continue CPAP.  

## 2016-09-04 NOTE — Assessment & Plan Note (Signed)
Work on weight loss and getting BP down

## 2016-09-04 NOTE — Assessment & Plan Note (Signed)
Will ask Dr. Juleen China to review pressures and adjust medicine; pt to try DASH guidelines and weight loss

## 2016-09-04 NOTE — Assessment & Plan Note (Signed)
Check lpids today; avoid / limit saturated fats

## 2016-09-04 NOTE — Assessment & Plan Note (Signed)
Monitor glucose and A1c; want to avoid diabetes in the future; weight loss is key

## 2016-09-04 NOTE — Assessment & Plan Note (Signed)
Encouraged weight loss 

## 2016-09-04 NOTE — Patient Instructions (Addendum)
Try to use PLAIN allergy medicine without the decongestant Avoid: phenylephrine, phenylpropanolamine, and pseudoephredine  Taking 1,000 iu of vitamin D3 daily on average, or 2,000 iu three days a week We'll send your kidney doctor copies of your blood pressure numbers and ask his professional opinion as to whether or not he wants to adjust your medicine Your goal blood pressure is less than 140 mmHg on top. Try to follow the DASH guidelines (DASH stands for Dietary Approaches to Stop Hypertension) Try to limit the sodium in your diet.  Ideally, consume less than 1.5 grams (less than 1,500mg ) per day. Do not add salt when cooking or at the table.  Check the sodium amount on labels when shopping, and choose items lower in sodium when given a choice. Avoid or limit foods that already contain a lot of sodium. Eat a diet rich in fruits and vegetables and whole grains.  Check out the information at familydoctor.org entitled "Nutrition for Weight Loss: What You Need to Know about Fad Diets" Try to lose between 1-2 pounds per week by taking in fewer calories and burning off more calories You can succeed by limiting portions, limiting foods dense in calories and fat, becoming more active, and drinking 8 glasses of water a day (64 ounces) Don't skip meals, especially breakfast, as skipping meals may alter your metabolism Do not use over-the-counter weight loss pills or gimmicks that claim rapid weight loss A healthy BMI (or body mass index) is between 18.5 and 24.9 You can calculate your ideal BMI at the [place] website [employer]fr

## 2016-09-07 ENCOUNTER — Telehealth: Payer: Self-pay

## 2016-09-07 NOTE — Telephone Encounter (Signed)
-----   Message from Arnetha Courser, MD sent at 09/05/2016 11:00 AM EST ----- Please let pt know that his A1c (3 month blood sugar average) is stable; he has prediabetes, so weight loss is so important, as we discussed; his cholesterol panel is excellent; thank you

## 2016-09-07 NOTE — Telephone Encounter (Signed)
Called pt regarding lab results. Went over pt's results.

## 2016-09-08 ENCOUNTER — Telehealth: Payer: Self-pay

## 2016-09-08 ENCOUNTER — Telehealth: Payer: Self-pay | Admitting: Family Medicine

## 2016-09-08 NOTE — Telephone Encounter (Signed)
Patient called about his results. Pt stated he didn't get the results clearly and would like me to go over the reuslts again. I went over the results with him . Pt also ask me about what foods you can eat in order to control his diabetes. I went over some ideal foods. Explain to pt Carbohydrates, which are found in grains, bread, pasta, milk, sweets, fruit, and starchy vegetables, are broken down into glucose in the blood faster than other types of food, which raises blood sugar levels. Protein and fats do not directly impact blood sugar, but both should be consumed in moderation and gave him so food he can eat to help with weight loss. Pt umnderstood

## 2016-09-08 NOTE — Telephone Encounter (Signed)
PT IS ASKING YOU TO CALL HIM AND GIVE HIM THE RESULTS AGAIN TO HIM. HE SAID THAT HE WAS IN A HURRY AND NEEDED YOU TO REPEAT THEM AGAIN. SAYS LEAVE A MESSAGE FOR HIM IF YOU DO NOT GET HIM.

## 2016-10-21 ENCOUNTER — Other Ambulatory Visit: Payer: Self-pay

## 2016-10-21 MED ORDER — LOSARTAN POTASSIUM 100 MG PO TABS: 100.0000 mg | ORAL_TABLET | Freq: Every day | ORAL | 1 refills | Status: DC

## 2016-10-21 MED ORDER — KETOCONAZOLE 2 % EX CREA: 1.0000 "application " | TOPICAL_CREAM | Freq: Two times a day (BID) | CUTANEOUS | 3 refills | Status: DC

## 2016-10-21 NOTE — Telephone Encounter (Signed)
Needs 90 day supply 

## 2016-10-21 NOTE — Telephone Encounter (Signed)
90 day

## 2016-10-21 NOTE — Telephone Encounter (Signed)
Nov 2017 Cr and K+ reviewed Rxs approved

## 2016-11-10 IMAGING — CR DG LUMBAR SPINE 2-3V
2 series · 2 of 2 positions shown · non-contrast
Comparison: None.

CLINICAL DATA: Preoperative examination prior to lumbar surgery

EXAM:
LUMBAR SPINE - 2-3 VIEW

[w l-spine a.p.]
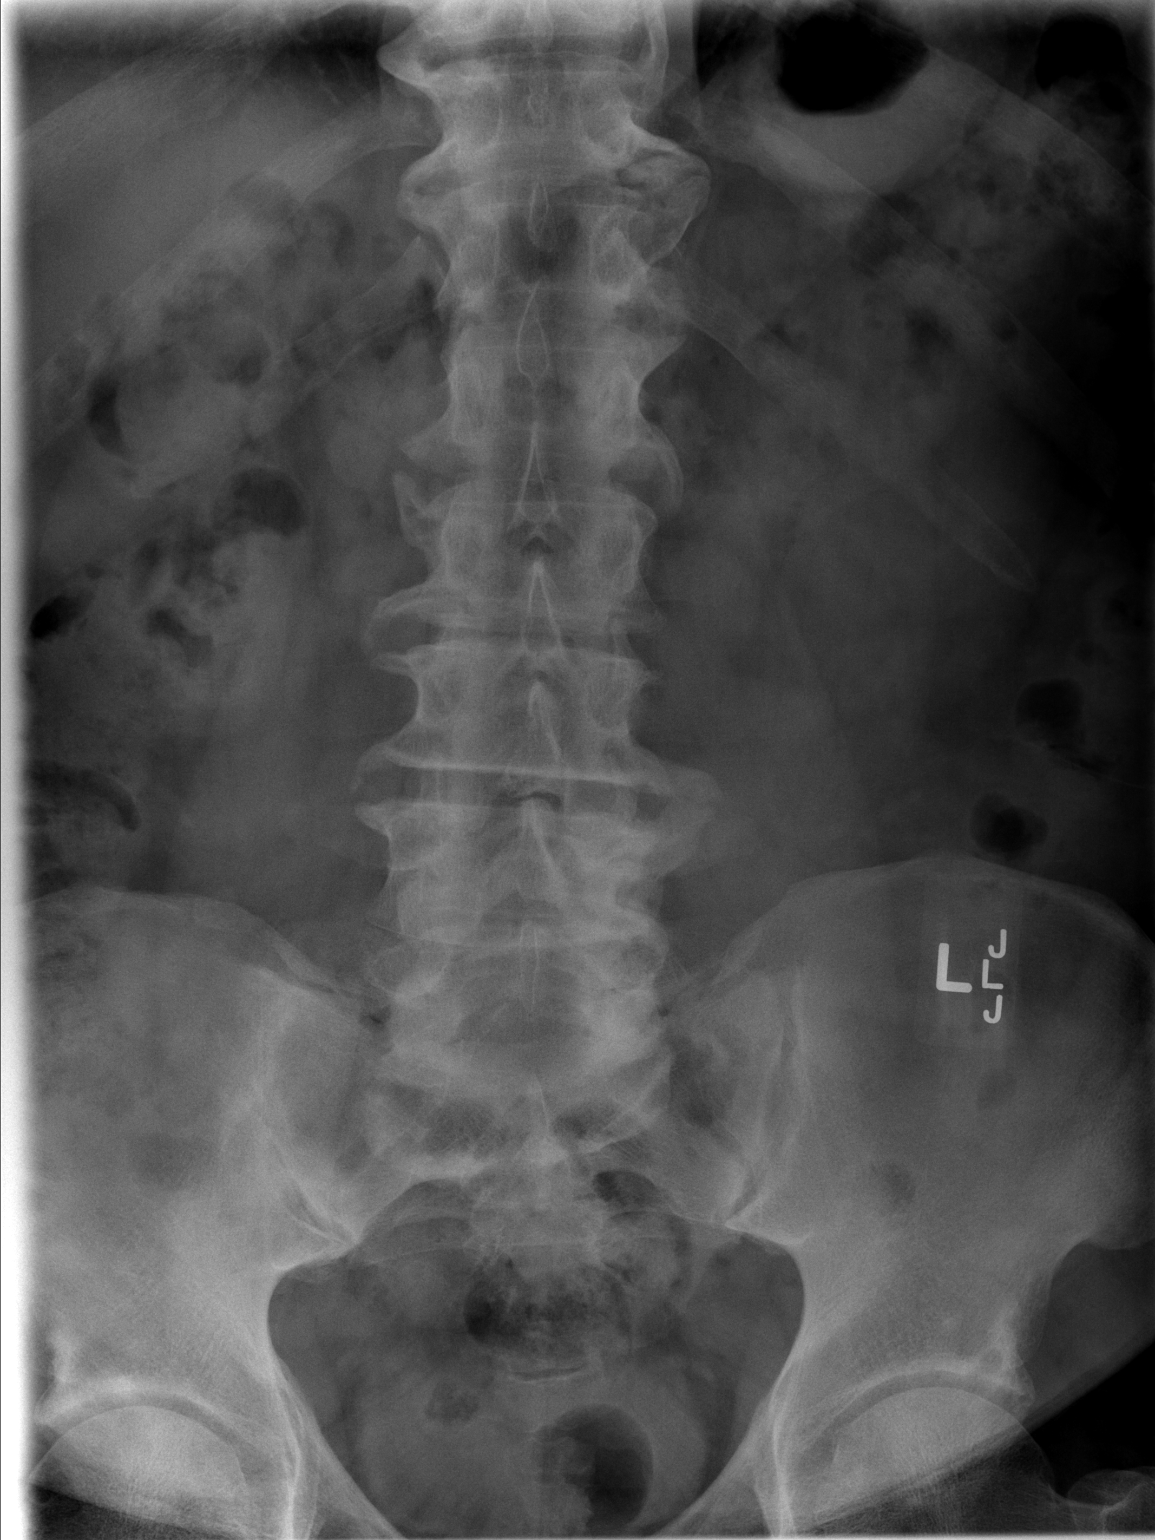

[w l-spine lat]
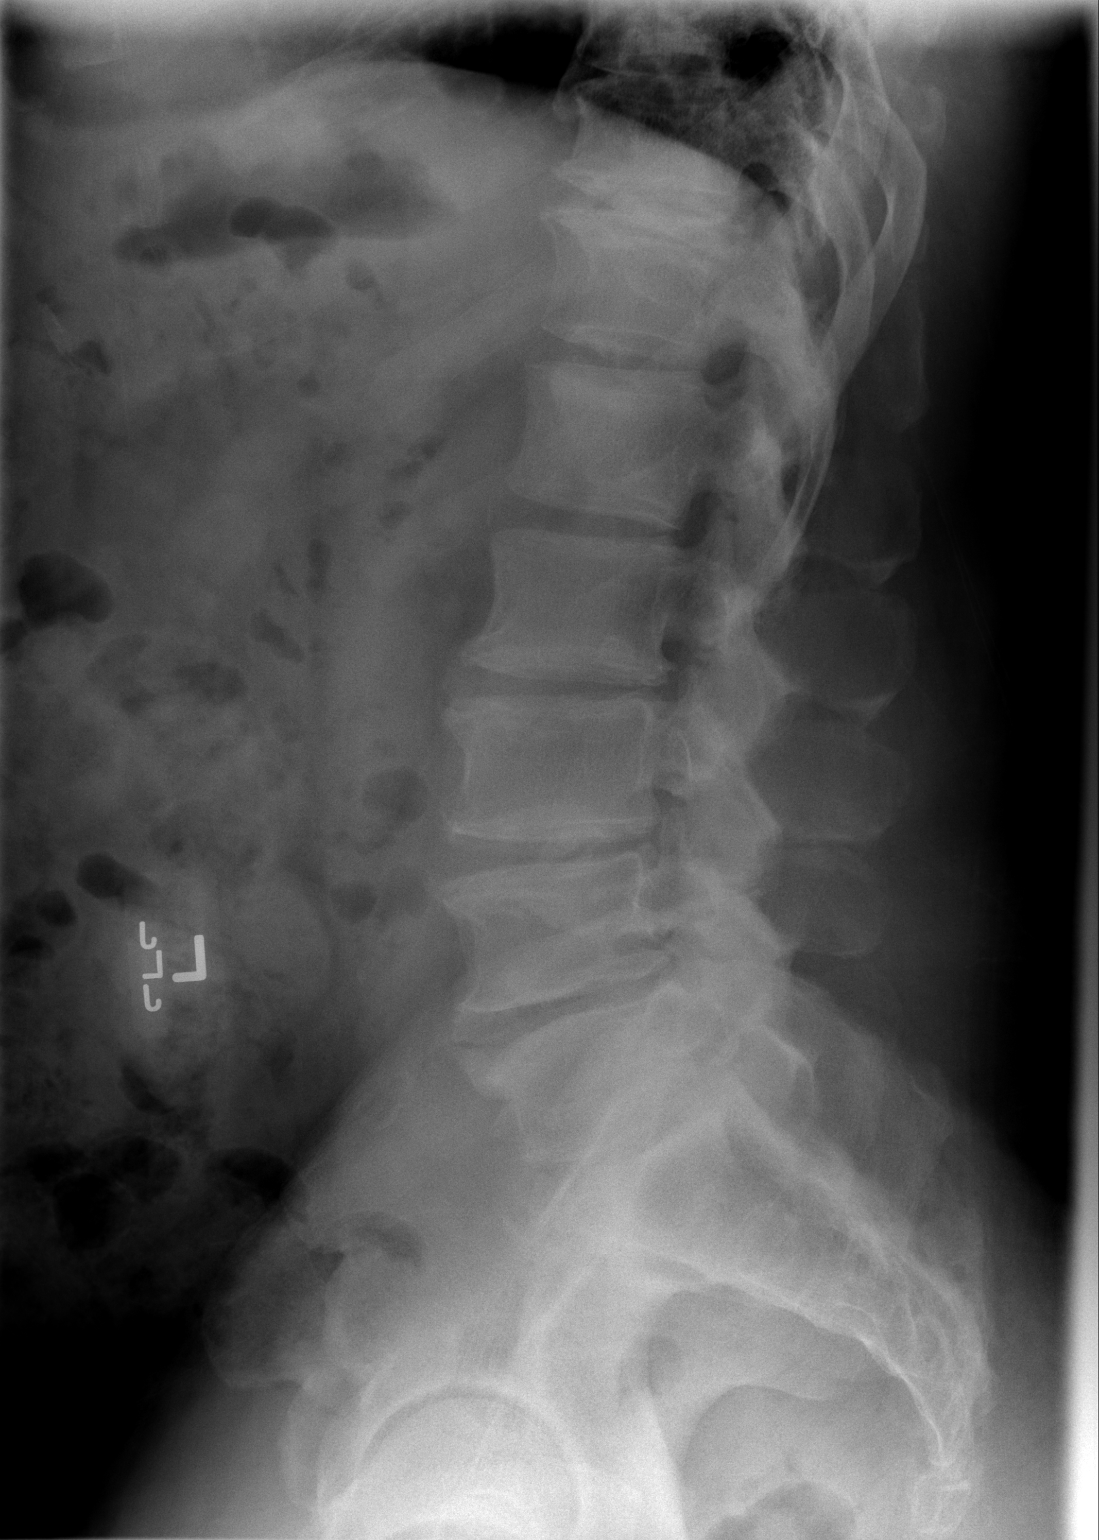

[2 of 2 positions shown; findings below may reference images not displayed]

FINDINGS: The lumbar vertebral bodies are preserved in height. There is
multilevel disc space narrowing most significant at L4-5. There is
no spondylolisthesis. There is small anterior endplate osteophytes
from L2 inferiorly. Large lateral osteophytes are demonstrated from
L1 through L4. There is mild facet joint hypertrophy from L2-3
inferiorly. The observed portions of the sacrum are unremarkable.
IMPRESSION: There is multilevel degenerative disc and facet joint disease. There
is no compression fracture.

## 2016-11-18 IMAGING — DX DG SPINE 1V PORT
1 series · 1 of 1 positions shown · non-contrast
Comparison: Earlier exam at 820 hr today.

CLINICAL DATA: Intraoperative localization.

EXAM:
PORTABLE SPINE - 1 VIEW

[l-spine x-table]
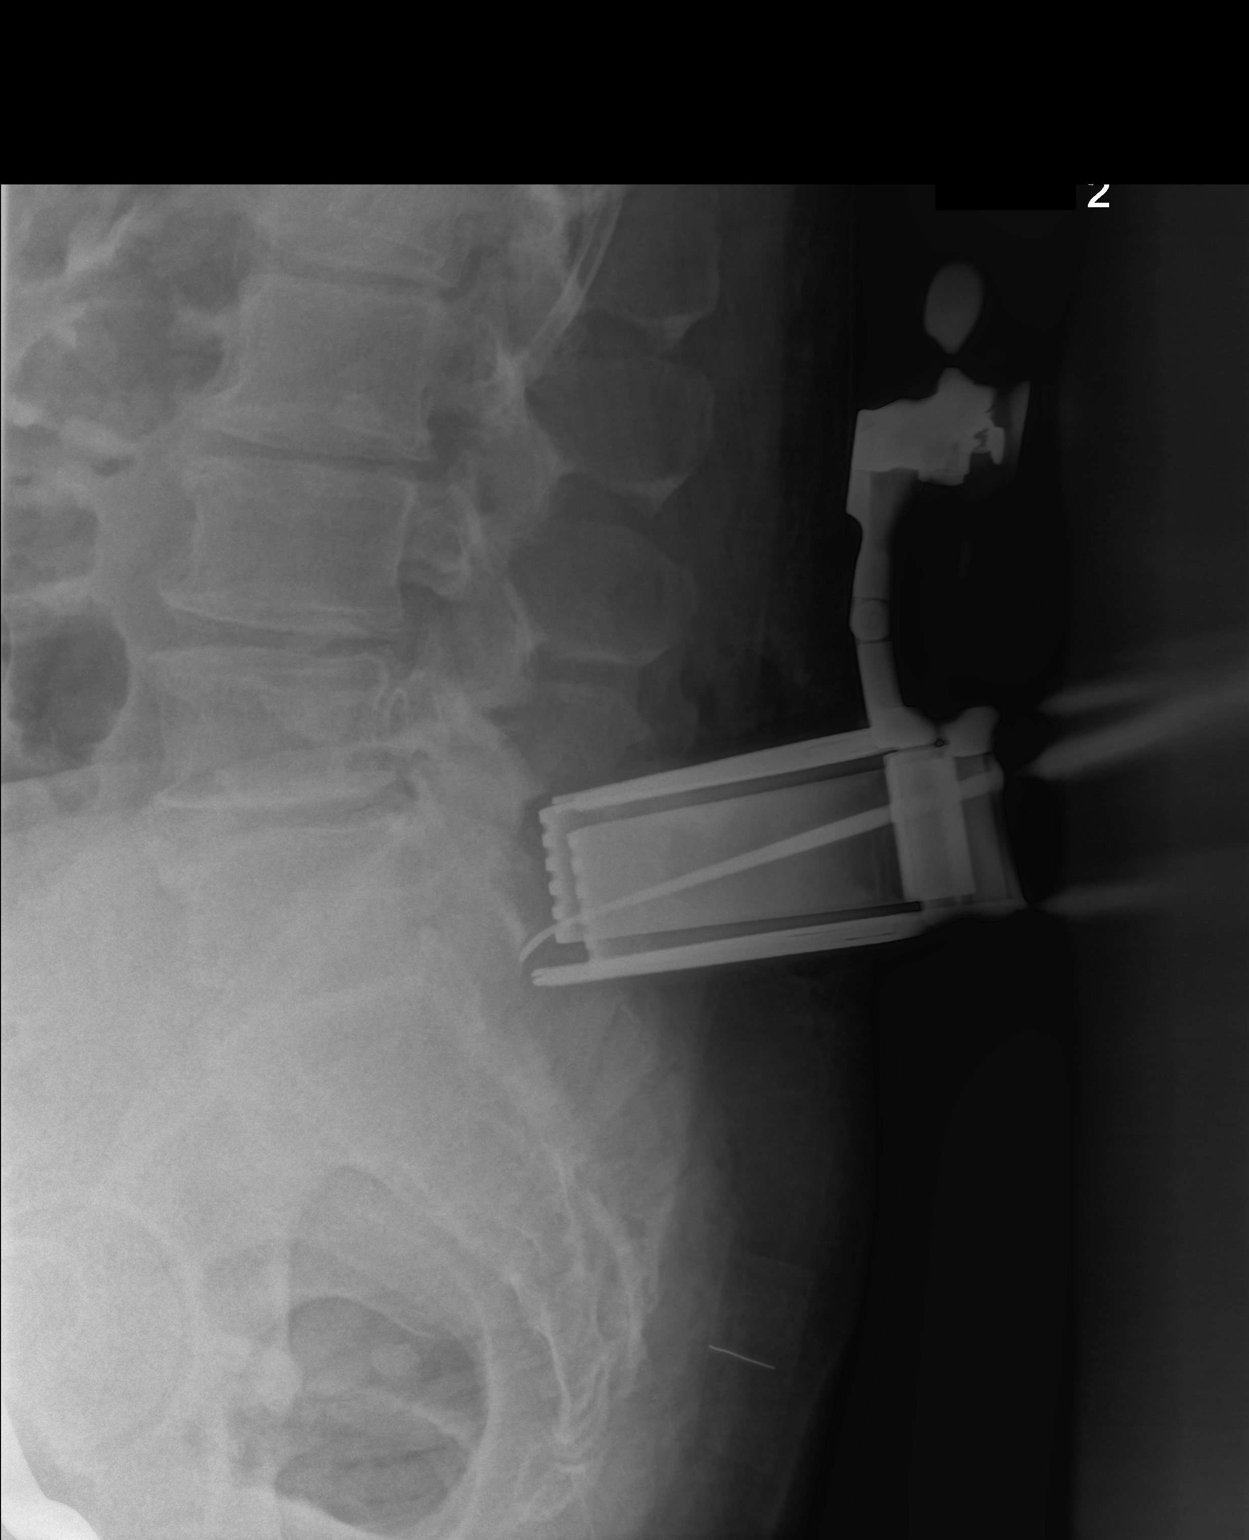

[1 of 1 positions shown; findings below may reference images not displayed]

FINDINGS: Soft tissue retractors are present dorsal to the L5 vertebra. Probe
is present in the L5-S1 interspinous space.
IMPRESSION: Intraoperative localization with probe in the interspinous space at
L5-S1.

## 2016-12-11 ENCOUNTER — Ambulatory Visit (INDEPENDENT_AMBULATORY_CARE_PROVIDER_SITE_OTHER): Payer: BLUE CROSS/BLUE SHIELD | Admitting: Family Medicine

## 2016-12-11 ENCOUNTER — Encounter: Payer: Self-pay | Admitting: Family Medicine

## 2016-12-11 DIAGNOSIS — Z6839 Body mass index (BMI) 39.0-39.9, adult: Secondary | ICD-10-CM | POA: Diagnosis not present

## 2016-12-11 DIAGNOSIS — E6609 Other obesity due to excess calories: Secondary | ICD-10-CM

## 2016-12-11 DIAGNOSIS — Z5181 Encounter for therapeutic drug level monitoring: Secondary | ICD-10-CM

## 2016-12-11 DIAGNOSIS — R739 Hyperglycemia, unspecified: Secondary | ICD-10-CM | POA: Diagnosis not present

## 2016-12-11 DIAGNOSIS — I10 Essential (primary) hypertension: Secondary | ICD-10-CM

## 2016-12-11 NOTE — Progress Notes (Signed)
BP 122/74   Pulse 72   Temp 97.8 F (36.6 C) (Oral)   Resp 16   Wt 284 lb 14.4 oz (129.2 kg)   SpO2 96%   BMI 39.74 kg/m    Subjective:    [name], male    DOB: 1961/02/09, 56 y.o.   MRN: [id number]  [name] is a 56 y.o. male  Chief Complaint  Patient presents with  . Follow-up   Patient is here for f/u He has obesity He has been working on weight loss; got down to 278 pounds Has a walking app on his phone; looked at 12 weeks of walking He is drinking alkaline water, supposed to cleanse out toxins from the body He has diet papers that he wanted me to review; we reviewed these together today He'll shoot for 240 pounds He has hx of elevated glucose, again trying to work on diet and exercise He has hypertension; controlled today; taking multiple medicines for this  Depression screen [facility] 2/9 12/11/2016 09/04/2016 02/21/2016 01/01/2016  Decreased Interest 0 0 0 0  Down, Depressed, Hopeless 0 0 0 0  PHQ - 2 Score 0 0 0 0   Relevant past medical, surgical, family and social history reviewed Past Medical History:  Diagnosis Date  . BPH (benign prostatic hypertrophy)   . ED (erectile dysfunction)   . HTN (hypertension) 04/18/2012  . Hyperlipidemia   . Low testosterone   . Morbidly obese ([place])   . OSA (obstructive sleep apnea)    cpap - does not know settings   . Rotator cuff tear    RT  . Torn Achilles tendon 07/11/2015   Past Surgical History:  Procedure Laterality Date  . DOPPLER ECHOCARDIOGRAPHY  2013  . left kene arthroscopy     . LUMBAR LAMINECTOMY/DECOMPRESSION MICRODISCECTOMY N/A 01/30/2015   Procedure: MICRO LUMBAR DECOMPRESSION, BILATERAL FORAMINOTOMIES, MICRODISCECTOMY LUMBAR FOUR TO LUMBAR FIVE;  Surgeon: Susa Day, MD;  Location: [facility];  Service: Orthopedics;  Laterality: N/A;  . ROTATOR CUFF REPAIR  2007   Left  . SHOULDER OPEN ROTATOR CUFF REPAIR Right 08/08/2015   Procedure: RIGHT SHOULDER MINI OPEN ROTATOR  CUFF REPAIR SUBACROMIAL DECOMPRESSION ;  Surgeon: Susa Day, MD;  Location: [facility];  Service: Orthopedics;  Laterality: Right;   Family History  Problem Relation Age of Onset  . Dementia Mother   . Hypertension Mother   . Cancer Father     colon  . Heart disease Father   . Congestive Heart Failure Father   . Hypertension Sister   . Dementia Maternal Grandmother   . Kidney disease Neg Hx   . Prostate cancer Neg Hx   . COPD Neg Hx   . Diabetes Neg Hx   . Stroke Neg Hx    Social History  Substance Use Topics  . Smoking status: Former Smoker    Packs/day: 0.50    Years: 30.00    Types: Cigarettes    Quit date: 10/19/2004  . Smokeless tobacco: Never Used  . Alcohol use 0.0 oz/week     Comment: occasional    Interim medical history since last visit reviewed. Allergies and medications reviewed  Review of Systems Per HPI unless specifically indicated above     Objective:    BP 122/74   Pulse 72   Temp 97.8 F (36.6 C) (Oral)   Resp 16   Wt 284 lb 14.4 oz (129.2 kg)   SpO2 96%   BMI 39.74 kg/m  Wt Readings from Last 3 Encounters:  12/11/16 284 lb 14.4 oz (129.2 kg)  09/04/16 289 lb 9 oz (131.3 kg)  06/12/16 284 lb 12.8 oz (129.2 kg)    Physical Exam  Constitutional: He appears well-developed and well-nourished. No distress.  Obese; weight down almost 5 pounds over last 3 months  HENT:  Head: Normocephalic and atraumatic.  Eyes: EOM are normal. No scleral icterus.  Neck: No thyromegaly present.  Cardiovascular: Normal rate and regular rhythm.   Pulmonary/Chest: Effort normal and breath sounds normal.  Abdominal: Soft. Bowel sounds are normal. He exhibits no distension.  Musculoskeletal: He exhibits no edema.  Neurological: Coordination normal.  Skin: Skin is warm and dry. No pallor.  Psychiatric: He has a normal mood and affect. His behavior is normal. Judgment and thought content normal.   Results for orders placed or performed in visit on 09/04/16    Hemoglobin A1c  Result Value Ref Range   Hgb A1c MFr Bld 6.0 (H) <5.7 %   Mean Plasma Glucose 126 mg/dL  COMPLETE METABOLIC PANEL WITH GFR  Result Value Ref Range   Sodium 141 135 - 146 mmol/L   Potassium 4.2 3.5 - 5.3 mmol/L   Chloride 104 98 - 110 mmol/L   CO2 31 20 - 31 mmol/L   Glucose, Bld 105 (H) 65 - 99 mg/dL   BUN 16 7 - 25 mg/dL   Creat 1.13 0.70 - 1.33 mg/dL   Total Bilirubin 0.4 0.2 - 1.2 mg/dL   Alkaline Phosphatase 72 40 - 115 U/L   AST 24 10 - 35 U/L   ALT 29 9 - 46 U/L   Total Protein 6.4 6.1 - 8.1 g/dL   Albumin 4.2 3.6 - 5.1 g/dL   Calcium 9.5 8.6 - 10.3 mg/dL   GFR, Est African American 85 >=60 mL/min   GFR, Est Non African American 73 >=60 mL/min  Lipid panel  Result Value Ref Range   Cholesterol 137 <200 mg/dL   Triglycerides 75 <150 mg/dL   HDL 47 >40 mg/dL   Total CHOL/HDL Ratio 2.9 <5.0 Ratio   VLDL 15 <30 mg/dL   LDL Cholesterol 75 <100 mg/dL      Assessment & Plan:   Problem List Items Addressed This Visit      Cardiovascular and Mediastinum   HTN (hypertension) (Chronic)    He'll continue to work on weight loss, healthy eating        Other   Obese    Patient will continue to work on weight loss      Medication monitoring encounter    Check potassium level 7 days into diet; since he is on an ARB, risk of hyperkalemia if eating excessive amounts of potassium-rich foods      Relevant Orders   Basic Metabolic Panel (BMET)   Hyperglycemia    Best thing patient can do to prevent progression to type 2 diabetes is to lose weight; he will continue to work on this          Follow up plan: Return in about 3 months (around 03/10/2017) for blood pressure and weight.  An after-visit summary was printed and given to the patient at [facility].  Please see the patient instructions which may contain other information and recommendations beyond what is mentioned above in the assessment and plan.  No orders of the defined types were placed in  this encounter.   Orders Placed This Encounter  Procedures  . Basic Metabolic Panel (BMET)  Read about alkaline water

## 2016-12-11 NOTE — Patient Instructions (Addendum)
Avoid potassium chloride salt substitutes Good luck Drink plenty of water I'd be glad to check a potassium level on you 7-10 days in to the diet if you'd like to make sure you're not overdoing the potassium

## 2016-12-11 NOTE — Assessment & Plan Note (Addendum)
Check potassium level 7 days into diet; since he is on an ARB, risk of hyperkalemia if eating excessive amounts of potassium-rich foods

## 2016-12-21 NOTE — Assessment & Plan Note (Signed)
Best thing patient can do to prevent progression to type 2 diabetes is to lose weight; he will continue to work on this

## 2016-12-21 NOTE — Assessment & Plan Note (Signed)
Patient will continue to work on weight loss

## 2016-12-21 NOTE — Assessment & Plan Note (Signed)
He'll continue to work on weight loss, healthy eating

## 2017-03-12 ENCOUNTER — Ambulatory Visit (INDEPENDENT_AMBULATORY_CARE_PROVIDER_SITE_OTHER): Payer: BLUE CROSS/BLUE SHIELD | Admitting: Family Medicine

## 2017-03-12 ENCOUNTER — Encounter: Payer: Self-pay | Admitting: Family Medicine

## 2017-03-12 VITALS — BP 118/64 | HR 65 | Temp 98.1°F | Resp 16 | Wt 275.4 lb

## 2017-03-12 DIAGNOSIS — I517 Cardiomegaly: Secondary | ICD-10-CM

## 2017-03-12 DIAGNOSIS — Z5181 Encounter for therapeutic drug level monitoring: Secondary | ICD-10-CM

## 2017-03-12 DIAGNOSIS — E6609 Other obesity due to excess calories: Secondary | ICD-10-CM

## 2017-03-12 DIAGNOSIS — Z6838 Body mass index (BMI) 38.0-38.9, adult: Secondary | ICD-10-CM | POA: Diagnosis not present

## 2017-03-12 DIAGNOSIS — E559 Vitamin D deficiency, unspecified: Secondary | ICD-10-CM | POA: Diagnosis not present

## 2017-03-12 DIAGNOSIS — E782 Mixed hyperlipidemia: Secondary | ICD-10-CM

## 2017-03-12 DIAGNOSIS — R739 Hyperglycemia, unspecified: Secondary | ICD-10-CM

## 2017-03-12 DIAGNOSIS — I10 Essential (primary) hypertension: Secondary | ICD-10-CM | POA: Diagnosis not present

## 2017-03-12 DIAGNOSIS — M25442 Effusion, left hand: Secondary | ICD-10-CM | POA: Diagnosis not present

## 2017-03-12 NOTE — Patient Instructions (Addendum)
Vitamin D3 and take 1,000 iu some days of the week Keep up the great job with eating better and weight loss Look into paraffin hand wax for stiffness and pain

## 2017-03-12 NOTE — Assessment & Plan Note (Signed)
Check labs today.

## 2017-03-12 NOTE — Assessment & Plan Note (Signed)
Patient actively working on weight loss, controlling BP

## 2017-03-12 NOTE — Assessment & Plan Note (Signed)
So pleased with patient's efforts; continue to work on weight loss; discussed measurements like Tanita scale that will help know how much fat or fat % still to lose

## 2017-03-12 NOTE — Assessment & Plan Note (Signed)
Start back taking vitamin D3 1000 iu some days of the week

## 2017-03-12 NOTE — Assessment & Plan Note (Signed)
Check glucose today and A1c; work on weight loss (as he is doing)

## 2017-03-12 NOTE — Assessment & Plan Note (Signed)
Pleased with patient's weight loss and healthier eating; check lipids today

## 2017-03-12 NOTE — Progress Notes (Signed)
BP 118/64   Pulse 65   Temp 98.1 F (36.7 C) (Oral)   Resp 16   Wt 275 lb 6.4 oz (124.9 kg)   SpO2 97%   BMI 38.41 kg/m    Subjective:    [name], male    DOB: Jul 11, 1961, 56 y.o.   MRN: [id number]  [name] is a 56 y.o. male  Chief Complaint  Patient presents with  . Follow-up   HPI Patient is here for f/u Obesity; working on weight loss Grilled chicken for breakfast Grilled chicken and salad for lunch Tries to stay on pointe for dinner; protein for dinner Staying hydrated Walking every morning His next goal is adding in weight-lifting Knee problems right now; he is going to get bicycle to help with quds; mostly right nee, but left knee sometimes; put a brace on there; wears it five hours a day and then off, getting used to it He is measuring his BP Only one over 140 in the last 2 weeks Vit D supplements = no, but will start back Stiffness, can't make a fist with left 2nd MCP; for a while; golf grip affected Prediabetes; father might have had diabetes  Depression screen [facility] 2/9 03/12/2017 12/11/2016 09/04/2016 02/21/2016 01/01/2016  Decreased Interest 0 0 0 0 0  Down, Depressed, Hopeless 0 0 0 0 0  PHQ - 2 Score 0 0 0 0 0   Relevant past medical, surgical, family and social history reviewed Past Medical History:  Diagnosis Date  . BPH (benign prostatic hypertrophy)   . ED (erectile dysfunction)   . HTN (hypertension) 04/18/2012  . Hyperlipidemia   . Low testosterone   . Morbidly obese ([place])   . OSA (obstructive sleep apnea)    cpap - does not know settings   . Rotator cuff tear    RT  . Torn Achilles tendon 07/11/2015   Past Surgical History:  Procedure Laterality Date  . DOPPLER ECHOCARDIOGRAPHY  2013  . left kene arthroscopy     . LUMBAR LAMINECTOMY/DECOMPRESSION MICRODISCECTOMY N/A 01/30/2015   Procedure: MICRO LUMBAR DECOMPRESSION, BILATERAL FORAMINOTOMIES, MICRODISCECTOMY LUMBAR FOUR TO LUMBAR FIVE;  Surgeon:  Susa Day, MD;  Location: [facility];  Service: Orthopedics;  Laterality: N/A;  . ROTATOR CUFF REPAIR  2007   Left  . SHOULDER OPEN ROTATOR CUFF REPAIR Right 08/08/2015   Procedure: RIGHT SHOULDER MINI OPEN ROTATOR CUFF REPAIR SUBACROMIAL DECOMPRESSION ;  Surgeon: Susa Day, MD;  Location: [facility];  Service: Orthopedics;  Laterality: Right;   Family History  Problem Relation Age of Onset  . Dementia Mother   . Hypertension Mother   . Cancer Father        colon  . Heart disease Father   . Congestive Heart Failure Father   . Hypertension Sister   . Dementia Maternal Grandmother   . Kidney disease Neg Hx   . Prostate cancer Neg Hx   . COPD Neg Hx   . Diabetes Neg Hx   . Stroke Neg Hx    Social History   Social History  . Marital status: Single    Spouse name: N/A  . Number of children: N/A  . Years of education: N/A   Occupational History  .  [employer]  .  [employer]   Social History Main Topics  . Smoking status: Former Smoker    Packs/day: 0.50    Years: 30.00    Types: Cigarettes    Quit date: 10/19/2004  .  Smokeless tobacco: Never Used  . Alcohol use 0.0 oz/week     Comment: occasional  . Drug use: No  . Sexual activity: Yes    Birth control/ protection: None   Other Topics Concern  . Not on file   Social History Narrative  . No narrative on file   Interim medical history since last visit reviewed. Allergies and medications reviewed  Review of Systems Per HPI unless specifically indicated above     Objective:    BP 118/64   Pulse 65   Temp 98.1 F (36.7 C) (Oral)   Resp 16   Wt 275 lb 6.4 oz (124.9 kg)   SpO2 97%   BMI 38.41 kg/m   Wt Readings from Last 3 Encounters:  03/12/17 275 lb 6.4 oz (124.9 kg)  12/11/16 284 lb 14.4 oz (129.2 kg)  09/04/16 289 lb 9 oz (131.3 kg)    Physical Exam  Constitutional: He appears well-developed and well-nourished. No distress.  Obese; weight down another 9+ pounds over the last 3 months  HENT:  Head:  Normocephalic and atraumatic.  Eyes: EOM are normal. No scleral icterus.  Neck: No thyromegaly present.  Cardiovascular: Normal rate and regular rhythm.   Pulmonary/Chest: Effort normal and breath sounds normal.  Abdominal: Soft. Bowel sounds are normal. He exhibits no distension.  Musculoskeletal: He exhibits no edema.       Left hand: He exhibits decreased range of motion, tenderness and swelling. He exhibits no deformity.       Hands: Mild swelling, no erythema over the 2nd MCP on the left hand; unable to fully grip 2nd finger left hand  Neurological: Coordination normal.  Skin: Skin is warm and dry. No rash noted. No pallor.  Psychiatric: He has a normal mood and affect. His behavior is normal. Judgment and thought content normal. His mood appears not anxious. He does not exhibit a depressed mood.   Results for orders placed or performed in visit on 09/04/16  Hemoglobin A1c  Result Value Ref Range   Hgb A1c MFr Bld 6.0 (H) <5.7 %   Mean Plasma Glucose 126 mg/dL  COMPLETE METABOLIC PANEL WITH GFR  Result Value Ref Range   Sodium 141 135 - 146 mmol/L   Potassium 4.2 3.5 - 5.3 mmol/L   Chloride 104 98 - 110 mmol/L   CO2 31 20 - 31 mmol/L   Glucose, Bld 105 (H) 65 - 99 mg/dL   BUN 16 7 - 25 mg/dL   Creat 1.13 0.70 - 1.33 mg/dL   Total Bilirubin 0.4 0.2 - 1.2 mg/dL   Alkaline Phosphatase 72 40 - 115 U/L   AST 24 10 - 35 U/L   ALT 29 9 - 46 U/L   Total Protein 6.4 6.1 - 8.1 g/dL   Albumin 4.2 3.6 - 5.1 g/dL   Calcium 9.5 8.6 - 10.3 mg/dL   GFR, Est African American 85 >=60 mL/min   GFR, Est Non African American 73 >=60 mL/min  Lipid panel  Result Value Ref Range   Cholesterol 137 <200 mg/dL   Triglycerides 75 <150 mg/dL   HDL 47 >40 mg/dL   Total CHOL/HDL Ratio 2.9 <5.0 Ratio   VLDL 15 <30 mg/dL   LDL Cholesterol 75 <100 mg/dL      Assessment & Plan:   Problem List Items Addressed This Visit      Cardiovascular and Mediastinum   LVH (left ventricular hypertrophy)  (Chronic)    Patient actively working on weight loss, controlling  BP      HTN (hypertension) (Chronic)    So pleased with how patient is doing with his weight loss and healthier eating; keep up the great job; discussed with him that as he loses more weight, we can start to think about decreasing or taking away one and then two of his BP medicines hopefully; he is monitoring BP at home        Other   Vitamin D deficiency    Start back taking vitamin D3 1000 iu some days of the week      Obese (Chronic)    So pleased with patient's efforts; continue to work on weight loss; discussed measurements like Tanita scale that will help know how much fat or fat % still to lose      Medication monitoring encounter    Check labs today      Relevant Orders   COMPLETE METABOLIC PANEL WITH GFR   Hyperlipidemia (Chronic)    Pleased with patient's weight loss and healthier eating; check lipids today      Relevant Orders   Lipid panel   Hyperglycemia    Check glucose today and A1c; work on weight loss (as he is doing)      Relevant Orders   Hemoglobin A1c    Other Visit Diagnoses    Swelling of joint of left hand    -  Primary   hand xray ordered; doubt gout; may be OA but could be early RA; call if getting worse   Relevant Orders   DG Hand Complete Left       Follow up plan: Return in about 6 months (around 09/12/2017).  An after-visit summary was printed and given to the patient at [facility].  Please see the patient instructions which may contain other information and recommendations beyond what is mentioned above in the assessment and plan.  Meds ordered this encounter  Medications  . DUEXIS 800-26.6 MG TABS    Sig: Take 1 tablet by mouth 3 (three) times daily.    Refill:  0    Orders Placed This Encounter  Procedures  . DG Hand Complete Left  . Lipid panel  . Hemoglobin A1c  . COMPLETE METABOLIC PANEL WITH GFR

## 2017-03-12 NOTE — Assessment & Plan Note (Signed)
So pleased with how patient is doing with his weight loss and healthier eating; keep up the great job; discussed with him that as he loses more weight, we can start to think about decreasing or taking away one and then two of his BP medicines hopefully; he is monitoring BP at home

## 2017-03-13 LAB — COMPLETE METABOLIC PANEL WITH GFR
ALBUMIN: 4 g/dL (ref 3.6–5.1)
ALK PHOS: 70 U/L (ref 40–115)
ALT: 23 U/L (ref 9–46)
AST: 20 U/L (ref 10–35)
BILIRUBIN TOTAL: 0.4 mg/dL (ref 0.2–1.2)
BUN: 15 mg/dL (ref 7–25)
CO2: 25 mmol/L (ref 20–31)
CREATININE: 1.08 mg/dL (ref 0.70–1.33)
Calcium: 9.4 mg/dL (ref 8.6–10.3)
Chloride: 105 mmol/L (ref 98–110)
GFR, Est African American: 89 mL/min (ref >=60)
GFR, Est Non African American: 77 mL/min (ref >=60)
GLUCOSE: 105 mg/dL — AB (ref 65–99)
Potassium: 3.8 mmol/L (ref 3.5–5.3)
SODIUM: 142 mmol/L (ref 135–146)
TOTAL PROTEIN: 6.2 g/dL (ref 6.1–8.1)

## 2017-03-13 LAB — LIPID PANEL
CHOLESTEROL: 121 mg/dL (ref <200)
HDL: 45 mg/dL (ref >40)
LDL Cholesterol: 62 mg/dL (ref <100)
TRIGLYCERIDES: 70 mg/dL (ref <150)
Total CHOL/HDL Ratio: 2.7 Ratio (ref <5.0)
VLDL: 14 mg/dL (ref <30)

## 2017-03-13 LAB — HEMOGLOBIN A1C
Hgb A1c MFr Bld: 5.9 % — ABNORMAL HIGH (ref <5.7)
MEAN PLASMA GLUCOSE: 123 mg/dL

## 2017-04-08 ENCOUNTER — Other Ambulatory Visit: Payer: Self-pay | Admitting: Family Medicine

## 2017-06-10 NOTE — Progress Notes (Signed)
11:02 AM   Marc Stevens 1961-04-02 [id number]  Referring provider: Arnetha Courser, MD [address] [place], [place] [address]  Chief Complaint  Patient presents with  . Benign Prostatic Hypertrophy    1 year follow up  . Erectile Dysfunction    HPI: Patient is a 56 year old African-American male with erectile dysfunction, and mild BPH with LUTS who presents today for 1 year follow-up.  ED His SHIM score is 13, which is mild to moderated ED.  His previous SHIM was 13.  His major complaint is maintaining an erection.     His risk factors for ED are HTN, sleep apnea, BPH, obesity, hyperlipidemia and age.  He denies any painful erections or curvatures with his erections.   He has tried PDE5-inhibitors in the past with success.        SHIM    Row Name 06/11/17 1050         SHIM: Over the last 6 months:   How do you rate your confidence that you could get and keep an erection? Low     When you had erections with sexual stimulation, how often were your erections hard enough for penetration (entering your partner)? Almost Never or Never     During sexual intercourse, how often were you able to maintain your erection after you had penetrated (entered) your partner? Most Times (much more than half the time)     During sexual intercourse, how difficult was it to maintain your erection to completion of intercourse? Slightly Difficult     When you attempted sexual intercourse, how often was it satisfactory for you? A Few Times (much less than half the time)       SHIM Total Score   SHIM 13        Score: 1-7 Severe ED 8-11 Moderate ED 12-16 Mild-Moderate ED 17-21 Mild ED 22-25 No ED  BPH WITH LUTS His IPSS score today is 2, which is mild lower urinary tract symptomatology. He is mostly satisfied with his quality life due to his urinary symptoms.   His previous I PSS score was 4/1.  His major complaint today is frequency, weak urinary stream, nocturia and  straining.  They are not bothersome to him.  He has had these symptoms for several years.  He denies any dysuria, hematuria or suprapubic pain.  He also denies any recent fevers, chills, nausea or vomiting.  He does not have a family history of PCa.      IPSS    Row Name 06/11/17 1000         International Prostate Symptom Score   How often have you had the sensation of not emptying your bladder? Not at All     How often have you had to urinate less than every two hours? Not at All     How often have you found you stopped and started again several times when you urinated? Not at All     How often have you found it difficult to postpone urination? Not at All     How often have you had a weak urinary stream? Less than half the time     How often have you had to strain to start urination? Not at All     How many times did you typically get up at night to urinate? None     Total IPSS Score 2       Quality of Life due to urinary symptoms  If you were to spend the rest of your life with your urinary condition just the way it is now how would you feel about that? Mostly Satisfied        Score:  1-7 Mild 8-19 Moderate 20-35 Severe  PMH: Past Medical History:  Diagnosis Date  . BPH (benign prostatic hypertrophy)   . ED (erectile dysfunction)   . HTN (hypertension) 04/18/2012  . Hyperlipidemia   . Low testosterone   . Morbidly obese ([place])   . OSA (obstructive sleep apnea)    cpap - does not know settings   . Rotator cuff tear    RT  . Torn Achilles tendon 07/11/2015    Surgical History: Past Surgical History:  Procedure Laterality Date  . DOPPLER ECHOCARDIOGRAPHY  2013  . left kene arthroscopy     . LUMBAR LAMINECTOMY/DECOMPRESSION MICRODISCECTOMY N/A 01/30/2015   Procedure: MICRO LUMBAR DECOMPRESSION, BILATERAL FORAMINOTOMIES, MICRODISCECTOMY LUMBAR FOUR TO LUMBAR FIVE;  Surgeon: Susa Day, MD;  Location: [facility];  Service: Orthopedics;  Laterality: N/A;  . right knee  surgery    . ROTATOR CUFF REPAIR  2007   Left  . SHOULDER OPEN ROTATOR CUFF REPAIR Right 08/08/2015   Procedure: RIGHT SHOULDER MINI OPEN ROTATOR CUFF REPAIR SUBACROMIAL DECOMPRESSION ;  Surgeon: Susa Day, MD;  Location: [facility];  Service: Orthopedics;  Laterality: Right;    Home Medications:  Allergies as of 06/11/2017   No Known Allergies     Medication List       Accurate as of 06/11/17 11:02 AM. Always use your most recent med list.          amLODipine 10 MG tablet Commonly known as:  NORVASC TAKE 1 TABLET (10 MG TOTAL) BY MOUTH EVERY MORNING.   aspirin EC 81 MG tablet Take 1 tablet (81 mg total) by mouth every morning. Resume 4 days post-op   carvedilol 6.25 MG tablet Commonly known as:  COREG Take 6.25 mg by mouth 2 (two) times daily.   DUEXIS 800-26.6 MG Tabs Generic drug:  Ibuprofen-Famotidine Take 1 tablet by mouth 3 (three) times daily.   hydrochlorothiazide 12.5 MG tablet Commonly known as:  HYDRODIURIL Take 12.5 mg by mouth daily.   ketoconazole 2 % cream Commonly known as:  NIZORAL Apply 1 application topically 2 (two) times daily.   losartan 100 MG tablet Commonly known as:  COZAAR Take 1 tablet (100 mg total) by mouth daily.   sildenafil 20 MG tablet Commonly known as:  REVATIO Take 1 tablet (20 mg total) by mouth 5 (five) times daily as needed. Take 2 to 5 tablets 30 minutes prior to intercourse on an empty stomach            Discharge Care Instructions        Start     Ordered   06/11/17 0000  sildenafil (REVATIO) 20 MG tablet  5 times daily PRN    Question:  Supervising Provider  Answer:  Hollice Espy   06/11/17 1058      Allergies: No Known Allergies  Family History: Family History  Problem Relation Age of Onset  . Dementia Mother   . Hypertension Mother   . Cancer Father        colon  . Heart disease Father   . Congestive Heart Failure Father   . Hypertension Sister   . Dementia Maternal Grandmother   . Kidney  disease Neg Hx   . Prostate cancer Neg Hx   . COPD Neg Hx   .  Diabetes Neg Hx   . Stroke Neg Hx   . Kidney cancer Neg Hx   . Bladder Cancer Neg Hx     Social History:  reports that he quit smoking about 12 years ago. His smoking use included Cigarettes. He has a 15.00 pack-year smoking history. He has never used smokeless tobacco. He reports that he drinks alcohol. He reports that he does not use drugs.  ROS: UROLOGY Frequent Urination?: No Hard to postpone urination?: No Burning/pain with urination?: No Get up at night to urinate?: No Leakage of urine?: No Urine stream starts and stops?: No Trouble starting stream?: No Do you have to strain to urinate?: No Blood in urine?: No Urinary tract infection?: No Sexually transmitted disease?: No Injury to kidneys or bladder?: No Painful intercourse?: No Weak stream?: No Erection problems?: No Penile pain?: No  Gastrointestinal Nausea?: No Vomiting?: No Indigestion/heartburn?: No Diarrhea?: No Constipation?: No  Constitutional Fever: No Night sweats?: No Weight loss?: No Fatigue?: No  Skin Skin rash/lesions?: No Itching?: No  Eyes Blurred vision?: Yes Double vision?: No  Ears/Nose/Throat Sore throat?: No Sinus problems?: No  Hematologic/Lymphatic Swollen glands?: No Easy bruising?: No  Cardiovascular Leg swelling?: No Chest pain?: No  Respiratory Cough?: No Shortness of breath?: No  Endocrine Excessive thirst?: No  Musculoskeletal Back pain?: No Joint pain?: No  Neurological Headaches?: No Dizziness?: No  Psychologic Depression?: No Anxiety?: No  Physical Exam: Blood pressure (!) 147/77, pulse 76, height 5\' 11"  (1.803 m), weight 291 lb (132 kg). Constitutional:  Alert and oriented, No acute distress. HEENT: [place] AT, moist mucus membranes.  Trachea midline, no masses. Cardiovascular: No clubbing, cyanosis, or edema. Respiratory: Normal respiratory effort, no increased work of breathing. GI:  Abdomen is soft, nontender, nondistended, no abdominal masses GU: No CVA tenderness. GU: Patient with circumcised phallus.  Urethral meatus is patent.  No penile discharge. No penile lesions or rashes. Scrotum without lesions, cysts, rashes and/or edema.  Testicles are located scrotally bilaterally. No masses are appreciated in the testicles. Left and right epididymis are normal. Rectal: Patient with  normal sphincter tone. Perineum without scarring or rashes. No rectal masses are appreciated. Prostate is approximately 50 ( could not palpate entire due to buttocks tissue)  grams, no nodules are appreciated. Seminal vesicles are normal. Skin: No rashes, bruises or suspicious lesions. Lymph: No cervical or inguinal adenopathy. Neurologic: Grossly intact, no focal deficits, moving all 4 extremities. Psychiatric: Normal mood and affect.   Laboratory Data: Results for orders placed or performed in visit on 03/12/17  Lipid panel  Result Value Ref Range   Cholesterol 121 <200 mg/dL   Triglycerides 70 <150 mg/dL   HDL 45 >40 mg/dL   Total CHOL/HDL Ratio 2.7 <5.0 Ratio   VLDL 14 <30 mg/dL   LDL Cholesterol 62 <100 mg/dL  Hemoglobin A1c  Result Value Ref Range   Hgb A1c MFr Bld 5.9 (H) <5.7 %   Mean Plasma Glucose 123 mg/dL  COMPLETE METABOLIC PANEL WITH GFR  Result Value Ref Range   Sodium 142 135 - 146 mmol/L   Potassium 3.8 3.5 - 5.3 mmol/L   Chloride 105 98 - 110 mmol/L   CO2 25 20 - 31 mmol/L   Glucose, Bld 105 (H) 65 - 99 mg/dL   BUN 15 7 - 25 mg/dL   Creat 1.08 0.70 - 1.33 mg/dL   Total Bilirubin 0.4 0.2 - 1.2 mg/dL   Alkaline Phosphatase 70 40 - 115 U/L   AST 20 10 -  35 U/L   ALT 23 9 - 46 U/L   Total Protein 6.2 6.1 - 8.1 g/dL   Albumin 4.0 3.6 - 5.1 g/dL   Calcium 9.4 8.6 - 10.3 mg/dL   GFR, Est African American 89 >=60 mL/min   GFR, Est Non African American 77 >=60 mL/min    Lab Results  Component Value Date   CREATININE 1.08 03/12/2017    I have reviewed the  labs  Assessment & Plan:    1. Erectile dysfunction  - SHIM score is 13, it is stable  - I explained to the patient that in order to achieve an erection it takes good functioning of the nervous system (parasympathetic and rs, sympathetic, sensory and motor), good blood flow into the erectile tissue of the penis and a desire to have sex  - I explained that conditions like diabetes, hypertension, coronary artery disease, peripheral vascular disease, smoking, alcohol consumption, age, sleep apnea and BPH can diminish the ability to have an erection  - we will obtain a serum testosterone level at this time; if it is abnormal we will need to repeat the study for confirmation  - A recent study published in Sex Med 2018 Apr 13 revealed moderate to vigorous aerobic exercise for 40 minutes 4 times per week can decrease erectile problems caused by physical inactivity, obesity, hypertension, metabolic syndrome and/or cardiovascular diseases  - Continue sildenafil  - RTC in 12 bmonths for repeat SHIM score and exam   2 . BPH with LUTS:   Patient's IPSS score is 2/2.  It is stable.  RTC in one year for IPSS score, PSA and exam. PSA drawn today.     Return in about 1 year (around 06/11/2018) for IPSS, SHIM, PSA and exam.  Zara Council, [facility]  [place] [address] [place], [place] [address] [phone]

## 2017-06-11 ENCOUNTER — Other Ambulatory Visit: Payer: Self-pay | Admitting: *Deleted

## 2017-06-11 ENCOUNTER — Other Ambulatory Visit
Admission: RE | Admit: 2017-06-11 | Discharge: 2017-06-11 | Disposition: A | Discharge status: Home / Self Care | Payer: BLUE CROSS/BLUE SHIELD | Source: Ambulatory Visit | Attending: Urology | Admitting: Urology

## 2017-06-11 ENCOUNTER — Encounter: Payer: Self-pay | Admitting: Urology

## 2017-06-11 ENCOUNTER — Ambulatory Visit (INDEPENDENT_AMBULATORY_CARE_PROVIDER_SITE_OTHER): Payer: BLUE CROSS/BLUE SHIELD | Admitting: Urology

## 2017-06-11 VITALS — BP 147/77 | HR 76 | Ht 71.0 in | Wt 291.0 lb

## 2017-06-11 DIAGNOSIS — N400 Enlarged prostate without lower urinary tract symptoms: Secondary | ICD-10-CM | POA: Diagnosis present

## 2017-06-11 DIAGNOSIS — N529 Male erectile dysfunction, unspecified: Secondary | ICD-10-CM

## 2017-06-11 DIAGNOSIS — N138 Other obstructive and reflux uropathy: Secondary | ICD-10-CM

## 2017-06-11 DIAGNOSIS — N401 Enlarged prostate with lower urinary tract symptoms: Secondary | ICD-10-CM | POA: Diagnosis not present

## 2017-06-11 DIAGNOSIS — M25442 Effusion, left hand: Secondary | ICD-10-CM | POA: Diagnosis present

## 2017-06-11 LAB — PSA: PROSTATIC SPECIFIC ANTIGEN: 1.39 ng/mL (ref 0.00–4.00)

## 2017-06-11 MED ORDER — SILDENAFIL CITRATE 20 MG PO TABS: 20.0000 mg | ORAL_TABLET | Freq: Every day | ORAL | 11 refills | Status: DC | PRN

## 2017-06-12 LAB — TESTOSTERONE: Testosterone: 466 ng/dL (ref 264–916)

## 2017-06-14 ENCOUNTER — Telehealth: Payer: Self-pay

## 2017-06-14 NOTE — Telephone Encounter (Signed)
No answer. Mailbox full. 

## 2017-06-14 NOTE — Telephone Encounter (Signed)
-----   Message from Nori Riis, PA-C sent at 06/12/2017 10:26 PM EDT ----- Please let Mr. Hildebran know that his testosterone is normal and his PSA is normal.

## 2017-06-17 NOTE — Telephone Encounter (Signed)
No answer. Mailbox full. 

## 2017-06-18 ENCOUNTER — Ambulatory Visit: Payer: BLUE CROSS/BLUE SHIELD | Admitting: Urology

## 2017-06-18 NOTE — Telephone Encounter (Signed)
No answer. Will send a letter.  

## 2017-06-29 ENCOUNTER — Encounter: Payer: Self-pay | Admitting: Family Medicine

## 2017-06-29 ENCOUNTER — Ambulatory Visit (INDEPENDENT_AMBULATORY_CARE_PROVIDER_SITE_OTHER): Payer: BLUE CROSS/BLUE SHIELD | Admitting: Family Medicine

## 2017-06-29 VITALS — BP 124/76 | HR 82 | Temp 97.9°F | Resp 18 | Ht 71.0 in | Wt 288.4 lb

## 2017-06-29 DIAGNOSIS — I519 Heart disease, unspecified: Secondary | ICD-10-CM | POA: Diagnosis not present

## 2017-06-29 DIAGNOSIS — I10 Essential (primary) hypertension: Secondary | ICD-10-CM

## 2017-06-29 DIAGNOSIS — R600 Localized edema: Secondary | ICD-10-CM

## 2017-06-29 DIAGNOSIS — I517 Cardiomegaly: Secondary | ICD-10-CM | POA: Diagnosis not present

## 2017-06-29 DIAGNOSIS — I5189 Other ill-defined heart diseases: Secondary | ICD-10-CM

## 2017-06-29 LAB — BASIC METABOLIC PANEL WITH GFR
BUN: 21 mg/dL (ref 7–25)
CALCIUM: 9.6 mg/dL (ref 8.6–10.3)
CHLORIDE: 105 mmol/L (ref 98–110)
CO2: 28 mmol/L (ref 20–32)
Creat: 1.12 mg/dL (ref 0.70–1.33)
GFR, Est African American: 85 mL/min/{1.73_m2} (ref >=60)
GFR, Est Non African American: 74 mL/min/{1.73_m2} (ref >=60)
Glucose, Bld: 98 mg/dL (ref 65–99)
POTASSIUM: 3.9 mmol/L (ref 3.5–5.3)
SODIUM: 141 mmol/L (ref 135–146)

## 2017-06-29 NOTE — Progress Notes (Addendum)
Name: Marc Stevens   MRN: [id number]    DOB: Feb 11, 1961   Date:06/29/2017       Progress Note  Subjective  Chief Complaint  Chief Complaint  Patient presents with  . Foot Swelling    HPI  Pt presents with about 4 week history of BLE swelling - he saw Dr. Juleen China on 05/25/2017 for the same and was told to call the office back if he was not improving. He had been seeing Dr. Juleen China for proteinuria. PT has also been seeing the orthopedist for Bilateral knee pain and is currently wearing braces on both knees - completely stopped Duexis after his visit with Dr. Juleen China. No chest pain or shortness of breath, no pain to area, no redness or drainage, no calf pain.  Does not elevate at night and does not wear compression stockings.   Last Echocardiogram was 01/09/2017 - Read by Dr. Chancy Milroy; LV EF was 60%.  Pt has history of LVH, mitral regurgitation, and diastolic function without heart failure.  Patient Active Problem List   Diagnosis Date Noted  . Medication monitoring encounter 09/04/2016  . Hyperglycemia 09/04/2016  . Right-sided chest wall pain 02/21/2016  . Diastolic dysfunction without heart failure 01/17/2016  . Mitral regurgitation 01/17/2016  . LVH (left ventricular hypertrophy) 01/01/2016  . Preventative health care 01/01/2016  . Complete rotator cuff tear 08/08/2015  . Torn Achilles tendon 07/11/2015  . Low testosterone 05/14/2015  . 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 Vitamin D deficiency 04/26/2015  . Beta thalassemia minor 04/26/2015  . Fatigue 04/26/2015  . Obese   . ED (erectile dysfunction)   . Hyperlipidemia   . OSA (obstructive sleep apnea)   . HTN (hypertension) 04/18/2012    Social History  Substance Use Topics  . Smoking status: Former Smoker    Packs/day: 0.50    Years: 30.00    Types: Cigarettes    Quit date: 10/19/2004  . Smokeless tobacco: Never Used  . Alcohol use 0.0 oz/week     Comment: occasional     Current  Outpatient Prescriptions:  .  amLODipine (NORVASC) 10 MG tablet, TAKE 1 TABLET (10 MG TOTAL) BY MOUTH EVERY MORNING., Disp: 30 tablet, Rfl: 11 .  aspirin EC 81 MG tablet, Take 1 tablet (81 mg total) by mouth every morning. Resume 4 days post-op, Disp: , Rfl:  .  carvedilol (COREG) 6.25 MG tablet, Take 6.25 mg by mouth 2 (two) times daily., Disp: , Rfl: 3 .  DUEXIS 800-26.6 MG TABS, Take 1 tablet by mouth 3 (three) times daily., Disp: , Rfl: 0 .  hydrochlorothiazide (HYDRODIURIL) 12.5 MG tablet, Take 12.5 mg by mouth daily., Disp: , Rfl: 5 .  ketoconazole (NIZORAL) 2 % cream, Apply 1 application topically 2 (two) times daily., Disp: 45 g, Rfl: 3 .  losartan (COZAAR) 100 MG tablet, Take 1 tablet (100 mg total) by mouth daily., Disp: 90 tablet, Rfl: 1 .  sildenafil (REVATIO) 20 MG tablet, Take 1 tablet (20 mg total) by mouth 5 (five) times daily as needed. Take 2 to 5 tablets 30 minutes prior to intercourse on an empty stomach, Disp: 50 tablet, Rfl: 11  No Known Allergies  ROS Constitutional: Negative for fever or weight change.  Respiratory: Negative for cough and shortness of breath.   Cardiovascular: Negative for chest pain or palpitations.  Gastrointestinal: Negative for abdominal pain, no bowel changes.  Musculoskeletal: Negative for gait problem or joint swelling.  Skin: Negative for rash.  Neurological: Negative  for dizziness or headache.  No other specific complaints in a complete review of systems (except as listed in HPI above).  Objective  Vitals:   06/29/17 0751  BP: 124/76  Pulse: 82  Resp: 18  Temp: 97.9 F (36.6 C)  TempSrc: Oral  SpO2: 95%  Weight: 288 lb 6.4 oz (130.8 kg)  Height: 5\' 11"  (1.803 m)   Body mass index is 40.22 kg/m.  Nursing Note and Vital Signs reviewed.  Physical Exam  Constitutional: Patient appears well-developed and well-nourished. Obese No distress.  HEENT: head atraumatic, normocephalic Cardiovascular: Normal rate, regular rhythm, S1/S2  present.  No murmur or rub heard. BLE edema is +2, pedal pulses +1, skin is warm/dry/intact.  Pulmonary/Chest: Effort normal and breath sounds clear. No respiratory distress or retractions. Psychiatric: Patient has a normal mood and affect. behavior is normal. Judgment and thought content normal.  Recent Results (from the past 2160 hour(s))  PSA     Status: None   Collection Time: 06/11/17 11:18 AM  Result Value Ref Range   Prostatic Specific Antigen 1.39 0.00 - 4.00 ng/mL    Comment: (NOTE) While PSA levels of <=4.0 ng/ml are reported as reference range, some men with levels below 4.0 ng/ml can have prostate cancer and many men with PSA above 4.0 ng/ml do not have prostate cancer.  Other tests such as free PSA, age specific reference ranges, PSA velocity and PSA doubling time may be helpful especially in men less than 44 years old. Performed at [facility], [place] [address] [place], [place] [address]   Testosterone     Status: None   Collection Time: 06/11/17 11:18 AM  Result Value Ref Range   Testosterone 466 264 - 916 ng/dL    Comment: (NOTE) Adult male reference interval is based on a population of healthy nonobese males (BMI <30) between 46 and 43 years old. [place], [place] [phone]. PMID: [id number]. Performed At: [facility] [place], [place] [id number] Lindon Romp MD NO:[id number]      Assessment & Plan  1. Bilateral edema of lower extremity - B Nat Peptide - BASIC METABOLIC PANEL WITH GFR - Advised patient to call Dr. Assunta Gambles office to schedule follow up regarding BLE edema as per Dr. Assunta Gambles instructions during last visit.  2. Essential hypertension - BASIC METABOLIC PANEL WITH GFR  3. LVH (left ventricular hypertrophy) 4. Diastolic dysfunction without heart failure - If BNP is elevated, we will refer back to cardiology.  -Red flags and when to present for emergency care or RTC including fever >101.40F, chest  pain, shortness of breath, new/worsening/un-resolving symptoms, reviewed with patient at time of visit. Follow up and care instructions discussed and provided in AVS.   I have reviewed this encounter including the documentation in this note and/or discussed this patient with the Johney Maine, FNP, NP-C. I am certifying that I agree with the content of this note as supervising physician.  Steele Sizer, MD [facility] 07/03/2017, 9:01 PM

## 2017-06-30 LAB — BRAIN NATRIURETIC PEPTIDE: Brain Natriuretic Peptide: 4 pg/mL (ref <100)

## 2017-08-13 ENCOUNTER — Ambulatory Visit (INDEPENDENT_AMBULATORY_CARE_PROVIDER_SITE_OTHER): Payer: BLUE CROSS/BLUE SHIELD | Admitting: Family Medicine

## 2017-08-13 ENCOUNTER — Encounter: Payer: Self-pay | Admitting: Family Medicine

## 2017-08-13 VITALS — BP 142/82 | HR 85 | Ht 71.0 in | Wt 296.0 lb

## 2017-08-13 DIAGNOSIS — G4733 Obstructive sleep apnea (adult) (pediatric): Secondary | ICD-10-CM | POA: Diagnosis not present

## 2017-08-13 DIAGNOSIS — I10 Essential (primary) hypertension: Secondary | ICD-10-CM | POA: Diagnosis not present

## 2017-08-13 DIAGNOSIS — R7303 Prediabetes: Secondary | ICD-10-CM

## 2017-08-13 DIAGNOSIS — E559 Vitamin D deficiency, unspecified: Secondary | ICD-10-CM | POA: Diagnosis not present

## 2017-08-13 DIAGNOSIS — Z23 Encounter for immunization: Secondary | ICD-10-CM | POA: Diagnosis not present

## 2017-08-13 DIAGNOSIS — N528 Other male erectile dysfunction: Secondary | ICD-10-CM

## 2017-08-13 DIAGNOSIS — R600 Localized edema: Secondary | ICD-10-CM | POA: Diagnosis not present

## 2017-08-13 DIAGNOSIS — E782 Mixed hyperlipidemia: Secondary | ICD-10-CM

## 2017-08-13 MED ORDER — TETANUS-DIPHTH-ACELL PERTUSSIS 5-2.5-18.5 LF-MCG/0.5 IM SUSP: 0.5000 mL | Freq: Once | INTRAMUSCULAR | Status: DC

## 2017-08-13 NOTE — Assessment & Plan Note (Signed)
Check level and supplement if needed 

## 2017-08-13 NOTE — Assessment & Plan Note (Addendum)
Encouraged weight loss; strategies discussed; refer to nutritionist

## 2017-08-13 NOTE — Assessment & Plan Note (Signed)
Check lipids in late November; weight gain and healthy eating should help; recommended no mor than 3 egg yolks per week

## 2017-08-13 NOTE — Progress Notes (Signed)
BP (!) 142/82 (BP Location: Left Arm, Patient Position: Sitting, Cuff Size: Large)   Pulse 85   Ht 5\' 11"  (1.803 m)   Wt 296 lb (134.3 kg)   SpO2 97%   BMI 41.28 kg/m    Subjective:    [name], male    DOB: 04/22/1961, 56 y.o.   MRN: [id number]  HPI: [name] is a 56 y.o. male  Chief Complaint  Patient presents with  . Follow-up  . Hypertension  . Weight Gain    HPI He is here for f/u of several things The orthopaedist is seeing him for his knees; he is now wearing hinged braces on both knees (sounds like he may have developed genu valgus by his description); I looked in our records under media, Care Everywhere, and the imaging tab and could not find any medical records from orthopaedist so we'll request those today Ortho put him on something 800 mg plus something else and had lots of leg edema Had to buy new sneakers, size 14 from the swelling Had to stop; kidney doctor stopped that He saw Dr. Juleen China within the last 3 months He still has a little residual edema, though  High BP; taking meds split through the day He brought in his readings which show breakdown through the day; mornings tend to be the highest I asked about his sleep apnea; he has the machine; out of a week, he wears it about 4 days; he might fall asleep without putting it on; he tries to get at least 4 hours of sleep; he'll try to do better Trying to stay away from salt  He is working on weight loss, and consulted a body builder friend on what he should eat Morning: 7 eggs, 5 whites and 2 yolks; one cup of oatmeal Then 32 grams of natural peanut butter and four slices of "jazz apple" if needed with working out 10 ounces of chicken and one cup of rice for three meals; rice is mixed white and brown Broccoli too Protein shake meal replacement He is working out five days a week; starts at 4:30 am; might push that back; no chest pain with exercise He is gaining weight; he  quit eating around 7 pm; he might munch on popcorn if he needs something  Prediabetes; no known family members with diabetes; reviewed last A1c Mother had HTN; she died from dementia His father died from kidney failure or CHF High cholesterol; last lipids reviewed Low vitamin D; last level reviewed; 28.3 in May 2017  Depression screen [facility] 2/9 08/13/2017 03/12/2017 12/11/2016 09/04/2016 02/21/2016  Decreased Interest 0 0 0 0 0  Down, Depressed, Hopeless 0 0 0 0 0  PHQ - 2 Score 0 0 0 0 0   Relevant past medical, surgical, family and social history reviewed Past Medical History:  Diagnosis Date  . BPH (benign prostatic hypertrophy)   . ED (erectile dysfunction)   . HTN (hypertension) 04/18/2012  . Hyperlipidemia   . Low testosterone   . Morbidly obese ([place])   . OSA (obstructive sleep apnea)    cpap - does not know settings   . Rotator cuff tear    RT  . Torn Achilles tendon 07/11/2015   Past Surgical History:  Procedure Laterality Date  . DOPPLER ECHOCARDIOGRAPHY  2013  . left kene arthroscopy     . LUMBAR LAMINECTOMY/DECOMPRESSION MICRODISCECTOMY N/A 01/30/2015   Procedure: MICRO LUMBAR DECOMPRESSION, BILATERAL FORAMINOTOMIES, MICRODISCECTOMY LUMBAR FOUR TO LUMBAR FIVE;  Surgeon: Susa Day, MD;  Location: [facility];  Service: Orthopedics;  Laterality: N/A;  . right knee surgery    . ROTATOR CUFF REPAIR  2007   Left  . SHOULDER OPEN ROTATOR CUFF REPAIR Right 08/08/2015   Procedure: RIGHT SHOULDER MINI OPEN ROTATOR CUFF REPAIR SUBACROMIAL DECOMPRESSION ;  Surgeon: Susa Day, MD;  Location: [facility];  Service: Orthopedics;  Laterality: Right;   Family History  Problem Relation Age of Onset  . Dementia Mother   . Hypertension Mother   . Cancer Father        colon  . Heart disease Father   . Congestive Heart Failure Father   . Hypertension Sister   . Dementia Maternal Grandmother   . Kidney disease Neg Hx   . Prostate cancer Neg Hx   . COPD Neg Hx   . Diabetes Neg Hx   .  Stroke Neg Hx   . Kidney cancer Neg Hx   . Bladder Cancer Neg Hx    Social History   Social History  . Marital status: Single    Spouse name: N/A  . Number of children: N/A  . Years of education: N/A   Occupational History  .  [employer]  .  [employer]   Social History Main Topics  . Smoking status: Former Smoker    Packs/day: 0.50    Years: 30.00    Types: Cigarettes    Quit date: 10/19/2004  . Smokeless tobacco: Never Used  . Alcohol use 0.0 oz/week     Comment: occasional  . Drug use: No  . Sexual activity: Yes    Birth control/ protection: None   Other Topics Concern  . Not on file   Social History Narrative  . No narrative on file    Interim medical history since last visit reviewed. Allergies and medications reviewed  Review of Systems Per HPI unless specifically indicated above     Objective:    BP (!) 142/82 (BP Location: Left Arm, Patient Position: Sitting, Cuff Size: Large)   Pulse 85   Ht 5\' 11"  (1.803 m)   Wt 296 lb (134.3 kg)   SpO2 97%   BMI 41.28 kg/m   Wt Readings from Last 3 Encounters:  08/13/17 296 lb (134.3 kg)  06/29/17 288 lb 6.4 oz (130.8 kg)  06/11/17 291 lb (132 kg)    Physical Exam  Constitutional: He appears well-developed and well-nourished. No distress.  Obese; weight gain of 7+ pounds over last 6 weeks  HENT:  Head: Normocephalic and atraumatic.  Eyes: EOM are normal. No scleral icterus.  Neck: No thyromegaly present.  Cardiovascular: Normal rate and regular rhythm.   Pulmonary/Chest: Effort normal and breath sounds normal.  Abdominal: Soft. He exhibits no distension.  Musculoskeletal: He exhibits edema (edema noted distally around ankles, impression from sock elastic evident).  Wearing hinged braces on both knees  Neurological: He is alert.  Skin: Skin is warm and dry. No pallor.  Psychiatric: He has a normal mood and affect. His behavior is normal. Judgment and thought content normal.   Results for orders placed or  performed in visit on 06/29/17  B Nat Peptide  Result Value Ref Range   Brain Natriuretic Peptide 4 <100 pg/mL  BASIC METABOLIC PANEL WITH GFR  Result Value Ref Range   Glucose, Bld 98 65 - 99 mg/dL   BUN 21 7 - 25 mg/dL   Creat 1.12 0.70 - 1.33 mg/dL   GFR, Est Non African American 74 >  OR = 60 mL/min/1.43m2   GFR, Est African American 85 > OR = 60 mL/min/1.60m2   BUN/Creatinine Ratio NOT APPLICABLE 6 - 22 (calc)   Sodium 141 135 - 146 mmol/L   Potassium 3.9 3.5 - 5.3 mmol/L   Chloride 105 98 - 110 mmol/L   CO2 28 20 - 32 mmol/L   Calcium 9.6 8.6 - 10.3 mg/dL      Assessment & Plan:   Problem List Items Addressed This Visit      Cardiovascular and Mediastinum   HTN (hypertension) - Primary (Chronic)    Discussed pressures with patient; not quite to goal; he does not want to increase the HCTZ, sexual side effects; he does not want to add another medicine; he wishes to try weight loss and DASH guidelines; he can monitor at home and contact me if not to goal; continue other medicine        Respiratory   OSA (obstructive sleep apnea) (Chronic)    Encouraged patient to get more sleep, wear the CPAP; that may explain the early morning elevations in her pressure        Genitourinary   ED (erectile dysfunction)    Patient does not wish to increase the thiazide        Other   Vitamin D deficiency    Check level and supplement if needed      Relevant Orders   VITAMIN D 25 Hydroxy (Vit-D Deficiency, Fractures)   Prediabetes    Refer to nutritionist; encouraged cutting back on white bread, rice, flour, sugar recommended; check A1c in late November      Relevant Orders   Amb ref to Medical Nutrition Therapy-MNT   Hemoglobin A1c   Morbid obesity ([place])    Encouraged weight loss; strategies discussed; refer to nutritionist      Hyperlipidemia (Chronic)    Check lipids in late November; weight gain and healthy eating should help; recommended no mor than 3 egg yolks per  week      Relevant Orders   Lipid panel    Other Visit Diagnoses    Need for Tdap vaccination       Relevant Orders   Tdap vaccine greater than or equal to 7yo IM (Completed)   Bilateral leg edema       reviewed last BNP and BMP; doubt heart failure; likely related to hinged braces now; remove for 10 minutes, elevate ankles above hips a few times a day       Follow up plan: Return in about 5 weeks (around 09/16/2017) for fasting labs only; 3 months with Dr. Sanda Klein.  An after-visit summary was printed and given to the patient at [place].  Please see the patient instructions which may contain other information and recommendations beyond what is mentioned above in the assessment and plan.  Meds ordered this encounter  Medications  . DISCONTD: Tdap (BOOSTRIX) injection 0.5 mL    Orders Placed This Encounter  Procedures  . Tdap vaccine greater than or equal to 7yo IM  . VITAMIN D 25 Hydroxy (Vit-D Deficiency, Fractures)  . Lipid panel  . Hemoglobin A1c  . Amb ref to Medical Nutrition Therapy-MNT

## 2017-08-13 NOTE — Assessment & Plan Note (Signed)
Encouraged patient to get more sleep, wear the CPAP; that may explain the early morning elevations in her pressure

## 2017-08-13 NOTE — Patient Instructions (Addendum)
Request records and imaging reports from the orthopaedist in [place] Try to stay under 1500 mg of sodium a day; at the very least, stay under 2000 mg daily Try to limit the egg yolk to no more than 3 per week I'll recommend a daily multivitamin without the extra iron (senior) Consider quinoa as a complete protein Please allow ten minutes to remove the braces a few times during the day, and elevate the ankles above the hips to promote venous return and decrease leg edema Try to follow the DASH guidelines (DASH stands for Dietary Approaches to Stop Hypertension) Try to limit the sodium in your diet.  Ideally, consume less than 1.5 grams (less than 1,500mg ) per day. Do not add salt when cooking or at the table.  Check the sodium amount on labels when shopping, and choose items lower in sodium when given a choice. Avoid or limit foods that already contain a lot of sodium. Eat a diet rich in fruits and vegetables and whole grains. Try to cut back on white rice and simple carbs Try the super greens pasta [place] SuperGreens  is a delicious enriched pasta with 5 green vegetables*: Spinach, Zucchini, Broccoli, Parsley and Kale Return for labs late November

## 2017-08-13 NOTE — Assessment & Plan Note (Signed)
Discussed pressures with patient; not quite to goal; he does not want to increase the HCTZ, sexual side effects; he does not want to add another medicine; he wishes to try weight loss and DASH guidelines; he can monitor at home and contact me if not to goal; continue other medicine

## 2017-08-13 NOTE — Assessment & Plan Note (Signed)
Patient does not wish to increase the thiazide

## 2017-08-13 NOTE — Assessment & Plan Note (Addendum)
Refer to nutritionist; encouraged cutting back on white bread, rice, flour, sugar recommended; check A1c in late November

## 2017-08-22 ENCOUNTER — Other Ambulatory Visit: Payer: Self-pay | Admitting: Family Medicine

## 2017-08-23 NOTE — Telephone Encounter (Signed)
Last K+ and Cr normal; Rx approved

## 2017-08-25 ENCOUNTER — Encounter: Payer: Self-pay | Admitting: Family Medicine

## 2017-08-25 ENCOUNTER — Telehealth: Payer: Self-pay | Admitting: Family Medicine

## 2017-08-25 NOTE — Telephone Encounter (Signed)
He has sent Dr. Patient email.

## 2017-08-25 NOTE — Telephone Encounter (Signed)
Attempted to contact patient related to note for work; unable to leave message on phone; will route to [employer]

## 2017-08-26 ENCOUNTER — Encounter: Payer: Self-pay | Admitting: Family Medicine

## 2017-08-27 ENCOUNTER — Encounter: Payer: Self-pay | Admitting: Dietician

## 2017-08-27 ENCOUNTER — Encounter: Payer: BLUE CROSS/BLUE SHIELD | Attending: Family Medicine | Admitting: Dietician

## 2017-08-27 VITALS — Ht 71.0 in | Wt 295.5 lb

## 2017-08-27 DIAGNOSIS — R7303 Prediabetes: Secondary | ICD-10-CM | POA: Diagnosis not present

## 2017-08-27 DIAGNOSIS — Z6841 Body Mass Index (BMI) 40.0 and over, adult: Secondary | ICD-10-CM

## 2017-08-27 NOTE — Patient Instructions (Addendum)
   Aim to lose 5-10% of your bodyweight within 6 months  Activity goal is 139min / week- start your weight training / cardio plan within the next week - great idea!  Try to incorporate different sources of vegetables, proteins and carbohydrates into your weekly diet to increase variety and help prevent nutritional deficiencies  Cut back portions of meats to 6oz per serving until our next meeting

## 2017-08-27 NOTE — Progress Notes (Signed)
Medical Nutrition Therapy: Visit start time: 0830  end time: 0930  Assessment:  Diagnosis: prediabetes Past medical history: HTN, HLD, Vit D deficiency, sleep apnea, see chart Psychosocial issues/ stress concerns: none Preferred learning method:  Marc Stevens . Hands-on  Current weight: 295.5lb  Height: 5\' 11"  Medications, supplements: HCTZ, Norvasc, Coreg, see chart  Progress and evaluation: Patient's goals are weight reduction and healthy diet changes to prevent diabetes. Would also like to get off of his blood pressure medications. Has been following a meal plan from his friend at the gym for approx 3 months and reports to have gained approx 30# within that time frame. Currently eating every 3 hours and is eating a diet very high in protein, low in variety and low in fruit, vegetables and healthy fats. Uses a food scale to measure out portions of food when eating at home. Eats "clean" during the week and then eats a cheat meal or two on the weekends. "cheat" meals may include fast food or eating out at a restaurant. Follows a low sodium diet d/t HTN. He values having a structured plan to follow.   Physical activity: cardio for 80min on the Max [employer]er 5 days/wk, cut down from 7 days/wk as he plans to start incorporating 3 days of total-body focused weight training into his exercise plan as of next week. Walks at work, sometimes up to 20000 steps/day.  Dietary Intake:  Usual eating pattern includes 5-6 meals per day. Dining out frequency: 17 meals per week.  Breakfast: 7 eggs (egg whites and will have 3 yolks/week) + 1 cup oatmeal, grilled chicken, egg & cheese on a muffin + fries + small amount of sweet tea Snack: 10oz grilled chicken + 1 cup rice Lunch: 10oz grilled chicken + 1 cup rice + 5oz broccoli Snack: apple Supper: 9oz grilled chicken + 1 cup rice or Monster protein + energy drink Snack: 32g PB Beverages: water, 2-4oz of sweet tea, Monster energy drink  Nutrition Care  Education: Topics covered: target goals for weight loss and activity level to prevent diabetes, importance of diet variety, daily protein recommendations vs. Current protein intake, focusing on a sustainable plan rather than needing to incorporate cheat days Basic nutrition: basic food groups, appropriate nutrient balance, appropriate meal and snack schedule, general nutrition guidelines    Weight control: benefits of weight control, determining reasonable weight goal, behavioral changes for weight loss Advanced nutrition: cooking techniques, dining out, food label reading Hypertension:  importance of controlling BP, identifying high sodium foods, how to shop and read nutrition labels to identify low sodium options Hyperlipidemia: healthy and unhealthy fats, role of fiber Other lifestyle changes:  benefits of making changes, increasing motivation, readiness for change, identifying habits that need to change,   Nutritional Diagnosis:  [place]-3.3 Overweight/obesity As related to lifestyle habits.  As evidenced by BMI 41.21. NI-1.7 Predicted excessive energy intake As related to over 30oz meat consumed per day.  As evidenced by patient report of approx. wt gain of 30# x1-37months.  Intervention: Discussion as noted above. Patient will work on including more variety into his diet, consuming less protein and more vegetables until our next meeting in 3 weeks (Nov 30). Overall he should be decreasing the number of daily calories he consumes. Plans to start his new exercise program on Monday. Will provide daily food/ portion suggestions based on weight, height, age and activity level at next session.   Education Materials given:  Marland Kitchen Your Game Plan to Prevent Type II Diabetes . Goals/  instructions  Learner/ who was taught:  . Patient  Level of understanding: Marland Kitchen Verbalizes/ demonstrates competency  Demonstrated degree of understanding via:   Teach back Learning barriers: . None  Willingness to learn/  readiness for change: . Eager, change in progress  Monitoring and Evaluation:  Dietary intake, exercise, and body weight      follow up: in 3 week(s) : September 17, 2017

## 2017-09-17 ENCOUNTER — Encounter: Payer: Self-pay | Admitting: Dietician

## 2017-09-17 ENCOUNTER — Encounter (INDEPENDENT_AMBULATORY_CARE_PROVIDER_SITE_OTHER): Payer: BLUE CROSS/BLUE SHIELD | Admitting: Dietician

## 2017-09-17 VITALS — Wt 305.0 lb

## 2017-09-17 DIAGNOSIS — R7303 Prediabetes: Secondary | ICD-10-CM | POA: Diagnosis not present

## 2017-09-17 NOTE — Progress Notes (Signed)
Medical Nutrition Therapy: Visit start time: 0830  end time: 0900  Assessment:  Diagnosis: prediabetes Medical history changes: none Psychosocial issues/ stress concerns: none  Current weight: 305#  Height: 5\' 11"  Medications, supplement changes: none  Progress and evaluation: Patient reports to have made no changes to his diet or to his exercise routine since our initial meeting 3 weeks ago. He is still following the dietary protocol given to him by his friend, and thus has continued to gain wt. In 3 weeks he has gained 5# and continues to feel lethargic. A structured plan was given to him, and a plan put in place to begin to follow this plan. Gradual changes and education on proper portions will be his focus.  Physical activity: Unchanged; cardio for 14min on the Max [employer]er 5 days/week, total-body weight training each week with ultimate goal of 3 days/week. Walks at work.   Dietary Intake:  Usual eating pattern includes 5-6 meals per day. Dining out frequency: 17 meals per week.  Breakfast: 7 eggs (egg whites and will have 3 yolks/week) + 1 cup oatmeal, grilled chicken, egg & cheese on a muffin + fries + small amount of sweet tea Snack: 10oz grilled chicken + 1 cup rice Lunch: 10oz grilled chicken + 1 cup rice + 5oz broccoli Snack: apple Supper: 9oz grilled chicken + 1 cup rice or Monster protein + energy drink Snack: 32g PB Beverages: water, 2-4oz of sweet tea, Monster energy drink  Nutrition Care Education: Topics covered: *Food Diary for Carbohydrate CountingWeight management guidelineslow sodium, using meal planner as a tool to help learn proper portion sizes and to help integrate new foods into the diet, requirement to adjust calories after a certain time period to continue to encourage weight loss and explanation on how to go about this, making gradual changes to his diet Basic nutrition: basic food groups, appropriate nutrient balance, appropriate meal and snack schedule, general  nutrition guidelines    Weight control: behavioral changes for weight loss Advanced nutrition: dining out, food label reading, carbohydrate counting/ macronutrient counting (grams) Other lifestyle changes:  benefits of making changes, increasing motivation, readiness for change, identifying habits that need to change  Nutritional Diagnosis:  NI-1.7 Predicted excessive energy intake As related to current diet plan.  As evidenced by patient report of steady weight gain over the past 3 months and 5# wt gain in 3 weeks.  Intervention: Discussion as noted above. Patient was provided an individualized daily meal planning guideline with a grocery list and handout to aid in counting carbohydrates in grams (has a food scale at home and often weights his food out in grams). Guidelines were based on a low-sodium, low cholesterol diet. He was instructed on how to adjust calories based on the number on the scale, with the ultimate goal being weight reduction. He was encouraged to follow up with questions in person, via email, or by telephone.  Education Materials given:  Marland Kitchen Carbohydrate counting (g) resource to apply to meal plan . Grocery list . Sample daily meal pattern with individualized macronutrient distributions  Learner/ who was taught:  . Patient  Level of understanding: Marland Kitchen Verbalizes/ demonstrates competency  Demonstrated degree of understanding via:   Teach back Learning barriers: . None  Willingness to learn/ readiness for change: . Acceptance, ready for change  Monitoring and Evaluation:  Dietary intake, exercise, and body weight      follow up: prn. Encouraged to follow up ; email & office phone number provided for additional questions.

## 2017-09-21 ENCOUNTER — Encounter: Payer: Self-pay | Admitting: Family Medicine

## 2017-09-24 ENCOUNTER — Encounter: Payer: Self-pay | Admitting: Dietician

## 2017-09-28 ENCOUNTER — Encounter: Payer: Self-pay | Admitting: Family Medicine

## 2017-10-01 ENCOUNTER — Telehealth: Payer: Self-pay

## 2017-10-01 NOTE — Telephone Encounter (Signed)
I added the requested diagnosis under additional diagnosis. Please check to see if this would help.  Thanks

## 2017-10-01 NOTE — Telephone Encounter (Signed)
-----   Message from Arnetha Courser, MD sent at 09/30/2017  3:01 PM EST ----- Regarding: RE: diagnosis on referral Tina, yes, we'll try to add those on  Yurani Fettes, can you ADD those diagnoses to his referral please? I do not know how to add something after I've already signed it. Thank you!  ----- Message ----- From: Sharlyn Bologna Sent: 09/30/2017   2:49 PM To: Arnetha Courser, MD Subject: diagnosis on referral                          Hi. Your patient's insurance is denying the service of meeting with a dietitian for Medical Nutrition therapy because pre diabetes is a non covered diagnosis. I see in his chart he also has hyperlipidemia, hypertension, and obesity as a diagnosis but was not listed on the referral. Would you consider adding those other diagnosis that pertain to him so we can try to refile the claim so the patient is not responsible for paying for service? Please let me know. Thank you. [place] Clinical Assistant Nutrition and Diabetes Education [phone]

## 2017-10-04 ENCOUNTER — Encounter: Payer: Self-pay | Admitting: Family Medicine

## 2017-10-29 ENCOUNTER — Encounter: Payer: Self-pay | Admitting: Family Medicine

## 2017-11-12 ENCOUNTER — Encounter (HOSPITAL_COMMUNITY): Payer: Self-pay | Admitting: Emergency Medicine

## 2017-11-12 ENCOUNTER — Emergency Department (HOSPITAL_COMMUNITY)
Admission: EM | Admit: 2017-11-12 | Discharge: 2017-11-13 | Disposition: A | Discharge status: Home / Self Care | Payer: BLUE CROSS/BLUE SHIELD | Attending: Emergency Medicine | Admitting: Emergency Medicine

## 2017-11-12 ENCOUNTER — Ambulatory Visit: Payer: BLUE CROSS/BLUE SHIELD | Admitting: Family Medicine

## 2017-11-12 ENCOUNTER — Encounter: Payer: Self-pay | Admitting: Family Medicine

## 2017-11-12 ENCOUNTER — Other Ambulatory Visit: Payer: Self-pay

## 2017-11-12 ENCOUNTER — Emergency Department (HOSPITAL_COMMUNITY): Payer: BLUE CROSS/BLUE SHIELD

## 2017-11-12 DIAGNOSIS — Y9389 Activity, other specified: Secondary | ICD-10-CM | POA: Insufficient documentation

## 2017-11-12 DIAGNOSIS — G4733 Obstructive sleep apnea (adult) (pediatric): Secondary | ICD-10-CM | POA: Diagnosis not present

## 2017-11-12 DIAGNOSIS — S61012A Laceration without foreign body of left thumb without damage to nail, initial encounter: Secondary | ICD-10-CM | POA: Diagnosis not present

## 2017-11-12 DIAGNOSIS — S6992XA Unspecified injury of left wrist, hand and finger(s), initial encounter: Secondary | ICD-10-CM | POA: Diagnosis present

## 2017-11-12 DIAGNOSIS — Z87891 Personal history of nicotine dependence: Secondary | ICD-10-CM | POA: Diagnosis not present

## 2017-11-12 DIAGNOSIS — Z23 Encounter for immunization: Secondary | ICD-10-CM | POA: Insufficient documentation

## 2017-11-12 DIAGNOSIS — Y929 Unspecified place or not applicable: Secondary | ICD-10-CM | POA: Insufficient documentation

## 2017-11-12 DIAGNOSIS — R7303 Prediabetes: Secondary | ICD-10-CM

## 2017-11-12 DIAGNOSIS — Z7982 Long term (current) use of aspirin: Secondary | ICD-10-CM | POA: Diagnosis not present

## 2017-11-12 DIAGNOSIS — I10 Essential (primary) hypertension: Secondary | ICD-10-CM | POA: Diagnosis not present

## 2017-11-12 DIAGNOSIS — W260XXA Contact with knife, initial encounter: Secondary | ICD-10-CM | POA: Insufficient documentation

## 2017-11-12 DIAGNOSIS — Z79899 Other long term (current) drug therapy: Secondary | ICD-10-CM | POA: Insufficient documentation

## 2017-11-12 DIAGNOSIS — E559 Vitamin D deficiency, unspecified: Secondary | ICD-10-CM | POA: Diagnosis not present

## 2017-11-12 DIAGNOSIS — E782 Mixed hyperlipidemia: Secondary | ICD-10-CM

## 2017-11-12 DIAGNOSIS — Y998 Other external cause status: Secondary | ICD-10-CM | POA: Diagnosis not present

## 2017-11-12 MED ORDER — AMLODIPINE BESYLATE 2.5 MG PO TABS: 2.5000 mg | ORAL_TABLET | Freq: Every day | ORAL | 3 refills | Status: DC

## 2017-11-12 NOTE — Assessment & Plan Note (Signed)
Check level today 

## 2017-11-12 NOTE — ED Triage Notes (Signed)
Patient was sharpening knife and ended up cutting his hand. He cut it around the thumb.

## 2017-11-12 NOTE — Assessment & Plan Note (Signed)
So proud of his efforts at exercise and clean eating; as he loses weight, I completely expect his blood pressure, cholesterol, sugar, etc to come down

## 2017-11-12 NOTE — Progress Notes (Signed)
BP 118/68   Pulse 71   Temp 98.4 F (36.9 C) (Oral)   Resp 16   Wt 286 lb 12.8 oz (130.1 kg)   SpO2 99%   BMI 40.00 kg/m    Subjective:    [name], male    DOB: 07/01/1961, 57 y.o.   MRN: [id number]  [name] is a 57 y.o. male  Chief Complaint  Patient presents with  . Follow-up    HPI Patient is here for f/u His blood pressure is amazing today He has been working hard, getting up at 5:30 am, treadmill every morning; eating clean Gets up at 4:30 am, treadmill, regimented diet Using a machine to measure fat and muscle and water mass; has it on his phone Motivated  vitamin D deficiency; taking a multivitamin  Cholesterol; eating three egg whites and two whole eggs; could decrease the yolks  OSA; set at a number; may need to lower pressure in the future  Prediabetes; due for A1c today  Depression screen [facility] 2/9 09/17/2017 08/27/2017 08/13/2017 03/12/2017 12/11/2016  Decreased Interest 0 0 0 0 0  Down, Depressed, Hopeless 0 0 0 0 0  PHQ - 2 Score 0 0 0 0 0    Relevant past medical, surgical, family and social history reviewed Past Medical History:  Diagnosis Date  . BPH (benign prostatic hypertrophy)   . ED (erectile dysfunction)   . HTN (hypertension) 04/18/2012  . Hyperlipidemia   . Low testosterone   . Morbidly obese ([place])   . OSA (obstructive sleep apnea)    cpap - does not know settings   . Rotator cuff tear    RT  . Torn Achilles tendon 07/11/2015   Past Surgical History:  Procedure Laterality Date  . DOPPLER ECHOCARDIOGRAPHY  2013  . left kene arthroscopy     . LUMBAR LAMINECTOMY/DECOMPRESSION MICRODISCECTOMY N/A 01/30/2015   Procedure: MICRO LUMBAR DECOMPRESSION, BILATERAL FORAMINOTOMIES, MICRODISCECTOMY LUMBAR FOUR TO LUMBAR FIVE;  Surgeon: Susa Day, MD;  Location: [facility];  Service: Orthopedics;  Laterality: N/A;  . right knee surgery    . ROTATOR CUFF REPAIR  2007   Left  . SHOULDER OPEN ROTATOR CUFF  REPAIR Right 08/08/2015   Procedure: RIGHT SHOULDER MINI OPEN ROTATOR CUFF REPAIR SUBACROMIAL DECOMPRESSION ;  Surgeon: Susa Day, MD;  Location: [facility];  Service: Orthopedics;  Laterality: Right;   Family History  Problem Relation Age of Onset  . Dementia Mother   . Hypertension Mother   . Cancer Father        colon  . Heart disease Father   . Congestive Heart Failure Father   . Hypertension Sister   . Dementia Maternal Grandmother   . Kidney disease Neg Hx   . Prostate cancer Neg Hx   . COPD Neg Hx   . Diabetes Neg Hx   . Stroke Neg Hx   . Kidney cancer Neg Hx   . Bladder Cancer Neg Hx    Social History   Tobacco Use  . Smoking status: Former Smoker    Packs/day: 0.50    Years: 30.00    Pack years: 15.00    Types: Cigarettes    Last attempt to quit: 10/19/2004    Years since quitting: 13.0  . Smokeless tobacco: Never Used  Substance Use Topics  . Alcohol use: Yes    Alcohol/week: 0.0 oz    Comment: occasional  . Drug use: No    Interim medical history since  last visit reviewed. Allergies and medications reviewed  Review of Systems Per HPI unless specifically indicated above     Objective:    BP 118/68   Pulse 71   Temp 98.4 F (36.9 C) (Oral)   Resp 16   Wt 286 lb 12.8 oz (130.1 kg)   SpO2 99%   BMI 40.00 kg/m   Wt Readings from Last 3 Encounters:  11/12/17 286 lb 12.8 oz (130.1 kg)  09/17/17 (!) 305 lb (138.3 kg)  08/27/17 295 lb 8 oz (134 kg)    Physical Exam  Constitutional: He appears well-developed and well-nourished. No distress.  Obese; weight loss of 19 pounds over 3 months (intentional)  HENT:  Head: Normocephalic and atraumatic.  Eyes: EOM are normal. No scleral icterus.  Neck: No thyromegaly present.  Cardiovascular: Normal rate and regular rhythm.  Pulmonary/Chest: Effort normal and breath sounds normal.  Abdominal: Soft. He exhibits no distension.  Musculoskeletal: He exhibits no edema (no pitting edema).  Neurological: He is  alert.  Skin: Skin is warm and dry. No pallor.  Psychiatric: He has a normal mood and affect. His behavior is normal. Judgment and thought content normal. His mood appears not anxious. He does not exhibit a depressed mood.   Results for orders placed or performed in visit on 06/29/17  B Nat Peptide  Result Value Ref Range   Brain Natriuretic Peptide 4 <100 pg/mL  BASIC METABOLIC PANEL WITH GFR  Result Value Ref Range   Glucose, Bld 98 65 - 99 mg/dL   BUN 21 7 - 25 mg/dL   Creat 1.12 0.70 - 1.33 mg/dL   GFR, Est Non African American 74 > OR = 60 mL/min/1.43m2   GFR, Est African American 85 > OR = 60 mL/min/1.19m2   BUN/Creatinine Ratio NOT APPLICABLE 6 - 22 (calc)   Sodium 141 135 - 146 mmol/L   Potassium 3.9 3.5 - 5.3 mmol/L   Chloride 105 98 - 110 mmol/L   CO2 28 20 - 32 mmol/L   Calcium 9.6 8.6 - 10.3 mg/dL      Assessment & Plan:   Problem List Items Addressed This Visit      Cardiovascular and Mediastinum   HTN (hypertension) (Chronic)    So pleased with how patient is doing in regards to his weight loss and clean eating; reduce the CCB to 2.5 mg; as pressure continues to come down, this will be the first drug I stop      Relevant Medications   amLODipine (NORVASC) 2.5 MG tablet     Respiratory   OSA (obstructive sleep apnea) (Chronic)    Managed elsewhere; as he loses weight, I explained that he may need to talk to the treating physician and have his pressure evaluated and possibly reduced        Other   Hyperlipidemia (Chronic)   Relevant Medications   amLODipine (NORVASC) 2.5 MG tablet   Vitamin D deficiency    Check level today      Prediabetes    Keep up the great job with weight loss; as he loses weight, completely expect his numbers to improve and his chance of developing diabetes to drop      Morbid obesity ([place])    So proud of his efforts at exercise and clean eating; as he loses weight, I completely expect his blood pressure, cholesterol, sugar, etc  to come down          Follow up plan: Return in about 3 months (  around 02/10/2018) for follow-up visit with Dr. Sanda Stevens.  An after-visit summary was printed and given to the patient at [place].  Please see the patient instructions which may contain other information and recommendations beyond what is mentioned above in the assessment and plan.  Meds ordered this encounter  Medications  . amLODipine (NORVASC) 2.5 MG tablet    Sig: Take 1 tablet (2.5 mg total) by mouth daily.    Dispense:  90 tablet    Refill:  3    No orders of the defined types were placed in this encounter.

## 2017-11-12 NOTE — Assessment & Plan Note (Signed)
Keep up the great job with weight loss; as he loses weight, completely expect his numbers to improve and his chance of developing diabetes to drop

## 2017-11-12 NOTE — Patient Instructions (Addendum)
Let's decrease your amlodipine to 2.5 mg daily As your weight and pressure continue to come down, we'll stop that one first Try to decrease egg yolks to no more than 3 per week Try to follow the DASH guidelines (DASH stands for Dietary Approaches to Stop Hypertension). Try to limit the sodium in your diet to no more than 1,500mg  of sodium per day. Certainly try to not exceed 2,000 mg per day at the very most. Do not add salt when cooking or at the table.  Check the sodium amount on labels when shopping, and choose items lower in sodium when given a choice. Avoid or limit foods that already contain a lot of sodium. Eat a diet rich in fruits and vegetables and whole grains, and try to lose weight if overweight or obese

## 2017-11-12 NOTE — Assessment & Plan Note (Signed)
So pleased with how patient is doing in regards to his weight loss and clean eating; reduce the CCB to 2.5 mg; as pressure continues to come down, this will be the first drug I stop

## 2017-11-12 NOTE — Assessment & Plan Note (Signed)
Managed elsewhere; as he loses weight, I explained that he may need to talk to the treating physician and have his pressure evaluated and possibly reduced

## 2017-11-13 LAB — HEMOGLOBIN A1C
Hgb A1c MFr Bld: 6.1 % of total Hgb — ABNORMAL HIGH (ref <5.7)
Mean Plasma Glucose: 128 (calc)
eAG (mmol/L): 7.1 (calc)

## 2017-11-13 LAB — LIPID PANEL
Cholesterol: 115 mg/dL (ref <200)
HDL: 49 mg/dL (ref >40)
LDL CHOLESTEROL (CALC): 52 mg/dL
NON-HDL CHOLESTEROL (CALC): 66 mg/dL (ref <130)
Total CHOL/HDL Ratio: 2.3 (calc) (ref <5.0)
Triglycerides: 59 mg/dL (ref <150)

## 2017-11-13 LAB — VITAMIN D 25 HYDROXY (VIT D DEFICIENCY, FRACTURES): VIT D 25 HYDROXY: 22 ng/mL — AB (ref 30–100)

## 2017-11-13 MED ORDER — TETANUS-DIPHTH-ACELL PERTUSSIS 5-2.5-18.5 LF-MCG/0.5 IM SUSP
0.5000 mL | Freq: Once | INTRAMUSCULAR | Status: AC
  Administered 2017-11-13: 0.5 mL via INTRAMUSCULAR
  Filled 2017-11-13: qty 0.5

## 2017-11-13 MED ORDER — BACITRACIN ZINC 500 UNIT/GM EX OINT: 1.0000 "application " | TOPICAL_OINTMENT | Freq: Two times a day (BID) | CUTANEOUS | 0 refills | Status: DC

## 2017-11-13 MED ORDER — LIDOCAINE-EPINEPHRINE (PF) 2 %-1:200000 IJ SOLN
10.0000 mL | Freq: Once | INTRAMUSCULAR | Status: AC
  Administered 2017-11-13: 10 mL
  Filled 2017-11-13: qty 20

## 2017-11-13 MED ORDER — IBUPROFEN 600 MG PO TABS: 600.0000 mg | ORAL_TABLET | Freq: Four times a day (QID) | ORAL | 0 refills | Status: DC | PRN

## 2017-11-13 NOTE — ED Provider Notes (Signed)
[place] DEPT Provider Note   CSN: [id number] Arrival date & time: 11/12/17  2227     History   Chief Complaint Chief Complaint  Patient presents with  . Extremity Laceration    HPI Marc Stevens is a 57 y.o. male.  57 year old male presents to the emergency department for evaluation of a laceration to his left thumb.  Symptoms occurred 1.5 hours prior to arrival while patient was sharpening a clean knife.  He reports tenderness at laceration site which is worse with palpation.  No medications taken prior to arrival.  He denies any numbness or tingling.  Last tetanus unknown.     Past Medical History:  Diagnosis Date  . BPH (benign prostatic hypertrophy)   . ED (erectile dysfunction)   . HTN (hypertension) 04/18/2012  . Hyperlipidemia   . Low testosterone   . Morbidly obese ([place])   . OSA (obstructive sleep apnea)    cpap - does not know settings   . Rotator cuff tear    RT  . Torn Achilles tendon 07/11/2015    Patient Active Problem List   Diagnosis Date Noted  . Prediabetes 08/13/2017  . Medication monitoring encounter 09/04/2016  . Hyperglycemia 09/04/2016  . Right-sided chest wall pain 02/21/2016  . Diastolic dysfunction without heart failure 01/17/2016  . Mitral regurgitation 01/17/2016  . LVH (left ventricular hypertrophy) 01/01/2016  . Preventative health care 01/01/2016  . Complete rotator cuff tear 08/08/2015  . Torn Achilles tendon 07/11/2015  . Low testosterone 05/14/2015  . 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 Vitamin D deficiency 04/26/2015  . Beta thalassemia minor 04/26/2015  . Morbid obesity ([place])   . ED (erectile dysfunction)   . Hyperlipidemia   . OSA (obstructive sleep apnea)   . HTN (hypertension) 04/18/2012    Past Surgical History:  Procedure Laterality Date  . DOPPLER ECHOCARDIOGRAPHY  2013  . left kene arthroscopy     . LUMBAR LAMINECTOMY/DECOMPRESSION  MICRODISCECTOMY N/A 01/30/2015   Procedure: MICRO LUMBAR DECOMPRESSION, BILATERAL FORAMINOTOMIES, MICRODISCECTOMY LUMBAR FOUR TO LUMBAR FIVE;  Surgeon: Susa Day, MD;  Location: [facility];  Service: Orthopedics;  Laterality: N/A;  . right knee surgery    . ROTATOR CUFF REPAIR  2007   Left  . SHOULDER OPEN ROTATOR CUFF REPAIR Right 08/08/2015   Procedure: RIGHT SHOULDER MINI OPEN ROTATOR CUFF REPAIR SUBACROMIAL DECOMPRESSION ;  Surgeon: Susa Day, MD;  Location: [facility];  Service: Orthopedics;  Laterality: Right;       Home Medications    Prior to Admission medications   Medication Sig Start Date End Date Taking? Authorizing Provider  amLODipine (NORVASC) 2.5 MG tablet Take 1 tablet (2.5 mg total) by mouth daily. 11/12/17   Arnetha Courser, MD  aspirin EC 81 MG tablet Take 1 tablet (81 mg total) by mouth every morning. Resume 4 days post-op 02/01/15   Lacie Draft M, PA-C  bacitracin ointment Apply 1 application topically 2 (two) times daily. 11/13/17   Antonietta Breach, PA-C  carvedilol (COREG) 6.25 MG tablet Take 6.25 mg by mouth 2 (two) times daily. 11/29/15   [provider]  hydrochlorothiazide (HYDRODIURIL) 12.5 MG tablet Take 12.5 mg by mouth daily. 07/19/15   [provider]  ibuprofen (ADVIL,MOTRIN) 600 MG tablet Take 1 tablet (600 mg total) by mouth every 6 (six) hours as needed. 11/13/17   Antonietta Breach, PA-C  ketoconazole (NIZORAL) 2 % cream Apply 1 application topically 2 (two) times daily. Patient taking differently:  Apply 1 application topically as needed.  10/21/16   Lada, Satira Anis, MD  losartan (COZAAR) 100 MG tablet TAKE 1 TABLET (100 MG TOTAL) BY MOUTH DAILY. 08/23/17   Arnetha Courser, MD  Multiple Vitamin (MULTIVITAMIN) tablet Take 1 tablet by mouth daily.    [provider]  sildenafil (REVATIO) 20 MG tablet Take 1 tablet (20 mg total) by mouth 5 (five) times daily as needed. Take 2 to 5 tablets 30 minutes prior to intercourse on an empty stomach  06/11/17   Nori Riis, PA-C    Family History Family History  Problem Relation Age of Onset  . Dementia Mother   . Hypertension Mother   . Cancer Father        colon  . Heart disease Father   . Congestive Heart Failure Father   . Hypertension Sister   . Dementia Maternal Grandmother   . Kidney disease Neg Hx   . Prostate cancer Neg Hx   . COPD Neg Hx   . Diabetes Neg Hx   . Stroke Neg Hx   . Kidney cancer Neg Hx   . Bladder Cancer Neg Hx     Social History Social History   Tobacco Use  . Smoking status: Former Smoker    Packs/day: 0.50    Years: 30.00    Pack years: 15.00    Types: Cigarettes    Last attempt to quit: 10/19/2004    Years since quitting: 13.0  . Smokeless tobacco: Never Used  Substance Use Topics  . Alcohol use: Yes    Alcohol/week: 0.0 oz    Comment: occasional  . Drug use: No     Allergies   Patient has no known allergies.   Review of Systems Review of Systems Ten systems reviewed and are negative for acute change, except as noted in the HPI.    Physical Exam Updated Vital Signs BP 118/65 (BP Location: Right Arm)   Pulse 81   Temp 98.3 F (36.8 C) (Oral)   Resp 18   Ht 5\' 11"  (1.803 m)   Wt 129.7 kg (286 lb)   SpO2 95%   BMI 39.89 kg/m   Physical Exam  Constitutional: He is oriented to person, place, and time. He appears well-developed and well-nourished. No distress.  Nontoxic appearing and in no acute distress  HENT:  Head: Normocephalic and atraumatic.  Eyes: Conjunctivae and EOM are normal. No scleral icterus.  Neck: Normal range of motion.  Cardiovascular: Normal rate, regular rhythm and intact distal pulses.  Distal radial pulse 2+ in the left upper extremity.  Capillary refill brisk in all digits of the left hand.  Pulmonary/Chest: Effort normal. No respiratory distress.  Musculoskeletal: Normal range of motion.       Left hand: He exhibits tenderness and laceration. He exhibits normal range of motion, no bony  tenderness and no swelling. Normal sensation noted. Normal strength noted.  Neurological: He is alert and oriented to person, place, and time.  Skin: Skin is warm and dry. No rash noted. He is not diaphoretic. No erythema. No pallor.  4 cm skin laceration to the left thenar eminence.  Bleeding controlled with pressure.  No palpable, pulsatile bleeding.  Psychiatric: He has a normal mood and affect. His behavior is normal.  Nursing note and vitals reviewed.    ED Treatments / Results  Labs (all labs ordered are listed, but only abnormal results are displayed) Labs Reviewed - No data to display  EKG  EKG  Interpretation None       Radiology Dg Hand Complete Left  Result Date: 11/13/2017 CLINICAL DATA:  Initial evaluation for acute trauma, laceration at left thumb. EXAM: LEFT HAND - COMPLETE 3+ VIEW COMPARISON:  None. FINDINGS: Bandaging material overlies the left hand. Known soft tissue laceration not well seen. No radiopaque foreign body. No acute fracture or dislocation. 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 IMPRESSION: 1. Soft tissue laceration at the level of the left thumb. No radiopaque foreign body. 2. No acute osseous abnormality. Electronically Signed   By: Jeannine Boga M.D.   On: 11/13/2017 00:06    Procedures Procedures (including critical care time)  LACERATION REPAIR Performed by: Antonietta Breach Authorized by: Antonietta Breach Consent: Verbal consent obtained. Risks and benefits: risks, benefits and alternatives were discussed Consent given by: patient Patient identity confirmed: provided demographic data Prepped and Draped in normal sterile fashion Wound explored  Laceration Location: L thumb, thenar eminence  Laceration Length: 4cm  No Foreign Bodies seen or palpated  Anesthesia: local infiltration  Local anesthetic: lidocaine 2% with epinephrine  Anesthetic total: 4 ml  Irrigation method: syringe Amount of cleaning:  standard  Skin closure: 4-0 ethilon  Number of sutures: 5  Technique: simple interrupted  Patient tolerance: Patient tolerated the procedure well with no immediate complications.   Medications Ordered in ED Medications  lidocaine-EPINEPHrine (XYLOCAINE W/EPI) 2 %-1:200000 (PF) injection 10 mL (10 mLs Infiltration Given 11/13/17 0137)  Tdap (BOOSTRIX) injection 0.5 mL (0.5 mLs Intramuscular Given 11/13/17 0135)     Initial Impression / Assessment and Plan / ED Course  I have reviewed the triage vital signs and the nursing notes.  Pertinent labs & imaging results that were available during my care of the patient were reviewed by me and considered in my medical decision making (see chart for details).     Tdap booster given. Laceration occurred < 8 hours prior to repair which was well tolerated. Patient has no comorbidities to effect normal wound healing. Discussed suture home care with patient and answered questions. Pt to follow up for wound check and suture removal in 14 days. Return precautions discussed and provided. Patient discharged in stable condition with no unaddressed concerns.   Final Clinical Impressions(s) / ED Diagnoses   Final diagnoses:  Thumb laceration, left, initial encounter    ED Discharge Orders        Ordered    bacitracin ointment  2 times daily     11/13/17 0254    ibuprofen (ADVIL,MOTRIN) 600 MG tablet  Every 6 hours PRN     11/13/17 0254       Antonietta Breach, PA-C 11/13/17 [place], April, MD 11/13/17 0814

## 2017-11-15 ENCOUNTER — Encounter: Payer: Self-pay | Admitting: Family Medicine

## 2017-11-16 NOTE — Telephone Encounter (Signed)
Please help him with either returning to the ER for this (that should be covered as part of the first bill) or he can see Raquel Sarna here, but there would be a charge for suture removal Thank you

## 2017-11-22 ENCOUNTER — Telehealth: Payer: Self-pay | Admitting: Family Medicine

## 2017-11-22 NOTE — Telephone Encounter (Signed)
Called pt, no answer. Unable to leave message as voicemail is full. Will send mychart message.

## 2017-11-22 NOTE — Telephone Encounter (Signed)
I received a fax from [place] They suggest that I start patient on metformin given his prediabetes and obesity I also know though that he is working hard on this on his own Does he want to start the metformin or stay the course on his own? Thank you

## 2017-11-23 ENCOUNTER — Encounter: Payer: Self-pay | Admitting: Family Medicine

## 2017-11-23 MED ORDER — METFORMIN HCL ER 500 MG PO TB24: 500.0000 mg | ORAL_TABLET | Freq: Every day | ORAL | 0 refills | Status: DC

## 2017-11-26 ENCOUNTER — Ambulatory Visit: Payer: BLUE CROSS/BLUE SHIELD | Admitting: Family Medicine

## 2017-11-26 ENCOUNTER — Encounter: Payer: Self-pay | Admitting: Family Medicine

## 2017-11-26 VITALS — BP 112/68 | HR 81 | Temp 98.0°F | Resp 18 | Ht 71.0 in | Wt 287.7 lb

## 2017-11-26 DIAGNOSIS — Z4802 Encounter for removal of sutures: Secondary | ICD-10-CM

## 2017-11-26 DIAGNOSIS — S61012A Laceration without foreign body of left thumb without damage to nail, initial encounter: Secondary | ICD-10-CM | POA: Insufficient documentation

## 2017-11-26 DIAGNOSIS — S61012D Laceration without foreign body of left thumb without damage to nail, subsequent encounter: Secondary | ICD-10-CM | POA: Diagnosis not present

## 2017-11-26 NOTE — Patient Instructions (Signed)
Suture Removal, Care After Refer to this sheet in the next few weeks. These instructions provide you with information on caring for yourself after your procedure. Your health care provider may also give you more specific instructions. Your treatment has been planned according to current medical practices, but problems sometimes occur. Call your health care provider if you have any problems or questions after your procedure. What can I expect after the procedure? After your stitches (sutures) are removed, it is typical to have the following:  Some discomfort and swelling in the wound area.  Slight redness in the area.  Follow these instructions at home:  If you have skin adhesive strips over the wound area, do not take the strips off. They will fall off on their own in a few days. If the strips remain in place after 14 days, you may remove them.  Change any bandages (dressings) at least once a day or as directed by your health care provider. If the bandage sticks, soak it off with warm, soapy water.  Apply cream or ointment only as directed by your health care provider. If using cream or ointment, wash the area with soap and water 2 times a day to remove all the cream or ointment. Rinse off the soap and pat the area dry with a clean towel.  Keep the wound area dry and clean. If the bandage becomes wet or dirty, or if it develops a bad smell, change it as soon as possible.  Continue to protect the wound from injury.  Use sunscreen when out in the sun. New scars become sunburned easily. Contact a health care provider if:  You have increasing redness, swelling, or pain in the wound.  You see pus coming from the wound.  You have a fever.  You notice a bad smell coming from the wound or dressing.  Your wound breaks open (edges not staying together). This information is not intended to replace advice given to you by your health care provider. Make sure you discuss any questions you have  with your health care provider. Document Released: 06/30/2001 Document Revised: 03/12/2016 Document Reviewed: 05/17/2013 Elsevier Interactive Patient Education  2017 [employer]  

## 2017-11-26 NOTE — Progress Notes (Signed)
Name: Marc Stevens   MRN: [id number]    DOB: 07-Oct-1961   Date:11/26/2017       Progress Note  Subjective  Chief Complaint  Chief Complaint  Patient presents with  . Suture / Staple Removal    from left hand, seen in ER,  . Numbness    now in thumb    HPI  PT presents for future removal of sutures to the LEFT thumb s/p knife injury.  5 Sutures are in place.  No pain or tenderness, no swelling, no drainage or bleeding.  Has kept area clean and dry.  Does have some residual numbness to the left lateral area of the thumb.  Has good AROM without motion restriction.  Patient Active Problem List   Diagnosis Date Noted  . Prediabetes 08/13/2017  . Medication monitoring encounter 09/04/2016  . Hyperglycemia 09/04/2016  . Right-sided chest wall pain 02/21/2016  . Diastolic dysfunction without heart failure 01/17/2016  . Mitral regurgitation 01/17/2016  . LVH (left ventricular hypertrophy) 01/01/2016  . Preventative health care 01/01/2016  . Complete rotator cuff tear 08/08/2015  . Torn Achilles tendon 07/11/2015  . Low testosterone 05/14/2015  . 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 Vitamin D deficiency 04/26/2015  . Beta thalassemia minor 04/26/2015  . Morbid obesity ([place])   . ED (erectile dysfunction)   . Hyperlipidemia   . OSA (obstructive sleep apnea)   . HTN (hypertension) 04/18/2012    Social History   Tobacco Use  . Smoking status: Former Smoker    Packs/day: 0.50    Years: 30.00    Pack years: 15.00    Types: Cigarettes    Last attempt to quit: 10/19/2004    Years since quitting: 13.1  . Smokeless tobacco: Never Used  Substance Use Topics  . Alcohol use: Yes    Alcohol/week: 0.0 oz    Comment: occasional    Current Outpatient Medications:  .  amLODipine (NORVASC) 2.5 MG tablet, Take 1 tablet (2.5 mg total) by mouth daily., Disp: 90 tablet, Rfl: 3 .  aspirin EC 81 MG tablet, Take 1 tablet (81 mg total) by mouth every morning.  Resume 4 days post-op, Disp: , Rfl:  .  bacitracin ointment, Apply 1 application topically 2 (two) times daily., Disp: 15 g, Rfl: 0 .  carvedilol (COREG) 6.25 MG tablet, Take 6.25 mg by mouth 2 (two) times daily., Disp: , Rfl: 3 .  hydrochlorothiazide (HYDRODIURIL) 12.5 MG tablet, Take 12.5 mg by mouth daily., Disp: , Rfl: 5 .  ibuprofen (ADVIL,MOTRIN) 600 MG tablet, Take 1 tablet (600 mg total) by mouth every 6 (six) hours as needed., Disp: 30 tablet, Rfl: 0 .  ketoconazole (NIZORAL) 2 % cream, Apply 1 application topically 2 (two) times daily. (Patient taking differently: Apply 1 application topically as needed. ), Disp: 45 g, Rfl: 3 .  losartan (COZAAR) 100 MG tablet, TAKE 1 TABLET (100 MG TOTAL) BY MOUTH DAILY., Disp: 90 tablet, Rfl: 3 .  metFORMIN (GLUCOPHAGE XR) 500 MG 24 hr tablet, Take 1 tablet (500 mg total) by mouth daily with breakfast. For one week, then two a day, Disp: 53 tablet, Rfl: 0 .  Multiple Vitamin (MULTIVITAMIN) tablet, Take 1 tablet by mouth daily., Disp: , Rfl:  .  sildenafil (REVATIO) 20 MG tablet, Take 1 tablet (20 mg total) by mouth 5 (five) times daily as needed. Take 2 to 5 tablets 30 minutes prior to intercourse on an empty stomach, Disp: 50 tablet, Rfl: 11  No Known Allergies  ROS Ten systems reviewed and is negative except as mentioned in HPI  Objective  Vitals:   11/26/17 0836  BP: 112/68  Pulse: 81  Resp: 18  Temp: 98 F (36.7 C)  TempSrc: Oral  SpO2: 96%  Weight: 287 lb 11.2 oz (130.5 kg)  Height: 5\' 11"  (1.803 m)   Body mass index is 40.13 kg/m.  Nursing Note and Vital Signs reviewed.  Physical Exam  Musculoskeletal:       Hands:   Constitutional: Patient appears well-developed and well-nourished. Obese. No distress.  Cardiovascular: Normal rate, regular rhythm, S1/S2 present.  No murmur or rub heard. No BLE edema. Pulmonary/Chest: Effort normal and breath sounds clear. No respiratory distress or retractions. Skin: 5 Sutures are removed  from LEFT thumb - area is clean and dry, steri-strip is applied to single area along the wound line as marked above - no bleeding, no exudate, no erythema, no swelling.  No results found for this or any previous visit (from the past 72 hour(s)).  Assessment & Plan  1. Laceration of left thumb without foreign body without damage to nail, subsequent encounter 2. Encounter for removal of sutures 5 sutures removed from left thumb.  2 steri-strips applied. Advised to follow up with PCP if numbness persists or if any mobility restriction occurs, explained that these can be residual effects from a laceration, however he needs to monitor symptoms and RTC to consider hand surgeon referral if needed.  -Red flags and when to present for emergency care or RTC including fever >101.20F, opening of skin along laceration, bleeding, purulent exudate, swelling or redness at the injury, new/worsening/un-resolving symptoms, reviewed with patient at time of visit. Follow up and care instructions discussed and provided in AVS.

## 2017-12-02 ENCOUNTER — Ambulatory Visit: Payer: BLUE CROSS/BLUE SHIELD | Admitting: Family Medicine

## 2017-12-07 ENCOUNTER — Encounter: Payer: Self-pay | Admitting: Family Medicine

## 2017-12-07 ENCOUNTER — Ambulatory Visit: Payer: BLUE CROSS/BLUE SHIELD | Admitting: Family Medicine

## 2017-12-07 VITALS — BP 118/72 | HR 69 | Temp 98.3°F | Resp 16 | Ht 71.0 in | Wt 289.5 lb

## 2017-12-07 DIAGNOSIS — R200 Anesthesia of skin: Secondary | ICD-10-CM | POA: Diagnosis not present

## 2017-12-07 DIAGNOSIS — M79645 Pain in left finger(s): Secondary | ICD-10-CM

## 2017-12-07 DIAGNOSIS — M19049 Primary osteoarthritis, unspecified hand: Secondary | ICD-10-CM | POA: Insufficient documentation

## 2017-12-07 DIAGNOSIS — R202 Paresthesia of skin: Secondary | ICD-10-CM | POA: Diagnosis not present

## 2017-12-07 NOTE — Progress Notes (Signed)
Name: Marc Stevens   MRN: [id number]    DOB: 1961/01/20   Date:12/07/2017       Progress Note  Subjective  Chief Complaint  Chief Complaint  Patient presents with  . Follow-up    suture removal thumb, no pain but some numbness    HPI  Pt presents with two concerns regarding his LEFT hand:  LEFT index finger - has had decreased range of motion, intermittent swelling, and waxing and waning pain for several months now - states no pattern to worsening of symptoms. He modifies activities to avoid using his index finger to help with the pain.    LEFT Thumb: Residual numbness along the lateral aspect of the left thumb s/p laceration injury with repair on 11/12/2017 with suture removal on 11/26/2017.  He denies numbness, laceration has healed nicely without any complication or infection. Has good AROM of the left thumb, does keep the area wrapped at work to protect it.    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   Patient Active Problem List   Diagnosis Date Noted  . Laceration of left thumb without foreign body without damage to nail 11/26/2017  . Prediabetes 08/13/2017  . Medication monitoring encounter 09/04/2016  . Hyperglycemia 09/04/2016  . Right-sided chest wall pain 02/21/2016  . Diastolic dysfunction without heart failure 01/17/2016  . Mitral regurgitation 01/17/2016  . LVH (left ventricular hypertrophy) 01/01/2016  . Preventative health care 01/01/2016  . Complete rotator cuff tear 08/08/2015  . Torn Achilles tendon 07/11/2015  . Low testosterone 05/14/2015  . 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 Vitamin D deficiency 04/26/2015  . Beta thalassemia minor 04/26/2015  . Morbid obesity ([place])   . ED (erectile dysfunction)   . Hyperlipidemia   . OSA (obstructive sleep apnea)   . HTN (hypertension) 04/18/2012    Social History   Tobacco Use  . Smoking  status: Former Smoker    Packs/day: 0.50    Years: 30.00    Pack years: 15.00    Types: Cigarettes    Last attempt to quit: 10/19/2004    Years since quitting: 13.1  . Smokeless tobacco: Never Used  Substance Use Topics  . Alcohol use: Yes    Alcohol/week: 0.0 oz    Comment: occasional     Current Outpatient Medications:  .  amLODipine (NORVASC) 2.5 MG tablet, Take 1 tablet (2.5 mg total) by mouth daily., Disp: 90 tablet, Rfl: 3 .  aspirin EC 81 MG tablet, Take 1 tablet (81 mg total) by mouth every morning. Resume 4 days post-op, Disp: , Rfl:  .  bacitracin ointment, Apply 1 application topically 2 (two) times daily., Disp: 15 g, Rfl: 0 .  carvedilol (COREG) 6.25 MG tablet, Take 6.25 mg by mouth 2 (two) times daily., Disp: , Rfl: 3 .  hydrochlorothiazide (HYDRODIURIL) 12.5 MG tablet, Take 12.5 mg by mouth daily., Disp: , Rfl: 5 .  ibuprofen (ADVIL,MOTRIN) 600 MG tablet, Take 1 tablet (600 mg total) by mouth every 6 (six) hours as needed., Disp: 30 tablet, Rfl: 0 .  ketoconazole (NIZORAL) 2 % cream, Apply 1 application topically 2 (two) times daily. (Patient taking differently: Apply 1 application topically as needed. ), Disp: 45 g, Rfl: 3 .  losartan (COZAAR) 100 MG tablet, TAKE 1 TABLET (100 MG TOTAL) BY MOUTH DAILY., Disp: 90 tablet, Rfl: 3 .  metFORMIN (  GLUCOPHAGE XR) 500 MG 24 hr tablet, Take 1 tablet (500 mg total) by mouth daily with breakfast. For one week, then two a day, Disp: 53 tablet, Rfl: 0 .  Multiple Vitamin (MULTIVITAMIN) tablet, Take 1 tablet by mouth daily., Disp: , Rfl:  .  sildenafil (REVATIO) 20 MG tablet, Take 1 tablet (20 mg total) by mouth 5 (five) times daily as needed. Take 2 to 5 tablets 30 minutes prior to intercourse on an empty stomach, Disp: 50 tablet, Rfl: 11  No Known Allergies  ROS Constitutional: Negative for fever or weight change.  Respiratory: Negative for cough and shortness of breath.   Cardiovascular: Negative for chest pain or palpitations.   Gastrointestinal: Negative for abdominal pain, no bowel changes.  Musculoskeletal: See HPI. Skin: Negative for rash.   Neurological: Negative for dizziness or headache.  No other specific complaints in a complete review of systems (except as listed in HPI above).  Objective  Vitals:   12/07/17 0821  BP: 118/72  Pulse: 69  Resp: 16  Temp: 98.3 F (36.8 C)  TempSrc: Oral  SpO2: 94%  Weight: 289 lb 8 oz (131.3 kg)  Height: 5\' 11"  (1.803 m)   Body mass index is 40.38 kg/m.  Nursing Note and Vital Signs reviewed.  Physical Exam  Constitutional: Patient appears well-developed and well-nourished. Obese No distress.  HEENT: head atraumatic, normocephalic Cardiovascular: Normal rate, regular rhythm, S1/S2 present.  No murmur or rub heard. No BLE edema. Pulmonary/Chest: Effort normal and breath sounds clear. No respiratory distress or retractions. Psychiatric: Patient has a normal mood and affect. behavior is normal. Judgment and thought content normal. Musculoskeletal: LEFT index finger exhibits limited flexion, no catches/clicks/pops, LEFT thumb has normal AROM.  Scar to left lateral thumb is well healed, edges approximated, with no exudate or bleeding.  LEFT hand is non-tender, though there is trace edema along the dorsal aspect of the left index finger.  PT self reports decreased sensation along the lateral aspect of the left thumb. Neurological: he is alert and oriented to person, place, and time. No cranial nerve deficit. Coordination, balance, strength, speech and gait are normal.   Skin: Skin is warm and dry. No rash noted. No erythema.   No results found for this or any previous visit (from the past 72 hour(s)).  Assessment & Plan  1. Numbness and tingling of left thumb - Ambulatory referral to Orthopedic Surgery - earliest appointment time possible.  2. Pain in finger of left hand - Ambulatory referral to Orthopedic Surgery - earliest appointment time possible.  -Red  flags and when to present for emergency care or RTC including fever >101.51F, chest pain, shortness of breath, new/worsening/un-resolving symptoms, reviewed with patient at time of visit. Follow up and care instructions discussed and provided in AVS.

## 2017-12-21 ENCOUNTER — Other Ambulatory Visit: Payer: Self-pay | Admitting: Family Medicine

## 2018-02-11 ENCOUNTER — Ambulatory Visit: Payer: BLUE CROSS/BLUE SHIELD | Admitting: Family Medicine

## 2018-02-11 ENCOUNTER — Telehealth: Payer: Self-pay | Admitting: Gastroenterology

## 2018-02-11 ENCOUNTER — Encounter: Payer: Self-pay | Admitting: Family Medicine

## 2018-02-11 VITALS — BP 132/78 | HR 76 | Temp 98.2°F | Resp 14 | Ht 71.0 in | Wt 288.6 lb

## 2018-02-11 DIAGNOSIS — I878 Other specified disorders of veins: Secondary | ICD-10-CM | POA: Diagnosis not present

## 2018-02-11 DIAGNOSIS — Z800 Family history of malignant neoplasm of digestive organs: Secondary | ICD-10-CM

## 2018-02-11 DIAGNOSIS — I517 Cardiomegaly: Secondary | ICD-10-CM | POA: Diagnosis not present

## 2018-02-11 DIAGNOSIS — R7303 Prediabetes: Secondary | ICD-10-CM

## 2018-02-11 DIAGNOSIS — I10 Essential (primary) hypertension: Secondary | ICD-10-CM

## 2018-02-11 NOTE — Telephone Encounter (Signed)
Gastroenterology Pre-Procedure Review  Request Date:   Requesting Physician: Dr.    PATIENT REVIEW QUESTIONS: The patient responded to the following health history questions as indicated:    1. Are you having any GI issues? no 2. Do you have a personal history of Polyps? no 3. Do you have a family history of Colon Cancer or Polyps? yes (Father colon cancer) 4. Diabetes Mellitus? no 5. Joint replacements in the past 12 months?no 6. Major health problems in the past 3 months?no 7. Any artificial heart valves, MVP, or defibrillator?no    MEDICATIONS & ALLERGIES:    Patient reports the following regarding taking any anticoagulation/antiplatelet therapy:   Plavix, Coumadin, Eliquis, Xarelto, Lovenox, Pradaxa, Brilinta, or Effient? no Aspirin? yes (81 mg ASA)  Patient confirms/reports the following medications:  Current Outpatient Medications  Medication Sig Dispense Refill   amLODipine (NORVASC) 2.5 MG tablet Take 1 tablet (2.5 mg total) by mouth daily. 90 tablet 3   aspirin EC 81 MG tablet Take 1 tablet (81 mg total) by mouth every morning. Resume 4 days post-op     carvedilol (COREG) 6.25 MG tablet Take 6.25 mg by mouth 2 (two) times daily.  3   hydrochlorothiazide (HYDRODIURIL) 12.5 MG tablet Take 12.5 mg by mouth daily.  5   ibuprofen (ADVIL,MOTRIN) 600 MG tablet Take 1 tablet (600 mg total) by mouth every 6 (six) hours as needed. 30 tablet 0   losartan (COZAAR) 100 MG tablet TAKE 1 TABLET (100 MG TOTAL) BY MOUTH DAILY. 90 tablet 3   metFORMIN (GLUCOPHAGE-XR) 500 MG 24 hr tablet Take 2 tablets (1,000 mg total) by mouth daily with breakfast. 60 tablet 5   Multiple Vitamin (MULTIVITAMIN) tablet Take 1 tablet by mouth daily.     sildenafil (REVATIO) 20 MG tablet Take 1 tablet (20 mg total) by mouth 5 (five) times daily as needed. Take 2 to 5 tablets 30 minutes prior to intercourse on an empty stomach 50 tablet 11   No current facility-administered medications for this visit.      Patient confirms/reports the following allergies:  No Known Allergies  No orders of the defined types were placed in this encounter.   AUTHORIZATION INFORMATION Primary Insurance: 1D#: Group #:  Secondary Insurance: 1D#: Group #:  SCHEDULE INFORMATION: Date:  Time: Location:

## 2018-02-11 NOTE — Progress Notes (Signed)
BP 132/78   Pulse 76   Temp 98.2 F (36.8 C) (Oral)   Resp 14   Ht 5\' 11"  (1.803 m)   Wt 288 lb 9.6 oz (130.9 kg)   SpO2 94%   BMI 40.25 kg/m    Subjective:    [name], male    DOB: 21-Jul-1961, 57 y.o.   MRN: [id number]  [name] is a 57 y.o. male  Chief Complaint  Patient presents with  . Follow-up    HPI Here for f/u  For the last 6 months, has noticed dark discoloration of the lower legs He got knee braces for both knees; arthritis, "too much weight on them"; that's when the changes started  Morbid obesity; he was working on weight loss; clothes are fitting better, despite only 2 pounds on the scale; still doing the walking, was doing a rep work-out, now doing 4 days, few body parts with each work-out; 4-5 exercises in that one day; Thursday and Friday are tough for work-outs; does walking in on those days; trying to drink enough water  HTN; on medicines; checking it every day; in the morning, takes his med, then goes to walk, then checks it; then when he gets home, checks again; morning and recheck in the morning, then evening, then recheck in evening; left readings at home; highest in the last two weeks 150/86, lowest maybe 116/68; normally in the high 120 to 138, bottom number 72 to 82; trying to avoid salt; he is happy with his meds for now; using NSAIDs less  [employer] in [place] makes his meals, meal plan; they are using stevia and low sodium  Prediabetes; taking metformin, tolerating well; does half and half on the sweet tea, about to go to unsweetened; trying to stay away from [place], will watch (limit)  Vitamin D deficiency  Functional Status Survey: Is the patient deaf or have difficulty hearing?: No Does the patient have difficulty seeing, even when wearing glasses/contacts?: No Does the patient have difficulty concentrating, remembering, or making decisions?: No Does the patient have difficulty walking or  climbing stairs?: No Does the patient have difficulty dressing or bathing?: No Does the patient have difficulty doing errands alone such as visiting a doctor's office or shopping?: No   Depression screen [facility] 2/9 02/11/2018 09/17/2017 08/27/2017 08/13/2017 03/12/2017  Decreased Interest 0 0 0 0 0  Down, Depressed, Hopeless 0 0 0 0 0  PHQ - 2 Score 0 0 0 0 0    Relevant past medical, surgical, family and social history reviewed Past Medical History:  Diagnosis Date  . BPH (benign prostatic hypertrophy)   . Diabetes mellitus without complication ([place])   . ED (erectile dysfunction)   . HTN (hypertension) 04/18/2012  . Hyperlipidemia   . Low testosterone   . Morbidly obese ([place])   . OSA (obstructive sleep apnea)    cpap - does not know settings   . Rotator cuff tear    RT  . Torn Achilles tendon 07/11/2015   Past Surgical History:  Procedure Laterality Date  . DOPPLER ECHOCARDIOGRAPHY  2013  . left kene arthroscopy     . LUMBAR LAMINECTOMY/DECOMPRESSION MICRODISCECTOMY N/A 01/30/2015   Procedure: MICRO LUMBAR DECOMPRESSION, BILATERAL FORAMINOTOMIES, MICRODISCECTOMY LUMBAR FOUR TO LUMBAR FIVE;  Surgeon: Susa Day, MD;  Location: [facility];  Service: Orthopedics;  Laterality: N/A;  . right knee surgery    . ROTATOR CUFF REPAIR  2007   Left  . SHOULDER OPEN ROTATOR  CUFF REPAIR Right 08/08/2015   Procedure: RIGHT SHOULDER MINI OPEN ROTATOR CUFF REPAIR SUBACROMIAL DECOMPRESSION ;  Surgeon: Susa Day, MD;  Location: [facility];  Service: Orthopedics;  Laterality: Right;   Family History  Problem Relation Age of Onset  . Dementia Mother   . Hypertension Mother   . Cancer Father        colon  . Heart disease Father   . Congestive Heart Failure Father   . Hypertension Sister   . Dementia Maternal Grandmother   . Kidney disease Neg Hx   . Prostate cancer Neg Hx   . COPD Neg Hx   . Diabetes Neg Hx   . Stroke Neg Hx   . Kidney cancer Neg Hx   . Bladder Cancer Neg Hx    Social  History   Tobacco Use  . Smoking status: Former Smoker    Packs/day: 0.50    Years: 30.00    Pack years: 15.00    Types: Cigarettes    Last attempt to quit: 10/19/2004    Years since quitting: 13.3  . Smokeless tobacco: Never Used  Substance Use Topics  . Alcohol use: Yes    Alcohol/week: 0.0 oz    Comment: occasional  . Drug use: No    Interim medical history since last visit reviewed. Allergies and medications reviewed  Review of Systems  Respiratory: Negative for wheezing.   Cardiovascular: Negative for chest pain.   Per HPI unless specifically indicated above     Objective:    BP 132/78   Pulse 76   Temp 98.2 F (36.8 C) (Oral)   Resp 14   Ht 5\' 11"  (1.803 m)   Wt 288 lb 9.6 oz (130.9 kg)   SpO2 94%   BMI 40.25 kg/m   Wt Readings from Last 3 Encounters:  02/18/18 288 lb (130.6 kg)  02/11/18 288 lb 9.6 oz (130.9 kg)  12/07/17 289 lb 8 oz (131.3 kg)    Physical Exam  Constitutional: He appears well-developed and well-nourished. No distress.  Morbidly obese  HENT:  Head: Normocephalic and atraumatic.  Eyes: EOM are normal. No scleral icterus.  Neck: No thyromegaly present.  Cardiovascular: Normal rate and regular rhythm.  Pulmonary/Chest: Effort normal and breath sounds normal.  Abdominal: He exhibits no distension.  Musculoskeletal: He exhibits no edema.  Neurological: Coordination normal.  Skin: Skin is warm and dry. No pallor.  Dark discoloration on both shins  Psychiatric: He has a normal mood and affect.    Results for orders placed or performed in visit on 11/12/17  Hemoglobin A1c  Result Value Ref Range   Hgb A1c MFr Bld 6.1 (H) <5.7 % of total Hgb   Mean Plasma Glucose 128 (calc)   eAG (mmol/L) 7.1 (calc)  Lipid panel  Result Value Ref Range   Cholesterol 115 <200 mg/dL   HDL 49 >40 mg/dL   Triglycerides 59 <150 mg/dL   LDL Cholesterol (Calc) 52 mg/dL (calc)   Total CHOL/HDL Ratio 2.3 <5.0 (calc)   Non-HDL Cholesterol (Calc) 66 <130  mg/dL (calc)  VITAMIN D 25 Hydroxy (Vit-D Deficiency, Fractures)  Result Value Ref Range   Vit D, 25-Hydroxy 22 (L) 30 - 100 ng/mL      Assessment & Plan:   Problem List Items Addressed This Visit      Cardiovascular and Mediastinum   LVH (left ventricular hypertrophy) (Chronic)    Weight loss key      Relevant Orders   ECHOCARDIOGRAM COMPLETE  HTN (hypertension) (Chronic)    Controlled; weight loss will help; dash guidelines        Other   Prediabetes    Goal is weight loss; check labs 6 months after last A1c; he is trying to watch what he eats      Morbid obesity ([place])    Patient is trying to work on this on his own; encouragement given; weight loss is the single most important thing he can do at this point for his heart, to prevent diabetes, etc.       Other Visit Diagnoses    Family history of colon cancer in father    -  Primary   Relevant Orders   Ambulatory referral to Gastroenterology   Venous stasis       Relevant Orders   Ambulatory referral to Vascular Surgery       Follow up plan: Return in about 3 months (around 05/13/2018) for follow-up visit with Dr. Sanda Klein.  An after-visit summary was printed and given to the patient at [facility].  Please see the patient instructions which may contain other information and recommendations beyond what is mentioned above in the assessment and plan.  No orders of the defined types were placed in this encounter.   Orders Placed This Encounter  Procedures  . Ambulatory referral to Vascular Surgery  . Ambulatory referral to Gastroenterology  . ECHOCARDIOGRAM COMPLETE

## 2018-02-11 NOTE — Patient Instructions (Addendum)
Check out the information at familydoctor.org entitled "Nutrition for Weight Loss: What You Need to Know about Fad Diets" Try to lose between 1-2 pounds per week by taking in fewer calories and burning off more calories You can succeed by limiting portions, limiting foods dense in calories and fat, becoming more active, and drinking 8 glasses of water a day (64 ounces) Don't skip meals, especially breakfast, as skipping meals may alter your metabolism Do not use over-the-counter weight loss pills or gimmicks that claim rapid weight loss A healthy BMI (or body mass index) is between 18.5 and 24.9 You can calculate your ideal BMI at the [place] website [employer]Mon[employer]fr Try to follow the DASH guidelines (DASH stands for Dietary Approaches to Stop Hypertension). Try to limit the sodium in your diet to no more than 1,500mg  of sodium per day. Certainly try to not exceed 2,000 mg per day at the very most. Do not add salt when cooking or at the table.  Check the sodium amount on labels when shopping, and choose items lower in sodium when given a choice. Avoid or limit foods that already contain a lot of sodium. Eat a diet rich in fruits and vegetables and whole grains, and try to lose weight if overweight or obese Return in 3 months for recheck of labs Pick up some over-the-counter vitamin D and take 1,000 iu daily We'll have you see the vascular doctor and the gastroenterologist soon If you have not heard anything from my staff in a week about any orders/referrals/studies from today, please contact us here to follow-up [phone]

## 2018-02-14 ENCOUNTER — Telehealth: Payer: Self-pay

## 2018-02-14 NOTE — Telephone Encounter (Signed)
.   Gastroenterology Pre-Procedure Review  Request Date: 02/18/2018 Requesting Physician: Dr. Vicente Males   PATIENT REVIEW QUESTIONS: The patient responded to the following health history questions as indicated:    1. Are you having any GI issues? No  2. Do you have a personal history of Polyps? No  3. Do you have a family history of Colon Cancer or Polyps? Father colon cancer 4. Diabetes Mellitus? Yes  5. Joint replacements in the past 12 months? No  6. Major health problems in the past 3 months? No  7. Any artificial heart valves, MVP, or defibrillator? No     MEDICATIONS & ALLERGIES:    Patient reports the following regarding taking any anticoagulation/antiplatelet therapy:   Plavix, Coumadin, Eliquis, Xarelto, Lovenox, Pradaxa, Brilinta, or Effient? No  Aspirin? Yes, 81 mg   Patient confirms/reports the following medications:  Current Outpatient Medications  Medication Sig Dispense Refill  . amLODipine (NORVASC) 2.5 MG tablet Take 1 tablet (2.5 mg total) by mouth daily. 90 tablet 3  . aspirin EC 81 MG tablet Take 1 tablet (81 mg total) by mouth every morning. Resume 4 days post-op    . carvedilol (COREG) 6.25 MG tablet Take 6.25 mg by mouth 2 (two) times daily.  3  . hydrochlorothiazide (HYDRODIURIL) 12.5 MG tablet Take 12.5 mg by mouth daily.  5  . ibuprofen (ADVIL,MOTRIN) 600 MG tablet Take 1 tablet (600 mg total) by mouth every 6 (six) hours as needed. 30 tablet 0  . losartan (COZAAR) 100 MG tablet TAKE 1 TABLET (100 MG TOTAL) BY MOUTH DAILY. 90 tablet 3  . metFORMIN (GLUCOPHAGE-XR) 500 MG 24 hr tablet Take 2 tablets (1,000 mg total) by mouth daily with breakfast. 60 tablet 5  . Multiple Vitamin (MULTIVITAMIN) tablet Take 1 tablet by mouth daily.    . sildenafil (REVATIO) 20 MG tablet Take 1 tablet (20 mg total) by mouth 5 (five) times daily as needed. Take 2 to 5 tablets 30 minutes prior to intercourse on an empty stomach 50 tablet 11   No current facility-administered medications for  this visit.     Patient confirms/reports the following allergies:  No Known Allergies  No orders of the defined types were placed in this encounter.   AUTHORIZATION INFORMATION Primary Insurance: 1D#: Group #:  Secondary Insurance: 1D#: Group #:  SCHEDULE INFORMATION: Date: 02/18/2018  Time: Location: [place]

## 2018-02-16 ENCOUNTER — Other Ambulatory Visit: Payer: Self-pay

## 2018-02-16 DIAGNOSIS — Z1211 Encounter for screening for malignant neoplasm of colon: Secondary | ICD-10-CM

## 2018-02-16 MED ORDER — NA SULFATE-K SULFATE-MG SULF 17.5-3.13-1.6 GM/177ML PO SOLN: 1.0000 | Freq: Once | ORAL | 0 refills | Status: AC

## 2018-02-16 NOTE — Progress Notes (Signed)
Patient instructions for colonoscopy have been sent via MyChart and to [place] as discussed.  I've faxed instructions and asked pharmacist to provide these instructions.  Rx for Su-Prep has been sent to pharmacy.  Thanks,  Sharyn Lull

## 2018-02-18 ENCOUNTER — Encounter
Admission: RE | Disposition: A | Discharge status: Home / Self Care | Payer: Self-pay | Source: Ambulatory Visit | Attending: Gastroenterology

## 2018-02-18 ENCOUNTER — Ambulatory Visit: Payer: BLUE CROSS/BLUE SHIELD | Admitting: Anesthesiology

## 2018-02-18 ENCOUNTER — Ambulatory Visit
Admission: RE | Admit: 2018-02-18 | Discharge: 2018-02-18 | Disposition: A | Discharge status: Home / Self Care | Payer: BLUE CROSS/BLUE SHIELD | Source: Ambulatory Visit | Attending: Gastroenterology | Admitting: Gastroenterology

## 2018-02-18 ENCOUNTER — Encounter: Payer: Self-pay | Admitting: *Deleted

## 2018-02-18 DIAGNOSIS — Z79899 Other long term (current) drug therapy: Secondary | ICD-10-CM | POA: Diagnosis not present

## 2018-02-18 DIAGNOSIS — Z1211 Encounter for screening for malignant neoplasm of colon: Secondary | ICD-10-CM | POA: Diagnosis present

## 2018-02-18 DIAGNOSIS — D122 Benign neoplasm of ascending colon: Secondary | ICD-10-CM | POA: Diagnosis not present

## 2018-02-18 DIAGNOSIS — Z800 Family history of malignant neoplasm of digestive organs: Secondary | ICD-10-CM

## 2018-02-18 DIAGNOSIS — E119 Type 2 diabetes mellitus without complications: Secondary | ICD-10-CM | POA: Insufficient documentation

## 2018-02-18 DIAGNOSIS — Z87891 Personal history of nicotine dependence: Secondary | ICD-10-CM | POA: Diagnosis not present

## 2018-02-18 DIAGNOSIS — I10 Essential (primary) hypertension: Secondary | ICD-10-CM | POA: Diagnosis not present

## 2018-02-18 DIAGNOSIS — G4733 Obstructive sleep apnea (adult) (pediatric): Secondary | ICD-10-CM | POA: Insufficient documentation

## 2018-02-18 DIAGNOSIS — N529 Male erectile dysfunction, unspecified: Secondary | ICD-10-CM | POA: Diagnosis not present

## 2018-02-18 DIAGNOSIS — E785 Hyperlipidemia, unspecified: Secondary | ICD-10-CM | POA: Insufficient documentation

## 2018-02-18 DIAGNOSIS — K621 Rectal polyp: Secondary | ICD-10-CM | POA: Diagnosis not present

## 2018-02-18 DIAGNOSIS — Z6841 Body Mass Index (BMI) 40.0 and over, adult: Secondary | ICD-10-CM | POA: Diagnosis not present

## 2018-02-18 DIAGNOSIS — Z7982 Long term (current) use of aspirin: Secondary | ICD-10-CM | POA: Insufficient documentation

## 2018-02-18 DIAGNOSIS — Z8249 Family history of ischemic heart disease and other diseases of the circulatory system: Secondary | ICD-10-CM | POA: Insufficient documentation

## 2018-02-18 DIAGNOSIS — Z7984 Long term (current) use of oral hypoglycemic drugs: Secondary | ICD-10-CM | POA: Insufficient documentation

## 2018-02-18 DIAGNOSIS — Z9989 Dependence on other enabling machines and devices: Secondary | ICD-10-CM | POA: Insufficient documentation

## 2018-02-18 DIAGNOSIS — K5730 Diverticulosis of large intestine without perforation or abscess without bleeding: Secondary | ICD-10-CM | POA: Insufficient documentation

## 2018-02-18 DIAGNOSIS — D123 Benign neoplasm of transverse colon: Secondary | ICD-10-CM | POA: Insufficient documentation

## 2018-02-18 HISTORY — PX: COLONOSCOPY WITH PROPOFOL: SHX5780

## 2018-02-18 HISTORY — DX: Type 2 diabetes mellitus without complications: E11.9

## 2018-02-18 SURGERY — COLONOSCOPY WITH PROPOFOL
Anesthesia: General

## 2018-02-18 MED ORDER — LIDOCAINE HCL (CARDIAC) PF 100 MG/5ML IV SOSY
PREFILLED_SYRINGE | INTRAVENOUS | Status: DC | PRN
  Administered 2018-02-18: 30 mg via INTRAVENOUS

## 2018-02-18 MED ORDER — SODIUM CHLORIDE 0.9 % IV SOLN
INTRAVENOUS | Status: DC
  Administered 2018-02-18: 1000 mL via INTRAVENOUS

## 2018-02-18 MED ORDER — PROPOFOL 500 MG/50ML IV EMUL
INTRAVENOUS | Status: AC
  Filled 2018-02-18: qty 50

## 2018-02-18 MED ORDER — PROPOFOL 10 MG/ML IV BOLUS
INTRAVENOUS | Status: DC | PRN
  Administered 2018-02-18: 50 mg via INTRAVENOUS

## 2018-02-18 MED ORDER — PROPOFOL 500 MG/50ML IV EMUL
INTRAVENOUS | Status: DC | PRN
  Administered 2018-02-18: 125 ug/kg/min via INTRAVENOUS

## 2018-02-18 NOTE — Anesthesia Preprocedure Evaluation (Signed)
Anesthesia Evaluation  Patient identified by MRN, date of birth, ID band Patient awake    Reviewed: Allergy & Precautions, H&P , NPO status , Patient's Chart, lab work & pertinent test results, reviewed documented beta blocker date and time   Airway Mallampati: II   Neck ROM: full    Dental  (+) Teeth Intact   Pulmonary neg pulmonary ROS, sleep apnea and Continuous Positive Airway Pressure Ventilation , former smoker,    Pulmonary exam normal        Cardiovascular Exercise Tolerance: Good hypertension, On Medications negative cardio ROS Normal cardiovascular exam Rhythm:regular Rate:Normal     Neuro/Psych negative neurological ROS  negative psych ROS   GI/Hepatic negative GI ROS, Neg liver ROS,   Endo/Other  negative endocrine ROSdiabetes  Renal/GU negative Renal ROS  negative genitourinary   Musculoskeletal   Abdominal   Peds  Hematology negative hematology ROS (+) anemia ,   Anesthesia Other Findings Past Medical History: No date: BPH (benign prostatic hypertrophy) No date: Diabetes mellitus without complication (HCC) No date: ED (erectile dysfunction) 04/18/2012: HTN (hypertension) No date: Hyperlipidemia No date: Low testosterone No date: Morbidly obese (HCC) No date: OSA (obstructive sleep apnea)     Comment:  cpap - does not know settings  No date: Rotator cuff tear     Comment:  RT 07/11/2015: Torn Achilles tendon Past Surgical History: 2013: DOPPLER ECHOCARDIOGRAPHY No date: left kene arthroscopy  01/30/2015: LUMBAR LAMINECTOMY/DECOMPRESSION MICRODISCECTOMY; N/A     Comment:  Procedure: MICRO LUMBAR DECOMPRESSION, BILATERAL               FORAMINOTOMIES, MICRODISCECTOMY LUMBAR FOUR TO LUMBAR               FIVE;  Surgeon: Susa Day, MD;  Location: [facility];                Service: Orthopedics;  Laterality: N/A; No date: right knee surgery 2007: ROTATOR CUFF REPAIR     Comment:  Left 08/08/2015:  SHOULDER OPEN ROTATOR CUFF REPAIR; Right     Comment:  Procedure: RIGHT SHOULDER MINI OPEN ROTATOR CUFF REPAIR               SUBACROMIAL DECOMPRESSION ;  Surgeon: Susa Day, MD;               Location: [facility];  Service: Orthopedics;  Laterality:               Right;   Reproductive/Obstetrics negative OB ROS                             Anesthesia Physical Anesthesia Plan  ASA: III  Anesthesia Plan: General   Post-op Pain Management:    Induction:   PONV Risk Score and Plan:   Airway Management Planned:   Additional Equipment:   Intra-op Plan:   Post-operative Plan:   Informed Consent: I have reviewed the patients History and Physical, chart, labs and discussed the procedure including the risks, benefits and alternatives for the proposed anesthesia with the patient or authorized representative who has indicated his/her understanding and acceptance.   Dental Advisory Given  Plan Discussed with: CRNA  Anesthesia Plan Comments:         Anesthesia Quick Evaluation

## 2018-02-18 NOTE — Anesthesia Postprocedure Evaluation (Signed)
Anesthesia Post Note  Patient: Marc Stevens  Procedure(s) Performed: COLONOSCOPY WITH PROPOFOL (N/A )  Patient location during evaluation: PACU Anesthesia Type: General Level of consciousness: awake and alert Pain management: pain level controlled Vital Signs Assessment: post-procedure vital signs reviewed and stable Respiratory status: spontaneous breathing, nonlabored ventilation, respiratory function stable and patient connected to nasal cannula oxygen Cardiovascular status: blood pressure returned to baseline and stable Postop Assessment: no apparent nausea or vomiting Anesthetic complications: no     Last Vitals:  Vitals:   02/18/18 0950 02/18/18 1000  BP: 132/90 (!) 146/92  Pulse: 73 66  Resp: 17 16  Temp:    SpO2: 98% 100%    Last Pain:  Vitals:   02/18/18 1000  TempSrc:   PainSc: 0-No pain                 Molli Barrows

## 2018-02-18 NOTE — Transfer of Care (Signed)
Immediate Anesthesia Transfer of Care Note  Patient: Marc Stevens  Procedure(s) Performed: COLONOSCOPY WITH PROPOFOL (N/A )  Patient Location: PACU and Endoscopy Unit  Anesthesia Type:General  Level of Consciousness: awake, alert  and oriented  Airway & Oxygen Therapy: Patient Spontanous Breathing and Patient connected to nasal cannula oxygen  Post-op Assessment: Report given to RN and Post -op Vital signs reviewed and stable  Post vital signs: Reviewed and stable  Last Vitals:  Vitals Value Taken Time  BP 124/76 02/18/2018  9:30 AM  Temp 36.4 C 02/18/2018  9:30 AM  Pulse 76 02/18/2018  9:30 AM  Resp 20 02/18/2018  9:30 AM  SpO2 100 % 02/18/2018  9:30 AM  Vitals shown include unvalidated device data.  Last Pain:  Vitals:   02/18/18 0930  TempSrc: Tympanic  PainSc: 0-No pain         Complications: No apparent anesthesia complications

## 2018-02-18 NOTE — H&P (Signed)
Jonathon Bellows, MD [address] [place], [place], [place], [address] [address], [place], [place], [place], [address] Phone: [phone]  Fax: [phone]  Primary Care Physician:  Arnetha Courser, MD   Pre-Procedure History & Physical: HPI:  PANKAJ HAACK is a 57 y.o. male is here for an colonoscopy.   Past Medical History:  Diagnosis Date  . BPH (benign prostatic hypertrophy)   . Diabetes mellitus without complication ([place])   . ED (erectile dysfunction)   . HTN (hypertension) 04/18/2012  . Hyperlipidemia   . Low testosterone   . Morbidly obese ([place])   . OSA (obstructive sleep apnea)    cpap - does not know settings   . Rotator cuff tear    RT  . Torn Achilles tendon 07/11/2015    Past Surgical History:  Procedure Laterality Date  . DOPPLER ECHOCARDIOGRAPHY  2013  . left kene arthroscopy     . LUMBAR LAMINECTOMY/DECOMPRESSION MICRODISCECTOMY N/A 01/30/2015   Procedure: MICRO LUMBAR DECOMPRESSION, BILATERAL FORAMINOTOMIES, MICRODISCECTOMY LUMBAR FOUR TO LUMBAR FIVE;  Surgeon: Susa Day, MD;  Location: [facility];  Service: Orthopedics;  Laterality: N/A;  . right knee surgery    . ROTATOR CUFF REPAIR  2007   Left  . SHOULDER OPEN ROTATOR CUFF REPAIR Right 08/08/2015   Procedure: RIGHT SHOULDER MINI OPEN ROTATOR CUFF REPAIR SUBACROMIAL DECOMPRESSION ;  Surgeon: Susa Day, MD;  Location: [facility];  Service: Orthopedics;  Laterality: Right;    Prior to Admission medications   Medication Sig Start Date End Date Taking? Authorizing Provider  carvedilol (COREG) 6.25 MG tablet Take 6.25 mg by mouth 2 (two) times daily. 11/29/15  Yes [provider]  amLODipine (NORVASC) 2.5 MG tablet Take 1 tablet (2.5 mg total) by mouth daily. 11/12/17   Arnetha Courser, MD  aspirin EC 81 MG tablet Take 1 tablet (81 mg total) by mouth every morning. Resume 4 days post-op 02/01/15   Lacie Draft M, PA-C  hydrochlorothiazide (HYDRODIURIL) 12.5 MG tablet Take 12.5 mg by  mouth daily. 07/19/15   [provider]  ibuprofen (ADVIL,MOTRIN) 600 MG tablet Take 1 tablet (600 mg total) by mouth every 6 (six) hours as needed. 11/13/17   Antonietta Breach, PA-C  losartan (COZAAR) 100 MG tablet TAKE 1 TABLET (100 MG TOTAL) BY MOUTH DAILY. 08/23/17   Arnetha Courser, MD  metFORMIN (GLUCOPHAGE-XR) 500 MG 24 hr tablet Take 2 tablets (1,000 mg total) by mouth daily with breakfast. 12/21/17   Lada, Satira Anis, MD  Multiple Vitamin (MULTIVITAMIN) tablet Take 1 tablet by mouth daily.    [provider]  sildenafil (REVATIO) 20 MG tablet Take 1 tablet (20 mg total) by mouth 5 (five) times daily as needed. Take 2 to 5 tablets 30 minutes prior to intercourse on an empty stomach 06/11/17   Zara Council A, PA-C    Allergies as of 02/16/2018  . (No Known Allergies)    Family History  Problem Relation Age of Onset  . Dementia Mother   . Hypertension Mother   . Cancer Father        colon  . Heart disease Father   . Congestive Heart Failure Father   . Hypertension Sister   . Dementia Maternal Grandmother   . Kidney disease Neg Hx   . Prostate cancer Neg Hx   . COPD Neg Hx   . Diabetes Neg Hx   . Stroke Neg Hx   . Kidney cancer Neg Hx   .  Bladder Cancer Neg Hx     Social History   Socioeconomic History  . Marital status: Single    Spouse name: Not on file  . Number of children: Not on file  . Years of education: Not on file  . Highest education level: Not on file  Occupational History    Employer: [employer]    Employer: [employer]  Social Needs  . Financial resource strain: Not on file  . Food insecurity:    Worry: Not on file    Inability: Not on file  . Transportation needs:    Medical: Not on file    Non-medical: Not on file  Tobacco Use  . Smoking status: Former Smoker    Packs/day: 0.50    Years: 30.00    Pack years: 15.00    Types: Cigarettes    Last attempt to quit: 10/19/2004    Years since quitting: 13.3  . Smokeless tobacco: Never Used    Substance and Sexual Activity  . Alcohol use: Yes    Alcohol/week: 0.0 oz    Comment: occasional  . Drug use: No  . Sexual activity: Yes    Birth control/protection: None  Lifestyle  . Physical activity:    Days per week: Not on file    Minutes per session: Not on file  . Stress: Not on file  Relationships  . Social connections:    Talks on phone: Not on file    Gets together: Not on file    Attends religious service: Not on file    Active member of club or organization: Not on file    Attends meetings of clubs or organizations: Not on file    Relationship status: Not on file  . Intimate partner violence:    Fear of current or ex partner: Not on file    Emotionally abused: Not on file    Physically abused: Not on file    Forced sexual activity: Not on file  Other Topics Concern  . Not on file  Social History Narrative  . Not on file    Review of Systems: See HPI, otherwise negative ROS  Physical Exam: There were no vitals taken for this visit. General:   Alert,  pleasant and cooperative in NAD Head:  Normocephalic and atraumatic. Neck:  Supple; no masses or thyromegaly. Lungs:  Clear throughout to auscultation, normal respiratory effort.    Heart:  +S1, +S2, Regular rate and rhythm, No edema. Abdomen:  Soft, nontender and nondistended. Normal bowel sounds, without guarding, and without rebound.   Neurologic:  Alert and  oriented x4;  grossly normal neurologically.  Impression/Plan: TRINI CHRISTIANSEN is here for an colonoscopy to be performed for Screening colonoscopy father had colon cancer Risks, benefits, limitations, and alternatives regarding  colonoscopy have been reviewed with the patient.  Questions have been answered.  All parties agreeable.   Jonathon Bellows, MD  02/18/2018, 8:25 AM

## 2018-02-18 NOTE — Op Note (Signed)
[facility] Gastroenterology Patient Name: Marc Stevens Procedure Date: 02/18/2018 8:54 AM MRN: [id number] Account #: [id number] Date of Birth: [birth year] Admit Type: Outpatient Age: 57 Room: [facility] ENDO ROOM 4 Gender: Male Note Status: Finalized Procedure:            Colonoscopy Providers:            Jonathon Bellows MD, MD Referring MD:         Arnetha Courser (Referring MD) Medicines:            Monitored Anesthesia Care Complications:        No immediate complications. Procedure:            Pre-Anesthesia Assessment:                       - Prior to the procedure, a History and Physical was                        performed, and patient medications, allergies and                        sensitivities were reviewed. The patient's tolerance of                        previous anesthesia was reviewed.                       - The risks and benefits of the procedure and the                        sedation options and risks were discussed with the                        patient. All questions were answered and informed                        consent was obtained.                       - ASA Grade Assessment: II - A patient with mild                        systemic disease.                       After obtaining informed consent, the colonoscope was                        passed under direct vision. Throughout the procedure,                        the patient's blood pressure, pulse, and oxygen                        saturations were monitored continuously. The                        Colonoscope was introduced through the anus and                        advanced to the the cecum, identified by the  appendiceal orifice, IC valve and transillumination.                        The colonoscopy was performed without difficulty. The                        patient tolerated the procedure well. The quality of                        the bowel preparation was  good. Findings:      Three sessile polyps were found in the rectum and transverse colon. The       polyps were 5 to 6 mm in size. These polyps were removed with a cold       snare. Resection and retrieval were complete.      A 3 mm polyp was found in the ascending colon. The polyp was sessile.       The polyp was removed with a cold biopsy forceps. Resection and       retrieval were complete.      Multiple small-mouthed diverticula were found in the sigmoid colon.      The exam was otherwise without abnormality on direct and retroflexion       views. Impression:           - Three 5 to 6 mm polyps in the rectum and in the                        transverse colon, removed with a cold snare. Resected                        and retrieved.                       - One 3 mm polyp in the ascending colon, removed with a                        cold biopsy forceps. Resected and retrieved.                       - Diverticulosis in the sigmoid colon.                       - The examination was otherwise normal on direct and                        retroflexion views. Recommendation:       - Discharge patient to home (with escort).                       - Resume previous diet.                       - Continue present medications.                       - Await pathology results.                       - Repeat colonoscopy in 3 - 5 years for surveillance  based on pathology results. Procedure Code(s):    --- Professional ---                       [phone], Colonoscopy, flexible; with removal of tumor(s),                        polyp(s), or other lesion(s) by snare technique                       [id number], 34, Colonoscopy, flexible; with biopsy, single                        or multiple Diagnosis Code(s):    --- Professional ---                       K62.1, Rectal polyp                       D12.3, Benign neoplasm of transverse colon (hepatic                        flexure or splenic  flexure)                       D12.2, Benign neoplasm of ascending colon                       K57.30, Diverticulosis of large intestine without                        perforation or abscess without bleeding CPT copyright 2017 American Medical Association. All rights reserved. The codes documented in this report are preliminary and upon coder review may  be revised to meet current compliance requirements. Jonathon Bellows, MD Jonathon Bellows MD, MD 02/18/2018 9:29:02 AM This report has been signed electronically. Number of Addenda: 0 Note Initiated On: 02/18/2018 8:54 AM Scope Withdrawal Time: 0 hours 18 minutes 24 seconds  Total Procedure Duration: 0 hours 20 minutes 19 seconds       [facility]

## 2018-02-18 NOTE — Anesthesia Post-op Follow-up Note (Signed)
Anesthesia QCDR form completed.        

## 2018-02-20 ENCOUNTER — Telehealth: Payer: Self-pay | Admitting: Family Medicine

## 2018-02-20 NOTE — Assessment & Plan Note (Signed)
Weight loss key

## 2018-02-20 NOTE — Telephone Encounter (Signed)
I put in an order for a repeat echocardiogram for the patient We're following changes to his heart from his morbid obesity and his blood pressure Please let him know how to schedule Thank you

## 2018-02-20 NOTE — Assessment & Plan Note (Signed)
Patient is trying to work on this on his own; encouragement given; weight loss is the single most important thing he can do at this point for his heart, to prevent diabetes, etc.

## 2018-02-20 NOTE — Assessment & Plan Note (Signed)
Controlled; weight loss will help; dash guidelines

## 2018-02-20 NOTE — Assessment & Plan Note (Signed)
Goal is weight loss; check labs 6 months after last A1c; he is trying to watch what he eats

## 2018-02-21 ENCOUNTER — Encounter: Payer: Self-pay | Admitting: Gastroenterology

## 2018-02-21 LAB — SURGICAL PATHOLOGY

## 2018-02-21 NOTE — Telephone Encounter (Signed)
Pt.notified

## 2018-02-26 ENCOUNTER — Encounter: Payer: Self-pay | Admitting: Gastroenterology

## 2018-03-04 ENCOUNTER — Ambulatory Visit
Admission: RE | Admit: 2018-03-04 | Discharge: 2018-03-04 | Disposition: A | Discharge status: Home / Self Care | Payer: BLUE CROSS/BLUE SHIELD | Source: Ambulatory Visit | Attending: Family Medicine | Admitting: Family Medicine

## 2018-03-04 ENCOUNTER — Encounter: Payer: Self-pay | Admitting: Family Medicine

## 2018-03-04 DIAGNOSIS — E119 Type 2 diabetes mellitus without complications: Secondary | ICD-10-CM | POA: Diagnosis not present

## 2018-03-04 DIAGNOSIS — E785 Hyperlipidemia, unspecified: Secondary | ICD-10-CM | POA: Insufficient documentation

## 2018-03-04 DIAGNOSIS — I119 Hypertensive heart disease without heart failure: Secondary | ICD-10-CM | POA: Insufficient documentation

## 2018-03-04 DIAGNOSIS — I517 Cardiomegaly: Secondary | ICD-10-CM | POA: Diagnosis not present

## 2018-03-04 NOTE — Progress Notes (Signed)
*  PRELIMINARY RESULTS* Echocardiogram 2D Echocardiogram has been performed.  Marc Stevens 03/04/2018, 10:31 AM

## 2018-03-06 ENCOUNTER — Encounter: Payer: Self-pay | Admitting: Family Medicine

## 2018-03-06 DIAGNOSIS — I10 Essential (primary) hypertension: Secondary | ICD-10-CM

## 2018-03-06 DIAGNOSIS — I340 Nonrheumatic mitral (valve) insufficiency: Secondary | ICD-10-CM

## 2018-03-06 DIAGNOSIS — I517 Cardiomegaly: Secondary | ICD-10-CM

## 2018-03-07 ENCOUNTER — Encounter: Payer: Self-pay | Admitting: Family Medicine

## 2018-03-07 MED ORDER — HYDROCHLOROTHIAZIDE 12.5 MG PO TABS: 12.5000 mg | ORAL_TABLET | Freq: Every day | ORAL | 5 refills | Status: DC

## 2018-03-10 ENCOUNTER — Encounter: Payer: Self-pay | Admitting: Family Medicine

## 2018-03-11 ENCOUNTER — Ambulatory Visit (INDEPENDENT_AMBULATORY_CARE_PROVIDER_SITE_OTHER): Payer: BLUE CROSS/BLUE SHIELD | Admitting: Vascular Surgery

## 2018-03-11 ENCOUNTER — Encounter (INDEPENDENT_AMBULATORY_CARE_PROVIDER_SITE_OTHER): Payer: Self-pay | Admitting: Vascular Surgery

## 2018-03-11 VITALS — BP 131/85 | HR 69 | Resp 17 | Ht 71.0 in | Wt 279.8 lb

## 2018-03-11 DIAGNOSIS — R600 Localized edema: Secondary | ICD-10-CM | POA: Diagnosis not present

## 2018-03-11 DIAGNOSIS — I878 Other specified disorders of veins: Secondary | ICD-10-CM | POA: Diagnosis not present

## 2018-03-11 NOTE — Progress Notes (Signed)
Subjective:    [name], male    DOB: May 04, 1961, 57 y.o.   MRN: [id number] Chief Complaint  Patient presents with  . New Patient (Initial Visit)    ref [facility] for venous stasis   Presents as a new patient referred by Dr. Jacinto Reap for evaluation of bilateral lower extremity venous stasis.  The patient notes a long-standing history of discoloration to the bilateral legs.  Patient also experiences bilateral lower extremity edema.  The edema is worse towards the end of the day or with sitting and standing for long periods of time.  The patient denies any surgery or trauma to the bilateral legs.  At this time, the patient does not engage in conservative therapy including wearing medical grade 1 compression socks or elevating his legs.  The edema is associated with some discomfort.  The patient notes that his venous stasis and edema have progressively worsened over the last few years.  He does feel like his symptoms have graft to the point that he is unable to function on a daily basis.  The patient denies any claudication-like symptoms, rest pain or ulceration to the bilateral lower extremity.  Patient denies any fever, nausea vomiting.  Review of Systems  Constitutional: Negative.   HENT: Negative.   Eyes: Negative.   Respiratory: Negative.   Cardiovascular: Positive for leg swelling.       Venous Stasis  Gastrointestinal: Negative.   Endocrine: Negative.   Genitourinary: Negative.   Musculoskeletal: Negative.   Skin: Negative.   Allergic/Immunologic: Negative.   Neurological: Negative.   Hematological: Negative.   Psychiatric/Behavioral: Negative.       Objective:   Physical Exam  Constitutional: He is oriented to person, place, and time. He appears well-developed and well-nourished. No distress.  HENT:  Head: Normocephalic and atraumatic.  Right Ear: External ear normal.  Left Ear: External ear normal.  Eyes: Pupils are equal, round, and reactive to light.  Conjunctivae and EOM are normal.  Neck: Normal range of motion.  Cardiovascular: Normal rate, regular rhythm, normal heart sounds and intact distal pulses.  Pulses:      Radial pulses are 2+ on the right side, and 2+ on the left side.       Dorsalis pedis pulses are 2+ on the right side, and 2+ on the left side.       Posterior tibial pulses are 2+ on the right side, and 2+ on the left side.  Pulmonary/Chest: Effort normal and breath sounds normal.  Musculoskeletal: Normal range of motion. He exhibits edema (Moderate nonpitting bilateral lower extremity edema noted).  Neurological: He is alert and oriented to person, place, and time.  Skin: Skin is warm and dry. He is not diaphoretic.  Minimal varicosities noted to the bilateral lower extremity.  Moderate to severe venous stasis noted bilaterally.  There is no fibrosis, cellulitis or active ulcerations lower extremity  Psychiatric: He has a normal mood and affect. His behavior is normal. Judgment and thought content normal.  Vitals reviewed.  BP 131/85 (BP Location: Left Arm)   Pulse 69   Resp 17   Ht 5\' 11"  (1.803 m)   Wt 279 lb 12.8 oz (126.9 kg)   BMI 39.02 kg/m   Past Medical History:  Diagnosis Date  . BPH (benign prostatic hypertrophy)   . Diabetes mellitus without complication ([place])   . ED (erectile dysfunction)   . HTN (hypertension) 04/18/2012  . Hyperlipidemia   . Low testosterone   .  Morbidly obese ([place])   . OSA (obstructive sleep apnea)    cpap - does not know settings   . Rotator cuff tear    RT  . Torn Achilles tendon 07/11/2015   Social History   Socioeconomic History  . Marital status: Single    Spouse name: Not on file  . Number of children: Not on file  . Years of education: Not on file  . Highest education level: Not on file  Occupational History    Employer: [employer]    Employer: [employer]  Social Needs  . Financial resource strain: Not on file  . Food insecurity:    Worry: Not on file    Inability: Not  on file  . Transportation needs:    Medical: Not on file    Non-medical: Not on file  Tobacco Use  . Smoking status: Former Smoker    Packs/day: 0.50    Years: 30.00    Pack years: 15.00    Types: Cigarettes    Last attempt to quit: 10/19/2004    Years since quitting: 13.4  . Smokeless tobacco: Never Used  Substance and Sexual Activity  . Alcohol use: Yes    Alcohol/week: 0.0 oz    Comment: occasional  . Drug use: No  . Sexual activity: Yes    Birth control/protection: None  Lifestyle  . Physical activity:    Days per week: Not on file    Minutes per session: Not on file  . Stress: Not on file  Relationships  . Social connections:    Talks on phone: Not on file    Gets together: Not on file    Attends religious service: Not on file    Active member of club or organization: Not on file    Attends meetings of clubs or organizations: Not on file    Relationship status: Not on file  . Intimate partner violence:    Fear of current or ex partner: Not on file    Emotionally abused: Not on file    Physically abused: Not on file    Forced sexual activity: Not on file  Other Topics Concern  . Not on file  Social History Narrative  . Not on file   Past Surgical History:  Procedure Laterality Date  . COLONOSCOPY WITH PROPOFOL N/A 02/18/2018   Procedure: COLONOSCOPY WITH PROPOFOL;  Surgeon: Jonathon Bellows, MD;  Location: [facility];  Service: Gastroenterology;  Laterality: N/A;  . DOPPLER ECHOCARDIOGRAPHY  2013  . left kene arthroscopy     . LUMBAR LAMINECTOMY/DECOMPRESSION MICRODISCECTOMY N/A 01/30/2015   Procedure: MICRO LUMBAR DECOMPRESSION, BILATERAL FORAMINOTOMIES, MICRODISCECTOMY LUMBAR FOUR TO LUMBAR FIVE;  Surgeon: Susa Day, MD;  Location: [facility] ORS;  Service: Orthopedics;  Laterality: N/A;  . right knee surgery    . ROTATOR CUFF REPAIR  2007   Left  . SHOULDER OPEN ROTATOR CUFF REPAIR Right 08/08/2015   Procedure: RIGHT SHOULDER MINI OPEN ROTATOR CUFF REPAIR  SUBACROMIAL DECOMPRESSION ;  Surgeon: Susa Day, MD;  Location: [facility] ORS;  Service: Orthopedics;  Laterality: Right;   Family History  Problem Relation Age of Onset  . Dementia Mother   . Hypertension Mother   . Cancer Father        colon  . Heart disease Father   . Congestive Heart Failure Father   . Hypertension Sister   . Dementia Maternal Grandmother   . Kidney disease Neg Hx   . Prostate cancer Neg Hx   . COPD Neg  Hx   . Diabetes Neg Hx   . Stroke Neg Hx   . Kidney cancer Neg Hx   . Bladder Cancer Neg Hx    No Known Allergies     Assessment & Plan:  Presents as a new patient referred by Dr. Jacinto Reap for evaluation of bilateral lower extremity venous stasis.  The patient notes a long-standing history of discoloration to the bilateral legs.  Patient also experiences bilateral lower extremity edema.  The edema is worse towards the end of the day or with sitting and standing for long periods of time.  The patient denies any surgery or trauma to the bilateral legs.  At this time, the patient does not engage in conservative therapy including wearing medical grade 1 compression socks or elevating his legs.  The edema is associated with some discomfort.  The patient notes that his venous stasis and edema have progressively worsened over the last few years.  He does feel like his symptoms have graft to the point that he is unable to function on a daily basis.  The patient denies any claudication-like symptoms, rest pain or ulceration to the bilateral lower extremity.  Patient denies any fever, nausea vomiting.  1. Bilateral lower extremity edema - New The patient was encouraged to wear graduated compression stockings (20-30 mmHg) on a daily basis. The patient was instructed to begin wearing the stockings first thing in the morning and removing them in the evening. The patient was instructed specifically not to sleep in the stockings. Prescription given.  In addition, behavioral modification  including elevation during the day will be initiated. Anti-inflammatories for pain. I will bring patient back to undergo a bilateral lower extremity venous duplex to rule out reflux.   The patient will follow up in one months to asses conservative management.  Information on chronic venous insufficiency and compression stockings was given to the patient. The patient was instructed to call the office in the interim if any worsening edema or ulcerations to the legs, feet or toes occurs. The patient expresses their understanding.  - VAS [place] LOWER EXTREMITY VENOUS REFLUX; Future  2. Venous stasis - New As above  Current Outpatient Medications on File Prior to Visit  Medication Sig Dispense Refill  . amLODipine (NORVASC) 2.5 MG tablet Take 1 tablet (2.5 mg total) by mouth daily. 90 tablet 3  . aspirin EC 81 MG tablet Take 1 tablet (81 mg total) by mouth every morning. Resume 4 days post-op    . carvedilol (COREG) 6.25 MG tablet Take 6.25 mg by mouth 2 (two) times daily.  3  . hydrochlorothiazide (HYDRODIURIL) 12.5 MG tablet Take 1 tablet (12.5 mg total) by mouth daily. 30 tablet 5  . losartan (COZAAR) 100 MG tablet TAKE 1 TABLET (100 MG TOTAL) BY MOUTH DAILY. 90 tablet 3  . meloxicam (MOBIC) 15 MG tablet Take 15 mg by mouth daily.  3  . metFORMIN (GLUCOPHAGE-XR) 500 MG 24 hr tablet Take 2 tablets (1,000 mg total) by mouth daily with breakfast. 60 tablet 5  . sildenafil (REVATIO) 20 MG tablet Take 1 tablet (20 mg total) by mouth 5 (five) times daily as needed. Take 2 to 5 tablets 30 minutes prior to intercourse on an empty stomach 50 tablet 11  . ibuprofen (ADVIL,MOTRIN) 600 MG tablet Take 1 tablet (600 mg total) by mouth every 6 (six) hours as needed. (Patient not taking: Reported on 03/11/2018) 30 tablet 0  . Multiple Vitamin (MULTIVITAMIN) tablet Take 1 tablet by mouth daily.  No current facility-administered medications on file prior to visit.    There are no Patient Instructions on file  for this visit. No follow-ups on file.  Emmalene Kattner A Serin Thornell, PA-C

## 2018-04-27 ENCOUNTER — Ambulatory Visit (INDEPENDENT_AMBULATORY_CARE_PROVIDER_SITE_OTHER): Payer: BLUE CROSS/BLUE SHIELD | Admitting: Vascular Surgery

## 2018-04-27 ENCOUNTER — Encounter (INDEPENDENT_AMBULATORY_CARE_PROVIDER_SITE_OTHER): Payer: BLUE CROSS/BLUE SHIELD

## 2018-05-06 ENCOUNTER — Encounter: Payer: Self-pay | Admitting: Cardiovascular Disease

## 2018-05-06 ENCOUNTER — Ambulatory Visit: Payer: BLUE CROSS/BLUE SHIELD | Admitting: Cardiovascular Disease

## 2018-05-06 VITALS — BP 148/70 | HR 73 | Ht 71.0 in | Wt 287.0 lb

## 2018-05-06 DIAGNOSIS — R0789 Other chest pain: Secondary | ICD-10-CM

## 2018-05-06 DIAGNOSIS — I119 Hypertensive heart disease without heart failure: Secondary | ICD-10-CM

## 2018-05-06 DIAGNOSIS — I10 Essential (primary) hypertension: Secondary | ICD-10-CM

## 2018-05-06 NOTE — Patient Instructions (Signed)
Medication Instructions: No change in medications.   Labwork: None.   Procedures/Testing: None.   Follow-Up: As needed with Dr. Fletcher Anon.   Any Additional Special Instructions Will Be Listed Below (If Applicable).     If you need a refill on your cardiac medications before your next appointment, please call your pharmacy.

## 2018-05-06 NOTE — Progress Notes (Signed)
Cardiology Office Note   Date:  05/06/2018   ID:  [name], DOB [birth year], MRN [id number]  PCP:  Arnetha Courser, MD  Cardiologist:   Kathlyn Sacramento, MD   Chief Complaint  Patient presents with  . OTHER    Bilateral edema, sob and elevated BP. Meds reviewed verbally with pt.      History of Present Illness: Marc Stevens is a 57 y.o. male who was referred by Dr. Sanda Klein for evaluation of possible congestive heart failure.  The patient was seen by me in 2017 for atypical chest pain.   He has known history of hypertension, hyperlipidemia, obstructive sleep apnea and previous tobacco use. He had previous cardiac work-up in 2016 which included a negative treadmill stress test.  He did have hypertensive response to exercise.   He has known history of prolonged hypertension on multiple medications.  He has been seen by Dr. Juleen China for this.  He mentions that his leg edema worsened after hydrochlorothiazide was decreased to 12.5 mg once daily.  He is also on amlodipine 2.5 mg once daily.  The patient is thought to have chronic venous insufficiency and his edema improved with support stockings.  He reports mild exertional dyspnea with activities without chest discomfort. He denies excessive alcohol use and he only drinks on weekends.  His father died of congestive heart failure at 56 years old.  He had an echocardiogram done in May of this year which showed normal ejection fraction with mild LVH and grade 1 diastolic dysfunction.  Past Medical History:  Diagnosis Date  . BPH (benign prostatic hypertrophy)   . Diabetes mellitus without complication ([place])   . ED (erectile dysfunction)   . HTN (hypertension) 04/18/2012  . Hyperlipidemia   . Low testosterone   . Morbidly obese ([place])   . OSA (obstructive sleep apnea)    cpap - does not know settings   . Rotator cuff tear    RT  . Torn Achilles tendon 07/11/2015    Past Surgical History:  Procedure Laterality Date  .  COLONOSCOPY WITH PROPOFOL N/A 02/18/2018   Procedure: COLONOSCOPY WITH PROPOFOL;  Surgeon: Jonathon Bellows, MD;  Location: [facility] ENDOSCOPY;  Service: Gastroenterology;  Laterality: N/A;  . DOPPLER ECHOCARDIOGRAPHY  2013  . left kene arthroscopy     . LUMBAR LAMINECTOMY/DECOMPRESSION MICRODISCECTOMY N/A 01/30/2015   Procedure: MICRO LUMBAR DECOMPRESSION, BILATERAL FORAMINOTOMIES, MICRODISCECTOMY LUMBAR FOUR TO LUMBAR FIVE;  Surgeon: Susa Day, MD;  Location: [facility]S;  Service: Orthopedics;  Laterality: N/A;  . right knee surgery    . ROTATOR CUFF REPAIR  2007   Left  . SHOULDER OPEN ROTATOR CUFF REPAIR Right 08/08/2015   Procedure: RIGHT SHOULDER MINI OPEN ROTATOR CUFF REPAIR SUBACROMIAL DECOMPRESSION ;  Surgeon: Susa Day, MD;  Location: [facility]S;  Service: Orthopedics;  Laterality: Right;     Current Outpatient Medications  Medication Sig Dispense Refill  . amLODipine (NORVASC) 2.5 MG tablet Take 1 tablet (2.5 mg total) by mouth daily. 90 tablet 3  . aspirin EC 81 MG tablet Take 1 tablet (81 mg total) by mouth every morning. Resume 4 days post-op    . carvedilol (COREG) 6.25 MG tablet Take 6.25 mg by mouth 2 (two) times daily.  3  . hydrochlorothiazide (HYDRODIURIL) 12.5 MG tablet Take 1 tablet (12.5 mg total) by mouth daily. 30 tablet 5  . ibuprofen (ADVIL,MOTRIN) 600 MG tablet Take 1 tablet (600 mg total) by mouth every 6 (six) hours as needed. [place]  tablet 0  . losartan (COZAAR) 100 MG tablet TAKE 1 TABLET (100 MG TOTAL) BY MOUTH DAILY. 90 tablet 3  . metFORMIN (GLUCOPHAGE-XR) 500 MG 24 hr tablet Take 2 tablets (1,000 mg total) by mouth daily with breakfast. 60 tablet 5  . Multiple Vitamin (MULTIVITAMIN) tablet Take 1 tablet by mouth daily.    . Naproxen-Esomeprazole (VIMOVO) 500-20 MG TBEC Vimovo 500 mg-20 mg tablet,immediate and delay release  Take 1 tablet twice a day by oral route.    . sildenafil (REVATIO) 20 MG tablet Take 1 tablet (20 mg total) by mouth 5 (five) times daily as needed.  Take 2 to 5 tablets 30 minutes prior to intercourse on an empty stomach 50 tablet 11   No current facility-administered medications for this visit.     Allergies:   Patient has no known allergies.    Social History:  The patient  reports that he quit smoking about 13 years ago. His smoking use included cigarettes. He has a 15.00 pack-year smoking history. He has never used smokeless tobacco. He reports that he drinks alcohol. He reports that he does not use drugs.   Family History:  The patient's family history includes Cancer in his father; Congestive Heart Failure in his father; Dementia in his maternal grandmother and mother; Heart disease in his father; Hypertension in his mother and sister.    ROS:  Please see the history of present illness.   Otherwise, review of systems are positive for none.   All other systems are reviewed and negative.    PHYSICAL EXAM: VS:  BP (!) 148/70 (BP Location: Left Arm, Patient Position: Sitting, Cuff Size: Large)   Pulse 73   Ht 5\' 11"  (1.803 m)   Wt 287 lb (130.2 kg)   BMI 40.03 kg/m  , BMI Body mass index is 40.03 kg/m. GEN: Well nourished, well developed, in no acute distress  HEENT: normal  Neck: no JVD, carotid bruits, or masses Cardiac: RRR; no murmurs, rubs, or gallops,no edema  Respiratory:  clear to auscultation bilaterally, normal work of breathing GI: soft, nontender, nondistended, + BS MS: no deformity or atrophy  Skin: warm and dry, no rash Neuro:  Strength and sensation are intact Psych: euthymic mood, full affect   EKG:  EKG is ordered today. The ekg ordered today demonstrates normal sinus rhythm with nonspecific T wave changes.   Recent Labs: 06/29/2017: Brain Natriuretic Peptide 4; BUN 21; Creat 1.12; Potassium 3.9; Sodium 141    Lipid Panel    Component Value Date/Time   CHOL 115 11/12/2017 1029   CHOL 137 02/21/2016 1110   TRIG 59 11/12/2017 1029   HDL 49 11/12/2017 1029   HDL 48 02/21/2016 1110   CHOLHDL 2.3  11/12/2017 1029   VLDL 14 03/12/2017 1043   LDLCALC 52 11/12/2017 1029      Wt Readings from Last 3 Encounters:  05/06/18 287 lb (130.2 kg)  03/11/18 279 lb 12.8 oz (126.9 kg)  02/18/18 288 lb (130.6 kg)       ASSESSMENT AND PLAN:  1.  Hypertensive heart disease without heart failure: I do not see evidence of heart failure currently and I did review the echocardiogram with him which was overall consistent with mild hypertensive heart disease as noted by LVH and grade 1 diastolic dysfunction.  I do not see evidence of volume overload.  His leg edema is likely due to chronic venous insufficiency and not related to a cardiac condition. He has no ischemic cardiac symptoms  and his EKG today is normal. I explained to him that he is at risk for heart failure and we need to control his risk factors very well including hypertension, sleep apnea and obesity.  2. Essential hypertension: Blood pressure is mildly elevated.  Given underlying leg edema, consider stopping amlodipine and increasing hydrochlorothiazide.  There is also room to increase carvedilol if needed.  Spironolactone can be considered as a last resort if blood pressure continues to be uncontrolled in spite of multiple medications.  3.  Obesity: I discussed with the patient the importance of close monitoring of calorie intake and regular exercise.  I suggested some apps to help him with this.   Disposition:   FU with me as needed.  Signed,  Kathlyn Sacramento, MD  05/06/2018 9:21 AM    [place]

## 2018-05-13 ENCOUNTER — Encounter: Payer: Self-pay | Admitting: Family Medicine

## 2018-05-13 ENCOUNTER — Ambulatory Visit: Payer: BLUE CROSS/BLUE SHIELD | Admitting: Family Medicine

## 2018-05-13 ENCOUNTER — Telehealth: Payer: Self-pay

## 2018-05-13 VITALS — BP 124/68 | HR 74 | Temp 97.9°F | Resp 12 | Ht 71.0 in | Wt 282.2 lb

## 2018-05-13 DIAGNOSIS — I517 Cardiomegaly: Secondary | ICD-10-CM | POA: Diagnosis not present

## 2018-05-13 DIAGNOSIS — I10 Essential (primary) hypertension: Secondary | ICD-10-CM

## 2018-05-13 DIAGNOSIS — R7303 Prediabetes: Secondary | ICD-10-CM

## 2018-05-13 DIAGNOSIS — R739 Hyperglycemia, unspecified: Secondary | ICD-10-CM

## 2018-05-13 DIAGNOSIS — E559 Vitamin D deficiency, unspecified: Secondary | ICD-10-CM

## 2018-05-13 DIAGNOSIS — G4733 Obstructive sleep apnea (adult) (pediatric): Secondary | ICD-10-CM | POA: Diagnosis not present

## 2018-05-13 DIAGNOSIS — E782 Mixed hyperlipidemia: Secondary | ICD-10-CM

## 2018-05-13 DIAGNOSIS — Z5181 Encounter for therapeutic drug level monitoring: Secondary | ICD-10-CM

## 2018-05-13 MED ORDER — KETOCONAZOLE 2 % EX CREA: 1.0000 "application " | TOPICAL_CREAM | Freq: Two times a day (BID) | CUTANEOUS | 3 refills | Status: DC

## 2018-05-13 NOTE — Assessment & Plan Note (Signed)
He is working on weight loss and healthier eating; continue metformin

## 2018-05-13 NOTE — Assessment & Plan Note (Signed)
Controlled today; important to keep under control to help since he has LVH

## 2018-05-13 NOTE — Progress Notes (Signed)
BP 124/68   Pulse 74   Temp 97.9 F (36.6 C) (Oral)   Resp 12   Ht 5\' 11"  (1.803 m)   Wt 282 lb 3.2 oz (128 kg)   SpO2 95%   BMI 39.36 kg/m    Subjective:    [name], male    DOB: 01-Aug-1961, 57 y.o.   MRN: [id number]  [name] is a 57 y.o. male  Chief Complaint  Patient presents with  . Follow-up    HPI  He is here for f/u No medical excitement Saw cardiologist, Dr. Fletcher Anon; talked to him  Higher BPs in the morning; does have sleep apnea; wears the machine a lot of the time HTN; continue medicines Gets home, works out, showers, watches TV and boom, knocked out in the front of the tv and that's when he does not wear the CPAP He realizes issue and will wears the CPAP while watching TV He is working out; hundred rep work-out; four days a week; light weights, different exercises Then he walks after the work-out He dropped to 287 pounds, 282 pounds now Game plan is to get down to 270 pounds He is doing meal preps with [employer]; seems to be good, tries to get away from [employer]; does the microwave; at work, has Animator from [employer] Does not have to read labels Cheats the whole day once in a while Low vitamin D 6 months ago; not taking any extra vitamin D right now Prediabetes Lab Results  Component Value Date   HGBA1C 6.1 (H) 11/12/2017  metformin, no GI upset  Lab Results  Component Value Date   CHOL 115 11/12/2017   HDL 49 11/12/2017   LDLCALC 52 11/12/2017   TRIG 59 11/12/2017   CHOLHDL 2.3 11/12/2017   Knee doctor switched to [place]; working well; bone spur in the left knee, flared up one day; wearing compression stockings and will get back to the vein people    Depression screen [facility] 2/9 05/13/2018 02/11/2018 09/17/2017 08/27/2017 08/13/2017  Decreased Interest 0 0 0 0 0  Down, Depressed, Hopeless 0 0 0 0 0  PHQ - 2 Score 0 0 0 0 0    Relevant past medical, surgical, family and social history reviewed Past  Medical History:  Diagnosis Date  . BPH (benign prostatic hypertrophy)   . Diabetes mellitus without complication ([place])   . ED (erectile dysfunction)   . HTN (hypertension) 04/18/2012  . Hyperlipidemia   . Low testosterone   . Morbidly obese ([place])   . OSA (obstructive sleep apnea)    cpap - does not know settings   . Rotator cuff tear    RT  . Torn Achilles tendon 07/11/2015   Past Surgical History:  Procedure Laterality Date  . COLONOSCOPY WITH PROPOFOL N/A 02/18/2018   Procedure: COLONOSCOPY WITH PROPOFOL;  Surgeon: Jonathon Bellows, MD;  Location: [facility] ENDOSCOPY;  Service: Gastroenterology;  Laterality: N/A;  . DOPPLER ECHOCARDIOGRAPHY  2013  . left kene arthroscopy     . LUMBAR LAMINECTOMY/DECOMPRESSION MICRODISCECTOMY N/A 01/30/2015   Procedure: MICRO LUMBAR DECOMPRESSION, BILATERAL FORAMINOTOMIES, MICRODISCECTOMY LUMBAR FOUR TO LUMBAR FIVE;  Surgeon: Susa Day, MD;  Location: [facility] ORS;  Service: Orthopedics;  Laterality: N/A;  . right knee surgery    . ROTATOR CUFF REPAIR  2007   Left  . SHOULDER OPEN ROTATOR CUFF REPAIR Right 08/08/2015   Procedure: RIGHT SHOULDER MINI OPEN ROTATOR CUFF REPAIR SUBACROMIAL DECOMPRESSION ;  Surgeon: Susa Day,  MD;  Location: [facility] ORS;  Service: Orthopedics;  Laterality: Right;   Family History  Problem Relation Age of Onset  . Dementia Mother   . Hypertension Mother   . Cancer Father        colon  . Heart disease Father   . Congestive Heart Failure Father   . Hypertension Sister   . Dementia Maternal Grandmother   . Kidney disease Neg Hx   . Prostate cancer Neg Hx   . COPD Neg Hx   . Diabetes Neg Hx   . Stroke Neg Hx   . Kidney cancer Neg Hx   . Bladder Cancer Neg Hx    Social History   Tobacco Use  . Smoking status: Former Smoker    Packs/day: 0.50    Years: 30.00    Pack years: 15.00    Types: Cigarettes    Last attempt to quit: 10/19/2004    Years since quitting: 13.5  . Smokeless tobacco: Never Used  Substance Use Topics  .  Alcohol use: Yes    Alcohol/week: 0.0 oz    Comment: occasional  . Drug use: No    Interim medical history since last visit reviewed. Allergies and medications reviewed  Review of Systems Per HPI unless specifically indicated above     Objective:    BP 124/68   Pulse 74   Temp 97.9 F (36.6 C) (Oral)   Resp 12   Ht 5\' 11"  (1.803 m)   Wt 282 lb 3.2 oz (128 kg)   SpO2 95%   BMI 39.36 kg/m   Wt Readings from Last 3 Encounters:  05/13/18 282 lb 3.2 oz (128 kg)  05/06/18 287 lb (130.2 kg)  03/11/18 279 lb 12.8 oz (126.9 kg)    Physical Exam  Constitutional: He appears well-developed and well-nourished. No distress.  HENT:  Head: Normocephalic and atraumatic.  Eyes: EOM are normal. No scleral icterus.  Neck: No thyromegaly present.  Cardiovascular: Normal rate and regular rhythm.  Pulmonary/Chest: Effort normal and breath sounds normal.  Abdominal: Soft. Bowel sounds are normal. He exhibits no distension.  Musculoskeletal: He exhibits no edema (wearing compression stockings).  Neurological: Coordination normal.  Skin: Skin is warm and dry. No pallor.  Psychiatric: He has a normal mood and affect. His behavior is normal. Judgment and thought content normal.      Assessment & Plan:   Problem List Items Addressed This Visit      Cardiovascular and Mediastinum   LVH (left ventricular hypertrophy) - Primary (Chronic)    Encouraged weight loss, BP control; he has just seen cardiologist; important to treat the OSA as well      HTN (hypertension) (Chronic)    Controlled today; important to keep under control to help since he has LVH        Respiratory   OSA (obstructive sleep apnea) (Chronic)    He did his own troubleshooting to figure out a way to improve compliance at night        Other   Medication monitoring encounter   Relevant Orders   COMPLETE METABOLIC PANEL WITH GFR   Hyperglycemia   Hyperlipidemia (Chronic)   Relevant Orders   Lipid panel   Vitamin  D deficiency    Decided to skip checking level and just replace; see AVS      Prediabetes    He is working on weight loss and healthier eating; continue metformin      Relevant Orders   Hemoglobin A1c  Morbid obesity ([place])    BMI >35 with prediabetes, hypertension, LVH, high cholesterol; he is actively working on healthier eating, weight loss          Follow up plan: Return in about 3 months (around 08/13/2018).  An after-visit summary was printed and given to the patient at [facility].  Please see the patient instructions which may contain other information and recommendations beyond what is mentioned above in the assessment and plan.  No orders of the defined types were placed in this encounter.   Orders Placed This Encounter  Procedures  . COMPLETE METABOLIC PANEL WITH GFR  . Hemoglobin A1c  . Lipid panel

## 2018-05-13 NOTE — Patient Instructions (Addendum)
Magnesium 250 mg (mag-ox) daily Start vitamin D3 and you can take 1,000 iu daily Check out the information at familydoctor.org entitled "Nutrition for Weight Loss: What You Need to Know about Fad Diets" Try to lose between 1-2 pounds per week by taking in fewer calories and burning off more calories You can succeed by limiting portions, limiting foods dense in calories and fat, becoming more active, and drinking 8 glasses of water a day (64 ounces) Don't skip meals, especially breakfast, as skipping meals may alter your metabolism Do not use over-the-counter weight loss pills or gimmicks that claim rapid weight loss A healthy BMI (or body mass index) is between 18.5 and 24.9 You can calculate your ideal BMI at the [place] website ClubMonetize.fr Try to follow the DASH guidelines (DASH stands for Dietary Approaches to Stop Hypertension). Try to limit the sodium in your diet to no more than 1,500mg  of sodium per day. Certainly try to not exceed 2,000 mg per day at the very most. Do not add salt when cooking or at the table.  Check the sodium amount on labels when shopping, and choose items lower in sodium when given a choice. Avoid or limit foods that already contain a lot of sodium. Eat a diet rich in fruits and vegetables and whole grains, and try to lose weight if overweight or obese  Keep up the good work!

## 2018-05-13 NOTE — Telephone Encounter (Signed)
Pt needs refill on nizoral 2% cream for rash on arms, he forgot to talk with you about, but you gave it last year to him?

## 2018-05-13 NOTE — Assessment & Plan Note (Signed)
Decided to skip checking level and just replace; see AVS

## 2018-05-13 NOTE — Assessment & Plan Note (Signed)
Encouraged weight loss, BP control; he has just seen cardiologist; important to treat the OSA as well

## 2018-05-13 NOTE — Telephone Encounter (Signed)
Rx sent to [place] We have two different pharmacies in his list, so I hope that was the right one

## 2018-05-13 NOTE — Assessment & Plan Note (Signed)
BMI >35 with prediabetes, hypertension, LVH, high cholesterol; he is actively working on healthier eating, weight loss

## 2018-05-13 NOTE — Assessment & Plan Note (Signed)
He did his own troubleshooting to figure out a way to improve compliance at night

## 2018-05-14 LAB — LIPID PANEL
Cholesterol: 128 mg/dL (ref <200)
HDL: 40 mg/dL — ABNORMAL LOW (ref >40)
LDL Cholesterol (Calc): 73 mg/dL (calc)
Non-HDL Cholesterol (Calc): 88 mg/dL (calc) (ref <130)
Total CHOL/HDL Ratio: 3.2 (calc) (ref <5.0)
Triglycerides: 71 mg/dL (ref <150)

## 2018-05-14 LAB — COMPLETE METABOLIC PANEL WITH GFR
AG RATIO: 2 (calc) (ref 1.0–2.5)
ALT: 27 U/L (ref 9–46)
AST: 25 U/L (ref 10–35)
Albumin: 4.3 g/dL (ref 3.6–5.1)
Alkaline phosphatase (APISO): 68 U/L (ref 40–115)
BILIRUBIN TOTAL: 0.4 mg/dL (ref 0.2–1.2)
BUN: 18 mg/dL (ref 7–25)
CHLORIDE: 103 mmol/L (ref 98–110)
CO2: 29 mmol/L (ref 20–32)
Calcium: 9.7 mg/dL (ref 8.6–10.3)
Creat: 1.1 mg/dL (ref 0.70–1.33)
GFR, EST AFRICAN AMERICAN: 87 mL/min/{1.73_m2} (ref >=60)
GFR, Est Non African American: 75 mL/min/{1.73_m2} (ref >=60)
Globulin: 2.2 g/dL (calc) (ref 1.9–3.7)
Glucose, Bld: 97 mg/dL (ref 65–99)
POTASSIUM: 4.1 mmol/L (ref 3.5–5.3)
Sodium: 139 mmol/L (ref 135–146)
TOTAL PROTEIN: 6.5 g/dL (ref 6.1–8.1)

## 2018-05-14 LAB — HEMOGLOBIN A1C
EAG (MMOL/L): 7 (calc)
Hgb A1c MFr Bld: 6 % of total Hgb — ABNORMAL HIGH (ref <5.7)
MEAN PLASMA GLUCOSE: 126 (calc)

## 2018-05-27 ENCOUNTER — Encounter: Payer: Self-pay | Admitting: Family Medicine

## 2018-06-03 ENCOUNTER — Other Ambulatory Visit: Payer: BLUE CROSS/BLUE SHIELD

## 2018-06-03 ENCOUNTER — Other Ambulatory Visit: Payer: Self-pay

## 2018-06-03 DIAGNOSIS — N400 Enlarged prostate without lower urinary tract symptoms: Secondary | ICD-10-CM

## 2018-06-04 LAB — PSA: PROSTATE SPECIFIC AG, SERUM: 1.2 ng/mL (ref 0.0–4.0)

## 2018-06-10 ENCOUNTER — Ambulatory Visit: Payer: BLUE CROSS/BLUE SHIELD | Admitting: Urology

## 2018-06-16 ENCOUNTER — Encounter

## 2018-06-16 ENCOUNTER — Ambulatory Visit (INDEPENDENT_AMBULATORY_CARE_PROVIDER_SITE_OTHER): Payer: BLUE CROSS/BLUE SHIELD | Admitting: Vascular Surgery

## 2018-06-16 ENCOUNTER — Encounter (INDEPENDENT_AMBULATORY_CARE_PROVIDER_SITE_OTHER): Payer: Self-pay | Admitting: Vascular Surgery

## 2018-06-16 ENCOUNTER — Ambulatory Visit (INDEPENDENT_AMBULATORY_CARE_PROVIDER_SITE_OTHER): Payer: BLUE CROSS/BLUE SHIELD

## 2018-06-16 VITALS — BP 139/84 | HR 64 | Resp 16 | Ht 71.0 in | Wt 290.0 lb

## 2018-06-16 DIAGNOSIS — R600 Localized edema: Secondary | ICD-10-CM

## 2018-06-16 DIAGNOSIS — I890 Lymphedema, not elsewhere classified: Secondary | ICD-10-CM | POA: Diagnosis not present

## 2018-06-16 DIAGNOSIS — R7303 Prediabetes: Secondary | ICD-10-CM | POA: Diagnosis not present

## 2018-06-16 DIAGNOSIS — I10 Essential (primary) hypertension: Secondary | ICD-10-CM

## 2018-06-16 DIAGNOSIS — E782 Mixed hyperlipidemia: Secondary | ICD-10-CM | POA: Diagnosis not present

## 2018-06-16 NOTE — Progress Notes (Signed)
Subjective:    [name], male    DOB: 08/26/1961, 57 y.o.   MRN: [id number] Chief Complaint  Patient presents with  . Follow-up    Reflux study follow up   Patient presents for 66-month follow-up.  Since our initial visit, the patient has been engaging in conservative therapy including wearing medical grade 1 compression socks, elevating his legs and remaining active.  The patient notes this has provided minimal improvement to his symptoms which are bilateral lower extremity edema and discomfort.  The patient notes that this discomfort has progressed to the point that he is unable to function on a daily basis and have become lifestyle limiting.  The patient does a lot of sitting and standing at work and notices that his edema does worsen towards the end of the day.  His pain also progressively worsens as his edema increases.  The patient underwent a bilateral lower extremity venous duplex which was notable for no deep vein thrombosis no superficial venous thrombophlebitis and no venous reflux in the deep or superficial system bilaterally.  The patient denies any claudication-like symptoms, rest pain or ulcer formation to the bilateral lower extremity.  The patient denies any fever, nausea vomiting.  Review of Systems  Constitutional: Negative.   HENT: Negative.   Eyes: Negative.   Respiratory: Negative.   Cardiovascular: Positive for leg swelling.  Gastrointestinal: Negative.   Endocrine: Negative.   Genitourinary: Negative.   Musculoskeletal: Negative.   Skin: Negative.   Allergic/Immunologic: Negative.   Neurological: Negative.   Hematological: Negative.   Psychiatric/Behavioral: Negative.       Objective:   Physical Exam  Constitutional: He is oriented to person, place, and time. He appears well-developed and well-nourished. No distress.  HENT:  Head: Normocephalic and atraumatic.  Right Ear: External ear normal.  Left Ear: External ear normal.  Eyes:  Pupils are equal, round, and reactive to light. Conjunctivae and EOM are normal.  Neck: Normal range of motion.  Cardiovascular: Normal rate, regular rhythm and normal heart sounds.  Pulmonary/Chest: Effort normal and breath sounds normal.  Musculoskeletal: Normal range of motion. He exhibits edema (Mild to moderate nonpitting edema noted bilaterally).  Neurological: He is alert and oriented to person, place, and time.  Skin: Skin is warm and dry. He is not diaphoretic.  Mild stasis dermatitis noted bilaterally.  No fibrosis, cellulitis, active ulcerations noted at this time.  Psychiatric: He has a normal mood and affect. His behavior is normal. Judgment and thought content normal.  Vitals reviewed.  BP 139/84 (BP Location: Right Arm, Patient Position: Sitting)   Pulse 64   Resp 16   Ht 5\' 11"  (1.803 m)   Wt 290 lb (131.5 kg)   BMI 40.45 kg/m   Past Medical History:  Diagnosis Date  . BPH (benign prostatic hypertrophy)   . Diabetes mellitus without complication ([place])   . ED (erectile dysfunction)   . HTN (hypertension) 04/18/2012  . Hyperlipidemia   . Low testosterone   . Morbidly obese ([place])   . OSA (obstructive sleep apnea)    cpap - does not know settings   . Rotator cuff tear    RT  . Torn Achilles tendon 07/11/2015   Social History   Socioeconomic History  . Marital status: Single    Spouse name: Not on file  . Number of children: Not on file  . Years of education: Not on file  . Highest education level: Not on file  Occupational History  Employer: [employer]    Employer: [employer]  Social Needs  . Financial resource strain: Not on file  . Food insecurity:    Worry: Not on file    Inability: Not on file  . Transportation needs:    Medical: Not on file    Non-medical: Not on file  Tobacco Use  . Smoking status: Former Smoker    Packs/day: 0.50    Years: 30.00    Pack years: 15.00    Types: Cigarettes    Last attempt to quit: 10/19/2004    Years since quitting:  13.6  . Smokeless tobacco: Never Used  Substance and Sexual Activity  . Alcohol use: Yes    Alcohol/week: 0.0 standard drinks    Comment: occasional  . Drug use: No  . Sexual activity: Yes    Birth control/protection: None  Lifestyle  . Physical activity:    Days per week: Not on file    Minutes per session: Not on file  . Stress: Not on file  Relationships  . Social connections:    Talks on phone: Not on file    Gets together: Not on file    Attends religious service: Not on file    Active member of club or organization: Not on file    Attends meetings of clubs or organizations: Not on file    Relationship status: Not on file  . Intimate partner violence:    Fear of current or ex partner: Not on file    Emotionally abused: Not on file    Physically abused: Not on file    Forced sexual activity: Not on file  Other Topics Concern  . Not on file  Social History Narrative  . Not on file   Past Surgical History:  Procedure Laterality Date  . COLONOSCOPY WITH PROPOFOL N/A 02/18/2018   Procedure: COLONOSCOPY WITH PROPOFOL;  Surgeon: Jonathon Bellows, MD;  Location: [facility] ENDOSCOPY;  Service: Gastroenterology;  Laterality: N/A;  . DOPPLER ECHOCARDIOGRAPHY  2013  . left kene arthroscopy     . LUMBAR LAMINECTOMY/DECOMPRESSION MICRODISCECTOMY N/A 01/30/2015   Procedure: MICRO LUMBAR DECOMPRESSION, BILATERAL FORAMINOTOMIES, MICRODISCECTOMY LUMBAR FOUR TO LUMBAR FIVE;  Surgeon: Susa Day, MD;  Location: [facility] ORS;  Service: Orthopedics;  Laterality: N/A;  . right knee surgery    . ROTATOR CUFF REPAIR  2007   Left  . SHOULDER OPEN ROTATOR CUFF REPAIR Right 08/08/2015   Procedure: RIGHT SHOULDER MINI OPEN ROTATOR CUFF REPAIR SUBACROMIAL DECOMPRESSION ;  Surgeon: Susa Day, MD;  Location: [facility] ORS;  Service: Orthopedics;  Laterality: Right;   Family History  Problem Relation Age of Onset  . Dementia Mother   . Hypertension Mother   . Cancer Father        colon  . Heart disease Father     . Congestive Heart Failure Father   . Hypertension Sister   . Dementia Maternal Grandmother   . Kidney disease Neg Hx   . Prostate cancer Neg Hx   . COPD Neg Hx   . Diabetes Neg Hx   . Stroke Neg Hx   . Kidney cancer Neg Hx   . Bladder Cancer Neg Hx    No Known Allergies     Assessment & Plan:  Patient presents for 57-month follow-up.  Since our initial visit, the patient has been engaging in conservative therapy including wearing medical grade 1 compression socks, elevating his legs and remaining active.  The patient notes this has provided minimal improvement to his symptoms  which are bilateral lower extremity edema and discomfort.  The patient notes that this discomfort has progressed to the point that he is unable to function on a daily basis and have become lifestyle limiting.  The patient does a lot of sitting and standing at work and notices that his edema does worsen towards the end of the day.  His pain also progressively worsens as his edema increases.  The patient underwent a bilateral lower extremity venous duplex which was notable for no deep vein thrombosis no superficial venous thrombophlebitis and no venous reflux in the deep or superficial system bilaterally.  The patient denies any claudication-like symptoms, rest pain or ulcer formation to the bilateral lower extremity.  The patient denies any fever, nausea vomiting.  1. Lymphedema - New Despite conservative treatments including exercise, elevation and class I compression stockings the patient still presents with stage I lymphedema. The patient's symptoms have progressed to the point that he is unable to function on a daily basis and feels that they have become lifestyle limiting No chronic venous insufficiency was noted to either the deep or superficial system on today's duplex The patient would greatly benefit from the added therapy of a lymphedema pump. I will applied to the patient's insurance Patient is to follow-up in  6 months for lymphedema follow-up  2. Prediabetes - Stable Encouraged good control as its slows the progression of atherosclerotic disease  3. Essential hypertension - Stable Encouraged good control as its slows the progression of atherosclerotic disease  4. Mixed hyperlipidemia - Stable Encouraged good control as its slows the progression of atherosclerotic disease  Current Outpatient Medications on File Prior to Visit  Medication Sig Dispense Refill  . amLODipine (NORVASC) 2.5 MG tablet Take 1 tablet (2.5 mg total) by mouth daily. 90 tablet 3  . aspirin EC 81 MG tablet Take 1 tablet (81 mg total) by mouth every morning. Resume 4 days post-op    . carvedilol (COREG) 6.25 MG tablet Take 6.25 mg by mouth 2 (two) times daily.  3  . hydrochlorothiazide (HYDRODIURIL) 12.5 MG tablet Take 1 tablet (12.5 mg total) by mouth daily. 30 tablet 5  . ketoconazole (NIZORAL) 2 % cream Apply 1 application topically 2 (two) times daily. 45 g 3  . losartan (COZAAR) 100 MG tablet TAKE 1 TABLET (100 MG TOTAL) BY MOUTH DAILY. 90 tablet 3  . meloxicam (MOBIC) 15 MG tablet meloxicam 15 mg tablet    . metFORMIN (GLUCOPHAGE-XR) 500 MG 24 hr tablet Take 2 tablets (1,000 mg total) by mouth daily with breakfast. 60 tablet 5  . Multiple Vitamin (MULTIVITAMIN) tablet Take 1 tablet by mouth daily.    . Naproxen-Esomeprazole (VIMOVO) 500-20 MG TBEC Vimovo 500 mg-20 mg tablet,immediate and delay release  Take 1 tablet twice a day by oral route.    . sildenafil (REVATIO) 20 MG tablet Take 1 tablet (20 mg total) by mouth 5 (five) times daily as needed. Take 2 to 5 tablets 30 minutes prior to intercourse on an empty stomach 50 tablet 11   No current facility-administered medications on file prior to visit.    There are no Patient Instructions on file for this visit. No follow-ups on file.  Asif Muchow A Skylynne Schlechter, PA-C

## 2018-06-17 ENCOUNTER — Other Ambulatory Visit: Payer: Self-pay | Admitting: Family Medicine

## 2018-06-17 NOTE — Telephone Encounter (Signed)
Last OV:7/26   Next:10/25

## 2018-06-24 ENCOUNTER — Encounter (INDEPENDENT_AMBULATORY_CARE_PROVIDER_SITE_OTHER): Payer: Self-pay

## 2018-07-01 ENCOUNTER — Ambulatory Visit (INDEPENDENT_AMBULATORY_CARE_PROVIDER_SITE_OTHER): Payer: BLUE CROSS/BLUE SHIELD | Admitting: Urology

## 2018-07-01 ENCOUNTER — Encounter: Payer: Self-pay | Admitting: Urology

## 2018-07-01 VITALS — BP 126/75 | HR 67 | Ht 71.0 in | Wt 285.0 lb

## 2018-07-01 DIAGNOSIS — N138 Other obstructive and reflux uropathy: Secondary | ICD-10-CM

## 2018-07-01 DIAGNOSIS — N401 Enlarged prostate with lower urinary tract symptoms: Secondary | ICD-10-CM

## 2018-07-01 DIAGNOSIS — N529 Male erectile dysfunction, unspecified: Secondary | ICD-10-CM

## 2018-07-01 MED ORDER — SILDENAFIL CITRATE 20 MG PO TABS: 20.0000 mg | ORAL_TABLET | Freq: Every day | ORAL | 11 refills | Status: DC | PRN

## 2018-07-01 MED ORDER — TADALAFIL 5 MG PO TABS: 5.0000 mg | ORAL_TABLET | Freq: Every day | ORAL | 3 refills | Status: DC | PRN

## 2018-07-01 NOTE — Progress Notes (Addendum)
9:46 AM   Marc Stevens Jun 08, 1961 [id number]  Referring provider: Arnetha Courser, MD [address] [place], [place] [address]  Chief Complaint  Patient presents with  . Erectile Dysfunction    1year    HPI: Patient is a 57-year-old African-American male with erectile dysfunction, and mild BPH with LUTS who presents today for 1 year follow-up.  ED His SHIM score is 13, which is mild to moderate ED.  His previous SHIM was 13.  His major complaint is maintaining an erection.     His risk factors for ED are HTN, sleep apnea, BPH, obesity, hyperlipidemia and age.  He denies any painful erections or curvatures with his erections.   He has tried PDE5-inhibitors in the past with success.   SHIM    Row Name 07/01/18 0904         SHIM: Over the last 6 months:   How do you rate your confidence that you could get and keep an erection?  Low     When you had erections with sexual stimulation, how often were your erections hard enough for penetration (entering your partner)?  Sometimes (about half the time)     During sexual intercourse, how often were you able to maintain your erection after you had penetrated (entered) your partner?  Sometimes (about half the time)     During sexual intercourse, how difficult was it to maintain your erection to completion of intercourse?  Difficult     When you attempted sexual intercourse, how often was it satisfactory for you?  A Few Times (much less than half the time)       SHIM Total Score   SHIM  13        Score: 1-7 Severe ED 8-11 Moderate ED 12-16 Mild-Moderate ED 17-21 Mild ED 22-25 No ED  BPH with LU TS No  PMH: Past Medical History:  Diagnosis Date  . BPH (benign prostatic hypertrophy)   . Diabetes mellitus without complication ([place])   . ED (erectile dysfunction)   . HTN (hypertension) 04/18/2012  . Hyperlipidemia   . Low testosterone   . Morbidly obese ([place])   . OSA (obstructive sleep apnea)    cpap - does  not know settings   . Rotator cuff tear    RT  . Torn Achilles tendon 07/11/2015    Surgical History: Past Surgical History:  Procedure Laterality Date  . COLONOSCOPY WITH PROPOFOL N/A 02/18/2018   Procedure: COLONOSCOPY WITH PROPOFOL;  Surgeon: Jonathon Bellows, MD;  Location: [facility] ENDOSCOPY;  Service: Gastroenterology;  Laterality: N/A;  . DOPPLER ECHOCARDIOGRAPHY  2013  . left kene arthroscopy     . LUMBAR LAMINECTOMY/DECOMPRESSION MICRODISCECTOMY N/A 01/30/2015   Procedure: MICRO LUMBAR DECOMPRESSION, BILATERAL FORAMINOTOMIES, MICRODISCECTOMY LUMBAR FOUR TO LUMBAR FIVE;  Surgeon: Susa Day, MD;  Location: [facility] ORS;  Service: Orthopedics;  Laterality: N/A;  . right knee surgery    . ROTATOR CUFF REPAIR  2007   Left  . SHOULDER OPEN ROTATOR CUFF REPAIR Right 08/08/2015   Procedure: RIGHT SHOULDER MINI OPEN ROTATOR CUFF REPAIR SUBACROMIAL DECOMPRESSION ;  Surgeon: Susa Day, MD;  Location: [facility] ORS;  Service: Orthopedics;  Laterality: Right;    Home Medications:  Allergies as of 07/01/2018   No Known Allergies     Medication List        Accurate as of 07/01/18  9:46 AM. Always use your most recent med list.          amLODipine  2.5 MG tablet Commonly known as:  NORVASC Take 1 tablet (2.5 mg total) by mouth daily.   aspirin EC 81 MG tablet Take 1 tablet (81 mg total) by mouth every morning. Resume 4 days post-op   carvedilol 6.25 MG tablet Commonly known as:  COREG Take 6.25 mg by mouth 2 (two) times daily.   hydrochlorothiazide 12.5 MG tablet Commonly known as:  HYDRODIURIL Take 1 tablet (12.5 mg total) by mouth daily.   ketoconazole 2 % cream Commonly known as:  NIZORAL Apply 1 application topically 2 (two) times daily.   losartan 100 MG tablet Commonly known as:  COZAAR TAKE 1 TABLET (100 MG TOTAL) BY MOUTH DAILY.   meloxicam 15 MG tablet Commonly known as:  MOBIC meloxicam 15 mg tablet   metFORMIN 500 MG 24 hr tablet Commonly known as:  GLUCOPHAGE-XR TAKE 2  TABLETS BY MOUTH EVERY DAY WITH BREAKFAST   multivitamin tablet Take 1 tablet by mouth daily.   sildenafil 20 MG tablet Commonly known as:  REVATIO Take 1 tablet (20 mg total) by mouth 5 (five) times daily as needed. Take 2 to 5 tablets 30 minutes prior to intercourse on an empty stomach   tadalafil 5 MG tablet Commonly known as:  CIALIS Take 1 tablet (5 mg total) by mouth daily as needed for erectile dysfunction.   VIMOVO 500-20 MG Tbec Generic drug:  Naproxen-Esomeprazole Vimovo 500 mg-20 mg tablet,immediate and delay release  Take 1 tablet twice a day by oral route.       Allergies: No Known Allergies  Family History: Family History  Problem Relation Age of Onset  . Dementia Mother   . Hypertension Mother   . Cancer Father        colon  . Heart disease Father   . Congestive Heart Failure Father   . Hypertension Sister   . Dementia Maternal Grandmother   . Kidney disease Neg Hx   . Prostate cancer Neg Hx   . COPD Neg Hx   . Diabetes Neg Hx   . Stroke Neg Hx   . Kidney cancer Neg Hx   . Bladder Cancer Neg Hx     Social History:  reports that he quit smoking about 13 years ago. His smoking use included cigarettes. He has a 15.00 pack-year smoking history. He has never used smokeless tobacco. He reports that he drinks alcohol. He reports that he does not use drugs.  ROS: UROLOGY Frequent Urination?: No Hard to postpone urination?: No Burning/pain with urination?: No Get up at night to urinate?: No Leakage of urine?: No Urine stream starts and stops?: No Trouble starting stream?: No Do you have to strain to urinate?: No Blood in urine?: No Urinary tract infection?: No Sexually transmitted disease?: No Injury to kidneys or bladder?: No Painful intercourse?: No Weak stream?: No Erection problems?: Yes Penile pain?: No  Gastrointestinal Nausea?: No Vomiting?: No Indigestion/heartburn?: No Diarrhea?: No Constipation?: No  Constitutional Fever:  No Night sweats?: No Weight loss?: No Fatigue?: No  Skin Skin rash/lesions?: No Itching?: No  Eyes Blurred vision?: No Double vision?: No  Ears/Nose/Throat Sore throat?: No Sinus problems?: No  Hematologic/Lymphatic Swollen glands?: No Easy bruising?: No  Cardiovascular Leg swelling?: No Chest pain?: No  Respiratory Cough?: No Shortness of breath?: No  Endocrine Excessive thirst?: No  Musculoskeletal Back pain?: Yes Joint pain?: Yes  Neurological Headaches?: No Dizziness?: No  Psychologic Depression?: No Anxiety?: No  Physical Exam: Blood pressure 126/75, pulse 67, height 5\' 11"  (1.803  m), weight 285 lb (129.3 kg). Constitutional: Well nourished. Alert and oriented, No acute distress. HEENT: [place] AT, moist mucus membranes. Trachea midline, no masses. Cardiovascular: No clubbing, cyanosis, or edema. Respiratory: Normal respiratory effort, no increased work of breathing. GI: Abdomen is soft, non tender, non distended, no abdominal masses. Liver and spleen not palpable.  No hernias appreciated.  Stool sample for occult testing is not indicated.   GU: No CVA tenderness.  No bladder fullness or masses.  Patient with circumcised phallus.   Urethral meatus is patent.  No penile discharge. No penile lesions or rashes. Scrotum without lesions, cysts, rashes and/or edema.  Testicles are located scrotally bilaterally. No masses are appreciated in the testicles. Left and right epididymis are normal. Rectal: Patient with  normal sphincter tone. Anus and perineum without scarring or rashes. No rectal masses are appreciated. Prostate is approximately 50 grams, could only palpate the apex, no nodules are appreciated.  Skin: No rashes, bruises or suspicious lesions. Lymph: No cervical or inguinal adenopathy. Neurologic: Grossly intact, no focal deficits, moving all 4 extremities. Psychiatric: Normal mood and affect.   Laboratory Data: PSA Trend  1.8 in 02/2013  1.2 in  02/2016  1.4 in 05/2016  1.39 in 05/2017 Results for orders placed or performed in visit on 06/03/18  PSA  Result Value Ref Range   Prostate Specific Ag, Serum 1.2 0.0 - 4.0 ng/mL    Lab Results  Component Value Date   CREATININE 1.10 05/13/2018    I have reviewed the labs  Assessment & Plan:    1. Erectile dysfunction SHIM score is 13, it is stable Continue sildenafil - refills given  Will also give a script for tadalafil 5 mg daily to see if it is more effective   2 . BPH with LU TS RTC in one year for PSA and exam   Return in about 1 year (around 07/02/2019) for SHIM, PSA and exam.  Zara Council, [facility]  [address] [place], [place] [address] [phone]

## 2018-07-11 ENCOUNTER — Encounter (INDEPENDENT_AMBULATORY_CARE_PROVIDER_SITE_OTHER): Payer: Self-pay

## 2018-08-07 ENCOUNTER — Other Ambulatory Visit: Payer: Self-pay | Admitting: Family Medicine

## 2018-08-08 NOTE — Telephone Encounter (Signed)
Reviewed last Cr and K+ Rx approved 

## 2018-08-12 ENCOUNTER — Other Ambulatory Visit: Payer: Self-pay | Admitting: Urology

## 2018-08-12 ENCOUNTER — Encounter: Payer: Self-pay | Admitting: Family Medicine

## 2018-08-12 ENCOUNTER — Ambulatory Visit: Payer: BLUE CROSS/BLUE SHIELD | Admitting: Family Medicine

## 2018-08-12 VITALS — BP 126/76 | HR 83 | Temp 98.3°F | Ht 71.0 in | Wt 284.4 lb

## 2018-08-12 DIAGNOSIS — R7303 Prediabetes: Secondary | ICD-10-CM | POA: Diagnosis not present

## 2018-08-12 DIAGNOSIS — M25551 Pain in right hip: Secondary | ICD-10-CM | POA: Diagnosis not present

## 2018-08-12 DIAGNOSIS — I890 Lymphedema, not elsewhere classified: Secondary | ICD-10-CM

## 2018-08-12 DIAGNOSIS — I517 Cardiomegaly: Secondary | ICD-10-CM

## 2018-08-12 DIAGNOSIS — G4733 Obstructive sleep apnea (adult) (pediatric): Secondary | ICD-10-CM

## 2018-08-12 DIAGNOSIS — I10 Essential (primary) hypertension: Secondary | ICD-10-CM

## 2018-08-12 MED ORDER — HYDROCHLOROTHIAZIDE 25 MG PO TABS: 25.0000 mg | ORAL_TABLET | Freq: Every day | ORAL | 0 refills | Status: DC

## 2018-08-12 MED ORDER — SILDENAFIL CITRATE 20 MG PO TABS: ORAL_TABLET | ORAL | Status: DC

## 2018-08-12 MED ORDER — TADALAFIL 5 MG PO TABS: 5.0000 mg | ORAL_TABLET | Freq: Every day | ORAL | 0 refills | Status: DC | PRN

## 2018-08-12 NOTE — Assessment & Plan Note (Signed)
Wearing compression stockings; did not increase CCB

## 2018-08-12 NOTE — Assessment & Plan Note (Signed)
No labs due today; weight loss encouraged

## 2018-08-12 NOTE — Progress Notes (Signed)
Rx for Cialis sent to [facility]

## 2018-08-12 NOTE — Progress Notes (Signed)
BP 126/76   Pulse 83   Temp 98.3 F (36.8 C) (Oral)   Ht 5\' 11"  (1.803 m)   Wt 284 lb 6.4 oz (129 kg)   SpO2 99%   BMI 39.67 kg/m    Subjective:    [name], male    DOB: [birth year], 57 y.o.   MRN: [id number]  [name] is a 57 y.o. male  Chief Complaint  Patient presents with  . Follow-up    HPI Patient is here for follow-up  He has high blood pressure; on amlodipine and HCTZ and losartan He checks BP at home every single day; last week numbers have been high, 160/88 yesterday morning; 145/82 at home this morning; trying to stay away from salt, french fries; he will ask for no salt fries; weaning off of sweet tea No NSAIDs; no decongestants No hx of gout Wearing compression stockings for his lymphedema  He is down 6 pounds over the last 2 months He cheated the last month; he is going to get back to where he wants to be; staying with meal prep; doesn't cheat at work; more like an apple; not the sugary snacks, just an apple He found a natural source of zero carbs, natural energy Brought in [place]. Water  Prediabetes; no dry mouth; mother did have DM, maybe dad, one of them  Lab Results  Component Value Date   HGBA1C 6.0 (H) 05/13/2018   He has meloxicam in the med list as well as naproxen; only taking the naproxen product  OSA; seeing specialist  Having pain over the RIGHT superior hip; using cold spray and numbing patch; going to see ortho today; few months duration; localized; no shooting pain, no weakness down the leg  Depression screen [facility] 2/9 08/12/2018 05/13/2018 02/11/2018 09/17/2017 08/27/2017  Decreased Interest 0 0 0 0 0  Down, Depressed, Hopeless 0 0 0 0 0  PHQ - 2 Score 0 0 0 0 0  Altered sleeping 0 - - - -  Tired, decreased energy 0 - - - -  Change in appetite 0 - - - -  Feeling bad or failure about yourself  0 - - - -  Trouble concentrating 0 - - - -  Moving slowly or fidgety/restless 0 - - - -  Suicidal  thoughts 0 - - - -  PHQ-9 Score 0 - - - -  Difficult doing work/chores Not difficult at all - - - -   Fall Risk  08/12/2018 05/13/2018 05/13/2018 02/11/2018 09/17/2017  Falls in the past year? No No No No No    Relevant past medical, surgical, family and social history reviewed Past Medical History:  Diagnosis Date  . BPH (benign prostatic hypertrophy)   . Diabetes mellitus without complication ([place])   . ED (erectile dysfunction)   . HTN (hypertension) 04/18/2012  . Hyperlipidemia   . Low testosterone   . Morbidly obese ([place])   . OSA (obstructive sleep apnea)    cpap - does not know settings   . Rotator cuff tear    RT  . Torn Achilles tendon 07/11/2015   Past Surgical History:  Procedure Laterality Date  . COLONOSCOPY WITH PROPOFOL N/A 02/18/2018   Procedure: COLONOSCOPY WITH PROPOFOL;  Surgeon: Jonathon Bellows, MD;  Location: [facility] ENDOSCOPY;  Service: Gastroenterology;  Laterality: N/A;  . DOPPLER ECHOCARDIOGRAPHY  2013  . left kene arthroscopy     . LUMBAR LAMINECTOMY/DECOMPRESSION MICRODISCECTOMY N/A 01/30/2015   Procedure: MICRO LUMBAR DECOMPRESSION,  BILATERAL FORAMINOTOMIES, MICRODISCECTOMY LUMBAR FOUR TO LUMBAR FIVE;  Surgeon: Susa Day, MD;  Location: [facility] ORS;  Service: Orthopedics;  Laterality: N/A;  . right knee surgery    . ROTATOR CUFF REPAIR  2007   Left  . SHOULDER OPEN ROTATOR CUFF REPAIR Right 08/08/2015   Procedure: RIGHT SHOULDER MINI OPEN ROTATOR CUFF REPAIR SUBACROMIAL DECOMPRESSION ;  Surgeon: Susa Day, MD;  Location: [facility] ORS;  Service: Orthopedics;  Laterality: Right;   Family History  Problem Relation Age of Onset  . Dementia Mother   . Hypertension Mother   . Cancer Father        colon  . Heart disease Father   . Congestive Heart Failure Father   . Hypertension Sister   . Dementia Maternal Grandmother   . Kidney disease Neg Hx   . Prostate cancer Neg Hx   . COPD Neg Hx   . Diabetes Neg Hx   . Stroke Neg Hx   . Kidney cancer Neg Hx   . Bladder  Cancer Neg Hx    Social History   Tobacco Use  . Smoking status: Former Smoker    Packs/day: 0.50    Years: 30.00    Pack years: 15.00    Types: Cigarettes    Last attempt to quit: 10/19/2004    Years since quitting: 13.8  . Smokeless tobacco: Never Used  Substance Use Topics  . Alcohol use: Yes    Alcohol/week: 0.0 standard drinks    Comment: occasional  . Drug use: No     [facility] Visit from 08/12/2018 in [facility]  AUDIT-C Score  3      Interim medical history since last visit reviewed. Allergies and medications reviewed  Review of Systems Per HPI unless specifically indicated above     Objective:    BP 126/76   Pulse 83   Temp 98.3 F (36.8 C) (Oral)   Ht 5\' 11"  (1.803 m)   Wt 284 lb 6.4 oz (129 kg)   SpO2 99%   BMI 39.67 kg/m   Wt Readings from Last 3 Encounters:  08/12/18 284 lb 6.4 oz (129 kg)  07/01/18 285 lb (129.3 kg)  06/16/18 290 lb (131.5 kg)    Physical Exam  Constitutional: He appears well-developed and well-nourished. No distress.  Obese male  HENT:  Head: Normocephalic and atraumatic.  Eyes: EOM are normal. No scleral icterus.  Neck: No thyromegaly present.  Cardiovascular: Normal rate and regular rhythm.  Pulmonary/Chest: Effort normal and breath sounds normal.  Abdominal: Soft. Bowel sounds are normal. He exhibits no distension.  Musculoskeletal: He exhibits no edema (wearing compression stockings).       Right hip: He exhibits normal range of motion, no bony tenderness, no crepitus and no deformity.  Mild tenderness just over the RIGHT superior iliac crest; no tumor felt; tenderness when he bends laterally tot he right  Neurological: Coordination normal.  Plantar flexion and dorsiflexion 5/5; hip flexion 5/5, leg extension 5/5 on the RIGHT  Skin: Skin is warm and dry. No pallor.  Psychiatric: He has a normal mood and affect. His mood appears not anxious. He does not exhibit a depressed mood.    Results for orders  placed or performed in visit on 06/03/18  PSA  Result Value Ref Range   Prostate Specific Ag, Serum 1.2 0.0 - 4.0 ng/mL      Assessment & Plan:   Problem List Items Addressed This Visit      Cardiovascular and  Mediastinum   LVH (left ventricular hypertrophy) (Chronic)    Work on weight loss and BP reduction      Relevant Medications   hydrochlorothiazide (HYDRODIURIL) 25 MG tablet   sildenafil (REVATIO) 20 MG tablet   HTN (hypertension) - Primary (Chronic)    Increase HCTZ; try to follow DASH guidelines; as weight comes down, will decrease HCTZ back to 12.5 mg daily      Relevant Medications   hydrochlorothiazide (HYDRODIURIL) 25 MG tablet   sildenafil (REVATIO) 20 MG tablet     Respiratory   OSA (obstructive sleep apnea) (Chronic)    Managed by specialist        Other   Prediabetes    No labs due today; weight loss encouraged      Morbid obesity ([place])    Encouraged patient to work on weight loss; he sounds motivated to get back to losing weight; as he loses weight, I am hopeful that we can reduce some of his BP medicine      Lymphedema    Wearing compression stockings; did not increase CCB       Other Visit Diagnoses    Right hip pain       he'll be seeing ortho today; I do believe this is musculoskeletal as this is nowhere near his liver or kidney       Follow up plan: Return in about 3 months (around 11/14/2018) for follow-up visit with Dr. Sanda Klein (or just AFTER).  An after-visit summary was printed and given to the patient at [facility].  Please see the patient instructions which may contain other information and recommendations beyond what is mentioned above in the assessment and plan.  Meds ordered this encounter  Medications  . hydrochlorothiazide (HYDRODIURIL) 25 MG tablet    Sig: Take 1 tablet (25 mg total) by mouth daily.    Dispense:  90 tablet    Refill:  0    We're increasing the dose  . sildenafil (REVATIO) 20 MG tablet    Sig: Take 2 to 5  tablets 30 minutes prior to intercourse on an empty stomach    No orders of the defined types were placed in this encounter.

## 2018-08-12 NOTE — Assessment & Plan Note (Signed)
Increase HCTZ; try to follow DASH guidelines; as weight comes down, will decrease HCTZ back to 12.5 mg daily

## 2018-08-12 NOTE — Assessment & Plan Note (Signed)
Encouraged patient to work on weight loss; he sounds motivated to get back to losing weight; as he loses weight, I am hopeful that we can reduce some of his BP medicine

## 2018-08-12 NOTE — Patient Instructions (Signed)
Please do take your aspirin at least one hour PRIOR to the vimovo (naproxen-PPI) dose Increase the HCTZ to 25 mg daily Check your blood pressure at home and notify me if not consistently under 140 on top

## 2018-08-12 NOTE — Assessment & Plan Note (Signed)
Managed by specialist 

## 2018-08-12 NOTE — Assessment & Plan Note (Signed)
Work on weight loss and BP reduction

## 2018-08-15 ENCOUNTER — Encounter

## 2018-08-15 ENCOUNTER — Ambulatory Visit (INDEPENDENT_AMBULATORY_CARE_PROVIDER_SITE_OTHER): Payer: BLUE CROSS/BLUE SHIELD | Admitting: Vascular Surgery

## 2018-08-15 ENCOUNTER — Encounter (INDEPENDENT_AMBULATORY_CARE_PROVIDER_SITE_OTHER): Payer: BLUE CROSS/BLUE SHIELD

## 2018-08-28 ENCOUNTER — Other Ambulatory Visit: Payer: Self-pay | Admitting: Family Medicine

## 2018-08-28 NOTE — Telephone Encounter (Signed)
HCTZ 12.5 mg requested This is not what he is supposed to be taking I increased HCTZ to 25 mg daily and sent a 90 day supply on 08/12/18 Please resolve with pharmacy

## 2018-08-29 ENCOUNTER — Encounter: Payer: Self-pay | Admitting: Family Medicine

## 2018-08-29 NOTE — Telephone Encounter (Signed)
Left voicemail with pharmacy 

## 2018-09-06 ENCOUNTER — Other Ambulatory Visit: Payer: Self-pay

## 2018-09-07 MED ORDER — HYDROCHLOROTHIAZIDE 25 MG PO TABS: 25.0000 mg | ORAL_TABLET | Freq: Every day | ORAL | 0 refills | Status: DC

## 2018-09-07 NOTE — Telephone Encounter (Signed)
BMP Latest Ref Rng & Units 05/13/2018 06/29/2017 03/12/2017  Glucose 65 - 99 mg/dL 97 98 105(H)  BUN 7 - 25 mg/dL 18 21 15   Creatinine 0.70 - 1.33 mg/dL 1.10 1.12 1.08  BUN/Creat Ratio 6 - 22 (calc) NOT APPLICABLE NOT APPLICABLE -  Sodium 025 - 146 mmol/L 139 141 142  Potassium 3.5 - 5.3 mmol/L 4.1 3.9 3.8  Chloride 98 - 110 mmol/L 103 105 105  CO2 20 - 32 mmol/L 29 28 25   Calcium 8.6 - 10.3 mg/dL 9.7 9.6 9.4   Okay to refill

## 2018-09-13 ENCOUNTER — Other Ambulatory Visit: Payer: Self-pay | Admitting: Nurse Practitioner

## 2018-09-13 NOTE — Telephone Encounter (Signed)
Reviewed last a1c and Cr rx approved

## 2018-10-26 ENCOUNTER — Other Ambulatory Visit: Payer: Self-pay | Admitting: Family Medicine

## 2018-10-27 NOTE — Telephone Encounter (Signed)
Lab Results  Component Value Date   CREATININE 1.10 05/13/2018   Lab Results  Component Value Date   HGBA1C 6.0 (H) 05/13/2018

## 2018-11-18 ENCOUNTER — Ambulatory Visit (INDEPENDENT_AMBULATORY_CARE_PROVIDER_SITE_OTHER): Payer: BLUE CROSS/BLUE SHIELD | Admitting: Family Medicine

## 2018-11-18 ENCOUNTER — Encounter: Payer: Self-pay | Admitting: Family Medicine

## 2018-11-18 VITALS — BP 128/78 | HR 73 | Temp 98.1°F | Ht 71.0 in | Wt 290.9 lb

## 2018-11-18 DIAGNOSIS — R739 Hyperglycemia, unspecified: Secondary | ICD-10-CM | POA: Diagnosis not present

## 2018-11-18 DIAGNOSIS — Z5181 Encounter for therapeutic drug level monitoring: Secondary | ICD-10-CM

## 2018-11-18 DIAGNOSIS — I10 Essential (primary) hypertension: Secondary | ICD-10-CM

## 2018-11-18 DIAGNOSIS — R7303 Prediabetes: Secondary | ICD-10-CM | POA: Diagnosis not present

## 2018-11-18 DIAGNOSIS — G629 Polyneuropathy, unspecified: Secondary | ICD-10-CM | POA: Diagnosis not present

## 2018-11-18 DIAGNOSIS — E782 Mixed hyperlipidemia: Secondary | ICD-10-CM

## 2018-11-18 DIAGNOSIS — E559 Vitamin D deficiency, unspecified: Secondary | ICD-10-CM

## 2018-11-18 DIAGNOSIS — G4733 Obstructive sleep apnea (adult) (pediatric): Secondary | ICD-10-CM

## 2018-11-18 MED ORDER — CARVEDILOL 12.5 MG PO TABS: 12.5000 mg | ORAL_TABLET | Freq: Two times a day (BID) | ORAL | 3 refills | Status: DC

## 2018-11-18 NOTE — Assessment & Plan Note (Signed)
Check A1c. 

## 2018-11-18 NOTE — Assessment & Plan Note (Signed)
Try to follow DASH guidelines

## 2018-11-18 NOTE — Progress Notes (Signed)
BP 128/78   Pulse 73   Temp 98.1 F (36.7 C)   Ht 5\' 11"  (1.803 m)   Wt 290 lb 14.4 oz (132 kg)   SpO2 99%   BMI 40.57 kg/m    Subjective:    [name], male    DOB: June 05, 1961, 58 y.o.   MRN: [id number]  [name] is a 58 y.o. male  Chief Complaint  Patient presents with  . Follow-up    HPI Patient is here for follow-up  Hypertension; fair control today; checking away from doctor but not sitting down for very long; waits a few minutes now and relaxed; little better when relaxed; less stress Salt avoidance as much as possible Checking and getting 130s and 140s No headaches or chest pain No NSAIDs; no decongestants  Sleep apnea Not using machine like he should; he is trying to do a new regimen of going to bed earlier, by 10 pm; gets up same time; uses machine maybe 4 times over the last month, slack he admits; he can feel a difference when uses it  Hyperglycemia/prediabetes; on metformin; no dry mouth; no blurred vision; just readers  Lab Results  Component Value Date   HGBA1C 6.2 (H) 11/18/2018   Hyperlipidemia; gentleman out of [place] was doing meal preps; talking about putting salmon in them; ate a lot of grilled chicken and green leafy stuff; lost more weight from that; will try eating on his own for 2-3 months and lose weight on his own; if not getting results, then revamp; he is working with a nutritionist; good water drinker; walking mostly and about to get back into max training; start slow and build up gradually; taking aspirin; will start slow; breakfast is 2 egg whites and 2 regular eggs and a bowl of oatmeal; or it might be grilled chicken; not much cheese  Lab Results  Component Value Date   CHOL 139 11/18/2018   HDL 46 11/18/2018   [place] 78 11/18/2018   TRIG 71 11/18/2018   CHOLHDL 3.0 11/18/2018   For 3 weeks, he has had a burning sensation in the feet; nervous about that, friends with diabetes had some toes  removed; hopefully getting better; no worse, moves his feet around Morbid obesity; he is reading books on weight loss  He brought in CBD, [facility]; $52, working for his shoulders and back; working well, relieves pain quickly  Depression screen [facility] 2/9 11/18/2018 08/12/2018 05/13/2018 02/11/2018 09/17/2017  Decreased Interest 0 0 0 0 0  Down, Depressed, Hopeless 0 0 0 0 0  PHQ - 2 Score 0 0 0 0 0  Altered sleeping 0 0 - - -  Tired, decreased energy 0 0 - - -  Change in appetite 0 0 - - -  Feeling bad or failure about yourself  0 0 - - -  Trouble concentrating 0 0 - - -  Moving slowly or fidgety/restless 0 0 - - -  Suicidal thoughts 0 0 - - -  PHQ-9 Score 0 0 - - -  Difficult doing work/chores Not difficult at all Not difficult at all - - -   Fall Risk  11/18/2018 08/12/2018 05/13/2018 05/13/2018 02/11/2018  Falls in the past year? 0 No No No No    Relevant past medical, surgical, family and social history reviewed Past Medical History:  Diagnosis Date  . BPH (benign prostatic hypertrophy)   . Diabetes mellitus without complication ([place])   . ED (erectile dysfunction)   .  HTN (hypertension) 04/18/2012  . Hyperlipidemia   . Low testosterone   . Morbidly obese ([place])   . OSA (obstructive sleep apnea)    cpap - does not know settings   . Rotator cuff tear    RT  . Torn Achilles tendon 07/11/2015   Past Surgical History:  Procedure Laterality Date  . COLONOSCOPY WITH PROPOFOL N/A 02/18/2018   Procedure: COLONOSCOPY WITH PROPOFOL;  Surgeon: Jonathon Bellows, MD;  Location: [facility] ENDOSCOPY;  Service: Gastroenterology;  Laterality: N/A;  . DOPPLER ECHOCARDIOGRAPHY  2013  . left kene arthroscopy     . LUMBAR LAMINECTOMY/DECOMPRESSION MICRODISCECTOMY N/A 01/30/2015   Procedure: MICRO LUMBAR DECOMPRESSION, BILATERAL FORAMINOTOMIES, MICRODISCECTOMY LUMBAR FOUR TO LUMBAR FIVE;  Surgeon: Susa Day, MD;  Location: [facility] ORS;  Service: Orthopedics;  Laterality: N/A;  . right knee surgery    .  ROTATOR CUFF REPAIR  2007   Left  . SHOULDER OPEN ROTATOR CUFF REPAIR Right 08/08/2015   Procedure: RIGHT SHOULDER MINI OPEN ROTATOR CUFF REPAIR SUBACROMIAL DECOMPRESSION ;  Surgeon: Susa Day, MD;  Location: [facility] ORS;  Service: Orthopedics;  Laterality: Right;   Family History  Problem Relation Age of Onset  . Dementia Mother   . Hypertension Mother   . Cancer Father        colon  . Heart disease Father   . Congestive Heart Failure Father   . Hypertension Sister   . Dementia Maternal Grandmother   . Kidney disease Neg Hx   . Prostate cancer Neg Hx   . COPD Neg Hx   . Diabetes Neg Hx   . Stroke Neg Hx   . Kidney cancer Neg Hx   . Bladder Cancer Neg Hx    Social History   Tobacco Use  . Smoking status: Former Smoker    Packs/day: 0.50    Years: 30.00    Pack years: 15.00    Types: Cigarettes    Last attempt to quit: 10/19/2004    Years since quitting: 14.1  . Smokeless tobacco: Never Used  Substance Use Topics  . Alcohol use: Yes    Alcohol/week: 0.0 standard drinks    Comment: occasional  . Drug use: No     [facility]/18/[facility]  AUDIT-C Score  3      Interim medical history since last visit reviewed. Allergies and medications reviewed  Review of Systems Per HPI unless specifically indicated above     Objective:    BP 128/78   Pulse 73   Temp 98.1 F (36.7 C)   Ht 5\' 11"  (1.803 m)   Wt 290 lb 14.4 oz (132 kg)   SpO2 99%   BMI 40.57 kg/m   Wt Readings from Last 3 Encounters:  11/18/18 290 lb 14.4 oz (132 kg)  08/12/18 284 lb 6.4 oz (129 kg)  07/01/18 285 lb (129.3 kg)    Physical Exam Constitutional:      General: He is not in acute distress.    Appearance: He is well-developed. He is obese.  HENT:     Head: Normocephalic and atraumatic.  Eyes:     General: No scleral icterus. Neck:     Thyroid: No thyromegaly.  Cardiovascular:     Rate and Rhythm: Normal rate and regular rhythm.  Pulmonary:      Effort: Pulmonary effort is normal.     Breath sounds: Normal breath sounds.  Abdominal:     General: Bowel sounds are normal. There is no  distension.     Palpations: Abdomen is soft.  Skin:    General: Skin is warm and dry.     Coloration: Skin is not pale.  Neurological:     Mental Status: He is alert.     Coordination: Coordination normal.  Psychiatric:        Behavior: Behavior normal.        Thought Content: Thought content normal.        Judgment: Judgment normal.       Assessment & Plan:   Problem List Items Addressed This Visit      Cardiovascular and Mediastinum   HTN (hypertension) (Chronic)    Try to follow DASH guidelines      Relevant Medications   carvedilol (COREG) 12.5 MG tablet     Respiratory   OSA (obstructive sleep apnea) (Chronic)     Other   Medication monitoring encounter   Relevant Orders   COMPLETE METABOLIC PANEL WITH GFR (Completed)   Vitamin D deficiency   Prediabetes    Check A1c      Relevant Orders   Hemoglobin A1c (Completed)   Hyperlipidemia (Chronic)    Check lipids      Relevant Medications   carvedilol (COREG) 12.5 MG tablet   Other Relevant Orders   Lipid panel (Completed)   Hyperglycemia    Check A1c      Relevant Orders   Hemoglobin A1c (Completed)   Morbid obesity (HCC)    Encouraged weight loss; see AVS; so many of his health issues hinge on his morbid obesity       Other Visit Diagnoses    Neuropathy    -  Primary   check B12 and TSH   Relevant Orders   TSH (Completed)   Vitamin B12 (Completed)       Follow up plan: Return in about 3 months (around 02/16/2019) for follow-up visit with Dr. Sanda Klein.  An after-visit summary was printed and given to the patient at [place].  Please see the patient instructions which may contain other information and recommendations beyond what is mentioned above in the assessment and plan.  Meds ordered this encounter  Medications  . carvedilol (COREG) 12.5 MG tablet     Sig: Take 1 tablet (12.5 mg total) by mouth 2 (two) times daily with a meal.    Dispense:  60 tablet    Refill:  3    Increasing dose    Orders Placed This Encounter  Procedures  . TSH  . Vitamin B12  . Lipid panel  . Hemoglobin A1c  . COMPLETE METABOLIC PANEL WITH GFR

## 2018-11-18 NOTE — Assessment & Plan Note (Signed)
Check lipids 

## 2018-11-18 NOTE — Patient Instructions (Addendum)
Try to use PLAIN allergy medicine without the decongestant Avoid: phenylephrine, phenylpropanolamine, and pseudoephredine If you need something for aches or pains, try to use Tylenol (acetaminophen) instead of non-steroidals (which include Aleve, ibuprofen, Advil, Motrin, and naproxen); non-steroidals can cause long-term kidney damage Try to follow the DASH guidelines (DASH stands for Dietary Approaches to Stop Hypertension). Try to limit the sodium in your diet to no more than 1,500mg  of sodium per day. Certainly try to not exceed 2,000 mg per day at the very most. Do not add salt when cooking or at the table.  Check the sodium amount on labels when shopping, and choose items lower in sodium when given a choice. Avoid or limit foods that already contain a lot of sodium. Eat a diet rich in fruits and vegetables and whole grains, and try to lose weight if overweight or obese  Increase carvediolol from 6.25 twice a day to 12.5 twice a day Monitor your blood pressure and heart rate and contact me in one week  Try to limit egg yolks to no more than 3 per week; Egg Beaters are fine  Check out the information at familydoctor.org entitled "Nutrition for Weight Loss: What You Need to Know about Fad Diets" Try to lose between 1-2 pounds per week by taking in fewer calories and burning off more calories You can succeed by limiting portions, limiting foods dense in calories and fat, becoming more active, and drinking 8 glasses of water a day (64 ounces) Don't skip meals, especially breakfast, as skipping meals may alter your metabolism Do not use over-the-counter weight loss pills or gimmicks that claim rapid weight loss A healthy BMI (or body mass index) is between 18.5 and 24.9 You can calculate your ideal BMI at the [place] website ClubMonetize.fr  We'll get labs today  Please do contact the orthopaedist about the place in your knee  Vitamin B12 is  fine  DASH Eating Plan DASH stands for "Dietary Approaches to Stop Hypertension." The DASH eating plan is a healthy eating plan that has been shown to reduce high blood pressure (hypertension). It may also reduce your risk for type 2 diabetes, heart disease, and stroke. The DASH eating plan may also help with weight loss. What are tips for following this plan?  General guidelines  Avoid eating more than 2,300 mg (milligrams) of salt (sodium) a day. If you have hypertension, you may need to reduce your sodium intake to 1,500 mg a day.  Limit alcohol intake to no more than 1 drink a day for nonpregnant women and 2 drinks a day for men. One drink equals 12 oz of beer, 5 oz of wine, or 1 oz of hard liquor.  Work with your health care provider to maintain a healthy body weight or to lose weight. Ask what an ideal weight is for you.  Get at least 30 minutes of exercise that causes your heart to beat faster (aerobic exercise) most days of the week. Activities may include walking, swimming, or biking.  Work with your health care provider or diet and nutrition specialist (dietitian) to adjust your eating plan to your individual calorie needs. Reading food labels   Check food labels for the amount of sodium per serving. Choose foods with less than 5 percent of the Daily Value of sodium. Generally, foods with less than 300 mg of sodium per serving fit into this eating plan.  To find whole grains, look for the word "whole" as the first word in the ingredient list.  Shopping  Buy products labeled as "low-sodium" or "no salt added."  Buy fresh foods. Avoid canned foods and premade or frozen meals. Cooking  Avoid adding salt when cooking. Use salt-free seasonings or herbs instead of table salt or sea salt. Check with your health care provider or pharmacist before using salt substitutes.  Do not fry foods. Cook foods using healthy methods such as baking, boiling, grilling, and broiling  instead.  Cook with heart-healthy oils, such as olive, canola, soybean, or sunflower oil. Meal planning  Eat a balanced diet that includes: ? 5 or more servings of fruits and vegetables each day. At each meal, try to fill half of your plate with fruits and vegetables. ? Up to 6-8 servings of whole grains each day. ? Less than 6 oz of lean meat, poultry, or fish each day. A 3-oz serving of meat is about the same size as a deck of cards. One egg equals 1 oz. ? 2 servings of low-fat dairy each day. ? A serving of nuts, seeds, or beans 5 times each week. ? Heart-healthy fats. Healthy fats called Omega-3 fatty acids are found in foods such as flaxseeds and coldwater fish, like sardines, salmon, and mackerel.  Limit how much you eat of the following: ? Canned or prepackaged foods. ? Food that is high in trans fat, such as fried foods. ? Food that is high in saturated fat, such as fatty meat. ? Sweets, desserts, sugary drinks, and other foods with added sugar. ? Full-fat dairy products.  Do not salt foods before eating.  Try to eat at least 2 vegetarian meals each week.  Eat more home-cooked food and less restaurant, buffet, and fast food.  When eating at a restaurant, ask that your food be prepared with less salt or no salt, if possible. What foods are recommended? The items listed may not be a complete list. Talk with your dietitian about what dietary choices are best for you. Grains Whole-grain or whole-wheat bread. Whole-grain or whole-wheat pasta. Brown rice. [place]. Bulgur. Whole-grain and low-sodium cereals. Pita bread. Low-fat, low-sodium crackers. Whole-wheat flour tortillas. Vegetables Fresh or frozen vegetables (raw, steamed, roasted, or grilled). Low-sodium or reduced-sodium tomato and vegetable juice. Low-sodium or reduced-sodium tomato sauce and tomato paste. Low-sodium or reduced-sodium canned vegetables. Fruits All fresh, dried, or frozen fruit. Canned fruit in  natural juice (without added sugar). Meat and other protein foods Skinless chicken or [place]. Ground chicken or [place]. Pork with fat trimmed off. Fish and seafood. Egg whites. Dried beans, peas, or lentils. Unsalted nuts, nut butters, and seeds. Unsalted canned beans. Lean cuts of beef with fat trimmed off. Low-sodium, lean deli meat. Dairy Low-fat (1%) or fat-free (skim) milk. Fat-free, low-fat, or reduced-fat cheeses. Nonfat, low-sodium ricotta or cottage cheese. Low-fat or nonfat yogurt. Low-fat, low-sodium cheese. Fats and oils Soft margarine without trans fats. Vegetable oil. Low-fat, reduced-fat, or light mayonnaise and salad dressings (reduced-sodium). Canola, safflower, olive, soybean, and sunflower oils. Avocado. Seasoning and other foods Herbs. Spices. Seasoning mixes without salt. Unsalted popcorn and pretzels. Fat-free sweets. What foods are not recommended? The items listed may not be a complete list. Talk with your dietitian about what dietary choices are best for you. Grains Baked goods made with fat, such as croissants, muffins, or some breads. Dry pasta or rice meal packs. Vegetables Creamed or fried vegetables. Vegetables in a cheese sauce. Regular canned vegetables (not low-sodium or reduced-sodium). Regular canned tomato sauce and paste (not low-sodium or reduced-sodium). Regular tomato and vegetable  juice (not low-sodium or reduced-sodium). Angie Fava. Olives. Fruits Canned fruit in a light or heavy syrup. Fried fruit. Fruit in cream or butter sauce. Meat and other protein foods Fatty cuts of meat. Ribs. Fried meat. Berniece Salines. Sausage. Bologna and other processed lunch meats. Salami. Fatback. Hotdogs. Bratwurst. Salted nuts and seeds. Canned beans with added salt. Canned or smoked fish. Whole eggs or egg yolks. Chicken or [place] with skin. Dairy Whole or 2% milk, cream, and half-and-half. Whole or full-fat cream cheese. Whole-fat or sweetened yogurt. Full-fat cheese. Nondairy  creamers. Whipped toppings. Processed cheese and cheese spreads. Fats and oils Butter. Stick margarine. Lard. Shortening. Ghee. Bacon fat. Tropical oils, such as coconut, palm kernel, or palm oil. Seasoning and other foods Salted popcorn and pretzels. Onion salt, garlic salt, seasoned salt, table salt, and sea salt. Worcestershire sauce. Tartar sauce. Barbecue sauce. Teriyaki sauce. Soy sauce, including reduced-sodium. Steak sauce. Canned and packaged gravies. Fish sauce. Oyster sauce. Cocktail sauce. Horseradish that you find on the shelf. Ketchup. Mustard. Meat flavorings and tenderizers. Bouillon cubes. Hot sauce and Tabasco sauce. Premade or packaged marinades. Premade or packaged taco seasonings. Relishes. Regular salad dressings. Where to find more information:  National Heart, Lung, and [place]: https://wilson-eaton.com/  American Heart Association: www.heart.org Summary  The DASH eating plan is a healthy eating plan that has been shown to reduce high blood pressure (hypertension). It may also reduce your risk for type 2 diabetes, heart disease, and stroke.  With the DASH eating plan, you should limit salt (sodium) intake to 2,300 mg a day. If you have hypertension, you may need to reduce your sodium intake to 1,500 mg a day.  When on the DASH eating plan, aim to eat more fresh fruits and vegetables, whole grains, lean proteins, low-fat dairy, and heart-healthy fats.  Work with your health care provider or diet and nutrition specialist (dietitian) to adjust your eating plan to your individual calorie needs. This information is not intended to replace advice given to you by your health care provider. Make sure you discuss any questions you have with your health care provider. Document Released: 09/24/2011 Document Revised: 09/28/2016 Document Reviewed: 09/28/2016 Elsevier Interactive Patient Education  2019 Elsevier Inc.  Obesity, Adult Obesity is the condition of having too much total  body fat. Being overweight or obese means that your weight is greater than what is considered healthy for your body size. Obesity is determined by a measurement called BMI. BMI is an estimate of body fat and is calculated from height and weight. For adults, a BMI of 30 or higher is considered obese. Obesity can eventually lead to other health concerns and major illnesses, including:  Stroke.  Coronary artery disease (CAD).  Type 2 diabetes.  Some types of cancer, including cancers of the colon, breast, uterus, and gallbladder.  Osteoarthritis.  High blood pressure (hypertension).  High cholesterol.  Sleep apnea.  Gallbladder stones.  Infertility problems. What are the causes? The main cause of obesity is taking in (consuming) more calories than your body uses for energy. Other factors that contribute to this condition may include:  Being born with genes that make you more likely to become obese.  Having a medical condition that causes obesity. These conditions include: ? Hypothyroidism. ? Polycystic ovarian syndrome (PCOS). ? Binge-eating disorder. ? Cushing syndrome.  Taking certain medicines, such as steroids, antidepressants, and seizure medicines.  Not being physically active (sedentary lifestyle).  Living where there are limited places to exercise safely or buy healthy foods.  Not getting enough sleep. What increases the risk? The following factors may increase your risk of this condition:  Having a family history of obesity.  Being a woman of African-American descent.  Being a man of Hispanic descent. What are the signs or symptoms? Having excessive body fat is the main symptom of this condition. How is this diagnosed? This condition may be diagnosed based on:  Your symptoms.  Your medical history.  A physical exam. Your health care provider may measure: ? Your BMI. If you are an adult with a BMI between 25 and less than 30, you are considered  overweight. If you are an adult with a BMI of 30 or higher, you are considered obese. ? The distances around your hips and your waist (circumferences). These may be compared to each other to help diagnose your condition. ? Your skinfold thickness. Your health care provider may gently pinch a fold of your skin and measure it. How is this treated? Treatment for this condition often includes changing your lifestyle. Treatment may include some or all of the following:  Dietary changes. Work with your health care provider and a dietitian to set a weight-loss goal that is healthy and reasonable for you. Dietary changes may include eating: ? Smaller portions. A portion size is the amount of a particular food that is healthy for you to eat at one time. This varies from person to person. ? Low-calorie or low-fat options. ? More whole grains, fruits, and vegetables.  Regular physical activity. This may include aerobic activity (cardio) and strength training.  Medicine to help you lose weight. Your health care provider may prescribe medicine if you are unable to lose 1 pound a week after 6 weeks of eating more healthily and doing more physical activity.  Surgery. Surgical options may include gastric banding and gastric bypass. Surgery may be done if: ? Other treatments have not helped to improve your condition. ? You have a BMI of 40 or higher. ? You have life-threatening health problems related to obesity. Follow these instructions at home:  Eating and drinking   Follow recommendations from your health care provider about what you eat and drink. Your health care provider may advise you to: ? Limit fast foods, sweets, and processed snack foods. ? Choose low-fat options, such as low-fat milk instead of whole milk. ? Eat 5 or more servings of fruits or vegetables every day. ? Eat at home more often. This gives you more control over what you eat. ? Choose healthy foods when you eat out. ? Learn what  a healthy portion size is. ? Keep low-fat snacks on hand. ? Avoid sugary drinks, such as soda, fruit juice, iced tea sweetened with sugar, and flavored milk. ? Eat a healthy breakfast.  Drink enough water to keep your urine clear or pale yellow.  Do not go without eating for long periods of time (do not fast) or follow a fad diet. Fasting and fad diets can be unhealthy and even dangerous. Physical Activity  Exercise regularly, as told by your health care provider. Ask your health care provider what types of exercise are safe for you and how often you should exercise.  Warm up and stretch before being active.  Cool down and stretch after being active.  Rest between periods of activity. Lifestyle  Limit the time that you spend in front of your TV, computer, or video game system.  Find ways to reward yourself that do not involve food.  Limit alcohol intake  to no more than 1 drink a day for nonpregnant women and 2 drinks a day for men. One drink equals 12 oz of beer, 5 oz of wine, or 1 oz of hard liquor. General instructions  Keep a weight loss journal to keep track of the food you eat and how much you exercise you get.  Take over-the-counter and prescription medicines only as told by your health care provider.  Take vitamins and supplements only as told by your health care provider.  Consider joining a support group. Your health care provider may be able to recommend a support group.  Keep all follow-up visits as told by your health care provider. This is important. Contact a health care provider if:  You are unable to meet your weight loss goal after 6 weeks of dietary and lifestyle changes. This information is not intended to replace advice given to you by your health care provider. Make sure you discuss any questions you have with your health care provider. Document Released: 11/12/2004 Document Revised: 03/09/2016 Document Reviewed: 07/24/2015 Elsevier Interactive Patient  Education  2019 Elsevier Inc.  Preventing Unhealthy [employer], Adult Staying at a healthy weight is important to your overall health. When fat builds up in your body, you may become overweight or obese. Being overweight or obese increases your risk of developing certain health problems, such as heart disease, diabetes, sleeping problems, joint problems, and some types of cancer. Unhealthy weight gain is often the result of making unhealthy food choices or not getting enough exercise. You can make changes to your lifestyle to prevent obesity and stay as healthy as possible. What nutrition changes can be made?   Eat only as much as your body needs. To do this: ? Pay attention to signs that you are hungry or full. Stop eating as soon as you feel full. ? If you feel hungry, try drinking water first before eating. Drink enough water so your urine is clear or pale yellow. ? Eat smaller portions. Pay attention to portion sizes when eating out. ? Look at serving sizes on food labels. Most foods contain more than one serving per container. ? Eat the recommended number of calories for your gender and activity level. For most active people, a daily total of 2,000 calories is appropriate. If you are trying to lose weight or are not very active, you may need to eat fewer calories. Talk with your health care provider or a diet and nutrition specialist (dietitian) about how many calories you need each day.  Choose healthy foods, such as: ? Fruits and vegetables. At each meal, try to fill at least half of your plate with fruits and vegetables. ? Whole grains, such as whole-wheat bread, brown rice, and quinoa. ? Lean meats, such as chicken or fish. ? Other healthy proteins, such as beans, eggs, or tofu. ? Healthy fats, such as nuts, seeds, fatty fish, and olive oil. ? Low-fat or fat-free dairy products.  Check food labels, and avoid food and drinks that: ? Are high in calories. ? Have added sugar. ? Are  high in sodium. ? Have saturated fats or trans fats.  Cook foods in healthier ways, such as by baking, broiling, or grilling.  Make a meal plan for the week, and shop with a grocery list to help you stay on track with your purchases. Try to avoid going to the grocery store when you are hungry.  When grocery shopping, try to shop around the outside of the store first, where  the fresh foods are. Doing this helps you to avoid prepackaged foods, which can be high in sugar, salt (sodium), and fat. What lifestyle changes can be made?   Exercise for 30 or more minutes on 5 or more days each week. Exercising may include brisk walking, yard work, biking, running, swimming, and team sports like basketball and soccer. Ask your health care provider which exercises are safe for you.  Do muscle-strengthening activities, such as lifting weights or using resistance bands, on 2 or more days a week.  Do not use any products that contain nicotine or tobacco, such as cigarettes and e-cigarettes. If you need help quitting, ask your health care provider.  Limit alcohol intake to no more than 1 drink a day for nonpregnant women and 2 drinks a day for men. One drink equals 12 oz of beer, 5 oz of wine, or 1 oz of hard liquor.  Try to get 7-9 hours of sleep each night. What other changes can be made?  Keep a food and activity journal to keep track of: ? What you ate and how many calories you had. Remember to count the calories in sauces, dressings, and side dishes. ? Whether you were active, and what exercises you did. ? Your calorie, weight, and activity goals.  Check your weight regularly. Track any changes. If you notice you have gained weight, make changes to your diet or activity routine.  Avoid taking weight-loss medicines or supplements. Talk to your health care provider before starting any new medicine or supplement.  Talk to your health care provider before trying any new diet or exercise plan. Why  are these changes important? Eating healthy, staying active, and having healthy habits can help you to prevent obesity. Those changes also:  Help you manage stress and emotions.  Help you connect with friends and family.  Improve your self-esteem.  Improve your sleep.  Prevent long-term health problems. What can happen if changes are not made? Being obese or overweight can cause you to develop joint or bone problems, which can make it hard for you to stay active or do activities you enjoy. Being obese or overweight also puts stress on your heart and lungs and can lead to health problems like diabetes, heart disease, and some cancers. Where to find more information Talk with your health care provider or a dietitian about healthy eating and healthy lifestyle choices. You may also find information from:  U.S. Department of Agriculture, MyPlate: FormerBoss.no  American Heart Association: www.heart.org  Centers for Disease Control and Prevention: http://www.wolf.info/ Summary  Staying at a healthy weight is important to your overall health. It helps you to prevent certain diseases and health problems, such as heart disease, diabetes, joint problems, sleep disorders, and some types of cancer.  Being obese or overweight can cause you to develop joint or bone problems, which can make it hard for you to stay active or do activities you enjoy.  You can prevent unhealthy weight gain by eating a healthy diet, exercising regularly, not smoking, limiting alcohol, and getting enough sleep.  Talk with your health care provider or a dietitian for guidance about healthy eating and healthy lifestyle choices. This information is not intended to replace advice given to you by your health care provider. Make sure you discuss any questions you have with your health care provider. Document Released: 10/06/2016 Document Revised: 07/16/2017 Document Reviewed: 11/11/2016 Elsevier Interactive Patient Education   2019 [employer].

## 2018-11-19 LAB — COMPLETE METABOLIC PANEL WITH GFR
AG Ratio: 2 (calc) (ref 1.0–2.5)
ALKALINE PHOSPHATASE (APISO): 73 U/L (ref 40–115)
ALT: 26 U/L (ref 9–46)
AST: 27 U/L (ref 10–35)
Albumin: 4.3 g/dL (ref 3.6–5.1)
BILIRUBIN TOTAL: 0.7 mg/dL (ref 0.2–1.2)
BUN: 15 mg/dL (ref 7–25)
CO2: 28 mmol/L (ref 20–32)
Calcium: 9.7 mg/dL (ref 8.6–10.3)
Chloride: 103 mmol/L (ref 98–110)
Creat: 1.12 mg/dL (ref 0.70–1.33)
GFR, Est African American: 85 mL/min/{1.73_m2} (ref >=60)
GFR, Est Non African American: 73 mL/min/{1.73_m2} (ref >=60)
Globulin: 2.2 g/dL (calc) (ref 1.9–3.7)
Glucose, Bld: 91 mg/dL (ref 65–99)
Potassium: 4 mmol/L (ref 3.5–5.3)
SODIUM: 141 mmol/L (ref 135–146)
Total Protein: 6.5 g/dL (ref 6.1–8.1)

## 2018-11-19 LAB — LIPID PANEL
Cholesterol: 139 mg/dL (ref <200)
HDL: 46 mg/dL (ref >40)
LDL Cholesterol (Calc): 78 mg/dL (calc)
NON-HDL CHOLESTEROL (CALC): 93 mg/dL (ref <130)
TRIGLYCERIDES: 71 mg/dL (ref <150)
Total CHOL/HDL Ratio: 3 (calc) (ref <5.0)

## 2018-11-19 LAB — HEMOGLOBIN A1C
Hgb A1c MFr Bld: 6.2 % of total Hgb — ABNORMAL HIGH (ref <5.7)
Mean Plasma Glucose: 131 (calc)
eAG (mmol/L): 7.3 (calc)

## 2018-11-19 LAB — TSH: TSH: 0.85 m[IU]/L (ref 0.40–4.50)

## 2018-11-19 LAB — VITAMIN B12: VITAMIN B 12: 932 pg/mL (ref 200–1100)

## 2018-11-22 ENCOUNTER — Encounter: Payer: Self-pay | Admitting: Family Medicine

## 2018-11-22 ENCOUNTER — Telehealth: Payer: Self-pay | Admitting: Family Medicine

## 2018-11-22 DIAGNOSIS — E782 Mixed hyperlipidemia: Secondary | ICD-10-CM

## 2018-11-22 DIAGNOSIS — Z5181 Encounter for therapeutic drug level monitoring: Secondary | ICD-10-CM

## 2018-11-22 MED ORDER — ATORVASTATIN CALCIUM 10 MG PO TABS: 10.0000 mg | ORAL_TABLET | Freq: Every day | ORAL | 1 refills | Status: DC

## 2018-11-22 NOTE — Telephone Encounter (Signed)
Copied from [place]. Topic: Quick Communication - See Telephone Encounter >> Nov 22, 2018 10:18 AM Vernona Rieger wrote: CRM for notification. See Telephone encounter for: 11/22/18.  Pt state he was in the office on Friday 1/31 and wanted to let Dr lada know that he is ready for her to seen in that medication. He said he is un sure of the name He uses [facility] in [place]

## 2018-11-22 NOTE — Telephone Encounter (Signed)
Great, Rx sent for statin This will help improve his cholesterol and hopefully reduce the risk of a heart attack Let's recheck labs in 6-8 weeks (I'll order)

## 2018-11-22 NOTE — Telephone Encounter (Signed)
Left detailed voicemail

## 2018-11-27 NOTE — Assessment & Plan Note (Addendum)
Encouraged weight loss; see AVS; so many of his health issues hinge on his morbid obesity

## 2018-12-05 ENCOUNTER — Telehealth: Payer: Self-pay

## 2018-12-05 NOTE — Telephone Encounter (Signed)
Patient returning call to [facility]. States that he will be by his phone all day tomorrow. Please advise.

## 2018-12-05 NOTE — Telephone Encounter (Signed)
Thank you for getting HIPAA clearance on the chart  I am most interested in the patient losing weight, as that should help improve his A1c, cholesterol, blood pressure, and overall health  We'll want to aim for safe gradual weight loss, 1-2 pounds per week until we reach a BMI less than 30 (we'll fine tune it and come up with patient-specific appropriate BMI for his muscle mass at that point using an impedance monitor if one is available to him)  I'm a big fan of plant-based diets, so if she can assist him with AVOIDING the keto diet and trying to eat less red meat, less saturated fats, that will be helpful  Thank you

## 2018-12-05 NOTE — Telephone Encounter (Signed)
LVM for patient to call to verify that is working with Vaughan Sine, [facility], in regards to his health goals.   I also reached out to Vaughan Sine and she informed me that she the patient's health coach through his medical insurance and that she sent over paperwork on 12/01/2018 that gives permission for patient's medical record to be shared with her in order to help patient reach his health goals.  I asked Lattie Haw if she would re-fax the paperwork to my attention so that provider can review it.

## 2018-12-05 NOTE — Telephone Encounter (Signed)
I don't know who this is or if she has HIPAA clearance

## 2018-12-05 NOTE — Telephone Encounter (Signed)
Returned Lattie Haw, from [employer].   We went over the patients current medication list. She wanted to know if Dr. Sanda Klein has any specific goals for the patient. She is currently working with him on weight loss and preventing his prediabetes into progressing into diabetes. They are working on decreasing his carb intake as well as his sugar/sweets intake. She wants to know if there is anything related to his chronic health conditions that she could help with or work on with the patient. Please advise.       Copied from [place] [phone]. Topic: General - Other >> Dec 01, 2018 11:52 AM Windy Kalata wrote: Reason for CRM: Lattie Haw from [facility]  calling to talk to someone about the patient she is his personal health nurse just wanting to introduce herself and talk to a nurse or MA.  Best call back is [phone]

## 2018-12-05 NOTE — Telephone Encounter (Signed)
HIPPA has been received and placed in Dr. Delight Ovens folder for review.

## 2018-12-06 NOTE — Telephone Encounter (Signed)
Spoke with patient and he did verify that Marc Stevens was his health coach with his insurance plan.

## 2018-12-15 ENCOUNTER — Other Ambulatory Visit: Payer: Self-pay | Admitting: Family Medicine

## 2018-12-15 NOTE — Telephone Encounter (Signed)
Refill request received for statin I just wrote 30+1 RF on 11/22/2018 to same pharmacy Please contact pharmacy and resolve I should not be providing any more refills until after he has his labs done and we see if the dose is correct

## 2018-12-16 NOTE — Telephone Encounter (Signed)
Left voicemail with pharmacy 

## 2018-12-23 ENCOUNTER — Ambulatory Visit: Payer: BLUE CROSS/BLUE SHIELD | Admitting: Family Medicine

## 2018-12-23 ENCOUNTER — Encounter: Payer: Self-pay | Admitting: Family Medicine

## 2018-12-23 VITALS — BP 126/72 | HR 78 | Temp 97.8°F | Resp 12 | Ht 71.0 in | Wt 292.3 lb

## 2018-12-23 DIAGNOSIS — E782 Mixed hyperlipidemia: Secondary | ICD-10-CM

## 2018-12-23 DIAGNOSIS — Z0289 Encounter for other administrative examinations: Secondary | ICD-10-CM

## 2018-12-23 DIAGNOSIS — G629 Polyneuropathy, unspecified: Secondary | ICD-10-CM

## 2018-12-23 DIAGNOSIS — R7303 Prediabetes: Secondary | ICD-10-CM

## 2018-12-23 DIAGNOSIS — Z5181 Encounter for therapeutic drug level monitoring: Secondary | ICD-10-CM | POA: Diagnosis not present

## 2018-12-23 DIAGNOSIS — I10 Essential (primary) hypertension: Secondary | ICD-10-CM | POA: Diagnosis not present

## 2018-12-23 NOTE — Patient Instructions (Addendum)
  Return for fasting labs on or just after January 03, 2019 You don't need an appointment. Just come by between the hours of 8:00 am and noon or between 2:00 pm and 4:00 pm Monday through Friday.

## 2018-12-23 NOTE — Progress Notes (Signed)
BP 126/72   Pulse 78   Temp 97.8 F (36.6 C) (Oral)   Resp 12   Ht 5\' 11"  (1.803 m)   Wt 292 lb 4.8 oz (132.6 kg)   SpO2 96%   BMI 40.77 kg/m    Subjective:    [name], male    DOB: 03/28/1961, 58 y.o.   MRN: [id number]  [name] is a 58 y.o. male  Chief Complaint  Patient presents with  . Follow-up  . Paperwork  . Foot Pain    onset 1 month bilateral toes    HPI Patient is here for f/u  March readings range 329-924 systolic and 26-83 diastolic Pulse range 41-96 He is doing everything he can do with salt and sodium reduction No decongestants No NSAIDs He is only taking only 6.25 mg BID  He is having burning sensation around his toes Getting a little worried When he puts his feet in the shoes Burning feeling Only happens with the shoes Composite steel toes and laced up pretty good  Taking metformin for prediabetes Room for improvement with his diet Don't drink sugary drinks at work Alcohol with water instead of other chasers Pulling cheese out of the salads; mostly greens and grilled chicken; salmon and collard greens  Depression screen [facility] 2/9 11/18/2018 08/12/2018 05/13/2018 02/11/2018 09/17/2017  Decreased Interest 0 0 0 0 0  Down, Depressed, Hopeless 0 0 0 0 0  PHQ - 2 Score 0 0 0 0 0  Altered sleeping 0 0 - - -  Tired, decreased energy 0 0 - - -  Change in appetite 0 0 - - -  Feeling bad or failure about yourself  0 0 - - -  Trouble concentrating 0 0 - - -  Moving slowly or fidgety/restless 0 0 - - -  Suicidal thoughts 0 0 - - -  PHQ-9 Score 0 0 - - -  Difficult doing work/chores Not difficult at all Not difficult at all - - -  MD note: not depressed  Fall Risk  11/18/2018 08/12/2018 05/13/2018 05/13/2018 02/11/2018  Falls in the past year? 0 No No No No    Relevant past medical, surgical, family and social history reviewed Past Medical History:  Diagnosis Date  . BPH (benign prostatic hypertrophy)   .  Diabetes mellitus without complication ([place])   . ED (erectile dysfunction)   . HTN (hypertension) 04/18/2012  . Hyperlipidemia   . Low testosterone   . Morbidly obese ([place])   . OSA (obstructive sleep apnea)    cpap - does not know settings   . Rotator cuff tear    RT  . Torn Achilles tendon 07/11/2015   Past Surgical History:  Procedure Laterality Date  . COLONOSCOPY WITH PROPOFOL N/A 02/18/2018   Procedure: COLONOSCOPY WITH PROPOFOL;  Surgeon: Jonathon Bellows, MD;  Location: [facility] ENDOSCOPY;  Service: Gastroenterology;  Laterality: N/A;  . DOPPLER ECHOCARDIOGRAPHY  2013  . left kene arthroscopy     . LUMBAR LAMINECTOMY/DECOMPRESSION MICRODISCECTOMY N/A 01/30/2015   Procedure: MICRO LUMBAR DECOMPRESSION, BILATERAL FORAMINOTOMIES, MICRODISCECTOMY LUMBAR FOUR TO LUMBAR FIVE;  Surgeon: Susa Day, MD;  Location: [facility] ORS;  Service: Orthopedics;  Laterality: N/A;  . right knee surgery    . ROTATOR CUFF REPAIR  2007   Left  . SHOULDER OPEN ROTATOR CUFF REPAIR Right 08/08/2015   Procedure: RIGHT SHOULDER MINI OPEN ROTATOR CUFF REPAIR SUBACROMIAL DECOMPRESSION ;  Surgeon: Susa Day, MD;  Location: [facility];  Service: Orthopedics;  Laterality: Right;   Family History  Problem Relation Age of Onset  . Dementia Mother   . Hypertension Mother   . Cancer Father        colon  . Heart disease Father   . Congestive Heart Failure Father   . Hypertension Sister   . Dementia Maternal Grandmother   . Kidney disease Neg Hx   . Prostate cancer Neg Hx   . COPD Neg Hx   . Diabetes Neg Hx   . Stroke Neg Hx   . Kidney cancer Neg Hx   . Bladder Cancer Neg Hx    Social History   Tobacco Use  . Smoking status: Former Smoker    Packs/day: 0.50    Years: 30.00    Pack years: 15.00    Types: Cigarettes    Last attempt to quit: 10/19/2004    Years since quitting: 14.2  . Smokeless tobacco: Never Used  Substance Use Topics  . Alcohol use: Yes    Alcohol/week: 0.0 standard drinks    Comment: occasional   . Drug use: No     Office Visit from 11/18/2018 in [facility]  AUDIT-C Score  3      Interim medical history since last visit reviewed. Allergies and medications reviewed  Review of Systems Per HPI unless specifically indicated above     Objective:    BP 126/72   Pulse 78   Temp 97.8 F (36.6 C) (Oral)   Resp 12   Ht 5\' 11"  (1.803 m)   Wt 292 lb 4.8 oz (132.6 kg)   SpO2 96%   BMI 40.77 kg/m   Wt Readings from Last 3 Encounters:  12/23/18 292 lb 4.8 oz (132.6 kg)  11/18/18 290 lb 14.4 oz (132 kg)  08/12/18 284 lb 6.4 oz (129 kg)    Physical Exam Constitutional:      General: He is not in acute distress.    Appearance: He is well-developed. He is obese.     Comments: Morbidly obese  HENT:     Head: Normocephalic and atraumatic.  Eyes:     General: No scleral icterus. Neck:     Thyroid: No thyromegaly.  Cardiovascular:     Rate and Rhythm: Normal rate and regular rhythm.  Pulmonary:     Effort: Pulmonary effort is normal.     Breath sounds: Normal breath sounds.  Abdominal:     General: Bowel sounds are normal. There is no distension.     Palpations: Abdomen is soft.  Skin:    General: Skin is warm and dry.     Coloration: Skin is not pale.  Neurological:     Mental Status: He is alert.     Coordination: Coordination normal.  Psychiatric:        Behavior: Behavior normal.        Thought Content: Thought content normal.        Judgment: Judgment normal.     Results for orders placed or performed in visit on 11/18/18  TSH  Result Value Ref Range   TSH 0.85 0.40 - 4.50 mIU/L  Vitamin B12  Result Value Ref Range   Vitamin B-12 932 200 - 1,100 pg/mL  Lipid panel  Result Value Ref Range   Cholesterol 139 <200 mg/dL   HDL 46 >40 mg/dL   Triglycerides 71 <150 mg/dL   LDL Cholesterol (Calc) 78 mg/dL (calc)   Total CHOL/HDL Ratio 3.0 <5.0 (calc)   Non-HDL  Cholesterol (Calc) 93 <130 mg/dL (calc)  Hemoglobin A1c  Result Value Ref  Range   Hgb A1c MFr Bld 6.2 (H) <5.7 % of total Hgb   Mean Plasma Glucose 131 (calc)   eAG (mmol/L) 7.3 (calc)  COMPLETE METABOLIC PANEL WITH GFR  Result Value Ref Range   Glucose, Bld 91 65 - 99 mg/dL   BUN 15 7 - 25 mg/dL   Creat 1.12 0.70 - 1.33 mg/dL   GFR, Est Non African American 73 > OR = 60 mL/min/1.52m2   GFR, Est African American 85 > OR = 60 mL/min/1.6m2   BUN/Creatinine Ratio NOT APPLICABLE 6 - 22 (calc)   Sodium 141 135 - 146 mmol/L   Potassium 4.0 3.5 - 5.3 mmol/L   Chloride 103 98 - 110 mmol/L   CO2 28 20 - 32 mmol/L   Calcium 9.7 8.6 - 10.3 mg/dL   Total Protein 6.5 6.1 - 8.1 g/dL   Albumin 4.3 3.6 - 5.1 g/dL   Globulin 2.2 1.9 - 3.7 g/dL (calc)   AG Ratio 2.0 1.0 - 2.5 (calc)   Total Bilirubin 0.7 0.2 - 1.2 mg/dL   Alkaline phosphatase (APISO) 73 40 - 115 U/L   AST 27 10 - 35 U/L   ALT 26 9 - 46 U/L      Assessment & Plan:   Problem List Items Addressed This Visit      Cardiovascular and Mediastinum   HTN (hypertension) - Primary (Chronic)    Reviewed BP records with him; work on weight loss, healthy eating; heart is having to pump blood against more body, more pressure than it was designed for      Relevant Orders   BASIC METABOLIC PANEL WITH GFR     Other   Medication monitoring encounter   Hyperlipidemia (Chronic)   Prediabetes    Weight loss is single most important factor to prevent progression to type 2 diabetes; encouragement given for weight loss; monitor A1c every 6 months; continue metofrmin      Morbid obesity (HCC)    Encouragement given for weigh tloss; this is the single most important thing he needs to work on, which will help several other chronic health conditions       Other Visit Diagnoses    Encounter for completion of form with patient       reviewed form with patient   Neuropathy       of the distal feet associated with shoe lacing; resolved when not wearing shoes; suggested altering lacing, looser, avoiding of tight laces  over instep       Follow up plan: Return in about 3 months (around 03/25/2019) for follow-up visit with Dr. Sanda Klein; on or just after March 17th for fasting labs and BP check.  An after-visit summary was printed and given to the patient at [place].  Please see the patient instructions which may contain other information and recommendations beyond what is mentioned above in the assessment and plan.  No orders of the defined types were placed in this encounter.   Orders Placed This Encounter  Procedures  . BASIC METABOLIC PANEL WITH GFR

## 2018-12-30 NOTE — Assessment & Plan Note (Signed)
Encouragement given for weigh tloss; this is the single most important thing he needs to work on, which will help several other chronic health conditions

## 2018-12-30 NOTE — Assessment & Plan Note (Signed)
Reviewed BP records with him; work on weight loss, healthy eating; heart is having to pump blood against more body, more pressure than it was designed for

## 2018-12-30 NOTE — Assessment & Plan Note (Signed)
Weight loss is single most important factor to prevent progression to type 2 diabetes; encouragement given for weight loss; monitor A1c every 6 months; continue metofrmin

## 2019-01-09 ENCOUNTER — Encounter: Payer: Self-pay | Admitting: Family Medicine

## 2019-01-09 ENCOUNTER — Other Ambulatory Visit: Payer: Self-pay | Admitting: Nurse Practitioner

## 2019-01-09 DIAGNOSIS — G629 Polyneuropathy, unspecified: Secondary | ICD-10-CM

## 2019-01-25 ENCOUNTER — Other Ambulatory Visit: Payer: Self-pay | Admitting: Family Medicine

## 2019-02-01 ENCOUNTER — Ambulatory Visit: Payer: BLUE CROSS/BLUE SHIELD | Admitting: Podiatry

## 2019-02-01 ENCOUNTER — Encounter: Payer: Self-pay | Admitting: Podiatry

## 2019-02-01 ENCOUNTER — Ambulatory Visit (INDEPENDENT_AMBULATORY_CARE_PROVIDER_SITE_OTHER): Payer: BLUE CROSS/BLUE SHIELD

## 2019-02-01 ENCOUNTER — Other Ambulatory Visit: Payer: Self-pay

## 2019-02-01 ENCOUNTER — Other Ambulatory Visit: Payer: Self-pay | Admitting: Podiatry

## 2019-02-01 VITALS — Temp 96.4°F

## 2019-02-01 DIAGNOSIS — E1142 Type 2 diabetes mellitus with diabetic polyneuropathy: Secondary | ICD-10-CM | POA: Diagnosis not present

## 2019-02-01 DIAGNOSIS — M7752 Other enthesopathy of left foot: Secondary | ICD-10-CM | POA: Diagnosis not present

## 2019-02-01 DIAGNOSIS — M79673 Pain in unspecified foot: Secondary | ICD-10-CM | POA: Diagnosis not present

## 2019-02-01 DIAGNOSIS — M7751 Other enthesopathy of right foot: Secondary | ICD-10-CM | POA: Diagnosis not present

## 2019-02-01 DIAGNOSIS — M7750 Other enthesopathy of unspecified foot: Secondary | ICD-10-CM

## 2019-02-01 MED ORDER — GABAPENTIN 100 MG PO CAPS: 100.0000 mg | ORAL_CAPSULE | Freq: Two times a day (BID) | ORAL | 3 refills | Status: DC

## 2019-02-01 NOTE — Progress Notes (Signed)
Subjective:  [name], male    DOB: [birth year],  MRN: [id number] HPI Chief Complaint  Patient presents with  . Toe Pain    Toes bilateral - burning x 1.5 months, worse at work when his shoes are on, works at [employer], tried soaking, wears lymphedema pumps on legs daily  . New Patient (Initial Visit)    58 y.o. male presents with the above complaint.   ROS: Denies fever chills nausea vomiting muscle aches pains calf pain back pain chest pain shortness of breath.  Past Medical History:  Diagnosis Date  . BPH (benign prostatic hypertrophy)   . Diabetes mellitus without complication ([place])   . ED (erectile dysfunction)   . HTN (hypertension) 04/18/2012  . Hyperlipidemia   . Low testosterone   . Morbidly obese ([place])   . OSA (obstructive sleep apnea)    cpap - does not know settings   . Rotator cuff tear    RT  . Torn Achilles tendon 07/11/2015   Past Surgical History:  Procedure Laterality Date  . COLONOSCOPY WITH PROPOFOL N/A 02/18/2018   Procedure: COLONOSCOPY WITH PROPOFOL;  Surgeon: Jonathon Bellows, MD;  Location: [facility] ENDOSCOPY;  Service: Gastroenterology;  Laterality: N/A;  . DOPPLER ECHOCARDIOGRAPHY  2013  . left kene arthroscopy     . LUMBAR LAMINECTOMY/DECOMPRESSION MICRODISCECTOMY N/A 01/30/2015   Procedure: MICRO LUMBAR DECOMPRESSION, BILATERAL FORAMINOTOMIES, MICRODISCECTOMY LUMBAR FOUR TO LUMBAR FIVE;  Surgeon: Susa Day, MD;  Location[facility];  Service: Orthopedics;  Laterality: N/A;  . right knee surgery    . ROTATOR CUFF REPAIR  2007   Left  . SHOULDER OPEN ROTATOR CUFF REPAIR Right 08/08/2015   Procedure: RIGHT SHOULDER MINI OPEN ROTATOR CUFF REPAIR SUBACROMIAL DECOMPRESSION ;  Surgeon: Susa Day, MD;  Location: [facility] ORS;  Service: Orthopedics;  Laterality: Right;    Current Outpatient Medications:  .  amLODipine (NORVASC) 2.5 MG tablet, TAKE 1 TABLET BY MOUTH EVERY DAY, Disp: 90 tablet, Rfl: 3 .  aspirin EC 81 MG tablet, Take 1 tablet (81 mg  total) by mouth every morning. Resume 4 days post-op, Disp: , Rfl:  .  atorvastatin (LIPITOR) 10 MG tablet, Take 1 tablet (10 mg total) by mouth at bedtime., Disp: 30 tablet, Rfl: 1 .  carvedilol (COREG) 12.5 MG tablet, Take 1 tablet (12.5 mg total) by mouth 2 (two) times daily with a meal., Disp: 60 tablet, Rfl: 3 .  Diclofenac Sodium (PENNSAID) 2 % SOLN, Place 2 application onto the skin 2 (two) times daily., Disp: , Rfl:  .  gabapentin (NEURONTIN) 100 MG capsule, Take 1 capsule (100 mg total) by mouth 2 (two) times daily., Disp: 180 capsule, Rfl: 3 .  hydrochlorothiazide (HYDRODIURIL) 25 MG tablet, TAKE 1 TABLET BY MOUTH EVERY DAY, Disp: 90 tablet, Rfl: 0 .  ketoconazole (NIZORAL) 2 % cream, Apply 1 application topically 2 (two) times daily., Disp: 45 g, Rfl: 3 .  losartan (COZAAR) 100 MG tablet, TAKE 1 TABLET BY MOUTH EVERY DAY, Disp: 90 tablet, Rfl: 2 .  metFORMIN (GLUCOPHAGE-XR) 500 MG 24 hr tablet, TAKE 2 TABLETS BY MOUTH EVERY DAY WITH BREAKFAST, Disp: 180 tablet, Rfl: 0 .  sildenafil (REVATIO) 20 MG tablet, Take 2 to 5 tablets 30 minutes prior to intercourse on an empty stomach, Disp: , Rfl:   No Known Allergies Review of Systems Objective:   Vitals:   02/01/19 0918  Temp: (!) 96.4 F (35.8 C)    General: Well developed, nourished, in no acute distress, alert  and oriented x3   Dermatological: Skin is warm, dry and supple bilateral. Nails x 10 are well maintained; remaining integument appears unremarkable at this time. There are no open sores, no preulcerative lesions, no rash or signs of infection present.  Vascular: Dorsalis Pedis artery and Posterior Tibial artery pedal pulses are 2/4 bilateral with immedate capillary fill time. Pedal hair growth present. No varicosities and no lower extremity edema present bilateral.   Neruologic: Grossly intact via light touch bilateral. Vibratory intact via tuning fork bilateral. Protective threshold with Semmes Wienstein monofilament  diminished to all pedal sites bilateral. Patellar and Achilles deep tendon reflexes 2+ bilateral. No Babinski or clonus noted bilateral.  Slight decrease in sensorium per Semmes Weinstein monofilament to the distal aspect of the toes.  Musculoskeletal: No gross boney pedal deformities bilateral. No pain, crepitus, or limitation noted with foot and ankle range of motion bilateral. Muscular strength 5/5 in all groups tested bilateral.  Gait: Unassisted, Nonantalgic.    Radiographs:  No acute findings  Assessment & Plan:   Assessment: Early diabetic peripheral neuropathy  Plan: Start him on gabapentin breakfast and dinner 100 mg I will follow-up with him in 1 month      T. [place], [place]

## 2019-02-14 ENCOUNTER — Other Ambulatory Visit: Payer: Self-pay | Admitting: Family Medicine

## 2019-02-15 ENCOUNTER — Other Ambulatory Visit: Payer: Self-pay | Admitting: Family Medicine

## 2019-02-17 ENCOUNTER — Ambulatory Visit (INDEPENDENT_AMBULATORY_CARE_PROVIDER_SITE_OTHER): Payer: BLUE CROSS/BLUE SHIELD | Admitting: Nurse Practitioner

## 2019-02-17 ENCOUNTER — Other Ambulatory Visit: Payer: Self-pay

## 2019-02-17 ENCOUNTER — Encounter: Payer: Self-pay | Admitting: Nurse Practitioner

## 2019-02-17 ENCOUNTER — Ambulatory Visit: Payer: BLUE CROSS/BLUE SHIELD | Admitting: Family Medicine

## 2019-02-17 VITALS — BP 130/80 | HR 68

## 2019-02-17 DIAGNOSIS — G4733 Obstructive sleep apnea (adult) (pediatric): Secondary | ICD-10-CM | POA: Diagnosis not present

## 2019-02-17 DIAGNOSIS — I10 Essential (primary) hypertension: Secondary | ICD-10-CM | POA: Diagnosis not present

## 2019-02-17 DIAGNOSIS — R7303 Prediabetes: Secondary | ICD-10-CM | POA: Diagnosis not present

## 2019-02-17 DIAGNOSIS — R21 Rash and other nonspecific skin eruption: Secondary | ICD-10-CM

## 2019-02-17 DIAGNOSIS — E782 Mixed hyperlipidemia: Secondary | ICD-10-CM | POA: Diagnosis not present

## 2019-02-17 DIAGNOSIS — I517 Cardiomegaly: Secondary | ICD-10-CM

## 2019-02-17 DIAGNOSIS — N528 Other male erectile dysfunction: Secondary | ICD-10-CM

## 2019-02-17 DIAGNOSIS — G629 Polyneuropathy, unspecified: Secondary | ICD-10-CM

## 2019-02-17 MED ORDER — SILDENAFIL CITRATE 20 MG PO TABS: ORAL_TABLET | ORAL | Status: DC

## 2019-02-17 MED ORDER — LOSARTAN POTASSIUM 100 MG PO TABS: 100.0000 mg | ORAL_TABLET | Freq: Every day | ORAL | 0 refills | Status: DC

## 2019-02-17 MED ORDER — ATORVASTATIN CALCIUM 10 MG PO TABS: 10.0000 mg | ORAL_TABLET | Freq: Every day | ORAL | 1 refills | Status: DC

## 2019-02-17 MED ORDER — KETOCONAZOLE 2 % EX CREA: 1.0000 "application " | TOPICAL_CREAM | Freq: Two times a day (BID) | CUTANEOUS | 3 refills | Status: DC

## 2019-02-17 NOTE — Progress Notes (Signed)
Virtual Visit via Video Note  I connected with Marc Stevens on 02/17/19 at  8:40 AM EDT by a video enabled telemedicine application and verified that I am speaking with the correct person using two identifiers.   Staff discussed the limitations of evaluation and management by telemedicine and the availability of in person appointments. The patient expressed understanding and agreed to proceed.  Patient location: home  My location: home office Other people present:  none HPI Hypertension Takes amlodipine 2.5mg , HCTZ 25mg , losartan 100mg , and carvedilol 12.5 mg as prescribed Has LVH and mitral regurgitation sees Dr. Fletcher Anon; cards.  BP Readings from Last 3 Encounters:  02/17/19 130/80  12/23/18 126/72  11/18/18 128/78   Sleep Apnea Self reported compliance 40%- states he is watching too much tv lately  Hyperlipidemia Atorvastatin 10mg  nightly Lab Results  Component Value Date   CHOL 139 11/18/2018   HDL 46 11/18/2018   LDLCALC 78 11/18/2018   TRIG 71 11/18/2018   CHOLHDL 3.0 11/18/2018   Prediabetes Metformin 2 tablets in the morning  Lab Results  Component Value Date   HGBA1C 6.2 (H) 11/18/2018   Erectile dysfunction Takes sildenafil PRN  Neuropathy  Patient endorses burning sensation in bilateral feet for about a month followed by Dr. Hall Busing and started on gabapentin states started increases dosage seems to improve pain a little bit.    PHQ2/9: Depression screen [facility] 2/9 02/17/2019 11/18/2018 08/12/2018 05/13/2018 02/11/2018  Decreased Interest 0 0 0 0 0  Down, Depressed, Hopeless 0 0 0 0 0  PHQ - 2 Score 0 0 0 0 0  Altered sleeping 0 0 0 - -  Tired, decreased energy 0 0 0 - -  Change in appetite 1 0 0 - -  Feeling bad or failure about yourself  2 0 0 - -  Trouble concentrating 0 0 0 - -  Moving slowly or fidgety/restless 0 0 0 - -  Suicidal thoughts 0 0 0 - -  PHQ-9 Score 3 0 0 - -  Difficult doing work/chores Not difficult at all Not difficult at all Not  difficult at all - -    PHQ reviewed.Negative  Patient Active Problem List   Diagnosis Date Noted  . Lymphedema 06/16/2018  . 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 Prediabetes 08/13/2017  . Medication monitoring encounter 09/04/2016  . Hyperglycemia 09/04/2016  . Right-sided chest wall pain 02/21/2016  . Diastolic dysfunction without heart failure 01/17/2016  . Mitral regurgitation 01/17/2016  . LVH (left ventricular hypertrophy) 01/01/2016  . Preventative health care 01/01/2016  . Complete rotator cuff tear 08/08/2015  . Torn Achilles tendon 07/11/2015  . Low testosterone 05/14/2015  . 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 Vitamin D deficiency 04/26/2015  . Beta thalassemia minor 04/26/2015  . Morbid obesity ([place])   . ED (erectile dysfunction)   . Hyperlipidemia   . OSA (obstructive sleep apnea)   . HTN (hypertension) 04/18/2012    Past Medical History:  Diagnosis Date  . BPH (benign prostatic hypertrophy)   . Diabetes mellitus without complication ([place])   . ED (erectile dysfunction)   . HTN (hypertension) 04/18/2012  . Hyperlipidemia   . Low testosterone   . Morbidly obese ([place])   . OSA (obstructive sleep apnea)    cpap - does not know settings   . Rotator cuff tear    RT  . Torn Achilles tendon 07/11/2015    Past Surgical History:  Procedure Laterality Date  . COLONOSCOPY WITH PROPOFOL N/A  02/18/2018   Procedure: COLONOSCOPY WITH PROPOFOL;  Surgeon: Jonathon Bellows, MD;  Location: [facility] ENDOSCOPY;  Service: Gastroenterology;  Laterality: N/A;  . DOPPLER ECHOCARDIOGRAPHY  2013  . left kene arthroscopy     . LUMBAR LAMINECTOMY/DECOMPRESSION MICRODISCECTOMY N/A 01/30/2015   Procedure: MICRO LUMBAR DECOMPRESSION, BILATERAL FORAMINOTOMIES, MICRODISCECTOMY LUMBAR FOUR TO LUMBAR FIVE;  Surgeon: Susa Day, MD;  Location[facility];  Service: Orthopedics;  Laterality: N/A;  . right knee surgery    . ROTATOR CUFF REPAIR  2007   Left  . SHOULDER OPEN ROTATOR  CUFF REPAIR Right 08/08/2015   Procedure: RIGHT SHOULDER MINI OPEN ROTATOR CUFF REPAIR SUBACROMIAL DECOMPRESSION ;  Surgeon: Susa Day, MD;  Location: [facility];  Service: Orthopedics;  Laterality: Right;    Social History   Tobacco Use  . Smoking status: Former Smoker    Packs/day: 0.50    Years: 30.00    Pack years: 15.00    Types: Cigarettes    Last attempt to quit: 10/19/2004    Years since quitting: 14.3  . Smokeless tobacco: Never Used  Substance Use Topics  . Alcohol use: Yes    Alcohol/week: 0.0 standard drinks    Comment: occasional     Current Outpatient Medications:  .  amLODipine (NORVASC) 2.5 MG tablet, TAKE 1 TABLET BY MOUTH EVERY DAY, Disp: 90 tablet, Rfl: 3 .  aspirin EC 81 MG tablet, Take 1 tablet (81 mg total) by mouth every morning. Resume 4 days post-op, Disp: , Rfl:  .  atorvastatin (LIPITOR) 10 MG tablet, Take 1 tablet (10 mg total) by mouth at bedtime., Disp: 30 tablet, Rfl: 1 .  carvedilol (COREG) 12.5 MG tablet, TAKE 1 TABLET (12.5 MG TOTAL) BY MOUTH 2 (TWO) TIMES DAILY WITH A MEAL., Disp: 180 tablet, Rfl: 1 .  Diclofenac Sodium (PENNSAID) 2 % SOLN, Place 2 application onto the skin 2 (two) times daily., Disp: , Rfl:  .  gabapentin (NEURONTIN) 100 MG capsule, Take 1 capsule (100 mg total) by mouth 2 (two) times daily., Disp: 180 capsule, Rfl: 3 .  hydrochlorothiazide (HYDRODIURIL) 25 MG tablet, TAKE 1 TABLET BY MOUTH EVERY DAY, Disp: 90 tablet, Rfl: 0 .  ketoconazole (NIZORAL) 2 % cream, Apply 1 application topically 2 (two) times daily., Disp: 45 g, Rfl: 3 .  losartan (COZAAR) 100 MG tablet, TAKE 1 TABLET BY MOUTH EVERY DAY, Disp: 90 tablet, Rfl: 2 .  metFORMIN (GLUCOPHAGE-XR) 500 MG 24 hr tablet, TAKE 2 TABLETS BY MOUTH EVERY DAY WITH BREAKFAST, Disp: 180 tablet, Rfl: 1 .  sildenafil (REVATIO) 20 MG tablet, Take 2 to 5 tablets 30 minutes prior to intercourse on an empty stomach, Disp: , Rfl:   No Known Allergies  Review of Systems  Constitutional:  Negative for chills and fever.  HENT: Negative for congestion and sinus pain.   Eyes: Negative for blurred vision.  Respiratory: Negative for cough and shortness of breath.   Cardiovascular: Negative for chest pain and palpitations.  Gastrointestinal: Negative for abdominal pain, diarrhea and vomiting.  Genitourinary: Negative for frequency and hematuria.  Musculoskeletal: Negative for myalgias.  Skin: Positive for rash. Negative for itching.  Neurological: Negative for dizziness and headaches.  Psychiatric/Behavioral: Negative for depression.     No other specific complaints in a complete review of systems (except as listed in HPI above).  Objective  Vitals:   02/17/19 0808  BP: 130/80  Pulse: 68     There is no height or weight on file to calculate BMI.  Nursing Note  and Vital Signs reviewed.  Physical Exam  Constitutional: Patient appears well-developed and well-nourished. No distress.  HENT: Head: Normocephalic and atraumatic. Cardiovascular: Normal rate Pulmonary/Chest: Effort normal  Musculoskeletal: Normal range of motion,  Neurological: he is alert and oriented to person, place, and time. speech and gait are normal.  Skin: No rash noted. No erythema. Area of lighter pigmentation on left arm, no pain or itching- states cream has helped.   Psychiatric: Patient has a normal mood and affect. behavior is normal. Judgment and thought content normal.    Assessment & Plan  1. Essential hypertension Stable  - losartan (COZAAR) 100 MG tablet; Take 1 tablet (100 mg total) by mouth daily.  Dispense: 90 tablet; Refill: 0  2. Prediabetes Continue metformin, diet and weight loss   3. Mixed hyperlipidemia Recheck after pandemic  - atorvastatin (LIPITOR) 10 MG tablet; Take 1 tablet (10 mg total) by mouth at bedtime.  Dispense: 90 tablet; Refill: 1  4. OSA (obstructive sleep apnea) Increase compliance with CPAP  5. Other male erectile dysfunction  - sildenafil  (REVATIO) 20 MG tablet; Take 2 to 5 tablets 30 minutes prior to intercourse on an empty stomach  Dispense: 10 tablet  6. LVH (left ventricular hypertrophy) BP controlled  7. Morbid obesity ([place]) Diet and exercise   8. Neuropathy Continue follow up with specialist, continue gabapentin   9. Rash of body - ketoconazole (NIZORAL) 2 % cream; Apply 1 application topically 2 (two) times daily.  Dispense: 45 g; Refill: 3   Follow Up Instructions:  3 months    I discussed the assessment and treatment plan with the patient. The patient was provided an opportunity to ask questions and all were answered. The patient agreed with the plan and demonstrated an understanding of the instructions.   The patient was advised to call back or seek an in-person evaluation if the symptoms worsen or if the condition fails to improve as anticipated.  I provided  21 minutes of non-face-to-face time during this encounter.   Fredderick Severance, NP

## 2019-03-08 ENCOUNTER — Ambulatory Visit: Payer: BLUE CROSS/BLUE SHIELD | Admitting: Podiatry

## 2019-03-22 ENCOUNTER — Ambulatory Visit (INDEPENDENT_AMBULATORY_CARE_PROVIDER_SITE_OTHER): Payer: BLUE CROSS/BLUE SHIELD | Admitting: Podiatry

## 2019-03-22 ENCOUNTER — Other Ambulatory Visit: Payer: Self-pay

## 2019-03-22 ENCOUNTER — Encounter: Payer: Self-pay | Admitting: Podiatry

## 2019-03-22 VITALS — Temp 97.2°F

## 2019-03-22 DIAGNOSIS — E1142 Type 2 diabetes mellitus with diabetic polyneuropathy: Secondary | ICD-10-CM | POA: Diagnosis not present

## 2019-03-22 MED ORDER — GABAPENTIN 300 MG PO CAPS: 300.0000 mg | ORAL_CAPSULE | Freq: Three times a day (TID) | ORAL | 3 refills | Status: DC

## 2019-03-22 NOTE — Progress Notes (Signed)
He presents today to have follow-up of his diabetic peripheral neuropathy.  He states that is still bad around 130 and 8:00 states that I am taking medicine at breakfast and at dinner.  Objective: Vital signs are stable alert and oriented x3 pulses are palpable.  No change in clinical findings.  No open lesions or wounds.  Assessment: Diabetic peripheral neuropathy.  Plan: At this point I recommend starting him breakfast 300 mg gabapentin also 300 mg at lunch and 300 mg at bedtime.  Follow-up with him in 1 month.

## 2019-03-27 ENCOUNTER — Ambulatory Visit: Payer: BLUE CROSS/BLUE SHIELD | Admitting: Family Medicine

## 2019-04-05 ENCOUNTER — Telehealth: Payer: Self-pay | Admitting: Family Medicine

## 2019-04-05 NOTE — Telephone Encounter (Signed)
Pt is scheduled for 05/30/2019

## 2019-04-05 NOTE — Telephone Encounter (Signed)
Please call to set up routine on or after 05/20/2019

## 2019-04-14 ENCOUNTER — Other Ambulatory Visit: Payer: Self-pay | Admitting: Podiatry

## 2019-04-19 ENCOUNTER — Ambulatory Visit: Payer: BLUE CROSS/BLUE SHIELD | Admitting: Podiatry

## 2019-04-20 ENCOUNTER — Ambulatory Visit: Payer: BLUE CROSS/BLUE SHIELD | Admitting: Nurse Practitioner

## 2019-04-20 ENCOUNTER — Other Ambulatory Visit: Payer: Self-pay

## 2019-04-20 ENCOUNTER — Encounter: Payer: Self-pay | Admitting: Nurse Practitioner

## 2019-04-25 ENCOUNTER — Encounter: Payer: Self-pay | Admitting: Family Medicine

## 2019-05-03 ENCOUNTER — Other Ambulatory Visit: Payer: Self-pay

## 2019-05-03 ENCOUNTER — Ambulatory Visit: Payer: BC Managed Care – PPO | Admitting: Podiatry

## 2019-05-03 ENCOUNTER — Ambulatory Visit (INDEPENDENT_AMBULATORY_CARE_PROVIDER_SITE_OTHER): Payer: BC Managed Care – PPO | Admitting: Podiatry

## 2019-05-03 ENCOUNTER — Encounter: Payer: Self-pay | Admitting: Podiatry

## 2019-05-03 VITALS — Temp 98.2°F

## 2019-05-03 DIAGNOSIS — M722 Plantar fascial fibromatosis: Secondary | ICD-10-CM | POA: Diagnosis not present

## 2019-05-03 DIAGNOSIS — E1142 Type 2 diabetes mellitus with diabetic polyneuropathy: Secondary | ICD-10-CM | POA: Diagnosis not present

## 2019-05-03 MED ORDER — GABAPENTIN 600 MG PO TABS: 600.0000 mg | ORAL_TABLET | Freq: Three times a day (TID) | ORAL | 3 refills | Status: DC

## 2019-05-03 NOTE — Progress Notes (Signed)
He presents today for follow-up of his neuropathy.  States that is doing better with the increase in medicine but still gets broken through during the day.  Burning and pain throughout the day while at work.  Objective: Vital signs are stable he is alert and oriented x3.  Pulses are palpable.  Still has pain on palpation medial calcaneal tubercles and on palpation of the foot in general more than likely associated with diabetic peripheral neuropathy.  Assessment: Diabetic peripheral neuropathy and plantar fasciitis.  Plan: At this point we will get him into a pair of orthotics and I went ahead and increase his gabapentin 600 mg 3 times a day.

## 2019-05-04 ENCOUNTER — Telehealth: Payer: Self-pay

## 2019-05-04 ENCOUNTER — Telehealth: Payer: Self-pay | Admitting: Nurse Practitioner

## 2019-05-04 NOTE — Telephone Encounter (Signed)
Copied from [place] [phone]. Topic: Appointment Scheduling - Scheduling Inquiry for Clinic >> May 04, 2019 12:00 PM Burchel, Abbi R wrote: Reason for CRM: Pt would like to sched appt w/nurse to have A1C drawn.  Please call pt to sched: [phone] *per pt: okay to leave VM

## 2019-05-04 NOTE — Telephone Encounter (Signed)
Copied from [place] [id number]. Topic: Appointment Scheduling - Scheduling Inquiry for Clinic >> May 04, 2019 12:00 PM Burchel, Abbi R wrote: Reason for CRM: Pt would like to sched appt w/nurse to have A1C drawn.  Please call pt to sched: [phone] *per pt: okay to leave VM >> May 04, 2019  3:52 PM Samson Frederic wrote: Pt already has an appt scheduled for august with Benjamine Mola. Does he need to be seen sooner to get this A1C ordered or are you willing to do an order and he just come in for lab work? Please advise

## 2019-05-04 NOTE — Telephone Encounter (Signed)
We can move up his follow-up a month, but since it was done 5 months ago and in prediabetic range insurance will not likely cover it until another month.

## 2019-05-05 NOTE — Telephone Encounter (Signed)
Left detailed VM.  

## 2019-05-05 NOTE — Telephone Encounter (Signed)
Pt called stating he would wait until his next appt to get it rechecked. Please advise.

## 2019-05-14 ENCOUNTER — Other Ambulatory Visit: Payer: Self-pay | Admitting: Nurse Practitioner

## 2019-05-24 ENCOUNTER — Other Ambulatory Visit: Payer: Self-pay

## 2019-05-24 ENCOUNTER — Ambulatory Visit: Payer: BC Managed Care – PPO | Admitting: Orthotics

## 2019-05-24 DIAGNOSIS — E1142 Type 2 diabetes mellitus with diabetic polyneuropathy: Secondary | ICD-10-CM

## 2019-05-24 DIAGNOSIS — M722 Plantar fascial fibromatosis: Secondary | ICD-10-CM

## 2019-05-24 NOTE — Progress Notes (Signed)
Patient came in today to pick up custom made foot orthotics.  The goals were accomplished and the patient reported no dissatisfaction with said orthotics.  Patient was advised of breakin period and how to report any issues. 

## 2019-05-26 ENCOUNTER — Ambulatory Visit: Payer: BLUE CROSS/BLUE SHIELD | Admitting: Family Medicine

## 2019-05-30 ENCOUNTER — Encounter: Payer: Self-pay | Admitting: Nurse Practitioner

## 2019-05-30 ENCOUNTER — Other Ambulatory Visit: Payer: Self-pay

## 2019-05-30 ENCOUNTER — Ambulatory Visit: Payer: BLUE CROSS/BLUE SHIELD | Admitting: Nurse Practitioner

## 2019-05-30 VITALS — BP 120/64 | HR 62 | Temp 98.2°F | Resp 14 | Ht 71.0 in | Wt 279.4 lb

## 2019-05-30 DIAGNOSIS — E559 Vitamin D deficiency, unspecified: Secondary | ICD-10-CM

## 2019-05-30 DIAGNOSIS — I10 Essential (primary) hypertension: Secondary | ICD-10-CM | POA: Diagnosis not present

## 2019-05-30 DIAGNOSIS — G4733 Obstructive sleep apnea (adult) (pediatric): Secondary | ICD-10-CM | POA: Diagnosis not present

## 2019-05-30 DIAGNOSIS — R7303 Prediabetes: Secondary | ICD-10-CM

## 2019-05-30 DIAGNOSIS — E782 Mixed hyperlipidemia: Secondary | ICD-10-CM

## 2019-05-30 DIAGNOSIS — Z5181 Encounter for therapeutic drug level monitoring: Secondary | ICD-10-CM

## 2019-05-30 DIAGNOSIS — N401 Enlarged prostate with lower urinary tract symptoms: Secondary | ICD-10-CM | POA: Diagnosis not present

## 2019-05-30 DIAGNOSIS — N528 Other male erectile dysfunction: Secondary | ICD-10-CM

## 2019-05-30 DIAGNOSIS — I517 Cardiomegaly: Secondary | ICD-10-CM | POA: Diagnosis not present

## 2019-05-30 DIAGNOSIS — N138 Other obstructive and reflux uropathy: Secondary | ICD-10-CM

## 2019-05-30 NOTE — Patient Instructions (Addendum)
-   work on getting sleep pattern back on track and try to get to 80% CPAP compliance.  -  Increase vitamin D in diet or by supplements usually recommend 800-1000IU day.

## 2019-05-30 NOTE — Progress Notes (Signed)
Name: Marc Stevens   MRN: [id number]    DOB: 1961-08-17   Date:05/30/2019       Progress Note  Subjective  Chief Complaint  Chief Complaint  Patient presents with  . Follow-up  . Medication Refill    HPI  Hypertension Takes amlodipine 2.5mg , HCTZ 25mg , losartan 100mg , and carvedilol 12.5 mg as prescribed Has LVH and mitral regurgitation sees Dr. Fletcher Anon; cards.  Denies lightheadedness, headaches, dizziness, chest pain.  Goes to eye doctor once a year, uses reading glasses more often now.  BP Readings from Last 3 Encounters:  05/30/19 120/64  02/17/19 130/80  12/23/18 126/72    Sleep Apnea Self reported compliance 50%- states his sleep pattern is very off   Hyperlipidemia Atorvastatin 10mg  nightly Lab Results  Component Value Date   CHOL 139 11/18/2018   HDL 46 11/18/2018   LDLCALC 78 11/18/2018   TRIG 71 11/18/2018   CHOLHDL 3.0 11/18/2018    Prediabetes Metformin 2 tablets in the morning  Diet: Has meals planned by dietician that helps with body building Breakfast: oatmeal and eggs (cheat day is chicken supremes) Lunch & Dinner: varies but typically chicken breast and greens.  Drinks lots of water- 1-2 gallons a day  Exercise: No routine exercise.   Erectile dysfunction Takes sildenafil PRN works alright for him.   Neuropathy  Patient endorses burning sensation in bilateral feet since April 2020, followed up podiatry. Started him 600mg  of gabapentin TID- with mild to moderate relief also using special insoles.    PHQ2/9: Depression screen [facility] 2/9 05/30/2019 02/17/2019 11/18/2018 08/12/2018 05/13/2018  Decreased Interest 0 0 0 0 0  Down, Depressed, Hopeless 0 0 0 0 0  PHQ - 2 Score 0 0 0 0 0  Altered sleeping 0 0 0 0 -  Tired, decreased energy 0 0 0 0 -  Change in appetite 0 1 0 0 -  Feeling bad or failure about yourself  0 2 0 0 -  Trouble concentrating 0 0 0 0 -  Moving slowly or fidgety/restless 0 0 0 0 -  Suicidal thoughts 0 0 0 0 -  PHQ-9  Score 0 3 0 0 -  Difficult doing work/chores Not difficult at all Not difficult at all Not difficult at all Not difficult at all -     PHQ reviewed. Negative  Patient Active Problem List   Diagnosis Date Noted  . Lymphedema 06/16/2018  . 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 Prediabetes 08/13/2017  . Medication monitoring encounter 09/04/2016  . Hyperglycemia 09/04/2016  . Right-sided chest wall pain 02/21/2016  . Diastolic dysfunction without heart failure 01/17/2016  . Mitral regurgitation 01/17/2016  . LVH (left ventricular hypertrophy) 01/01/2016  . Preventative health care 01/01/2016  . Complete rotator cuff tear 08/08/2015  . Torn Achilles tendon 07/11/2015  . Low testosterone 05/14/2015  . 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 Vitamin D deficiency 04/26/2015  . Beta thalassemia minor 04/26/2015  . Morbid obesity ([place])   . ED (erectile dysfunction)   . Hyperlipidemia   . OSA (obstructive sleep apnea)   . HTN (hypertension) 04/18/2012    Past Medical History:  Diagnosis Date  . BPH (benign prostatic hypertrophy)   . Diabetes mellitus without complication ([place])   . ED (erectile dysfunction)   . HTN (hypertension) 04/18/2012  . Hyperlipidemia   . Low testosterone   . Morbidly obese ([place])   . OSA (obstructive sleep apnea)    cpap - does not know settings   .  Rotator cuff tear    RT  . Torn Achilles tendon 07/11/2015    Past Surgical History:  Procedure Laterality Date  . COLONOSCOPY WITH PROPOFOL N/A 02/18/2018   Procedure: COLONOSCOPY WITH PROPOFOL;  Surgeon: Jonathon Bellows, MD;  Location: [facility] ENDOSCOPY;  Service: Gastroenterology;  Laterality: N/A;  . DOPPLER ECHOCARDIOGRAPHY  2013  . left kene arthroscopy     . LUMBAR LAMINECTOMY/DECOMPRESSION MICRODISCECTOMY N/A 01/30/2015   Procedure: MICRO LUMBAR DECOMPRESSION, BILATERAL FORAMINOTOMIES, MICRODISCECTOMY LUMBAR FOUR TO LUMBAR FIVE;  Surgeon: Susa Day, MD;  Location: [facility] ORS;  Service:  Orthopedics;  Laterality: N/A;  . right knee surgery    . ROTATOR CUFF REPAIR  2007   Left  . SHOULDER OPEN ROTATOR CUFF REPAIR Right 08/08/2015   Procedure: RIGHT SHOULDER MINI OPEN ROTATOR CUFF REPAIR SUBACROMIAL DECOMPRESSION ;  Surgeon: Susa Day, MD;  Location: [facility] ORS;  Service: Orthopedics;  Laterality: Right;    Social History   Tobacco Use  . Smoking status: Former Smoker    Packs/day: 0.50    Years: 30.00    Pack years: 15.00    Types: Cigarettes    Quit date: 10/19/2004    Years since quitting: 14.6  . Smokeless tobacco: Never Used  Substance Use Topics  . Alcohol use: Yes    Alcohol/week: 0.0 standard drinks    Comment: occasional     Current Outpatient Medications:  .  amLODipine (NORVASC) 2.5 MG tablet, TAKE 1 TABLET BY MOUTH EVERY DAY, Disp: 90 tablet, Rfl: 3 .  aspirin EC 81 MG tablet, Take 1 tablet (81 mg total) by mouth every morning. Resume 4 days post-op, Disp: , Rfl:  .  atorvastatin (LIPITOR) 10 MG tablet, Take 1 tablet (10 mg total) by mouth at bedtime., Disp: 90 tablet, Rfl: 1 .  carvedilol (COREG) 12.5 MG tablet, TAKE 1 TABLET (12.5 MG TOTAL) BY MOUTH 2 (TWO) TIMES DAILY WITH A MEAL., Disp: 180 tablet, Rfl: 1 .  gabapentin (NEURONTIN) 600 MG tablet, Take 1 tablet (600 mg total) by mouth 3 (three) times daily., Disp: 270 tablet, Rfl: 3 .  hydrochlorothiazide (HYDRODIURIL) 25 MG tablet, TAKE 1 TABLET BY MOUTH EVERY DAY, Disp: 90 tablet, Rfl: 0 .  ketoconazole (NIZORAL) 2 % cream, Apply 1 application topically 2 (two) times daily., Disp: 45 g, Rfl: 3 .  losartan (COZAAR) 100 MG tablet, Take 1 tablet (100 mg total) by mouth daily., Disp: 90 tablet, Rfl: 0 .  metFORMIN (GLUCOPHAGE-XR) 500 MG 24 hr tablet, TAKE 2 TABLETS BY MOUTH EVERY DAY WITH BREAKFAST, Disp: 180 tablet, Rfl: 1 .  sildenafil (REVATIO) 20 MG tablet, Take 2 to 5 tablets 30 minutes prior to intercourse on an empty stomach, Disp: 10 tablet, Rfl:  .  thiamine (VITAMIN B-1) 100 MG tablet, Take 100  mg by mouth daily., Disp: , Rfl:   No Known Allergies  Review of Systems  Constitutional: Negative for chills, fever and malaise/fatigue.  HENT: Negative for congestion, sinus pain and sore throat.   Eyes: Negative for blurred vision.  Respiratory: Negative for cough and shortness of breath.   Cardiovascular: Negative for chest pain, palpitations and leg swelling.  Gastrointestinal: Negative for abdominal pain, constipation, diarrhea and nausea.  Genitourinary: Negative for dysuria.  Musculoskeletal: Negative for falls and joint pain.  Skin: Negative for rash.  Neurological: Negative for dizziness and headaches.  Endo/Heme/Allergies: Negative for polydipsia.  Psychiatric/Behavioral: The patient is not nervous/anxious and does not have insomnia.       No other specific complaints  in a complete review of systems (except as listed in HPI above).  Objective  Vitals:   05/30/19 0814  BP: 120/64  Pulse: 62  Resp: 14  Temp: 98.2 F (36.8 C)  SpO2: 96%  Weight: 279 lb 6.4 oz (126.7 kg)  Height: 5\' 11"  (1.803 m)     Body mass index is 38.97 kg/m.  Nursing Note and Vital Signs reviewed.  Physical Exam Vitals signs reviewed.  Constitutional:      Appearance: He is well-developed.  HENT:     Head: Normocephalic and atraumatic.  Cardiovascular:     Pulses: Normal pulses.     Heart sounds: Normal heart sounds.     Comments: No appreciably lower extremity edema noted Pulmonary:     Effort: Pulmonary effort is normal.     Breath sounds: Normal breath sounds.  Abdominal:     General: Bowel sounds are normal.     Palpations: Abdomen is soft.     Tenderness: There is no abdominal tenderness.  Musculoskeletal: Normal range of motion.  Skin:    General: Skin is warm and dry.     Capillary Refill: Capillary refill takes less than 2 seconds.  Neurological:     Mental Status: He is alert and oriented to person, place, and time.     GCS: GCS eye subscore is 4. GCS verbal  subscore is 5. GCS motor subscore is 6.  Psychiatric:        Speech: Speech normal.        Behavior: Behavior normal.        Thought Content: Thought content normal.        Judgment: Judgment normal.        No results found for this or any previous visit (from the past 48 hour(s)).  Assessment & Plan  1. Essential hypertension Stable, continue meds  2. LVH (left ventricular hypertrophy) Good BP control  3. OSA (obstructive sleep apnea) Recommend sleep hygiene and using CPAP  4. BPH with obstruction/lower urinary tract symptoms Follows up with urology   5. Mixed hyperlipidemia Continue statin  - Lipid Profile  6. Other male erectile dysfunction Continue sildinafil PRN   7. Vitamin D deficiency Discussed supplementation   8. Prediabetes - HgB A1c  9. Medication monitoring encounter - COMPLETE METABOLIC PANEL WITH GFR

## 2019-05-31 LAB — COMPLETE METABOLIC PANEL WITH GFR
AG Ratio: 2 (calc) (ref 1.0–2.5)
ALT: 31 U/L (ref 9–46)
AST: 26 U/L (ref 10–35)
Albumin: 4.2 g/dL (ref 3.6–5.1)
Alkaline phosphatase (APISO): 60 U/L (ref 35–144)
BUN: 19 mg/dL (ref 7–25)
CO2: 29 mmol/L (ref 20–32)
Calcium: 9.3 mg/dL (ref 8.6–10.3)
Chloride: 105 mmol/L (ref 98–110)
Creat: 1.24 mg/dL (ref 0.70–1.33)
GFR, Est African American: 74 mL/min/{1.73_m2} (ref >=60)
GFR, Est Non African American: 64 mL/min/{1.73_m2} (ref >=60)
Globulin: 2.1 g/dL (calc) (ref 1.9–3.7)
Glucose, Bld: 106 mg/dL — ABNORMAL HIGH (ref 65–99)
Potassium: 3.6 mmol/L (ref 3.5–5.3)
Sodium: 143 mmol/L (ref 135–146)
Total Bilirubin: 0.5 mg/dL (ref 0.2–1.2)
Total Protein: 6.3 g/dL (ref 6.1–8.1)

## 2019-05-31 LAB — HEMOGLOBIN A1C
Hgb A1c MFr Bld: 6 % of total Hgb — ABNORMAL HIGH (ref <5.7)
Mean Plasma Glucose: 126 (calc)
eAG (mmol/L): 7 (calc)

## 2019-05-31 LAB — LIPID PANEL
Cholesterol: 99 mg/dL (ref <200)
HDL: 41 mg/dL (ref >=40)
LDL Cholesterol (Calc): 43 mg/dL (calc)
Non-HDL Cholesterol (Calc): 58 mg/dL (calc) (ref <130)
Total CHOL/HDL Ratio: 2.4 (calc) (ref <5.0)
Triglycerides: 72 mg/dL (ref <150)

## 2019-06-15 ENCOUNTER — Encounter (INDEPENDENT_AMBULATORY_CARE_PROVIDER_SITE_OTHER): Payer: Self-pay | Admitting: Nurse Practitioner

## 2019-06-15 ENCOUNTER — Ambulatory Visit (INDEPENDENT_AMBULATORY_CARE_PROVIDER_SITE_OTHER): Payer: BC Managed Care – PPO | Admitting: Nurse Practitioner

## 2019-06-15 ENCOUNTER — Other Ambulatory Visit: Payer: Self-pay

## 2019-06-15 VITALS — BP 123/73 | HR 62 | Resp 16 | Ht 71.0 in | Wt 276.4 lb

## 2019-06-15 DIAGNOSIS — I890 Lymphedema, not elsewhere classified: Secondary | ICD-10-CM | POA: Diagnosis not present

## 2019-06-15 DIAGNOSIS — E782 Mixed hyperlipidemia: Secondary | ICD-10-CM | POA: Diagnosis not present

## 2019-06-15 DIAGNOSIS — I10 Essential (primary) hypertension: Secondary | ICD-10-CM | POA: Diagnosis not present

## 2019-06-15 NOTE — Progress Notes (Signed)
SUBJECTIVE:  [name], male    DOB: 1960-12-04, 58 y.o.   MRN: [id number] Chief Complaint  Patient presents with  . Follow-up    51yr lymphedema follow up    HPI  Marc Stevens is a 58 y.o. male   The patient returns to the office for followup evaluation regarding leg swelling.  The swelling has persisted but with the lymph pump the patient states the swelling is much better controlled. The pain associated with swelling is essentially eliminated. There have not been any interval development of a ulcerations or wounds.  No episodes of cellulitis or infection over the past 12 months  The patient denies problems with the pump, noting it is working well and the leggings are in good condition.  Since the previous visit the patient has been wearing graduated compression stockings and using the lymph pump on a routine basis and  has noted significant improvement in the lymphedema.   Patient stated the lymph pump has been a very positive factor in his care.        Past Medical History:  Diagnosis Date  . BPH (benign prostatic hypertrophy)   . Complete rotator cuff tear 08/08/2015  . Diabetes mellitus without complication ([place])   . ED (erectile dysfunction)   . HTN (hypertension) 04/18/2012  . Hyperlipidemia   . Low testosterone   . Morbidly obese ([place])   . OSA (obstructive sleep apnea)    cpap - does not know settings   . Rotator cuff tear    RT  . Torn Achilles tendon 07/11/2015  . Torn Achilles tendon 07/11/2015    Past Surgical History:  Procedure Laterality Date  . COLONOSCOPY WITH PROPOFOL N/A 02/18/2018   Procedure: COLONOSCOPY WITH PROPOFOL;  Surgeon: Jonathon Bellows, MD;  Location: [facility] ENDOSCOPY;  Service: Gastroenterology;  Laterality: N/A;  . DOPPLER ECHOCARDIOGRAPHY  2013  . left kene arthroscopy     . LUMBAR LAMINECTOMY/DECOMPRESSION MICRODISCECTOMY N/A 01/30/2015   Procedure: MICRO LUMBAR DECOMPRESSION, BILATERAL FORAMINOTOMIES,  MICRODISCECTOMY LUMBAR FOUR TO LUMBAR FIVE;  Surgeon: Susa Day, MD;  Location: [facility] ORS;  Service: Orthopedics;  Laterality: N/A;  . right knee surgery    . ROTATOR CUFF REPAIR  2007   Left  . SHOULDER OPEN ROTATOR CUFF REPAIR Right 08/08/2015   Procedure: RIGHT SHOULDER MINI OPEN ROTATOR CUFF REPAIR SUBACROMIAL DECOMPRESSION ;  Surgeon: Susa Day, MD;  Location: [facility] ORS;  Service: Orthopedics;  Laterality: Right;    Social History   Socioeconomic History  . Marital status: Single    Spouse name: Not on file  . Number of children: Not on file  . Years of education: Not on file  . Highest education level: Not on file  Occupational History    Employer: [employer]    Employer: [employer]  Social Needs  . Financial resource strain: Not on file  . Food insecurity    Worry: Not on file    Inability: Not on file  . Transportation needs    Medical: Not on file    Non-medical: Not on file  Tobacco Use  . Smoking status: Former Smoker    Packs/day: 0.50    Years: 30.00    Pack years: 15.00    Types: Cigarettes    Quit date: 10/19/2004    Years since quitting: 14.6  . Smokeless tobacco: Never Used  Substance and Sexual Activity  . Alcohol use: Yes    Alcohol/week: 0.0 standard drinks    Comment: occasional  .  Drug use: No  . Sexual activity: Yes    Birth control/protection: None  Lifestyle  . Physical activity    Days per week: Not on file    Minutes per session: Not on file  . Stress: Not on file  Relationships  . Social Herbalist on phone: Not on file    Gets together: Not on file    Attends religious service: Not on file    Active member of club or organization: Not on file    Attends meetings of clubs or organizations: Not on file    Relationship status: Not on file  . Intimate partner violence    Fear of current or ex partner: Not on file    Emotionally abused: Not on file    Physically abused: Not on file    Forced sexual activity: Not on file  Other  Topics Concern  . Not on file  Social History Narrative  . Not on file    Family History  Problem Relation Age of Onset  . Dementia Mother   . Hypertension Mother   . Cancer Father        colon  . Heart disease Father   . Congestive Heart Failure Father   . Hypertension Sister   . Dementia Maternal Grandmother   . Kidney disease Neg Hx   . Prostate cancer Neg Hx   . COPD Neg Hx   . Diabetes Neg Hx   . Stroke Neg Hx   . Kidney cancer Neg Hx   . Bladder Cancer Neg Hx     No Known Allergies   Review of Systems   Review of Systems: Negative Unless Checked Constitutional: [] Weight loss  [] Fever  [] Chills Cardiac: [] Chest pain   []  Atrial Fibrillation  [] Palpitations   [] Shortness of breath when laying flat   [] Shortness of breath with exertion. [] Shortness of breath at rest Vascular:  [] Pain in legs with walking   [] Pain in legs with standing [] Pain in legs when laying flat   [] Claudication    [] Pain in feet when laying flat    [] History of DVT   [] Phlebitis   [x] Swelling in legs   [] Varicose veins   [] Non-healing ulcers Pulmonary:   [] Uses home oxygen   [] Productive cough   [] Hemoptysis   [] Wheeze  [] COPD   [] Asthma Neurologic:  [] Dizziness   [] Seizures  [] Blackouts [] History of stroke   [] History of TIA  [] Aphasia   [] Temporary Blindness   [] Weakness or numbness in arm   [x] Weakness or numbness in leg Musculoskeletal:   [] Joint swelling   [] Joint pain   [] Low back pain  []  History of Knee Replacement [] Arthritis [] back Surgeries  []  Spinal Stenosis    Hematologic:  [] Easy bruising  [] Easy bleeding   [] Hypercoagulable state   [] Anemic Gastrointestinal:  [] Diarrhea   [] Vomiting  [] Gastroesophageal reflux/heartburn   [] Difficulty swallowing. [] Abdominal pain Genitourinary:  [] Chronic kidney disease   [] Difficult urination  [] Anuric   [] Blood in urine [] Frequent urination  [] Burning with urination   [] Hematuria Skin:  [] Rashes   [] Ulcers [] Wounds Psychological:  [] History of anxiety    []  History of major depression  []  Memory Difficulties      OBJECTIVE:   Physical Exam  BP 123/73 (BP Location: Right Arm)   Pulse 62   Resp 16   Ht 5\' 11"  (1.803 m)   Wt 276 lb 6.4 oz (125.4 kg)   BMI 38.55 kg/m   Gen: WD/WN, NAD Head: [place]/AT,  No temporalis wasting.  Ear/Nose/Throat: Hearing grossly intact, nares w/o erythema or drainage Eyes: PER, EOMI, sclera nonicteric.  Neck: Supple, no masses.  No JVD.  Pulmonary:  Good air movement, no use of accessory muscles.  Cardiac: RRR Vascular: 2+ edema  Vessel Right Left  Radial Palpable Palpable  Dorsalis Pedis Palpable Palpable  Posterior Tibial Palpable Palpable   Gastrointestinal: soft, non-distended. No guarding/no peritoneal signs.  Musculoskeletal: M/S 5/5 throughout.  No deformity or atrophy.  Neurologic: Pain and light touch intact in extremities.  Symmetrical.  Speech is fluent. Motor exam as listed above. Psychiatric: Judgment intact, Mood & affect appropriate for pt's clinical situation. Dermatologic: bilateral stasis dermatitis No Ulcers Noted.  No changes consistent with cellulitis. Lymph : No Cervical lymphadenopathy, no lichenification or skin changes of chronic lymphedema.       ASSESSMENT AND PLAN:  1. Lymphedema  No surgery or intervention at this point in time.    I have reviewed my discussion with the patient regarding lymphedema and why it  causes symptoms.  Patient will continue wearing graduated compression stockings class 1 (20-30 mmHg) on a daily basis a prescription was given. The patient is reminded to put the stockings on first thing in the morning and removing them in the evening. The patient is instructed specifically not to sleep in the stockings.   In addition, behavioral modification throughout the day will be continued.  This will include frequent elevation (such as in a recliner), use of over the counter pain medications as needed and exercise such as walking.  I have reviewed systemic  causes for chronic edema such as liver, kidney and cardiac etiologies and there does not appear to be any significant changes in these organ systems over the past year.  The patient is under the impression that these organ systems are all stable and unchanged.    The patient will continue aggressive use of the  lymph pump.  This will continue to improve the edema control and prevent sequela such as ulcers and infections.   The patient will follow-up with me on an annual basis.    2. Essential hypertension Continue antihypertensive medications as already ordered, these medications have been reviewed and there are no changes at this time.   3. Mixed hyperlipidemia Continue statin as ordered and reviewed, no changes at this time    Current Outpatient Medications on File Prior to Visit  Medication Sig Dispense Refill  . amLODipine (NORVASC) 2.5 MG tablet TAKE 1 TABLET BY MOUTH EVERY DAY 90 tablet 3  . aspirin EC 81 MG tablet Take 1 tablet (81 mg total) by mouth every morning. Resume 4 days post-op    . atorvastatin (LIPITOR) 10 MG tablet Take 1 tablet (10 mg total) by mouth at bedtime. 90 tablet 1  . carvedilol (COREG) 12.5 MG tablet TAKE 1 TABLET (12.5 MG TOTAL) BY MOUTH 2 (TWO) TIMES DAILY WITH A MEAL. 180 tablet 1  . gabapentin (NEURONTIN) 600 MG tablet Take 1 tablet (600 mg total) by mouth 3 (three) times daily. 270 tablet 3  . hydrochlorothiazide (HYDRODIURIL) 25 MG tablet TAKE 1 TABLET BY MOUTH EVERY DAY 90 tablet 0  . ketoconazole (NIZORAL) 2 % cream Apply 1 application topically 2 (two) times daily. 45 g 3  . losartan (COZAAR) 100 MG tablet Take 1 tablet (100 mg total) by mouth daily. 90 tablet 0  . metFORMIN (GLUCOPHAGE-XR) 500 MG 24 hr tablet TAKE 2 TABLETS BY MOUTH EVERY DAY WITH BREAKFAST 180 tablet 1  .  sildenafil (REVATIO) 20 MG tablet Take 2 to 5 tablets 30 minutes prior to intercourse on an empty stomach 10 tablet   . thiamine (VITAMIN B-1) 100 MG tablet Take 100 mg by mouth  daily.     No current facility-administered medications on file prior to visit.     There are no Patient Instructions on file for this visit. No follow-ups on file.   Kris Hartmann, NP  This note was completed with Sales executive.  Any errors are purely unintentional.

## 2019-07-05 ENCOUNTER — Other Ambulatory Visit: Payer: Self-pay | Admitting: Family Medicine

## 2019-07-05 DIAGNOSIS — N138 Other obstructive and reflux uropathy: Secondary | ICD-10-CM

## 2019-07-06 ENCOUNTER — Other Ambulatory Visit: Payer: Self-pay

## 2019-07-06 ENCOUNTER — Ambulatory Visit: Payer: BLUE CROSS/BLUE SHIELD | Admitting: Urology

## 2019-07-06 ENCOUNTER — Other Ambulatory Visit
Admission: RE | Admit: 2019-07-06 | Discharge: 2019-07-06 | Disposition: A | Discharge status: Home / Self Care | Payer: BC Managed Care – PPO | Attending: Urology | Admitting: Urology

## 2019-07-06 DIAGNOSIS — N138 Other obstructive and reflux uropathy: Secondary | ICD-10-CM | POA: Insufficient documentation

## 2019-07-06 DIAGNOSIS — N401 Enlarged prostate with lower urinary tract symptoms: Secondary | ICD-10-CM | POA: Insufficient documentation

## 2019-07-06 LAB — PSA: Prostatic Specific Antigen: 1.12 ng/mL (ref 0.00–4.00)

## 2019-07-06 NOTE — Progress Notes (Signed)
9:09 AM   Marc Stevens 09/14/1961 [id number]  Referring provider: Arnetha Courser, MD [address] [place],  [place] [address]  Chief Complaint  Patient presents with  . Erectile Dysfunction    HPI: Patient is a 58 year old male with erectile dysfunction, and mild BPH with LUTS who presents today for 1 year follow-up.  ED His SHIM score is 11, which is moderate ED.  His previous SHIM was 13.  His major complaint is maintaining an erection.     His risk factors for ED are HTN, sleep apnea, BPH, obesity, hyperlipidemia and age.  He denies any painful erections or curvatures with his erections.   He has tried PDE5-inhibitors in the past with mild success.  He is finding that the sildenafil is becoming less effective.  He is still having sleep apnea.   [place] Name 07/10/19 0840         SHIM: Over the last 6 months:   How do you rate your confidence that you could get and keep an erection?  Low     When you had erections with sexual stimulation, how often were your erections hard enough for penetration (entering your partner)?  A Few Times (much less than half the time)     During sexual intercourse, how often were you able to maintain your erection after you had penetrated (entered) your partner?  A Few Times (much less than half the time)     During sexual intercourse, how difficult was it to maintain your erection to completion of intercourse?  Difficult     When you attempted sexual intercourse, how often was it satisfactory for you?  A Few Times (much less than half the time)       SHIM Total Score   SHIM  11        Score: 1-7 Severe ED 8-11 Moderate ED 12-16 Mild-Moderate ED 17-21 Mild ED 22-25 No ED  BPH WITH LUTS  (prostate and/or bladder) IPSS score: 5/3   Previous score: 2/2     Major complaint(s):  Mild intermittency over the last year. Denies any dysuria, hematuria or suprapubic pain.    Denies any recent fevers, chills, nausea or  vomiting.  ? Father having prostate cancer.    IPSS    Row Name 07/10/19 0800         International Prostate Symptom Score   How often have you had the sensation of not emptying your bladder?  Not at All     How often have you had to urinate less than every two hours?  Less than 1 in 5 times     How often have you found you stopped and started again several times when you urinated?  Less than 1 in 5 times     How often have you found it difficult to postpone urination?  Not at All     How often have you had a weak urinary stream?  Less than half the time     How often have you had to strain to start urination?  Not at All     How many times did you typically get up at night to urinate?  1 Time     Total IPSS Score  5       Quality of Life due to urinary symptoms   If you were to spend the rest of your life with your urinary condition just the way it is now  how would you feel about that?  Mixed        Score:  1-7 Mild 8-19 Moderate 20-35 Severe  PMH: Past Medical History:  Diagnosis Date  . BPH (benign prostatic hypertrophy)   . Complete rotator cuff tear 08/08/2015  . Diabetes mellitus without complication ([place])   . ED (erectile dysfunction)   . HTN (hypertension) 04/18/2012  . Hyperlipidemia   . Low testosterone   . Morbidly obese ([place])   . OSA (obstructive sleep apnea)    cpap - does not know settings   . Rotator cuff tear    RT  . Torn Achilles tendon 07/11/2015  . Torn Achilles tendon 07/11/2015    Surgical History: Past Surgical History:  Procedure Laterality Date  . COLONOSCOPY WITH PROPOFOL N/A 02/18/2018   Procedure: COLONOSCOPY WITH PROPOFOL;  Surgeon: Jonathon Bellows, MD;  Location: [facility] ENDOSCOPY;  Service: Gastroenterology;  Laterality: N/A;  . DOPPLER ECHOCARDIOGRAPHY  2013  . left kene arthroscopy     . LUMBAR LAMINECTOMY/DECOMPRESSION MICRODISCECTOMY N/A 01/30/2015   Procedure: MICRO LUMBAR DECOMPRESSION, BILATERAL FORAMINOTOMIES, MICRODISCECTOMY LUMBAR  FOUR TO LUMBAR FIVE;  Surgeon: Susa Day, MD;  Location: [facility] ORS;  Service: Orthopedics;  Laterality: N/A;  . right knee surgery    . ROTATOR CUFF REPAIR  2007   Left  . SHOULDER OPEN ROTATOR CUFF REPAIR Right 08/08/2015   Procedure: RIGHT SHOULDER MINI OPEN ROTATOR CUFF REPAIR SUBACROMIAL DECOMPRESSION ;  Surgeon: Susa Day, MD;  Location: [facility] ORS;  Service: Orthopedics;  Laterality: Right;    Home Medications:  Allergies as of 07/10/2019   No Known Allergies     Medication List       Accurate as of July 10, 2019  9:09 AM. If you have any questions, ask your nurse or doctor.        amLODipine 2.5 MG tablet Commonly known as: NORVASC TAKE 1 TABLET BY MOUTH EVERY DAY   aspirin EC 81 MG tablet Take 1 tablet (81 mg total) by mouth every morning. Resume 4 days post-op   atorvastatin 10 MG tablet Commonly known as: LIPITOR Take 1 tablet (10 mg total) by mouth at bedtime.   carvedilol 12.5 MG tablet Commonly known as: COREG TAKE 1 TABLET (12.5 MG TOTAL) BY MOUTH 2 (TWO) TIMES DAILY WITH A MEAL.   gabapentin 600 MG tablet Commonly known as: Neurontin Take 1 tablet (600 mg total) by mouth 3 (three) times daily.   hydrochlorothiazide 25 MG tablet Commonly known as: HYDRODIURIL TAKE 1 TABLET BY MOUTH EVERY DAY   ketoconazole 2 % cream Commonly known as: NIZORAL Apply 1 application topically 2 (two) times daily.   losartan 100 MG tablet Commonly known as: COZAAR Take 1 tablet (100 mg total) by mouth daily.   metFORMIN 500 MG 24 hr tablet Commonly known as: GLUCOPHAGE-XR TAKE 2 TABLETS BY MOUTH EVERY DAY WITH BREAKFAST   sildenafil 100 MG tablet Commonly known as: VIAGRA Take 1 tablet (100 mg total) by mouth daily as needed for erectile dysfunction. Started by: Zara Council, PA-C   sildenafil 20 MG tablet Commonly known as: REVATIO Take 2 to 5 tablets 30 minutes prior to intercourse on an empty stomach   thiamine 100 MG tablet Commonly known as:  VITAMIN B-1 Take 100 mg by mouth daily.       Allergies: No Known Allergies  Family History: Family History  Problem Relation Age of Onset  . Dementia Mother   . Hypertension Mother   . Cancer Father  colon  . Heart disease Father   . Congestive Heart Failure Father   . Hypertension Sister   . Dementia Maternal Grandmother   . Kidney disease Neg Hx   . Prostate cancer Neg Hx   . COPD Neg Hx   . Diabetes Neg Hx   . Stroke Neg Hx   . Kidney cancer Neg Hx   . Bladder Cancer Neg Hx     Social History:  reports that he quit smoking about 14 years ago. His smoking use included cigarettes. He has a 15.00 pack-year smoking history. He has never used smokeless tobacco. He reports current alcohol use. He reports that he does not use drugs.  ROS: UROLOGY Frequent Urination?: No Hard to postpone urination?: No Burning/pain with urination?: No Get up at night to urinate?: No Leakage of urine?: No Urine stream starts and stops?: No Trouble starting stream?: No Do you have to strain to urinate?: No Blood in urine?: No Urinary tract infection?: No Sexually transmitted disease?: No Injury to kidneys or bladder?: No Painful intercourse?: No Weak stream?: No Erection problems?: No Penile pain?: No  Gastrointestinal Nausea?: No Vomiting?: No Indigestion/heartburn?: No Diarrhea?: No Constipation?: No  Constitutional Fever: No Night sweats?: No Weight loss?: No Fatigue?: No  Skin Skin rash/lesions?: No Itching?: No  Eyes Blurred vision?: Yes Double vision?: No  Ears/Nose/Throat Sore throat?: No Sinus problems?: No  Hematologic/Lymphatic Swollen glands?: No Easy bruising?: No  Cardiovascular Leg swelling?: No Chest pain?: No  Respiratory Cough?: No Shortness of breath?: No  Endocrine Excessive thirst?: No  Musculoskeletal Back pain?: Yes Joint pain?: Yes  Neurological Headaches?: No Dizziness?: No  Psychologic Depression?: No Anxiety?:  No  Physical Exam: Blood pressure 135/79, pulse 61, height 5\' 11"  (1.803 m), weight 280 lb (127 kg). Constitutional:  Well nourished. Alert and oriented, No acute distress. HEENT: [place] AT, moist mucus membranes.  Trachea midline, no masses. Cardiovascular: No clubbing, cyanosis, or edema. Respiratory: Normal respiratory effort, no increased work of breathing. GI: Abdomen is soft, non tender, non distended, no abdominal masses. Liver and spleen not palpable.  Umbilical hernias appreciated.  Stool sample for occult testing is not indicated.   GU: No CVA tenderness.  No bladder fullness or masses.  Patient with uncircumcised phallus.  Foreskin easily retracted.  Urethral meatus is patent.  No penile discharge. No penile lesions or rashes. Scrotum without lesions, cysts, rashes and/or edema.  Testicles are located scrotally bilaterally. No masses are appreciated in the testicles. Left and right epididymis are normal. Rectal: Patient with  normal sphincter tone. Anus and perineum without scarring or rashes. No rectal masses are appreciated. Prostate is approximately 50 grams, could only palpate the apex, no nodules are appreciated. Seminal vesicles could not be palpated.   Skin: No rashes, bruises or suspicious lesions. Lymph: No inguinal adenopathy. Neurologic: Grossly intact, no focal deficits, moving all 4 extremities. Psychiatric: Normal mood and affect.   Laboratory Data: PSA Trend  1.8 in 02/2013  1.2 in 02/2016  1.4 in 05/2016  1.39 in 05/2017  1.2 in 05/2018  Results for orders placed or performed during the hospital encounter of 07/06/19  PSA  Result Value Ref Range   Prostatic Specific Antigen 1.12 0.00 - 4.00 ng/mL    Lab Results  Component Value Date   CREATININE 1.24 05/30/2019    I have reviewed the labs  Assessment & Plan:    1. Erectile dysfunction SHIM score is 11, it is improved  Continue sildenafil - refills given  Will check testosterone level as he is diabetic  per AUA guidelines RTC in one year for SHIM and exam   2 . BPH with LUTS IPSS score is 5/3, it is worsening Continue conservative management, avoiding bladder irritants and timed voiding's Most bothersome symptoms is/are intermittency He is not wanting to start a medication at this time RTC in 12 months for IPSS, PSA, PVR and exam      Return in about 1 year (around 07/09/2020) for IPSS, SHIM, PSA and exam.  Zara Council, [facility]  [address] [place], [place] [address] [phone]

## 2019-07-10 ENCOUNTER — Ambulatory Visit: Payer: BC Managed Care – PPO | Admitting: Urology

## 2019-07-10 ENCOUNTER — Other Ambulatory Visit: Payer: Self-pay

## 2019-07-10 ENCOUNTER — Encounter: Payer: Self-pay | Admitting: Urology

## 2019-07-10 ENCOUNTER — Other Ambulatory Visit
Admission: RE | Admit: 2019-07-10 | Discharge: 2019-07-10 | Disposition: A | Discharge status: Home / Self Care | Payer: BC Managed Care – PPO | Attending: Urology | Admitting: Urology

## 2019-07-10 VITALS — BP 135/79 | HR 61 | Ht 71.0 in | Wt 280.0 lb

## 2019-07-10 DIAGNOSIS — N401 Enlarged prostate with lower urinary tract symptoms: Secondary | ICD-10-CM

## 2019-07-10 DIAGNOSIS — N138 Other obstructive and reflux uropathy: Secondary | ICD-10-CM

## 2019-07-10 DIAGNOSIS — N529 Male erectile dysfunction, unspecified: Secondary | ICD-10-CM

## 2019-07-10 MED ORDER — SILDENAFIL CITRATE 100 MG PO TABS: 100.0000 mg | ORAL_TABLET | Freq: Every day | ORAL | 3 refills | Status: DC | PRN

## 2019-07-11 LAB — TESTOSTERONE: Testosterone: 554 ng/dL (ref 264–916)

## 2019-07-11 NOTE — Telephone Encounter (Signed)
-----   Message from Nori Riis, PA-C sent at 07/11/2019  8:12 AM EDT ----- Please let Mr. Slider know that his testosterone level was normal.

## 2019-08-09 ENCOUNTER — Other Ambulatory Visit: Payer: Self-pay

## 2019-08-09 ENCOUNTER — Other Ambulatory Visit: Payer: Self-pay | Admitting: Family Medicine

## 2019-08-09 DIAGNOSIS — E782 Mixed hyperlipidemia: Secondary | ICD-10-CM

## 2019-08-09 MED ORDER — ATORVASTATIN CALCIUM 10 MG PO TABS: 10.0000 mg | ORAL_TABLET | Freq: Every day | ORAL | 0 refills | Status: DC

## 2019-08-12 ENCOUNTER — Other Ambulatory Visit: Payer: Self-pay | Admitting: Family Medicine

## 2019-08-12 DIAGNOSIS — E782 Mixed hyperlipidemia: Secondary | ICD-10-CM

## 2019-09-01 ENCOUNTER — Other Ambulatory Visit: Payer: Self-pay

## 2019-09-01 ENCOUNTER — Ambulatory Visit (INDEPENDENT_AMBULATORY_CARE_PROVIDER_SITE_OTHER): Payer: BC Managed Care – PPO | Admitting: Family Medicine

## 2019-09-01 ENCOUNTER — Encounter: Payer: Self-pay | Admitting: Family Medicine

## 2019-09-01 VITALS — BP 122/74 | HR 79 | Temp 97.9°F | Resp 14 | Ht 71.0 in | Wt 276.0 lb

## 2019-09-01 DIAGNOSIS — Z0000 Encounter for general adult medical examination without abnormal findings: Secondary | ICD-10-CM

## 2019-09-01 IMAGING — CR DG HAND COMPLETE 3+V*L*
3 series · 3 of 3 positions shown · non-contrast
Comparison: None.

CLINICAL DATA: Initial evaluation for acute trauma, laceration at
left thumb.

EXAM:
LEFT HAND - COMPLETE 3+ VIEW

[x hand pa left]
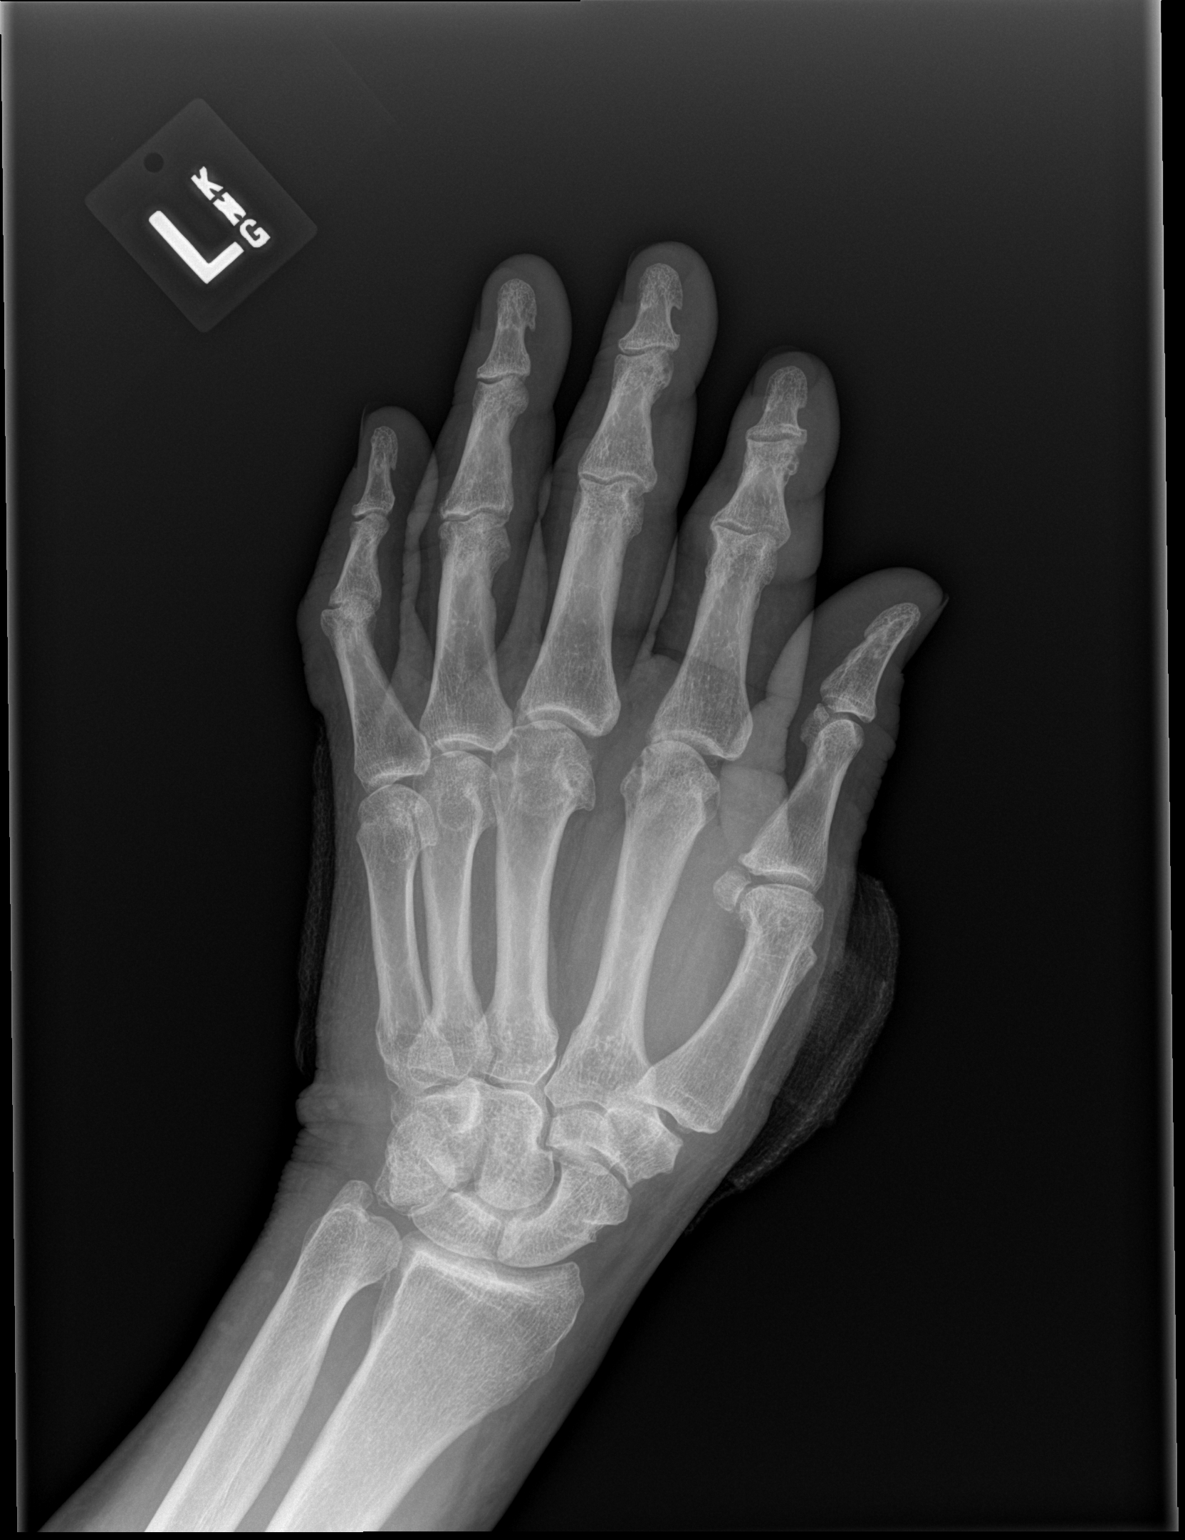

[x hand obl left]
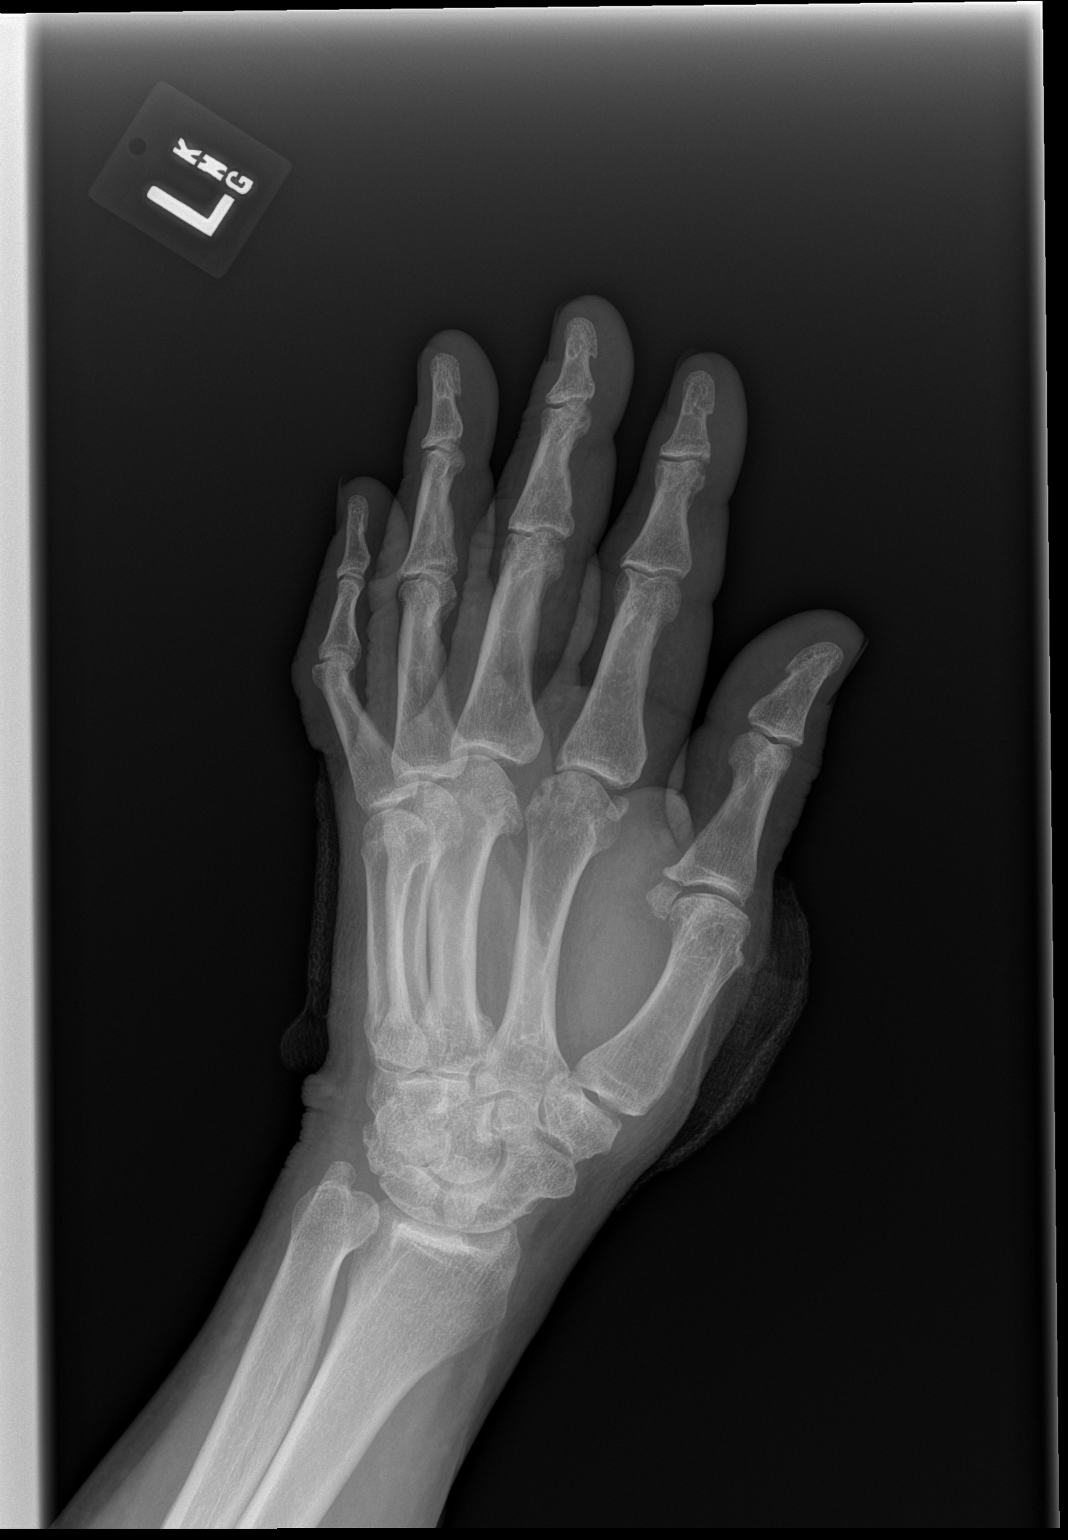

[x hand lat left]
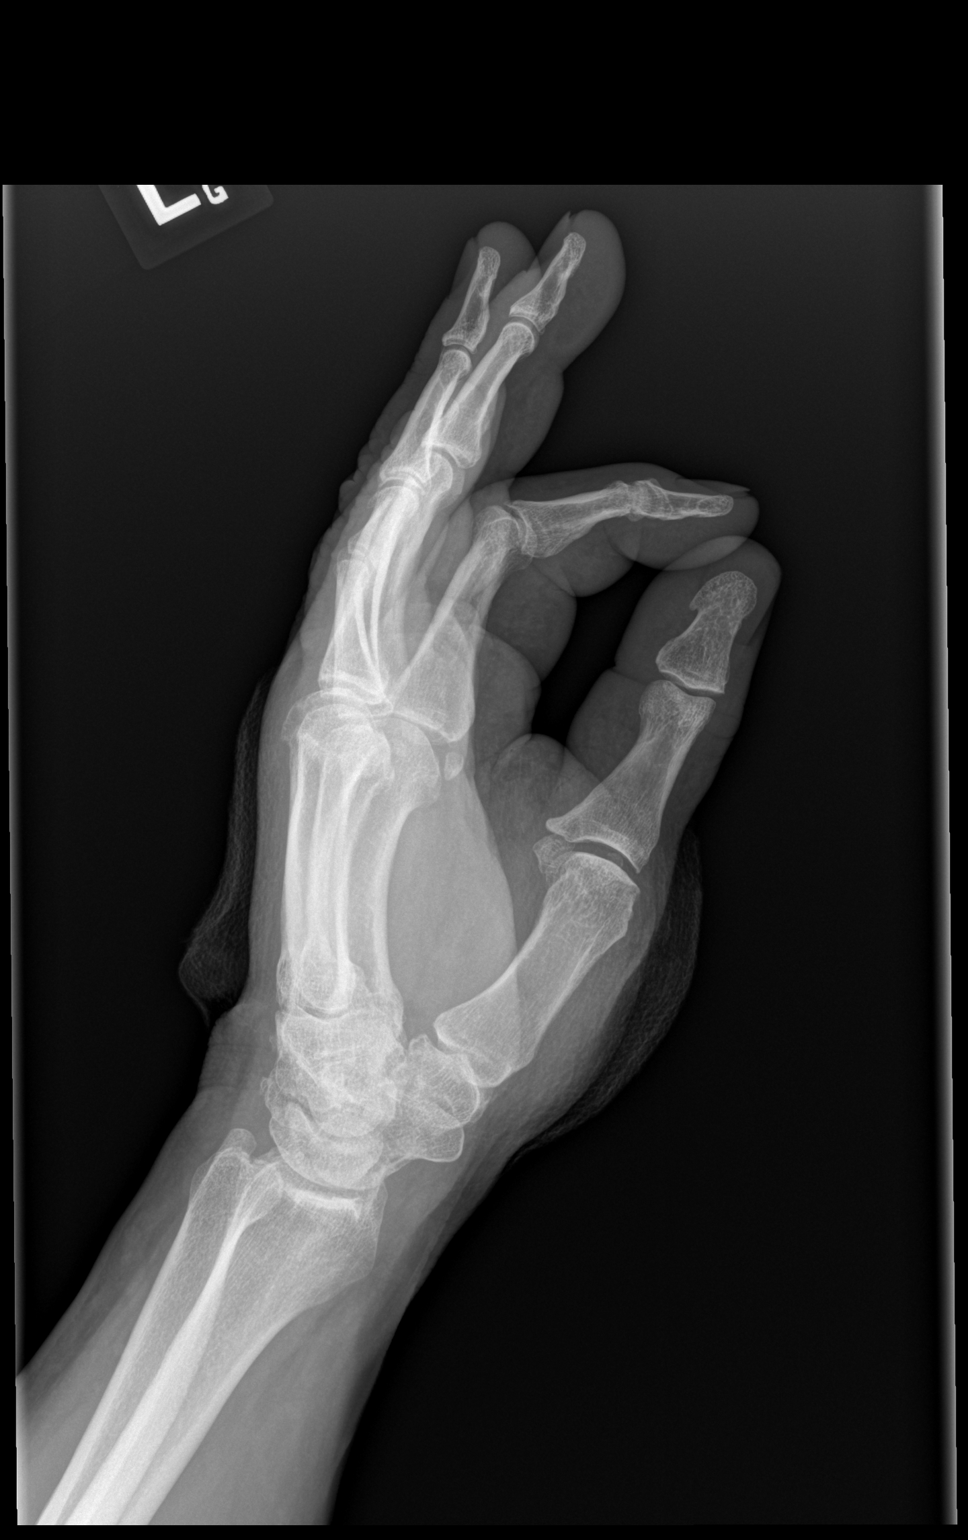

[3 of 3 positions shown; findings below may reference images not displayed]

FINDINGS: Bandaging material overlies the left hand. Known soft tissue
laceration not well seen. No radiopaque foreign body. No acute
fracture or dislocation. Moderate osteoarthritic changes present
throughout the hand.
IMPRESSION: 1. Soft tissue laceration at the level of the left thumb. No
radiopaque foreign body.
2. No acute osseous abnormality.

## 2019-09-01 NOTE — Progress Notes (Signed)
Name: Marc Stevens   MRN: [id number]    DOB: 09-22-1961   Date:09/01/2019       Progress Note  Subjective  Chief Complaint  Chief Complaint  Patient presents with  . Annual Exam    HPI  Patient presents for annual CPE.  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  Depression: phq 9 is negative Depression screen [facility] 2/9 09/01/2019 05/30/2019 02/17/2019 11/18/2018 08/12/2018  Decreased Interest 0 0 0 0 0  Down, Depressed, Hopeless 0 0 0 0 0  PHQ - 2 Score 0 0 0 0 0  Altered sleeping 0 0 0 0 0  Tired, decreased energy 0 0 0 0 0  Change in appetite 0 0 1 0 0  Feeling bad or failure about yourself  0 0 2 0 0  Trouble concentrating 0 0 0 0 0  Moving slowly or fidgety/restless 0 0 0 0 0  Suicidal thoughts 0 0 0 0 0  PHQ-9 Score 0 0 3 0 0  Difficult doing work/chores - Not difficult at all Not difficult at all Not difficult at all Not difficult at all    Hypertension:  BP Readings from Last 3 Encounters:  09/01/19 122/74  07/10/19 135/79  06/15/19 123/73    Obesity: Wt Readings from Last 3 Encounters:  09/01/19 276 lb (125.2 kg)  07/10/19 280 lb (127 kg)  06/15/19 276 lb 6.4 oz (125.4 kg)   BMI Readings from Last 3 Encounters:  09/01/19 38.49 kg/m  07/10/19 39.05 kg/m  06/15/19 38.55 kg/m     Lipids:  Lab Results  Component Value Date   CHOL 99 05/30/2019   CHOL 139 11/18/2018   CHOL 128 05/13/2018   Lab Results  Component Value Date   HDL 41 05/30/2019   HDL 46 11/18/2018   HDL 40 (L) 05/13/2018   Lab Results  Component Value Date   LDLCALC 43 05/30/2019   LDLCALC 78 11/18/2018   LDLCALC 73 05/13/2018   Lab Results  Component Value Date   TRIG 72 05/30/2019   TRIG 71 11/18/2018   TRIG 71 05/13/2018   Lab Results  Component Value Date   CHOLHDL 2.4 05/30/2019   CHOLHDL 3.0 11/18/2018   CHOLHDL 3.2 05/13/2018   No results found  for: LDLDIRECT Glucose:  Glucose  Date Value Ref Range Status  04/04/2014 96 65 - 99 mg/dL Final   Glucose, Bld  Date Value Ref Range Status  05/30/2019 106 (H) 65 - 99 mg/dL Final    Comment:    .            Fasting reference interval . For someone without known diabetes, a glucose value between 100 and 125 mg/dL is consistent with prediabetes and should be confirmed with a follow-up test. .   11/18/2018 91 65 - 99 mg/dL Final    Comment:    .            Fasting reference interval .   05/13/2018 97 65 - 99 mg/dL Final    Comment:    .            Fasting reference interval .       Office Visit from 09/01/2019 in [facility]  AUDIT-C Score  0    4 drinks a week at most.  Single STD testing and prevention (HIV/chl/gon/syphilis): Not sexually active; declines  testing. Hep C: Not done previously, declines today, but will consider when other labs are due.  Skin cancer: No concerning lesions; discussed monitoring for atypical lesion.  Colorectal cancer: He is unsure if his Dad had CRC. Personal history of colorectal cancer, no changes in BM's - no blood in stool, dark and tarry stool, mucus in stool, or constipation/diarrhea.  Prostate cancer: Most recent PSA in September was WNL at 1.12.  Does have BPH. IPSS Questionnaire (AUA-7): Over the past month.   1)  How often have you had a sensation of not emptying your bladder completely after you finish urinating?  0 - Not at all  2)  How often have you had to urinate again less than two hours after you finished urinating? 2 - Less than half the time  3)  How often have you found you stopped and started again several times when you urinated?  0 - Not at all  4) How difficult have you found it to postpone urination?  0 - Not at all  5) How often have you had a weak urinary stream?  1 - Less than 1 time in 5  6) How often have you had to push or strain to begin urination?  0 - Not at all  7) How many times  did you most typically get up to urinate from the time you went to bed until the time you got up in the morning?  1 - 1 time  Total score:  0-7 mildly symptomatic   8-19 moderately symptomatic   20-35 severely symptomatic  Score of 4.   Lung cancer: Quit smoking in 2006 has 15 pack year history.  Low Dose CT Chest recommended if Age 19-80 years, 30 pack-year currently smoking OR have quit w/in 15years. Patient does not qualify.   AAA: N/A until 65yoThe USPSTF recommends one-time screening with ultrasonography in men ages 39 to 79 years who have ever smoked ECG:  On file from 2019; denies chest pain, palpitations, or shortness of breath (has occasional exertional shortness of breath)  Advanced Care Planning: A voluntary discussion about advance care planning including the explanation and discussion of advance directives.  Discussed health care proxy and Living will, and the patient was able to identify a health care proxy as Timoteo Ace (Sister).  Patient does not have a living will at present time. If patient does have living will, I have requested they bring this to the clinic to be scanned in to their chart.  Patient Active Problem List   Diagnosis Date Noted  . Lymphedema 06/16/2018  . 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 Prediabetes 08/13/2017  . Mitral regurgitation 01/17/2016  . LVH (left ventricular hypertrophy) 01/01/2016  . Low testosterone 05/14/2015  . 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 Vitamin D deficiency 04/26/2015  . Beta thalassemia minor 04/26/2015  . Morbid obesity ([place])   . ED (erectile dysfunction)   . Hyperlipidemia   . OSA (obstructive sleep apnea)   . HTN (hypertension) 04/18/2012    Past Surgical History:  Procedure Laterality Date  . COLONOSCOPY WITH PROPOFOL N/A 02/18/2018   Procedure: COLONOSCOPY WITH PROPOFOL;  Surgeon: Jonathon Bellows, MD;  Location: [facility] ENDOSCOPY;  Service: Gastroenterology;  Laterality: N/A;  . DOPPLER  ECHOCARDIOGRAPHY  2013  . left kene arthroscopy     . LUMBAR LAMINECTOMY/DECOMPRESSION MICRODISCECTOMY N/A 01/30/2015   Procedure: MICRO LUMBAR DECOMPRESSION, BILATERAL FORAMINOTOMIES, MICRODISCECTOMY LUMBAR FOUR TO LUMBAR FIVE;  Surgeon: Susa Day, MD;  Location: [facility]  ORS;  Service: Orthopedics;  Laterality: N/A;  . right knee surgery    . ROTATOR CUFF REPAIR  2007   Left  . SHOULDER OPEN ROTATOR CUFF REPAIR Right 08/08/2015   Procedure: RIGHT SHOULDER MINI OPEN ROTATOR CUFF REPAIR SUBACROMIAL DECOMPRESSION ;  Surgeon: Susa Day, MD;  Location: [facility] ORS;  Service: Orthopedics;  Laterality: Right;   Family History  Problem Relation Age of Onset  . Dementia Mother   . Hypertension Mother   . Cancer Father        colon  . Heart disease Father   . Congestive Heart Failure Father   . Hypertension Sister   . Dementia Maternal Grandmother   . Kidney disease Neg Hx   . Prostate cancer Neg Hx   . COPD Neg Hx   . Diabetes Neg Hx   . Stroke Neg Hx   . Kidney cancer Neg Hx   . Bladder Cancer Neg Hx     Social History   Socioeconomic History  . Marital status: Single    Spouse name: Not on file  . Number of children: Not on file  . Years of education: Not on file  . Highest education level: Not on file  Occupational History    Employer: [employer]    Employer: [employer]  Social Needs  . Financial resource strain: Not on file  . Food insecurity    Worry: Not on file    Inability: Not on file  . Transportation needs    Medical: Not on file    Non-medical: Not on file  Tobacco Use  . Smoking status: Former Smoker    Packs/day: 0.50    Years: 30.00    Pack years: 15.00    Types: Cigarettes    Quit date: 10/19/2004    Years since quitting: 14.8  . Smokeless tobacco: Never Used  Substance and Sexual Activity  . Alcohol use: Yes    Alcohol/week: 0.0 standard drinks    Comment: occasional  . Drug use: No  . Sexual activity: Yes    Birth control/protection: None  Lifestyle  .  Physical activity    Days per week: Not on file    Minutes per session: Not on file  . Stress: Not on file  Relationships  . Social Herbalist on phone: Not on file    Gets together: Not on file    Attends religious service: Not on file    Active member of club or organization: Not on file    Attends meetings of clubs or organizations: Not on file    Relationship status: Not on file  . Intimate partner violence    Fear of current or ex partner: Not on file    Emotionally abused: Not on file    Physically abused: Not on file    Forced sexual activity: Not on file  Other Topics Concern  . Not on file  Social History Narrative  . Not on file     Current Outpatient Medications:  .  amLODipine (NORVASC) 2.5 MG tablet, TAKE 1 TABLET BY MOUTH EVERY DAY, Disp: 90 tablet, Rfl: 3 .  aspirin EC 81 MG tablet, Take 1 tablet (81 mg total) by mouth every morning. Resume 4 days post-op, Disp: , Rfl:  .  atorvastatin (LIPITOR) 10 MG tablet, Take 1 tablet (10 mg total) by mouth at bedtime., Disp: 30 tablet, Rfl: 0 .  carvedilol (COREG) 12.5 MG tablet, TAKE 1 TABLET (12.5 MG TOTAL) BY  MOUTH 2 (TWO) TIMES DAILY WITH A MEAL., Disp: 180 tablet, Rfl: 1 .  gabapentin (NEURONTIN) 600 MG tablet, Take 1 tablet (600 mg total) by mouth 3 (three) times daily., Disp: 270 tablet, Rfl: 3 .  hydrochlorothiazide (HYDRODIURIL) 25 MG tablet, TAKE 1 TABLET BY MOUTH EVERY DAY, Disp: 90 tablet, Rfl: 0 .  ketoconazole (NIZORAL) 2 % cream, Apply 1 application topically 2 (two) times daily., Disp: 45 g, Rfl: 3 .  losartan (COZAAR) 100 MG tablet, Take 1 tablet (100 mg total) by mouth daily., Disp: 90 tablet, Rfl: 0 .  metFORMIN (GLUCOPHAGE-XR) 500 MG 24 hr tablet, TAKE 2 TABLETS BY MOUTH EVERY DAY WITH BREAKFAST, Disp: 180 tablet, Rfl: 1 .  sildenafil (VIAGRA) 100 MG tablet, Take 1 tablet (100 mg total) by mouth daily as needed for erectile dysfunction., Disp: 30 tablet, Rfl: 3 .  sildenafil (REVATIO) 20 MG tablet,  Take 2 to 5 tablets 30 minutes prior to intercourse on an empty stomach (Patient not taking: Reported on 09/01/2019), Disp: 10 tablet, Rfl:  .  thiamine (VITAMIN B-1) 100 MG tablet, Take 100 mg by mouth daily., Disp: , Rfl:   No Known Allergies   ROS  Constitutional: Negative for fever or weight change.  Respiratory: Negative for cough and shortness of breath.   Cardiovascular: Negative for chest pain or palpitations.  Gastrointestinal: Negative for abdominal pain, no bowel changes.  Musculoskeletal: Negative for gait problem or joint swelling.  Skin: Negative for rash.  Neurological: Negative for dizziness or headache.  No other specific complaints in a complete review of systems (except as listed in HPI above).   Objective  Vitals:   09/01/19 0813  BP: 122/74  Pulse: 79  Resp: 14  Temp: 97.9 F (36.6 C)  SpO2: 96%  Weight: 276 lb (125.2 kg)  Height: 5\' 11"  (1.803 m)    Body mass index is 38.49 kg/m.  Physical Exam  Constitutional: Patient appears well-developed and well-nourished. No distress.  HENT: Head: Normocephalic and atraumatic. Ears: B TMs ok, no erythema or effusion; Nose: Nose normal. Mouth/Throat: Oropharynx is clear and moist. No oropharyngeal exudate.  Eyes: Conjunctivae and EOM are normal. Pupils are equal, round, and reactive to light. No scleral icterus.  Neck: Normal range of motion. Neck supple. No JVD present. No thyromegaly present.  Cardiovascular: Normal rate, regular rhythm and normal heart sounds.  No murmur heard. No BLE edema. Pulmonary/Chest: Effort normal and breath sounds normal. No respiratory distress. Abdominal: Soft. Bowel sounds are normal, no distension. There is no tenderness. no masses MALE GENITALIA: Deferred RECTAL: Deferred Musculoskeletal: Normal range of motion, no joint effusions. No gross deformities Neurological: he is alert and oriented to person, place, and time. No cranial nerve deficit. Coordination, balance, strength,  speech and gait are normal.  Skin: Skin is warm and dry. No rash noted. No erythema.  Psychiatric: Patient has a normal mood and affect. behavior is normal. Judgment and thought content normal.   Recent Results (from the past 2160 hour(s))  PSA     Status: None   Collection Time: 07/06/19  8:33 AM  Result Value Ref Range   Prostatic Specific Antigen 1.12 0.00 - 4.00 ng/mL    Comment: (NOTE) While PSA levels of <=4.0 ng/ml are reported as reference range, some men with levels below 4.0 ng/ml can have prostate cancer and many men with PSA above 4.0 ng/ml do not have prostate cancer.  Other tests such as free PSA, age specific reference ranges, PSA velocity and PSA  doubling time may be helpful especially in men less than 43 years old. Performed at [facility], [place] [address], [place], [place] [address]   Testosterone     Status: None   Collection Time: 07/10/19  9:29 AM  Result Value Ref Range   Testosterone 554 264 - 916 ng/dL    Comment: (NOTE) Adult male reference interval is based on a population of healthy nonobese males (BMI <30) between 31 and 65 years old. [place], [place] [phone]. PMID: [id number]. Performed At: [facility] [place], [place] [id number] Rush Farmer MD [id number]      PHQ2/9: Depression screen [facility] 2/9 09/01/2019 05/30/2019 02/17/2019 11/18/2018 08/12/2018  Decreased Interest 0 0 0 0 0  Down, Depressed, Hopeless 0 0 0 0 0  PHQ - 2 Score 0 0 0 0 0  Altered sleeping 0 0 0 0 0  Tired, decreased energy 0 0 0 0 0  Change in appetite 0 0 1 0 0  Feeling bad or failure about yourself  0 0 2 0 0  Trouble concentrating 0 0 0 0 0  Moving slowly or fidgety/restless 0 0 0 0 0  Suicidal thoughts 0 0 0 0 0  PHQ-9 Score 0 0 3 0 0  Difficult doing work/chores - Not difficult at all Not difficult at all Not difficult at all Not difficult at all   Fall Risk: Fall Risk  09/01/2019 05/30/2019 02/17/2019 11/18/2018 08/12/2018   Falls in the past year? 0 0 0 0 No  Number falls in past yr: 0 0 0 - -  Injury with Fall? 0 0 0 - -   Functional Status Survey: Is the patient deaf or have difficulty hearing?: No Does the patient have difficulty seeing, even when wearing glasses/contacts?: Yes Does the patient have difficulty concentrating, remembering, or making decisions?: No Does the patient have difficulty walking or climbing stairs?: No Does the patient have difficulty dressing or bathing?: No Does the patient have difficulty doing errands alone such as visiting a doctor's office or shopping?: No   Assessment & Plan  1. Encounter for annual physical exam -Prostate cancer screening and PSA options (with potential risks and benefits of testing vs not testing) were discussed along with recent recs/guidelines. -USPSTF grade A and B recommendations reviewed with patient; age-appropriate recommendations, preventive care, screening tests, etc discussed and encouraged; healthy living encouraged; see AVS for patient education given to patient -Discussed importance of 150 minutes of physical activity weekly, eat two servings of fish weekly, eat one serving of tree nuts ( cashews, pistachios, pecans, almonds.Marland Kitchen) every other day, eat 6 servings of fruit/vegetables daily and drink plenty of water and avoid sweet beverages.

## 2019-09-02 ENCOUNTER — Other Ambulatory Visit: Payer: Self-pay | Admitting: Family Medicine

## 2019-09-02 DIAGNOSIS — E782 Mixed hyperlipidemia: Secondary | ICD-10-CM

## 2019-09-02 NOTE — Telephone Encounter (Signed)
Requested Prescriptions  Pending Prescriptions Disp Refills  . atorvastatin (LIPITOR) 10 MG tablet [Pharmacy Med Name: ATORVASTATIN 10 MG TABLET] 90 tablet 3    Sig: TAKE 1 TABLET BY MOUTH EVERYDAY AT BEDTIME     Cardiovascular:  Antilipid - Statins Passed - 09/02/2019  8:37 AM      Passed - Total Cholesterol in normal range and within 360 days    Cholesterol, Total  Date Value Ref Range Status  02/21/2016 137 100 - 199 mg/dL Final   Cholesterol  Date Value Ref Range Status  05/30/2019 99 <200 mg/dL Final         Passed - LDL in normal range and within 360 days    LDL Cholesterol (Calc)  Date Value Ref Range Status  05/30/2019 43 mg/dL (calc) Final    Comment:    Reference range: <100 . Desirable range <100 mg/dL for primary prevention;   <70 mg/dL for patients with CHD or diabetic patients  with > or = 2 CHD risk factors. Marland Kitchen LDL-C is now calculated using the Martin-Hopkins  calculation, which is a validated novel method providing  better accuracy than the Friedewald equation in the  estimation of LDL-C.  Cresenciano Genre et al. Annamaria Helling. [id number]): 2061-2068  (http://education.[employer].com/faq/FAQ164)          Passed - HDL in normal range and within 360 days    HDL  Date Value Ref Range Status  05/30/2019 41 > OR = 40 mg/dL Final  02/21/2016 48 >39 mg/dL Final         Passed - Triglycerides in normal range and within 360 days    Triglycerides  Date Value Ref Range Status  05/30/2019 72 <150 mg/dL Final         Passed - Patient is not pregnant      Passed - Valid encounter within last 12 months    Recent Outpatient Visits          Yesterday Encounter for annual physical exam   [place], FNP   3 months ago Essential hypertension   [place], NP   4 months ago Erroneous encounter - disregard   [place], NP   6 months ago Essential  hypertension   [place], NP   8 months ago Essential hypertension   [place], Satira Anis, MD      Future Appointments            In 3 months Delsa Grana, PA-C [facility], [place]   In 10 months [place], Hunt Oris, [place]   In 1 year Delsa Grana, PA-C [facility], [place]

## 2019-10-04 ENCOUNTER — Encounter: Payer: Self-pay | Admitting: Family Medicine

## 2019-10-04 DIAGNOSIS — I10 Essential (primary) hypertension: Secondary | ICD-10-CM

## 2019-10-05 ENCOUNTER — Other Ambulatory Visit: Payer: Self-pay | Admitting: Family Medicine

## 2019-10-05 MED ORDER — HYDROCHLOROTHIAZIDE 25 MG PO TABS: 25.0000 mg | ORAL_TABLET | Freq: Every day | ORAL | 0 refills | Status: DC

## 2019-10-05 MED ORDER — LOSARTAN POTASSIUM 100 MG PO TABS: 100.0000 mg | ORAL_TABLET | Freq: Every day | ORAL | 0 refills | Status: DC

## 2019-10-06 ENCOUNTER — Other Ambulatory Visit: Payer: Self-pay

## 2019-10-06 DIAGNOSIS — R21 Rash and other nonspecific skin eruption: Secondary | ICD-10-CM

## 2019-10-06 MED ORDER — KETOCONAZOLE 2 % EX CREA: 1.0000 "application " | TOPICAL_CREAM | Freq: Two times a day (BID) | CUTANEOUS | 3 refills | Status: DC

## 2019-10-26 ENCOUNTER — Encounter: Payer: Self-pay | Admitting: Podiatry

## 2019-10-26 MED ORDER — GABAPENTIN 600 MG PO TABS: 600.0000 mg | ORAL_TABLET | Freq: Three times a day (TID) | ORAL | 3 refills | Status: DC

## 2019-11-07 DIAGNOSIS — E1129 Type 2 diabetes mellitus with other diabetic kidney complication: Secondary | ICD-10-CM | POA: Insufficient documentation

## 2019-11-07 DIAGNOSIS — I10 Essential (primary) hypertension: Secondary | ICD-10-CM | POA: Insufficient documentation

## 2019-11-07 DIAGNOSIS — R809 Proteinuria, unspecified: Secondary | ICD-10-CM | POA: Insufficient documentation

## 2019-11-16 ENCOUNTER — Other Ambulatory Visit: Payer: Self-pay

## 2019-11-16 ENCOUNTER — Ambulatory Visit (INDEPENDENT_AMBULATORY_CARE_PROVIDER_SITE_OTHER): Payer: BC Managed Care – PPO | Admitting: Family Medicine

## 2019-11-16 ENCOUNTER — Encounter: Payer: Self-pay | Admitting: Family Medicine

## 2019-11-16 DIAGNOSIS — R05 Cough: Secondary | ICD-10-CM

## 2019-11-16 DIAGNOSIS — U071 COVID-19: Secondary | ICD-10-CM | POA: Diagnosis not present

## 2019-11-16 DIAGNOSIS — R058 Other specified cough: Secondary | ICD-10-CM

## 2019-11-16 MED ORDER — BENZONATATE 100 MG PO CAPS: 100.0000 mg | ORAL_CAPSULE | Freq: Three times a day (TID) | ORAL | 0 refills | Status: DC | PRN

## 2019-11-16 NOTE — Progress Notes (Signed)
Name: Marc Stevens   MRN: [id number]    DOB: [birth year]   Date:11/16/2019       Progress Note  Subjective  Chief Complaint  Chief Complaint  Patient presents with  . Covid Positive  . Cough    I connected with  [name] on 11/16/19 at 10:40 AM EST by telephone and verified that I am speaking with the correct person using two identifiers.   I discussed the limitations, risks, security and privacy concerns of performing an evaluation and management service by telephone and the availability of in person appointments. Staff also discussed with the patient that there may be a patient responsible charge related to this service. Patient Location: Home Provider Location: Office Additional Individuals present: None  HPI  Pt presents with concerns around his COVID-19 infection.  He went Tuesday 11/14/19 for testing, received results yesterday.  Notes symptoms started 11/08/19.   He endorses some fatigue, 1 episodes of chills, intermittent lightheadedness, and significant cough.  Denies fevers, loss of taste/smell, chest pain, shortness of breath.  Patient Active Problem List   Diagnosis Date Noted  . Lymphedema 06/16/2018  . 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 Prediabetes 08/13/2017  . Mitral regurgitation 01/17/2016  . LVH (left ventricular hypertrophy) 01/01/2016  . Low testosterone 05/14/2015  . 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 Vitamin D deficiency 04/26/2015  . Beta thalassemia minor 04/26/2015  . Morbid obesity ([place])   . ED (erectile dysfunction)   . Hyperlipidemia   . OSA (obstructive sleep apnea)   . HTN (hypertension) 04/18/2012    Social History   Tobacco Use  . Smoking status: Former Smoker    Packs/day: 0.50    Years: 30.00    Pack years: 15.00    Types: Cigarettes    Quit date: 10/19/2004    Years since quitting: 15.0  . Smokeless tobacco: Never Used  Substance Use Topics  . Alcohol use: Yes    Alcohol/week: 0.0  standard drinks    Comment: occasional     Current Outpatient Medications:  .  amLODipine (NORVASC) 2.5 MG tablet, TAKE 1 TABLET BY MOUTH EVERY DAY, Disp: 90 tablet, Rfl: 1 .  aspirin EC 81 MG tablet, Take 1 tablet (81 mg total) by mouth every morning. Resume 4 days post-op, Disp: , Rfl:  .  atorvastatin (LIPITOR) 10 MG tablet, TAKE 1 TABLET BY MOUTH EVERYDAY AT BEDTIME, Disp: 90 tablet, Rfl: 3 .  carvedilol (COREG) 12.5 MG tablet, TAKE 1 TABLET (12.5 MG TOTAL) BY MOUTH 2 (TWO) TIMES DAILY WITH A MEAL., Disp: 180 tablet, Rfl: 1 .  gabapentin (NEURONTIN) 600 MG tablet, Take 1 tablet (600 mg total) by mouth 3 (three) times daily., Disp: 270 tablet, Rfl: 3 .  hydrochlorothiazide (HYDRODIURIL) 25 MG tablet, Take 1 tablet (25 mg total) by mouth daily., Disp: 90 tablet, Rfl: 0 .  ketoconazole (NIZORAL) 2 % cream, Apply 1 application topically 2 (two) times daily., Disp: 45 g, Rfl: 3 .  losartan (COZAAR) 100 MG tablet, Take 1 tablet (100 mg total) by mouth daily., Disp: 90 tablet, Rfl: 0 .  metFORMIN (GLUCOPHAGE-XR) 500 MG 24 hr tablet, TAKE 2 TABLETS BY MOUTH EVERY DAY WITH BREAKFAST, Disp: 180 tablet, Rfl: 1 .  sildenafil (REVATIO) 20 MG tablet, Take 2 to 5 tablets 30 minutes prior to intercourse on an empty stomach, Disp: 10 tablet, Rfl:  .  sildenafil (VIAGRA) 100 MG tablet, Take 1 tablet (100 mg total) by mouth daily as  needed for erectile dysfunction., Disp: 30 tablet, Rfl: 3 .  thiamine (VITAMIN B-1) 100 MG tablet, Take 100 mg by mouth daily., Disp: , Rfl:   No Known Allergies  I personally reviewed active problem list, medication list, allergies with the patient/caregiver today.  ROS  Ten systems reviewed and is negative except as mentioned in HPI  Objective  Virtual encounter, vitals not obtained.  There is no height or weight on file to calculate BMI.  Nursing Note and Vital Signs reviewed.  Physical Exam  Pulmonary/Chest: Effort normal. No respiratory distress. Speaking in  complete sentences Neurological: Pt is alert and oriented to person, place, and time. Speech is normal Psychiatric: Patient has a normal mood and affect. behavior is normal. Judgment and thought content normal.  No results found for this or any previous visit (from the past 72 hour(s)).  Assessment & Plan  1. COVID-19 virus infection - Recommend continued use of CPAP, stay well hydrated and continue to eat regular diet as tolerated, tylenol as needed for fevers/chills/pain. Discussed red flags as below - benzonatate (TESSALON PERLES) 100 MG capsule; Take 1 capsule (100 mg total) by mouth 3 (three) times daily as needed.  Dispense: 20 capsule; Refill: 0  2. Productive cough - benzonatate (TESSALON PERLES) 100 MG capsule; Take 1 capsule (100 mg total) by mouth 3 (three) times daily as needed.  Dispense: 20 capsule; Refill: 0  -Red flags and when to present for emergency care or RTC including fever >101.37F, chest pain, shortness of breath, new/worsening/un-resolving symptoms, reviewed with patient at time of visit. Follow up and care instructions discussed and provided in AVS. - I discussed the assessment and treatment plan with the patient. The patient was provided an opportunity to ask questions and all were answered. The patient agreed with the plan and demonstrated an understanding of the instructions.  - The patient was advised to call back or seek an in-person evaluation if the symptoms worsen or if the condition fails to improve as anticipated.  I provided 14 minutes of non-face-to-face time during this encounter.  Hubbard Hartshorn, FNP

## 2019-11-28 ENCOUNTER — Encounter: Payer: Self-pay | Admitting: Family Medicine

## 2019-12-01 ENCOUNTER — Ambulatory Visit: Payer: BC Managed Care – PPO | Admitting: Family Medicine

## 2019-12-29 ENCOUNTER — Ambulatory Visit: Payer: BC Managed Care – PPO | Admitting: Family Medicine

## 2019-12-29 ENCOUNTER — Encounter: Payer: Self-pay | Admitting: Family Medicine

## 2019-12-29 ENCOUNTER — Other Ambulatory Visit: Payer: Self-pay

## 2019-12-29 VITALS — BP 122/78 | HR 84 | Temp 97.8°F | Resp 14 | Ht 71.0 in | Wt 264.6 lb

## 2019-12-29 DIAGNOSIS — Z5181 Encounter for therapeutic drug level monitoring: Secondary | ICD-10-CM

## 2019-12-29 DIAGNOSIS — I10 Essential (primary) hypertension: Secondary | ICD-10-CM | POA: Diagnosis not present

## 2019-12-29 DIAGNOSIS — L299 Pruritus, unspecified: Secondary | ICD-10-CM

## 2019-12-29 DIAGNOSIS — R202 Paresthesia of skin: Secondary | ICD-10-CM

## 2019-12-29 DIAGNOSIS — D563 Thalassemia minor: Secondary | ICD-10-CM

## 2019-12-29 DIAGNOSIS — R7303 Prediabetes: Secondary | ICD-10-CM | POA: Diagnosis not present

## 2019-12-29 DIAGNOSIS — E782 Mixed hyperlipidemia: Secondary | ICD-10-CM | POA: Diagnosis not present

## 2019-12-29 DIAGNOSIS — E559 Vitamin D deficiency, unspecified: Secondary | ICD-10-CM

## 2019-12-29 DIAGNOSIS — G629 Polyneuropathy, unspecified: Secondary | ICD-10-CM

## 2019-12-29 MED ORDER — HYDROXYZINE HCL 10 MG PO TABS: 10.0000 mg | ORAL_TABLET | Freq: Three times a day (TID) | ORAL | 0 refills | Status: DC | PRN

## 2019-12-29 NOTE — Progress Notes (Signed)
Name: Marc Stevens   MRN: [id number]    DOB: [birth year]   Date:12/29/2019       Progress Note  Chief Complaint  Patient presents with  . Follow-up  . Diabetes  . Hyperlipidemia  . Hypertension     Subjective:   Marc Stevens is a 59 y.o. male, presents to clinic for routine follow up on the conditions listed above.  DM vs prediabetes?  Patient new to me -he has been well controlled and at goal for the last year or so  Diabetes Mellitus Type II: Hx of being well controlled Currently managing with diet and lifestyle and metformin 1000 mg BID Pt notes good med compliance Pt has no SE from meds. No hypoglycemic episodes Denies: Polyuria, polydipsia, polyphagia, vision changes, or neuropathy Recent pertinent labs: Lab Results  Component Value Date   HGBA1C 6.0 (H) 05/30/2019   HGBA1C 6.2 (H) 11/18/2018   HGBA1C 6.0 (H) 05/13/2018  Pt is due DM foot exam and eye exam ACEI/ARB: Yes Statin: Yes  Hypertension:  Currently managed on losartan 100 norvasc 2.5 carvedilol 12.5  Pt reports good med compliance and denies any SE.  No lightheadedness, hypotension, syncope. Blood pressure today is well controlled. BP Readings from Last 3 Encounters:  12/29/19 122/78  09/01/19 122/74  07/10/19 135/79  Pt denies CP, SOB, exertional sx, LE edema, palpitation, Ha's, visual disturbances  Hyperlipidemia: Current Medication Regimen:   lipitor 10 mg daily at bedtime good compliance Last Lipids:  Lab Results  Component Value Date   CHOL 133 12/29/2019   HDL 42 12/29/2019   LDLCALC 70 12/29/2019   TRIG 129 12/29/2019   CHOLHDL 3.2 12/29/2019  - Current Diet:  Tries to eat healthy - Denies: Chest pain, shortness of breath, myalgias. - Documented aortic atherosclerosis? No - Risk factors for atherosclerosis: diabetes mellitus, hypercholesterolemia and hypertension    Has itching all over, starts at his hand moves all over his body sometimes has some tingling and  sensation of crawling or sometimes pain.  It is always occurring when he tries to go to bed at night it is interfering with his sleep  Patient has a history of vitamin D deficiency not on any supplements right now  Patient also has history of low testosterone, beta thalassemia, BPH, and erectile dysfunction    Patient Active Problem List   Diagnosis Date Noted  . Lymphedema 06/16/2018  . 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 Prediabetes 08/13/2017  . Mitral regurgitation 01/17/2016  . LVH (left ventricular hypertrophy) 01/01/2016  . Low testosterone 05/14/2015  . 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 Vitamin D deficiency 04/26/2015  . Beta thalassemia minor 04/26/2015  . Morbid obesity ([place])   . ED (erectile dysfunction)   . Hyperlipidemia   . OSA (obstructive sleep apnea)   . HTN (hypertension) 04/18/2012    Past Surgical History:  Procedure Laterality Date  . COLONOSCOPY WITH PROPOFOL N/A 02/18/2018   Procedure: COLONOSCOPY WITH PROPOFOL;  Surgeon: Jonathon Bellows, MD;  Location: [facility] END[facility]COPY;  Service: Gastroenterology;  Laterality: N/A;  . DOPPLER ECHOCARDIOGRAPHY  2013  . left kene arthroscopy     . LUMBAR LAMINECTOMY/DECOMPRESSION MICRODISCECTOMY N/A 01/30/2015   Procedure: MICRO LUMBAR DECOMPRESSION, BILATERAL FORAMINOTOMIES, MICRODISCECTOMY LUMBAR FOUR TO LUMBAR FIVE;  Surgeon: Susa Day, MD;  Location: [facility] ORS;  Service: Orthopedics;  Laterality: N/A;  . right knee surgery    . ROTATOR CUFF REPAIR  2007   Left  . SHOULDER OPEN ROTATOR CUFF  REPAIR Right 08/08/2015   Procedure: RIGHT SHOULDER MINI OPEN ROTATOR CUFF REPAIR SUBACROMIAL DECOMPRESSION ;  Surgeon: Susa Day, MD;  Location: [facility] ORS;  Service: Orthopedics;  Laterality: Right;    Family History  Problem Relation Age of Onset  . Dementia Mother   . Hypertension Mother   . Cancer Father        colon  . Heart disease Father   . Congestive Heart Failure Father   . Hypertension Sister     . Dementia Maternal Grandmother   . Kidney disease Neg Hx   . Prostate cancer Neg Hx   . COPD Neg Hx   . Diabetes Neg Hx   . Stroke Neg Hx   . Kidney cancer Neg Hx   . Bladder Cancer Neg Hx     Social History   Tobacco Use  . Smoking status: Former Smoker    Packs/day: 0.50    Years: 30.00    Pack years: 15.00    Types: Cigarettes    Quit date: 10/19/2004    Years since quitting: 15.2  . Smokeless tobacco: Never Used  Substance Use Topics  . Alcohol use: Yes    Alcohol/week: 0.0 standard drinks    Comment: occasional  . Drug use: No      Current Outpatient Medications:  .  amLODipine (NORVASC) 2.5 MG tablet, TAKE 1 TABLET BY MOUTH EVERY DAY, Disp: 90 tablet, Rfl: 1 .  aspirin EC 81 MG tablet, Take 1 tablet (81 mg total) by mouth every morning. Resume 4 days post-op, Disp: , Rfl:  .  atorvastatin (LIPITOR) 10 MG tablet, TAKE 1 TABLET BY MOUTH EVERYDAY AT BEDTIME, Disp: 90 tablet, Rfl: 3 .  carvedilol (COREG) 12.5 MG tablet, TAKE 1 TABLET (12.5 MG TOTAL) BY MOUTH 2 (TWO) TIMES DAILY WITH A MEAL., Disp: 180 tablet, Rfl: 1 .  gabapentin (NEURONTIN) 600 MG tablet, Take 1 tablet (600 mg total) by mouth 3 (three) times daily., Disp: 270 tablet, Rfl: 3 .  hydrochlorothiazide (HYDRODIURIL) 25 MG tablet, Take 1 tablet (25 mg total) by mouth daily., Disp: 90 tablet, Rfl: 0 .  ketoconazole (NIZORAL) 2 % cream, Apply 1 application topically 2 (two) times daily., Disp: 45 g, Rfl: 3 .  losartan (COZAAR) 100 MG tablet, Take 1 tablet (100 mg total) by mouth daily., Disp: 90 tablet, Rfl: 0 .  metFORMIN (GLUCOPHAGE-XR) 500 MG 24 hr tablet, TAKE 2 TABLETS BY MOUTH EVERY DAY WITH BREAKFAST, Disp: 180 tablet, Rfl: 1 .  Multiple Vitamin (MULTIVITAMIN) tablet, Take 1 tablet by mouth daily., Disp: , Rfl:  .  sildenafil (VIAGRA) 100 MG tablet, Take 1 tablet (100 mg total) by mouth daily as needed for erectile dysfunction., Disp: 30 tablet, Rfl: 3 .  sildenafil (REVATIO) 20 MG tablet, Take 2 to 5  tablets 30 minutes prior to intercourse on an empty stomach (Patient not taking: Reported on 12/29/2019), Disp: 10 tablet, Rfl:  .  thiamine (VITAMIN B-1) 100 MG tablet, Take 100 mg by mouth daily., Disp: , Rfl:   No Known Allergies  Chart Review Today: I personally reviewed active problem list, medication list, allergies, family history, social history, health maintenance, notes from last encounter, lab results, imaging with the patient/caregiver today.  Review of Systems  10 Systems reviewed and are negative for acute change except as noted in the HPI.  Objective:    Vitals:   12/29/19 1118  BP: 122/78  Pulse: 84  Resp: 14  Temp: 97.8 F (36.6 C)  SpO2:  98%  Weight: 264 lb 9.6 oz (120 kg)  Height: 5\' 11"  (1.803 m)    Body mass index is 36.9 kg/m.  Physical Exam Vitals and nursing note reviewed.  Constitutional:      General: He is not in acute distress.    Appearance: Normal appearance. He is well-developed. He is obese. He is not ill-appearing, toxic-appearing or diaphoretic.     Interventions: Face mask in place.  HENT:     Head: Normocephalic and atraumatic.     Jaw: No trismus.     Right Ear: External ear normal.     Left Ear: External ear normal.  Eyes:     General: Lids are normal. No scleral icterus.    Conjunctiva/sclera: Conjunctivae normal.     Pupils: Pupils are equal, round, and reactive to light.  Neck:     Trachea: Trachea and phonation normal. No tracheal deviation.  Cardiovascular:     Rate and Rhythm: Normal rate and regular rhythm.     Pulses: Normal pulses.          Radial pulses are 2+ on the right side and 2+ on the left side.       Posterior tibial pulses are 2+ on the right side and 2+ on the left side.     Heart sounds: Normal heart sounds. No murmur. No friction rub. No gallop.   Pulmonary:     Effort: Pulmonary effort is normal. No respiratory distress.     Breath sounds: Normal breath sounds. No stridor. No wheezing, rhonchi or rales.    Abdominal:     General: Bowel sounds are normal. There is no distension.     Palpations: Abdomen is soft.     Tenderness: There is no abdominal tenderness. There is no guarding or rebound.  Musculoskeletal:        General: Normal range of motion.     Cervical back: Normal range of motion and neck supple.     Right lower leg: No edema.     Left lower leg: No edema.  Skin:    General: Skin is warm and dry.     Capillary Refill: Capillary refill takes less than 2 seconds.     Coloration: Skin is not jaundiced.     Findings: No rash.     Nails: There is no clubbing.     Comments: No observed skin abnormalities no rash  Neurological:     Mental Status: He is alert.     Cranial Nerves: No dysarthria or facial asymmetry.     Sensory: No sensory deficit.     Motor: No weakness, tremor or abnormal muscle tone.     Coordination: Coordination normal.     Gait: Gait normal.  Psychiatric:        Mood and Affect: Mood normal.        Speech: Speech normal.        Behavior: Behavior normal. Behavior is cooperative.        PHQ2/9: Depression screen [facility] 2/9 12/29/2019 11/16/2019 09/01/2019 05/30/2019 02/17/2019  Decreased Interest 0 0 0 0 0  Down, Depressed, Hopeless 0 0 0 0 0  PHQ - 2 Score 0 0 0 0 0  Altered sleeping 0 0 0 0 0  Tired, decreased energy 0 0 0 0 0  Change in appetite 0 0 0 0 1  Feeling bad or failure about yourself  0 0 0 0 2  Trouble concentrating 0 0 0 0 0  Moving slowly  or fidgety/restless 0 0 0 0 0  Suicidal thoughts 0 0 0 0 0  PHQ-9 Score 0 0 0 0 3  Difficult doing work/chores Not difficult at all Not difficult at all - Not difficult at all Not difficult at all  Some recent data might be hidden    phq 9 is neg reviewed today  Fall Risk: Fall Risk  12/29/2019 11/16/2019 09/01/2019 05/30/2019 02/17/2019  Falls in the past year? 0 0 0 0 0  Number falls in past yr: 0 0 0 0 0  Injury with Fall? 0 0 0 0 0  Follow up - Falls evaluation completed - - -    Functional  Status Survey: Is the patient deaf or have difficulty hearing?: No Does the patient have difficulty seeing, even when wearing glasses/contacts?: No Does the patient have difficulty concentrating, remembering, or making decisions?: No Does the patient have difficulty walking or climbing stairs?: No Does the patient have difficulty dressing or bathing?: No Does the patient have difficulty doing errands alone such as visiting a doctor's office or shopping?: No   Assessment & Plan:     ICD-10-CM   1. Essential hypertension  I10    Well-controlled check labs, continue current management  2. Mixed hyperlipidemia  E78.2    Tolerating statin, no myalgias concerns or side effects check labs  3. Prediabetes  R73.03    Prediabetes versus diabetes patient is on 1000 mg Metformin twice a day last A1c was 6.0  4. Vitamin D deficiency  E55.9 VITAMIN D 25 Hydroxy (Vit-D Deficiency, Fractures)   Recheck labs, not on any current supplement  5. Neuropathy  G62.9    Patient having some paresthesias itching crawling sensation does occur at night we will check for deficiencies anemia/iron deficiency etc.  6. Paresthesia of skin  R20.2 hydrOXYzine (ATARAX/VISTARIL) 10 MG tablet  7. Pruritic condition  L29.9 CBC with Differential/Platelet    COMPLETE METABOLIC PANEL WITH GFR    Lipid panel    Hemoglobin A1c    TSH    VITAMIN D 25 Hydroxy (Vit-D Deficiency, Fractures)    hydrOXYzine (ATARAX/VISTARIL) 10 MG tablet  8. Beta thalassemia minor  D56.3    Recheck CBC  9. Medication monitoring encounter  Z51.81 CBC with Differential/Platelet    COMPLETE METABOLIC PANEL WITH GFR    Lipid panel    Hemoglobin A1c    TSH  Patient briefly mentioned his skin sensation itching without a rash sensation of things moving crawling on him did discuss things that we could check a rule out as possible causes also discussed that some of his labs may be poorly covered with his insurance but symptoms are severe enough to keep  up with night so he decided to proceed with the work-up will rule out hypothyroid, electrolyte abnormality, severely uncontrolled diabetes although I doubt this he has no other concerning symptoms, does have a history of thalassemia we will recheck CBC could add on any iron panels or B12 based on the CBC and cell indices For now encouraged him to use hydroxyzine 10 mg as needed   Return in about 6 months (around 06/30/2020).   Delsa Grana, PA-C 12/29/19 11:33 AM

## 2019-12-30 LAB — CBC WITH DIFFERENTIAL/PLATELET
Absolute Monocytes: 455 cells/uL (ref 200–950)
Basophils Absolute: 20 cells/uL (ref 0–200)
Basophils Relative: 0.6 %
Eosinophils Absolute: 69 cells/uL (ref 15–500)
Eosinophils Relative: 2.1 %
HCT: 44.3 % (ref 38.5–50.0)
Hemoglobin: 13.7 g/dL (ref 13.2–17.1)
Lymphs Abs: 1416 cells/uL (ref 850–3900)
MCH: 19.5 pg — ABNORMAL LOW (ref 27.0–33.0)
MCHC: 30.9 g/dL — ABNORMAL LOW (ref 32.0–36.0)
MCV: 63.1 fL — ABNORMAL LOW (ref 80.0–100.0)
MPV: 9 fL (ref 7.5–12.5)
Monocytes Relative: 13.8 %
Neutro Abs: 1340 cells/uL — ABNORMAL LOW (ref 1500–7800)
Neutrophils Relative %: 40.6 %
Platelets: 350 10*3/uL (ref 140–400)
RBC: 7.02 10*6/uL — ABNORMAL HIGH (ref 4.20–5.80)
RDW: 17.5 % — ABNORMAL HIGH (ref 11.0–15.0)
Total Lymphocyte: 42.9 %
WBC: 3.3 10*3/uL — ABNORMAL LOW (ref 3.8–10.8)

## 2019-12-30 LAB — LIPID PANEL
Cholesterol: 133 mg/dL (ref <200)
HDL: 42 mg/dL (ref >=40)
LDL Cholesterol (Calc): 70 mg/dL (calc)
Non-HDL Cholesterol (Calc): 91 mg/dL (calc) (ref <130)
Total CHOL/HDL Ratio: 3.2 (calc) (ref <5.0)
Triglycerides: 129 mg/dL (ref <150)

## 2019-12-30 LAB — COMPLETE METABOLIC PANEL WITH GFR
AG Ratio: 1.5 (calc) (ref 1.0–2.5)
ALT: 23 U/L (ref 9–46)
AST: 22 U/L (ref 10–35)
Albumin: 4 g/dL (ref 3.6–5.1)
Alkaline phosphatase (APISO): 86 U/L (ref 35–144)
BUN: 14 mg/dL (ref 7–25)
CO2: 31 mmol/L (ref 20–32)
Calcium: 9.6 mg/dL (ref 8.6–10.3)
Chloride: 105 mmol/L (ref 98–110)
Creat: 1.04 mg/dL (ref 0.70–1.33)
GFR, Est African American: 92 mL/min/{1.73_m2} (ref >=60)
GFR, Est Non African American: 79 mL/min/{1.73_m2} (ref >=60)
Globulin: 2.6 g/dL (calc) (ref 1.9–3.7)
Glucose, Bld: 95 mg/dL (ref 65–99)
Potassium: 3.6 mmol/L (ref 3.5–5.3)
Sodium: 144 mmol/L (ref 135–146)
Total Bilirubin: 0.7 mg/dL (ref 0.2–1.2)
Total Protein: 6.6 g/dL (ref 6.1–8.1)

## 2019-12-30 LAB — HEMOGLOBIN A1C
Hgb A1c MFr Bld: 6.4 % of total Hgb — ABNORMAL HIGH (ref <5.7)
Mean Plasma Glucose: 137 (calc)
eAG (mmol/L): 7.6 (calc)

## 2019-12-30 LAB — VITAMIN D 25 HYDROXY (VIT D DEFICIENCY, FRACTURES): Vit D, 25-Hydroxy: 22 ng/mL — ABNORMAL LOW (ref 30–100)

## 2019-12-30 LAB — TSH: TSH: 0.55 mIU/L (ref 0.40–4.50)

## 2020-01-02 ENCOUNTER — Telehealth: Payer: Self-pay

## 2020-01-02 ENCOUNTER — Other Ambulatory Visit: Payer: Self-pay | Admitting: Family Medicine

## 2020-01-02 DIAGNOSIS — I10 Essential (primary) hypertension: Secondary | ICD-10-CM

## 2020-01-02 DIAGNOSIS — D709 Neutropenia, unspecified: Secondary | ICD-10-CM

## 2020-01-02 NOTE — Telephone Encounter (Signed)
Requested Prescriptions  Pending Prescriptions Disp Refills  . losartan (COZAAR) 100 MG tablet [Pharmacy Med Name: LOSARTAN POTASSIUM 100 MG TAB] 90 tablet 1    Sig: TAKE 1 TABLET BY MOUTH EVERY DAY     Cardiovascular:  Angiotensin Receptor Blockers Passed - 01/02/2020  1:40 AM      Passed - Cr in normal range and within 180 days    Creat  Date Value Ref Range Status  12/29/2019 1.04 0.70 - 1.33 mg/dL Final    Comment:    For patients >58 years of age, the reference limit for Creatinine is approximately 13% higher for people identified as African-American. .          Passed - K in normal range and within 180 days    Potassium  Date Value Ref Range Status  12/29/2019 3.6 3.5 - 5.3 mmol/L Final  04/04/2014 3.7 3.5 - 5.1 mmol/L Final         Passed - Patient is not pregnant      Passed - Last BP in normal range    BP Readings from Last 1 Encounters:  12/29/19 122/78         Passed - Valid encounter within last 6 months    Recent Outpatient Visits          4 days ago Encounter for medication monitoring   [facility] Delsa Grana, PA-C   1 month ago COVID-19 virus infection   [place], [place]   4 months ago Encounter for annual physical exam   [place], FNP   7 months ago Essential hypertension   [place], NP   8 months ago Erroneous encounter - disregard   [place], NP      Future Appointments            In 6 months Delsa Grana, PA-C [facility], [place]   In 6 months [place], [place], [place]   In 8 months Delsa Grana, PA-C [facility], [facility]

## 2020-01-02 NOTE — Telephone Encounter (Signed)
-----   Message from Delsa Grana, [place] sent at 01/01/2020  5:06 PM EDT ----- Patient's blood work is consistent with his history of beta thalassemia without anemia He does have a decrease in his white blood cells and decreased neutrophils -we will need to recheck his CBC in a couple weeks if his white blood cells continue to be low would want him to follow-up with hematology for further evaluation  His A1c is increasing it is now 6.4, continue to work on diet exercise and continues to take Metformin Kidney function and liver function are good and normal, thyroid labs were normal  Vitamin D is still low -he should take 2000 international units every day   Please order CBC with differential for diagnosis of neutropenia - pt can come get done in the next couple weeks thanks

## 2020-01-06 ENCOUNTER — Other Ambulatory Visit: Payer: Self-pay | Admitting: Family Medicine

## 2020-01-06 ENCOUNTER — Encounter: Payer: Self-pay | Admitting: Family Medicine

## 2020-01-06 NOTE — Telephone Encounter (Signed)
Requested Prescriptions  Pending Prescriptions Disp Refills  . hydrochlorothiazide (HYDRODIURIL) 25 MG tablet [Pharmacy Med Name: HYDROCHLOROTHIAZIDE 25 MG TAB] 90 tablet 1    Sig: TAKE 1 TABLET BY MOUTH EVERY DAY     Cardiovascular: Diuretics - Thiazide Passed - 01/06/2020 12:22 PM      Passed - Ca in normal range and within 360 days    Calcium  Date Value Ref Range Status  12/29/2019 9.6 8.6 - 10.3 mg/dL Final   Calcium, Total  Date Value Ref Range Status  04/04/2014 8.6 8.5 - 10.1 mg/dL Final   Calcium, Ion  Date Value Ref Range Status  12/28/2015 1.12 1.12 - 1.23 mmol/L Final         Passed - Cr in normal range and within 360 days    Creat  Date Value Ref Range Status  12/29/2019 1.04 0.70 - 1.33 mg/dL Final    Comment:    For patients >31 years of age, the reference limit for Creatinine is approximately 13% higher for people identified as African-American. .          Passed - K in normal range and within 360 days    Potassium  Date Value Ref Range Status  12/29/2019 3.6 3.5 - 5.3 mmol/L Final  04/04/2014 3.7 3.5 - 5.1 mmol/L Final         Passed - Na in normal range and within 360 days    Sodium  Date Value Ref Range Status  12/29/2019 144 135 - 146 mmol/L Final  02/21/2016 141 134 - 144 mmol/L Final  04/04/2014 144 136 - 145 mmol/L Final         Passed - Last BP in normal range    BP Readings from Last 1 Encounters:  12/29/19 122/78         Passed - Valid encounter within last 6 months    Recent Outpatient Visits          1 week ago Essential hypertension   [facility] Delsa Grana, PA-C   1 month ago COVID-19 virus infection   [place], FNP   4 months ago Encounter for annual physical exam   [place], FNP   7 months ago Essential hypertension   [place], NP   8 months ago Erroneous encounter - disregard   [place], NP      Future Appointments            In 6 months Delsa Grana, PA-C [facility], [place]   In 6 months [place], [place], [place]   In 8 months Delsa Grana, PA-C [facility], [facility]

## 2020-01-11 ENCOUNTER — Encounter: Payer: Self-pay | Admitting: Oncology

## 2020-01-11 NOTE — Progress Notes (Signed)
Patient contacted for New patient visit. Chart reviewed and updated. Pt aware of referral but had to clarify that it was a hematology related not oncology. Pt voiced understanding. Directions to cancer center given. No other concerns voiced.

## 2020-01-12 ENCOUNTER — Other Ambulatory Visit: Payer: Self-pay | Admitting: Family Medicine

## 2020-01-12 ENCOUNTER — Inpatient Hospital Stay: Payer: BC Managed Care – PPO | Attending: Oncology | Admitting: Oncology

## 2020-01-12 ENCOUNTER — Inpatient Hospital Stay: Payer: BC Managed Care – PPO

## 2020-01-12 VITALS — BP 129/79 | HR 77 | Temp 97.3°F | Resp 18 | Wt 265.8 lb

## 2020-01-12 DIAGNOSIS — D709 Neutropenia, unspecified: Secondary | ICD-10-CM | POA: Insufficient documentation

## 2020-01-12 DIAGNOSIS — Z800 Family history of malignant neoplasm of digestive organs: Secondary | ICD-10-CM | POA: Diagnosis not present

## 2020-01-12 DIAGNOSIS — R634 Abnormal weight loss: Secondary | ICD-10-CM | POA: Diagnosis not present

## 2020-01-12 DIAGNOSIS — R718 Other abnormality of red blood cells: Secondary | ICD-10-CM | POA: Insufficient documentation

## 2020-01-12 DIAGNOSIS — Z87891 Personal history of nicotine dependence: Secondary | ICD-10-CM | POA: Diagnosis not present

## 2020-01-12 LAB — TECHNOLOGIST SMEAR REVIEW: Plt Morphology: ADEQUATE

## 2020-01-12 LAB — CBC WITH DIFFERENTIAL/PLATELET
Abs Immature Granulocytes: 0.01 10*3/uL (ref 0.00–0.07)
Basophils Absolute: 0 10*3/uL (ref 0.0–0.1)
Basophils Relative: 0 %
Eosinophils Absolute: 0.2 10*3/uL (ref 0.0–0.5)
Eosinophils Relative: 3 %
HCT: 44 % (ref 39.0–52.0)
Hemoglobin: 13.5 g/dL (ref 13.0–17.0)
Immature Granulocytes: 0 %
Lymphocytes Relative: 29 %
Lymphs Abs: 1.3 10*3/uL (ref 0.7–4.0)
MCH: 19.5 pg — ABNORMAL LOW (ref 26.0–34.0)
MCHC: 30.7 g/dL (ref 30.0–36.0)
MCV: 63.5 fL — ABNORMAL LOW (ref 80.0–100.0)
Monocytes Absolute: 0.6 10*3/uL (ref 0.1–1.0)
Monocytes Relative: 14 %
Neutro Abs: 2.4 10*3/uL (ref 1.7–7.7)
Neutrophils Relative %: 54 %
Platelets: 256 10*3/uL (ref 150–400)
RBC: 6.93 MIL/uL — ABNORMAL HIGH (ref 4.22–5.81)
RDW: 17.9 % — ABNORMAL HIGH (ref 11.5–15.5)
WBC: 4.5 10*3/uL (ref 4.0–10.5)
nRBC: 0 % (ref 0.0–0.2)

## 2020-01-12 LAB — FOLATE: Folate: 25 ng/mL (ref >5.9)

## 2020-01-12 LAB — HEPATITIS PANEL, ACUTE
HCV Ab: NONREACTIVE
Hep A IgM: NONREACTIVE
Hep B C IgM: NONREACTIVE
Hepatitis B Surface Ag: NONREACTIVE

## 2020-01-12 LAB — HIV ANTIBODY (ROUTINE TESTING W REFLEX): HIV Screen 4th Generation wRfx: NONREACTIVE

## 2020-01-12 LAB — VITAMIN B12: Vitamin B-12: 527 pg/mL (ref 180–914)

## 2020-01-12 NOTE — Progress Notes (Signed)
Hematology/Oncology Consult note [facility] [phone] Fax:([phone]   Patient Care Team: Delsa Grana, PA-C as PCP - General (Family Medicine) Wellington Hampshire, MD as Consulting Physician (Cardiology) Lavonia Dana, MD as Consulting Physician (Internal Medicine)  REFERRING PROVIDER: Delsa Grana, PA-C  CHIEF COMPLAINTS/REASON FOR VISIT:  Evaluation of leukopenia  HISTORY OF PRESENTING ILLNESS:  Marc Stevens is a 59 y.o. male who was seen in consultation at the request of Delsa Grana, PA-C for evaluation of leukopenia. Reviewed patient's recent labs.  12/29/2019 patient has low total WBC count was 3.3, predominantly low neutrophil with ANC 1.3. Patient denies any recent infection, vaccination. Previous lab records reviewed. Leukopenia duration is acute onset,   Associated symptoms:  denies fatigue, weight loss, fever, chills, frequent infection.  History hepatitis or HIV infection: Denies History of chronic liver disease: Denies History of blood transfusion: Denies Alcohol consumption: Denies Diet Vegetarian or Vegan: Denies Herbal medication: Denies  Patient drinks core water- alkaline water.  Denies any other over-the-counter supplementation. Patient reports unintentional weight loss of about 10 pounds recently.  His appetite is good. He always sweats a lot. Occasional alcohol use  Review of Systems  Constitutional: Positive for unexpected weight change. Negative for appetite change, chills, fatigue and fever.  HENT:   Negative for hearing loss and voice change.   Eyes: Negative for eye problems and icterus.  Respiratory: Negative for chest tightness, cough and shortness of breath.   Cardiovascular: Negative for chest pain and leg swelling.  Gastrointestinal: Negative for abdominal distention and abdominal pain.  Endocrine: Negative for hot flashes.  Genitourinary: Negative for difficulty urinating, dysuria and  frequency.   Musculoskeletal: Negative for arthralgias.  Skin: Negative for itching and rash.  Neurological: Negative for light-headedness and numbness.  Hematological: Negative for adenopathy. Does not bruise/bleed easily.  Psychiatric/Behavioral: Negative for confusion.    MEDICAL HISTORY:  Past Medical History:  Diagnosis Date  . BPH (benign prostatic hypertrophy)   . Complete rotator cuff tear 08/08/2015  . Diabetes mellitus without complication ([place])   . ED (erectile dysfunction)   . HTN (hypertension) 04/18/2012  . Hyperlipidemia   . Low testosterone   . Morbidly obese ([place])   . OSA (obstructive sleep apnea)    cpap - does not know settings   . Rotator cuff tear    RT  . Torn Achilles tendon 07/11/2015  . Torn Achilles tendon 07/11/2015    SURGICAL HISTORY: Past Surgical History:  Procedure Laterality Date  . COLONOSCOPY WITH PROPOFOL N/A 02/18/2018   Procedure: COLONOSCOPY WITH PROPOFOL;  Surgeon: Jonathon Bellows, MD;  Location: [facility] ENDOSCOPY;  Service: Gastroenterology;  Laterality: N/A;  . DOPPLER ECHOCARDIOGRAPHY  2013  . left kene arthroscopy     . LUMBAR LAMINECTOMY/DECOMPRESSION MICRODISCECTOMY N/A 01/30/2015   Procedure: MICRO LUMBAR DECOMPRESSION, BILATERAL FORAMINOTOMIES, MICRODISCECTOMY LUMBAR FOUR TO LUMBAR FIVE;  Surgeon: Susa Day, MD;  Location: [facility] ORS;  Service: Orthopedics;  Laterality: N/A;  . right knee surgery    . ROTATOR CUFF REPAIR  2007   Left  . SHOULDER OPEN ROTATOR CUFF REPAIR Right 08/08/2015   Procedure: RIGHT SHOULDER MINI OPEN ROTATOR CUFF REPAIR SUBACROMIAL DECOMPRESSION ;  Surgeon: Susa Day, MD;  Location: [facility] ORS;  Service: Orthopedics;  Laterality: Right;    SOCIAL HISTORY: Social History   Socioeconomic History  . Marital status: Single    Spouse name: Not on file  . Number of children: Not on file  . Years of education: Not on file  .  Highest education level: Not on file  Occupational History    Employer: [employer]    Employer:  [employer]  Tobacco Use  . Smoking status: Former Smoker    Packs/day: 0.50    Years: 30.00    Pack years: 15.00    Types: Cigarettes    Quit date: 10/19/2004    Years since quitting: 15.2  . Smokeless tobacco: Never Used  Substance and Sexual Activity  . Alcohol use: Yes    Alcohol/week: 0.0 standard drinks    Comment: occasional  . Drug use: No  . Sexual activity: Yes    Birth control/protection: None  Other Topics Concern  . Not on file  Social History Narrative  . Not on file   Social Determinants of Health   Financial Resource Strain:   . Difficulty of Paying Living Expenses:   Food Insecurity:   . Worried About Charity fundraiser in the Last Year:   . Arboriculturist in the Last Year:   Transportation Needs:   . Film/video editor (Medical):   Marland Kitchen Lack of Transportation (Non-Medical):   Physical Activity:   . Days of Exercise per Week:   . Minutes of Exercise per Session:   Stress:   . Feeling of Stress :   Social Connections:   . Frequency of Communication with Friends and Family:   . Frequency of Social Gatherings with Friends and Family:   . Attends Religious Services:   . Active Member of Clubs or Organizations:   . Attends Archivist Meetings:   Marland Kitchen Marital Status:   Intimate Partner Violence:   . Fear of Current or Ex-Partner:   . Emotionally Abused:   Marland Kitchen Physically Abused:   . Sexually Abused:     FAMILY HISTORY: Family History  Problem Relation Age of Onset  . Dementia Mother   . Hypertension Mother   . Cancer Father        colon  . Heart disease Father   . Congestive Heart Failure Father   . Hypertension Sister   . Dementia Maternal Grandmother   . Kidney disease Neg Hx   . Prostate cancer Neg Hx   . COPD Neg Hx   . Diabetes Neg Hx   . Stroke Neg Hx   . Kidney cancer Neg Hx   . Bladder Cancer Neg Hx     ALLERGIES:  has No Known Allergies.  MEDICATIONS:  Current Outpatient Medications  Medication Sig Dispense Refill  .  amLODipine (NORVASC) 2.5 MG tablet TAKE 1 TABLET BY MOUTH EVERY DAY 90 tablet 1  . aspirin EC 81 MG tablet Take 1 tablet (81 mg total) by mouth every morning. Resume 4 days post-op    . atorvastatin (LIPITOR) 10 MG tablet TAKE 1 TABLET BY MOUTH EVERYDAY AT BEDTIME 90 tablet 3  . carvedilol (COREG) 12.5 MG tablet TAKE 1 TABLET (12.5 MG TOTAL) BY MOUTH 2 (TWO) TIMES DAILY WITH A MEAL. 180 tablet 1  . gabapentin (NEURONTIN) 600 MG tablet Take 1 tablet (600 mg total) by mouth 3 (three) times daily. 270 tablet 3  . hydrochlorothiazide (HYDRODIURIL) 25 MG tablet TAKE 1 TABLET BY MOUTH EVERY DAY 90 tablet 1  . hydrOXYzine (ATARAX/VISTARIL) 10 MG tablet Take 1 tablet (10 mg total) by mouth 3 (three) times daily as needed. 30 tablet 0  . ketoconazole (NIZORAL) 2 % cream Apply 1 application topically 2 (two) times daily. 45 g 3  . losartan (COZAAR) 100 MG tablet  TAKE 1 TABLET BY MOUTH EVERY DAY 90 tablet 1  . metFORMIN (GLUCOPHAGE-XR) 500 MG 24 hr tablet TAKE 2 TABLETS BY MOUTH EVERY DAY WITH BREAKFAST 180 tablet 1  . Multiple Vitamin (MULTIVITAMIN) tablet Take 1 tablet by mouth daily.    . sildenafil (VIAGRA) 100 MG tablet Take 1 tablet (100 mg total) by mouth daily as needed for erectile dysfunction. 30 tablet 3  . VITAMIN D, ERGOCALCIFEROL, PO Take 5,000 Units by mouth daily.     No current facility-administered medications for this visit.     PHYSICAL EXAMINATION: ECOG PERFORMANCE STATUS: 0 - Asymptomatic Vitals:   01/12/20 0941  BP: 129/79  Pulse: 77  Resp: 18  Temp: (!) 97.3 F (36.3 C)   Filed Weights   01/12/20 0941  Weight: 265 lb 12.8 oz (120.6 kg)    Physical Exam Constitutional:      General: He is not in acute distress. HENT:     Head: Normocephalic and atraumatic.  Eyes:     General: No scleral icterus. Cardiovascular:     Rate and Rhythm: Normal rate and regular rhythm.     Heart sounds: Normal heart sounds.  Pulmonary:     Effort: Pulmonary effort is normal. No  respiratory distress.     Breath sounds: No wheezing.  Abdominal:     General: Bowel sounds are normal. There is no distension.     Palpations: Abdomen is soft.  Musculoskeletal:        General: No deformity. Normal range of motion.     Cervical back: Normal range of motion and neck supple.  Skin:    General: Skin is warm and dry.     Findings: No erythema or rash.  Neurological:     Mental Status: He is alert and oriented to person, place, and time. Mental status is at baseline.     Cranial Nerves: No cranial nerve deficit.     Coordination: Coordination normal.  Psychiatric:        Mood and Affect: Mood normal.     RADIOGRAPHIC STUDIES: I have personally reviewed the radiological images as listed and agreed with the findings in the report.  CMP Latest Ref Rng & Units 12/29/2019  Glucose 65 - 99 mg/dL 95  BUN 7 - 25 mg/dL 14  Creatinine 0.70 - 1.33 mg/dL 1.04  Sodium 135 - 146 mmol/L 144  Potassium 3.5 - 5.3 mmol/L 3.6  Chloride 98 - 110 mmol/L 105  CO2 20 - 32 mmol/L 31  Calcium 8.6 - 10.3 mg/dL 9.6  Total Protein 6.1 - 8.1 g/dL 6.6  Total Bilirubin 0.2 - 1.2 mg/dL 0.7  Alkaline Phos 40 - 115 U/L -  AST 10 - 35 U/L 22  ALT 9 - 46 U/L 23   CBC Latest Ref Rng & Units 01/12/2020  WBC 4.0 - 10.5 K/uL 4.5  Hemoglobin 13.0 - 17.0 g/dL 13.5  Hematocrit 39.0 - 52.0 % 44.0  Platelets 150 - 400 K/uL 256    LABORATORY DATA:  I have reviewed the data as listed Lab Results  Component Value Date   WBC 4.5 01/12/2020   HGB 13.5 01/12/2020   HCT 44.0 01/12/2020   MCV 63.5 (L) 01/12/2020   PLT 256 01/12/2020   Recent Labs    05/30/19 0846 12/29/19 1147  NA 143 144  K 3.6 3.6  CL 105 105  CO2 29 31  GLUCOSE 106* 95  BUN 19 14  CREATININE 1.24 1.04  CALCIUM 9.3 9.6  GFRNONAA 64 79  GFRAA 74 92  PROT 6.3 6.6  AST 26 22  ALT 31 23  BILITOT 0.5 0.7   Iron/TIBC/Ferritin/ %Sat No results found for: IRON, TIBC, FERRITIN, IRONPCTSAT   RADIOGRAPHIC STUDIES: I have  personally reviewed the radiological images as listed and agreed with the findings in the report. No results found.    ASSESSMENT & PLAN:  1. Neutropenia, unspecified type ([place])   2. RBC microcytosis    I discussed with patient that the differential diagnosis of the leukopenia is broad,  including infection, inflammation, nutrition deficiency, malignant etiology including underlying bone morrow disorders.  He has very mild leukopenia, predominantly neutropenia. I recommend to repeat CBC to see if neutropenia persists or not. 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  I will check hemoglobin fraction # Patient follow-up with me in approximately 2 weeks to review the above results.   Orders Placed This Encounter  Procedures  . Folate    Standing Status:   Future    Number of Occurrences:   1    Standing Expiration Date:   07/14/2021  . Vitamin B12    Standing Status:   Future    Number of Occurrences:   1    Standing Expiration Date:   07/14/2021  . Technologist smear review    Standing Status:   Future    Number of Occurrences:   1    Standing Expiration Date:   07/14/2021  . CBC with Differential/Platelet    Standing Status:   Future    Number of Occurrences:   1    Standing Expiration Date:   07/14/2021  . Flow cytometry panel-leukemia/lymphoma work-up    Standing Status:   Future    Number of Occurrences:   1    Standing Expiration Date:   07/14/2021  . ANA, IFA (with reflex)    Standing Status:   Future    Number of Occurrences:   1    Standing Expiration Date:   07/14/2021  . Hepatitis panel, acute    Standing Status:   Future    Number of Occurrences:   1    Standing Expiration Date:   07/14/2021  . HIV Antibody (routine testing w rflx)    Standing Status:   Future    Number of Occurrences:   1     Standing Expiration Date:   07/14/2021  . Hgb Fractionation [place]    Standing Status:   Future    Number of Occurrences:   1    Standing Expiration Date:   01/11/2021    All questions were answered. The patient knows to call the clinic with any problems questions or concerns.  Cc Delsa Grana, PA-C  Return of visit: 2 weeks Thank you for this kind referral and the opportunity to participate in the care of this patient. A copy of today's note is routed to referring provider      Earlie Server, MD, PhD Hematology Oncology [facility] at [facility] Pager- [phone] 01/12/2020

## 2020-01-15 LAB — ANTINUCLEAR ANTIBODIES, IFA: ANA Ab, IFA: NEGATIVE

## 2020-01-16 LAB — COMP PANEL: LEUKEMIA/LYMPHOMA

## 2020-01-17 LAB — HGB FRACTIONATION CASCADE
Hgb A2: 6.2 % — ABNORMAL HIGH (ref 1.8–3.2)
Hgb A: 93.8 % — ABNORMAL LOW (ref 96.4–98.8)
Hgb F: 0 % (ref 0.0–2.0)
Hgb S: 0 %

## 2020-01-26 ENCOUNTER — Ambulatory Visit: Payer: BC Managed Care – PPO

## 2020-01-26 ENCOUNTER — Inpatient Hospital Stay: Payer: BC Managed Care – PPO | Attending: Oncology | Admitting: Oncology

## 2020-01-26 ENCOUNTER — Encounter: Payer: Self-pay | Admitting: Oncology

## 2020-01-26 ENCOUNTER — Other Ambulatory Visit: Payer: Self-pay

## 2020-01-26 ENCOUNTER — Inpatient Hospital Stay: Payer: BC Managed Care – PPO

## 2020-01-26 VITALS — BP 141/84 | HR 64 | Temp 96.5°F | Resp 18 | Wt 268.8 lb

## 2020-01-26 DIAGNOSIS — Z87891 Personal history of nicotine dependence: Secondary | ICD-10-CM | POA: Insufficient documentation

## 2020-01-26 DIAGNOSIS — D563 Thalassemia minor: Secondary | ICD-10-CM | POA: Insufficient documentation

## 2020-01-26 DIAGNOSIS — D709 Neutropenia, unspecified: Secondary | ICD-10-CM | POA: Diagnosis present

## 2020-01-26 DIAGNOSIS — D561 Beta thalassemia: Secondary | ICD-10-CM | POA: Diagnosis not present

## 2020-01-26 LAB — CBC WITH DIFFERENTIAL/PLATELET
Abs Immature Granulocytes: 0.01 10*3/uL (ref 0.00–0.07)
Basophils Absolute: 0 10*3/uL (ref 0.0–0.1)
Basophils Relative: 1 %
Eosinophils Absolute: 0.2 10*3/uL (ref 0.0–0.5)
Eosinophils Relative: 4 %
HCT: 42 % (ref 39.0–52.0)
Hemoglobin: 12.7 g/dL — ABNORMAL LOW (ref 13.0–17.0)
Immature Granulocytes: 0 %
Lymphocytes Relative: 35 %
Lymphs Abs: 1.4 10*3/uL (ref 0.7–4.0)
MCH: 19.4 pg — ABNORMAL LOW (ref 26.0–34.0)
MCHC: 30.2 g/dL (ref 30.0–36.0)
MCV: 64.1 fL — ABNORMAL LOW (ref 80.0–100.0)
Monocytes Absolute: 0.7 10*3/uL (ref 0.1–1.0)
Monocytes Relative: 17 %
Neutro Abs: 1.8 10*3/uL (ref 1.7–7.7)
Neutrophils Relative %: 43 %
Platelets: 292 10*3/uL (ref 150–400)
RBC: 6.55 MIL/uL — ABNORMAL HIGH (ref 4.22–5.81)
RDW: 18.3 % — ABNORMAL HIGH (ref 11.5–15.5)
WBC: 4.1 10*3/uL (ref 4.0–10.5)
nRBC: 0 % (ref 0.0–0.2)

## 2020-01-26 LAB — PATHOLOGIST SMEAR REVIEW

## 2020-01-26 MED ORDER — SILDENAFIL CITRATE 100 MG PO TABS: 100.0000 mg | ORAL_TABLET | Freq: Every day | ORAL | 3 refills | Status: DC | PRN

## 2020-01-26 NOTE — Progress Notes (Signed)
Hematology/Oncology follow up note [facility] Telephone:([phone] Fax:(336) [phone]   Patient Care Team: Delsa Grana, PA-C as PCP - General (Family Medicine) Wellington Hampshire, MD as Consulting Physician (Cardiology) Lavonia Dana, MD as Consulting Physician (Internal Medicine)  REFERRING PROVIDER: Delsa Grana, PA-C  CHIEF COMPLAINTS/REASON FOR VISIT:  Follow-up for leukopenia  HISTORY OF PRESENTING ILLNESS:  Marc Stevens is a 59 y.o. male who was seen in consultation at the request of Delsa Grana, PA-C for evaluation of leukopenia. Reviewed patient's recent labs.  12/29/2019 patient has low total WBC count was 3.3, predominantly low neutrophil with ANC 1.3. Patient denies any recent infection, vaccination. Previous lab records reviewed. Leukopenia duration is acute onset,   Associated symptoms:  denies fatigue, weight loss, fever, chills, frequent infection.  History hepatitis or HIV infection: Denies History of chronic liver disease: Denies History of blood transfusion: Denies Alcohol consumption: Denies Diet Vegetarian or Vegan: Denies Herbal medication: Denies  Patient drinks core water- alkaline water.  Denies any other over-the-counter supplementation. Patient reports unintentional weight loss of about 10 pounds recently.  His appetite is good. He always sweats a lot. Occasional alcohol use  INTERVAL HISTORY Marc Stevens is a 59 y.o. male who has above history reviewed by me today presents for follow up visit for management of leukopenia. Problems and complaints are listed below: During the interval, patient has had blood work done and present to discuss results. Patient gained 3 pounds since last visit.  Review of Systems  Constitutional: Negative for appetite change, chills, fatigue and fever.  HENT:   Negative for hearing loss and voice change.   Eyes: Negative for eye problems and icterus.  Respiratory: Negative  for chest tightness, cough and shortness of breath.   Cardiovascular: Negative for chest pain and leg swelling.  Gastrointestinal: Negative for abdominal distention and abdominal pain.  Endocrine: Negative for hot flashes.  Genitourinary: Negative for difficulty urinating, dysuria and frequency.   Musculoskeletal: Negative for arthralgias.  Skin: Negative for itching and rash.  Neurological: Negative for light-headedness and numbness.  Hematological: Negative for adenopathy. Does not bruise/bleed easily.  Psychiatric/Behavioral: Negative for confusion.    MEDICAL HISTORY:  Past Medical History:  Diagnosis Date  . BPH (benign prostatic hypertrophy)   . Complete rotator cuff tear 08/08/2015  . Diabetes mellitus without complication ([place])   . ED (erectile dysfunction)   . HTN (hypertension) 04/18/2012  . Hyperlipidemia   . Low testosterone   . Morbidly obese ([place])   . OSA (obstructive sleep apnea)    cpap - does not know settings   . Rotator cuff tear    RT  . Torn Achilles tendon 07/11/2015  . Torn Achilles tendon 07/11/2015    SURGICAL HISTORY: Past Surgical History:  Procedure Laterality Date  . COLONOSCOPY WITH PROPOFOL N/A 02/18/2018   Procedure: COLONOSCOPY WITH PROPOFOL;  Surgeon: Jonathon Bellows, MD;  Location: [facility] ENDOSCOPY;  Service: Gastroenterology;  Laterality: N/A;  . DOPPLER ECHOCARDIOGRAPHY  2013  . left kene arthroscopy     . LUMBAR LAMINECTOMY/DECOMPRESSION MICRODISCECTOMY N/A 01/30/2015   Procedure: MICRO LUMBAR DECOMPRESSION, BILATERAL FORAMINOTOMIES, MICRODISCECTOMY LUMBAR FOUR TO LUMBAR FIVE;  Surgeon: Susa Day, MD;  Location: [facility] ORS;  Service: Orthopedics;  Laterality: N/A;  . right knee surgery    . ROTATOR CUFF REPAIR  2007   Left  . SHOULDER OPEN ROTATOR CUFF REPAIR Right 08/08/2015   Procedure: RIGHT SHOULDER MINI OPEN ROTATOR CUFF REPAIR SUBACROMIAL DECOMPRESSION ;  Surgeon: Susa Day, MD;  Location: [facility] ORS;  Service: Orthopedics;  Laterality:  Right;    SOCIAL HISTORY: Social History   Socioeconomic History  . Marital status: Single    Spouse name: Not on file  . Number of children: Not on file  . Years of education: Not on file  . Highest education level: Not on file  Occupational History    Employer: [employer]    Employer: [employer]  Tobacco Use  . Smoking status: Former Smoker    Packs/day: 0.50    Years: 30.00    Pack years: 15.00    Types: Cigarettes    Quit date: 10/19/2004    Years since quitting: 15.2  . Smokeless tobacco: Never Used  Substance and Sexual Activity  . Alcohol use: Yes    Alcohol/week: 0.0 standard drinks    Comment: occasional  . Drug use: No  . Sexual activity: Yes    Birth control/protection: None  Other Topics Concern  . Not on file  Social History Narrative  . Not on file   Social Determinants of Health   Financial Resource Strain:   . Difficulty of Paying Living Expenses:   Food Insecurity:   . Worried About Charity fundraiser in the Last Year:   . Arboriculturist in the Last Year:   Transportation Needs:   . Film/video editor (Medical):   Marland Kitchen Lack of Transportation (Non-Medical):   Physical Activity:   . Days of Exercise per Week:   . Minutes of Exercise per Session:   Stress:   . Feeling of Stress :   Social Connections:   . Frequency of Communication with Friends and Family:   . Frequency of Social Gatherings with Friends and Family:   . Attends Religious Services:   . Active Member of Clubs or Organizations:   . Attends Archivist Meetings:   Marland Kitchen Marital Status:   Intimate Partner Violence:   . Fear of Current or Ex-Partner:   . Emotionally Abused:   Marland Kitchen Physically Abused:   . Sexually Abused:     FAMILY HISTORY: Family History  Problem Relation Age of Onset  . Dementia Mother   . Hypertension Mother   . Cancer Father        colon  . Heart disease Father   . Congestive Heart Failure Father   . Hypertension Sister   . Dementia Maternal Grandmother     . Kidney disease Neg Hx   . Prostate cancer Neg Hx   . COPD Neg Hx   . Diabetes Neg Hx   . Stroke Neg Hx   . Kidney cancer Neg Hx   . Bladder Cancer Neg Hx     ALLERGIES:  has No Known Allergies.  MEDICATIONS:  Current Outpatient Medications  Medication Sig Dispense Refill  . amLODipine (NORVASC) 2.5 MG tablet TAKE 1 TABLET BY MOUTH EVERY DAY 90 tablet 1  . aspirin EC 81 MG tablet Take 1 tablet (81 mg total) by mouth every morning. Resume 4 days post-op    . atorvastatin (LIPITOR) 10 MG tablet TAKE 1 TABLET BY MOUTH EVERYDAY AT BEDTIME 90 tablet 3  . carvedilol (COREG) 12.5 MG tablet TAKE 1 TABLET (12.5 MG TOTAL) BY MOUTH 2 (TWO) TIMES DAILY WITH A MEAL. 180 tablet 1  . gabapentin (NEURONTIN) 600 MG tablet Take 1 tablet (600 mg total) by mouth 3 (three) times daily. 270 tablet 3  . hydrochlorothiazide (HYDRODIURIL) 25 MG tablet TAKE 1 TABLET BY MOUTH EVERY DAY  90 tablet 1  . hydrOXYzine (ATARAX/VISTARIL) 10 MG tablet Take 1 tablet (10 mg total) by mouth 3 (three) times daily as needed. 30 tablet 0  . ketoconazole (NIZORAL) 2 % cream Apply 1 application topically 2 (two) times daily. 45 g 3  . losartan (COZAAR) 100 MG tablet TAKE 1 TABLET BY MOUTH EVERY DAY 90 tablet 1  . metFORMIN (GLUCOPHAGE-XR) 500 MG 24 hr tablet TAKE 2 TABLETS BY MOUTH EVERY DAY WITH BREAKFAST 180 tablet 1  . Multiple Vitamin (MULTIVITAMIN) tablet Take 1 tablet by mouth daily.    . sildenafil (VIAGRA) 100 MG tablet Take 1 tablet (100 mg total) by mouth daily as needed for erectile dysfunction. 30 tablet 3  . VITAMIN D, ERGOCALCIFEROL, PO Take 5,000 Units by mouth daily.     No current facility-administered medications for this visit.     PHYSICAL EXAMINATION: ECOG PERFORMANCE STATUS: 0 - Asymptomatic Vitals:   01/26/20 1030  BP: (!) 141/84  Pulse: 64  Resp: 18  Temp: (!) 96.5 F (35.8 C)   Filed Weights   01/26/20 1030  Weight: 268 lb 12.8 oz (121.9 kg)    Physical Exam Constitutional:       General: He is not in acute distress. HENT:     Head: Normocephalic and atraumatic.  Eyes:     General: No scleral icterus. Cardiovascular:     Rate and Rhythm: Normal rate and regular rhythm.     Heart sounds: Normal heart sounds.  Pulmonary:     Effort: Pulmonary effort is normal. No respiratory distress.     Breath sounds: No wheezing.  Abdominal:     General: Bowel sounds are normal. There is no distension.     Palpations: Abdomen is soft.  Musculoskeletal:        General: No deformity. Normal range of motion.     Cervical back: Normal range of motion and neck supple.  Skin:    General: Skin is warm and dry.     Findings: No erythema or rash.  Neurological:     Mental Status: He is alert and oriented to person, place, and time. Mental status is at baseline.     Cranial Nerves: No cranial nerve deficit.     Coordination: Coordination normal.  Psychiatric:        Mood and Affect: Mood normal.     RADIOGRAPHIC STUDIES: I have personally reviewed the radiological images as listed and agreed with the findings in the report.  CMP Latest Ref Rng & Units 12/29/2019  Glucose 65 - 99 mg/dL 95  BUN 7 - 25 mg/dL 14  Creatinine 0.70 - 1.33 mg/dL 1.04  Sodium 135 - 146 mmol/L 144  Potassium 3.5 - 5.3 mmol/L 3.6  Chloride 98 - 110 mmol/L 105  CO2 20 - 32 mmol/L 31  Calcium 8.6 - 10.3 mg/dL 9.6  Total Protein 6.1 - 8.1 g/dL 6.6  Total Bilirubin 0.2 - 1.2 mg/dL 0.7  Alkaline Phos 40 - 115 U/L -  AST 10 - 35 U/L 22  ALT 9 - 46 U/L 23   CBC Latest Ref Rng & Units 01/26/2020  WBC 4.0 - 10.5 K/uL 4.1  Hemoglobin 13.0 - 17.0 g/dL 12.7(L)  Hematocrit 39.0 - 52.0 % 42.0  Platelets 150 - 400 K/uL 292    LABORATORY DATA:  I have reviewed the data as listed Lab Results  Component Value Date   WBC 4.1 01/26/2020   HGB 12.7 (L) 01/26/2020   HCT 42.0 01/26/2020  MCV 64.1 (L) 01/26/2020   PLT 292 01/26/2020   Recent Labs    05/30/19 0846 12/29/19 1147  NA 143 144  K 3.6 3.6    CL 105 105  CO2 29 31  GLUCOSE 106* 95  BUN 19 14  CREATININE 1.24 1.04  CALCIUM 9.3 9.6  GFRNONAA 64 79  GFRAA 74 92  PROT 6.3 6.6  AST 26 22  ALT 31 23  BILITOT 0.5 0.7   Iron/TIBC/Ferritin/ %Sat No results found for: IRON, TIBC, FERRITIN, IRONPCTSAT   RADIOGRAPHIC STUDIES: I have personally reviewed the radiological images as listed and agreed with the findings in the report. No results found.    ASSESSMENT & PLAN:  1. Neutropenia, unspecified type [facility])    Labs reviewed and discussed with patient. Leukopenia/neutropenia has completely resolved.  Normal total white count and neutropenia. Negative HIV, hepatitis panel, ANA, negative flow cytometry.  Normal vitamin B12 and folate level Technologist smear review showed mixed RBC population with rare schistocytes and a few ovalocytes. I will repeat a CBC today and pathology smear.  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  Diagnosis of beta thalassemia minor was discussed with patient.  No intervention needed. I will discharge patient to follow-up with primary care provider.  If today's blood work showed any new concerns, I will contact him.  He agrees with the plan.  Orders Placed This Encounter  Procedures  . CBC with Differential/Platelet    Standing Status:   Future    Number of Occurrences:   1    Standing Expiration Date:   01/25/2021  . Pathologist smear review    Standing Status:   Future    Number of Occurrences:   1    Standing Expiration Date:   01/25/2021    All questions were answered. The patient knows to call the clinic with any problems questions or concerns.  Cc Delsa Grana, PA-C  Return of visit: No need for follow-up.  Follow-up with primary care provider. Thank you for this kind referral and the opportunity to participate in the care of this patient. A copy of today's note is routed to referring provider      Earlie Server, MD, PhD Hematology Oncology [facility] at [facility] Pager- [phone] 01/26/2020

## 2020-01-26 NOTE — Progress Notes (Signed)
Patient here for follow up. No concerns voiced.  °

## 2020-04-17 ENCOUNTER — Other Ambulatory Visit: Payer: Self-pay | Admitting: Family Medicine

## 2020-06-17 ENCOUNTER — Encounter (INDEPENDENT_AMBULATORY_CARE_PROVIDER_SITE_OTHER): Payer: Self-pay | Admitting: Vascular Surgery

## 2020-06-17 ENCOUNTER — Other Ambulatory Visit: Payer: Self-pay

## 2020-06-17 ENCOUNTER — Ambulatory Visit (INDEPENDENT_AMBULATORY_CARE_PROVIDER_SITE_OTHER): Payer: BC Managed Care – PPO | Admitting: Vascular Surgery

## 2020-06-17 VITALS — BP 138/85 | HR 65 | Ht 71.0 in | Wt 268.0 lb

## 2020-06-17 DIAGNOSIS — I890 Lymphedema, not elsewhere classified: Secondary | ICD-10-CM | POA: Diagnosis not present

## 2020-06-17 DIAGNOSIS — E1129 Type 2 diabetes mellitus with other diabetic kidney complication: Secondary | ICD-10-CM

## 2020-06-17 DIAGNOSIS — I10 Essential (primary) hypertension: Secondary | ICD-10-CM | POA: Diagnosis not present

## 2020-06-17 DIAGNOSIS — E782 Mixed hyperlipidemia: Secondary | ICD-10-CM | POA: Diagnosis not present

## 2020-06-17 NOTE — Progress Notes (Signed)
MRN : [id number]  Marc Stevens is a 59 y.o. (December 18, 1960) male who presents with chief complaint of No chief complaint on file. Marland Kitchen  History of Present Illness:  The patient returns to the office for followup evaluation regarding leg swelling.  The swelling has persisted but with the lymph pump the patient states the swelling is much better controlled. The pain associated with swelling is essentially eliminated. There have not been any interval development of a ulcerations or wounds.  No episodes of cellulitis or infection over the past 12 months  The patient denies problems with the pump, noting it is working well and the leggings are in good condition.  Since the previous visit the patient has been wearing graduated compression stockings and using the lymph pump on a routine basis and  has noted significant improvement in the lymphedema.   Patient stated the lymph pump has been a very positive factor in his care.   No outpatient medications have been marked as taking for the 06/17/20 encounter (Office Visit) with Delana Meyer, Dolores Lory, MD.    Past Medical History:  Diagnosis Date  . BPH (benign prostatic hypertrophy)   . Complete rotator cuff tear 08/08/2015  . Diabetes mellitus without complication ([place])   . ED (erectile dysfunction)   . HTN (hypertension) 04/18/2012  . Hyperlipidemia   . Low testosterone   . Morbidly obese ([place])   . OSA (obstructive sleep apnea)    cpap - does not know settings   . Rotator cuff tear    RT  . Torn Achilles tendon 07/11/2015  . Torn Achilles tendon 07/11/2015    Past Surgical History:  Procedure Laterality Date  . COLONOSCOPY WITH PROPOFOL N/A 02/18/2018   Procedure: COLONOSCOPY WITH PROPOFOL;  Surgeon: Jonathon Bellows, MD;  Location: [facility] ENDOSCOPY;  Service: Gastroenterology;  Laterality: N/A;  . DOPPLER ECHOCARDIOGRAPHY  2013  . left kene arthroscopy     . LUMBAR LAMINECTOMY/DECOMPRESSION MICRODISCECTOMY N/A 01/30/2015   Procedure: MICRO  LUMBAR DECOMPRESSION, BILATERAL FORAMINOTOMIES, MICRODISCECTOMY LUMBAR FOUR TO LUMBAR FIVE;  Surgeon: Susa Day, MD;  Location: [facility] ORS;  Service: Orthopedics;  Laterality: N/A;  . right knee surgery    . ROTATOR CUFF REPAIR  2007   Left  . SHOULDER OPEN ROTATOR CUFF REPAIR Right 08/08/2015   Procedure: RIGHT SHOULDER MINI OPEN ROTATOR CUFF REPAIR SUBACROMIAL DECOMPRESSION ;  Surgeon: Susa Day, MD;  Location: [facility] ORS;  Service: Orthopedics;  Laterality: Right;    Social History Social History   Tobacco Use  . Smoking status: Former Smoker    Packs/day: 0.50    Years: 30.00    Pack years: 15.00    Types: Cigarettes    Quit date: 10/19/2004    Years since quitting: 15.6  . Smokeless tobacco: Never Used  Vaping Use  . Vaping Use: Never used  Substance Use Topics  . Alcohol use: Yes    Alcohol/week: 0.0 standard drinks    Comment: occasional  . Drug use: No    Family History Family History  Problem Relation Age of Onset  . Dementia Mother   . Hypertension Mother   . Cancer Father        colon  . Heart disease Father   . Congestive Heart Failure Father   . Hypertension Sister   . Dementia Maternal Grandmother   . Kidney disease Neg Hx   . Prostate cancer Neg Hx   . COPD Neg Hx   . Diabetes Neg Hx   .  Stroke Neg Hx   . Kidney cancer Neg Hx   . Bladder Cancer Neg Hx     No Known Allergies   REVIEW OF SYSTEMS (Negative unless checked)  Constitutional: [] Weight loss  [] Fever  [] Chills Cardiac: [] Chest pain   [] Chest pressure   [] Palpitations   [] Shortness of breath when laying flat   [] Shortness of breath with exertion. Vascular:  [] Pain in legs with walking   [] Pain in legs at rest  [] History of DVT   [] Phlebitis   [x] Swelling in legs   [] Varicose veins   [] Non-healing ulcers Pulmonary:   [] Uses home oxygen   [] Productive cough   [] Hemoptysis   [] Wheeze  [] COPD   [] Asthma Neurologic:  [] Dizziness   [] Seizures   [] History of stroke   [] History of TIA  [] Aphasia    [] Vissual changes   [] Weakness or numbness in arm   [x] Weakness or numbness in leg Musculoskeletal:   [] Joint swelling   [] Joint pain   [] Low back pain Hematologic:  [] Easy bruising  [] Easy bleeding   [] Hypercoagulable state   [] Anemic Gastrointestinal:  [] Diarrhea   [] Vomiting  [] Gastroesophageal reflux/heartburn   [] Difficulty swallowing. Genitourinary:  [] Chronic kidney disease   [] Difficult urination  [] Frequent urination   [] Blood in urine Skin:  [] Rashes   [] Ulcers  Psychological:  [] History of anxiety   []  History of major depression.  Physical Examination  Vitals:   06/17/20 0817  BP: 138/85  Pulse: 65  Weight: 268 lb (121.6 kg)  Height: 5\' 11"  (1.803 m)   Body mass index is 37.38 kg/m. Gen: WD/WN, NAD Head: [place]/AT, No temporalis wasting.  Ear/Nose/Throat: Hearing grossly intact, nares w/o erythema or drainage Eyes: PER, EOMI, sclera nonicteric.  Neck: Supple, no large masses.   Pulmonary:  Good air movement, no audible wheezing bilaterally, no use of accessory muscles.  Cardiac: RRR, no JVD Vascular: scattered varicosities present bilaterally.  Mild to moderate venous stasis changes to the legs bilaterally.  2+ hard Non-pitting edema Vessel Right Left  Radial Palpable Palpable  Gastrointestinal: Non-distended. No guarding/no peritoneal signs.  Musculoskeletal: M/S 5/5 throughout.  No deformity or atrophy.  Neurologic: CN 2-12 intact. Symmetrical.  Speech is fluent. Motor exam as listed above. Psychiatric: Judgment intact, Mood & affect appropriate for pt's clinical situation. Dermatologic: No rashes or ulcers noted.  No changes consistent with cellulitis. Lymph : + lichenification and skin changes of chronic lymphedema.  CBC Lab Results  Component Value Date   WBC 4.1 01/26/2020   HGB 12.7 (L) 01/26/2020   HCT 42.0 01/26/2020   MCV 64.1 (L) 01/26/2020   PLT 292 01/26/2020    BMET    Component Value Date/Time   NA 144 12/29/2019 1147   NA 141 02/21/2016 1110     NA 144 04/04/2014 1613   K 3.6 12/29/2019 1147   K 3.7 04/04/2014 1613   CL 105 12/29/2019 1147   CL 109 (H) 04/04/2014 1613   CO2 31 12/29/2019 1147   CO2 28 04/04/2014 1613   GLUCOSE 95 12/29/2019 1147   GLUCOSE 96 04/04/2014 1613   BUN 14 12/29/2019 1147   BUN 19 02/21/2016 1110   BUN 15 04/04/2014 1613   CREATININE 1.04 12/29/2019 1147   CALCIUM 9.6 12/29/2019 1147   CALCIUM 8.6 04/04/2014 1613   GFRNONAA 79 12/29/2019 1147   GFRAA 92 12/29/2019 1147   CrCl cannot be calculated (Patient's most recent lab result is older than the maximum 21 days allowed.).  COAG Lab Results  Component Value  Date   INR 0.90 04/17/2012    Radiology No results found.   Assessment/Plan 1. Lymphedema  No surgery or intervention at this point in time.    I have reviewed my discussion with the patient regarding lymphedema and why it  causes symptoms.  Patient will continue wearing graduated compression stockings class 1 (20-30 mmHg) on a daily basis a prescription was given. The patient is reminded to put the stockings on first thing in the morning and removing them in the evening. The patient is instructed specifically not to sleep in the stockings.   In addition, behavioral modification throughout the day will be continued.  This will include frequent elevation (such as in a recliner), use of over the counter pain medications as needed and exercise such as walking.  I have reviewed systemic causes for chronic edema such as liver, kidney and cardiac etiologies and there does not appear to be any significant changes in these organ systems over the past year.  The patient is under the impression that these organ systems are all stable and unchanged.    The patient will continue aggressive use of the  lymph pump.  This will continue to improve the edema control and prevent sequela such as ulcers and infections.   The patient will follow-up with me on an annual basis.    2. Essential  hypertension Continue antihypertensive medications as already ordered, these medications have been reviewed and there are no changes at this time.   3. Type 2 diabetes mellitus with other diabetic kidney complication (HCC) Continue hypoglycemic medications as already ordered, these medications have been reviewed and there are no changes at this time.  Hgb A1C to be monitored as already arranged by primary service   4. Mixed hyperlipidemia Continue statin as ordered and reviewed, no changes at this time     Hortencia Pilar, MD  06/17/2020 8:18 AM

## 2020-06-28 ENCOUNTER — Ambulatory Visit: Payer: BC Managed Care – PPO | Admitting: Family Medicine

## 2020-06-28 ENCOUNTER — Encounter: Payer: Self-pay | Admitting: Family Medicine

## 2020-06-28 ENCOUNTER — Other Ambulatory Visit: Payer: Self-pay

## 2020-06-28 VITALS — BP 124/78 | HR 64 | Temp 98.3°F | Resp 18 | Ht 71.0 in | Wt 267.5 lb

## 2020-06-28 DIAGNOSIS — Z6837 Body mass index (BMI) 37.0-37.9, adult: Secondary | ICD-10-CM

## 2020-06-28 DIAGNOSIS — I10 Essential (primary) hypertension: Secondary | ICD-10-CM

## 2020-06-28 DIAGNOSIS — E1129 Type 2 diabetes mellitus with other diabetic kidney complication: Secondary | ICD-10-CM

## 2020-06-28 DIAGNOSIS — I517 Cardiomegaly: Secondary | ICD-10-CM

## 2020-06-28 DIAGNOSIS — D563 Thalassemia minor: Secondary | ICD-10-CM

## 2020-06-28 DIAGNOSIS — R7989 Other specified abnormal findings of blood chemistry: Secondary | ICD-10-CM

## 2020-06-28 DIAGNOSIS — E782 Mixed hyperlipidemia: Secondary | ICD-10-CM

## 2020-06-28 DIAGNOSIS — I890 Lymphedema, not elsewhere classified: Secondary | ICD-10-CM

## 2020-06-28 DIAGNOSIS — R809 Proteinuria, unspecified: Secondary | ICD-10-CM

## 2020-06-28 MED ORDER — ATORVASTATIN CALCIUM 10 MG PO TABS: 10.0000 mg | ORAL_TABLET | Freq: Every day | ORAL | 3 refills | Status: DC

## 2020-06-28 MED ORDER — LOSARTAN POTASSIUM-HCTZ 100-25 MG PO TABS: 1.0000 | ORAL_TABLET | Freq: Every day | ORAL | 3 refills | Status: DC

## 2020-06-28 NOTE — Progress Notes (Signed)
Name: Marc Stevens   MRN: [id number]    DOB: 04-14-1961   Date:06/28/2020       Progress Note  Chief Complaint  Patient presents with  . Follow-up  . Hyperlipidemia  . Hypertension  . Diabetes     Subjective:   Marc Stevens is a 59 y.o. male, presents to clinic for   DM:  DM vs Prediabetes? Highest A1C back to 2013 was 6.4, which was his last labs here about 6 months ago. Pt managing with metformin 500 mg XR - taking 1000 mg once daily Reports good med compliance Pt has no SE from meds. Blood sugars -not checking Denies: Polyuria, polydipsia, vision changes, neuropathy, hypoglycemia Recent pertinent labs: Lab Results  Component Value Date   HGBA1C 6.4 (H) 12/29/2019   HGBA1C 6.0 (H) 05/30/2019   HGBA1C 6.2 (H) 11/18/2018  due for eye exam this year, pt want ophto referral again to Dr. Gloriann Loan Hx of renal disease and proteinuria - seeing neprhology Dr. Juleen China   Hypertension:  Currently managed on norvasc 2.5 mg, losartan 100 mg, HCTZ 25 mg and carvedilol 12.5 mg BID Pt reports good med compliance and denies any SE.   Blood pressure today is well controlled. BP Readings from Last 6 Encounters:  06/28/20 124/78  06/17/20 138/85  01/26/20 (!) 141/84  01/12/20 129/79  12/29/19 122/78  09/01/19 122/74   Pt denies CP, SOB, exertional sx, LE edema, palpitation, Ha's, visual disturbances, lightheadedness, hypotension, syncope. He would like to decrease meds or pills if possible  Hyperlipidemia: Currently treated with atorvastatin 10 mg , pt reports good med compliance Last Lipids: Lab Results  Component Value Date   CHOL 133 12/29/2019   HDL 42 12/29/2019   LDLCALC 70 12/29/2019   TRIG 129 12/29/2019   CHOLHDL 3.2 12/29/2019   - Denies: Chest pain, shortness of breath, myalgias, claudication   Obesity: Weight stable, BMI 37, he has been working on diet and exercise, has a goal of 250, then once he hits that he'd like to loose a little more  weight. Wt Readings from Last 5 Encounters:  06/28/20 267 lb 8 oz (121.3 kg)  06/17/20 268 lb (121.6 kg)  01/26/20 268 lb 12.8 oz (121.9 kg)  01/12/20 265 lb 12.8 oz (120.6 kg)  12/29/19 264 lb 9.6 oz (120 kg)   BMI Readings from Last 5 Encounters:  06/28/20 37.31 kg/m  06/17/20 37.38 kg/m  01/26/20 37.49 kg/m  01/12/20 37.07 kg/m  12/29/19 36.90 kg/m   Trying to get down to 250's     Current Outpatient Medications:  .  amLODipine (NORVASC) 2.5 MG tablet, TAKE 1 TABLET BY MOUTH EVERY DAY, Disp: 90 tablet, Rfl: 3 .  aspirin EC 81 MG tablet, Take 1 tablet (81 mg total) by mouth every morning. Resume 4 days post-op, Disp: , Rfl:  .  atorvastatin (LIPITOR) 10 MG tablet, Take 1 tablet (10 mg total) by mouth at bedtime., Disp: 90 tablet, Rfl: 3 .  carvedilol (COREG) 12.5 MG tablet, TAKE 1 TABLET (12.5 MG TOTAL) BY MOUTH 2 (TWO) TIMES DAILY WITH A MEAL., Disp: 180 tablet, Rfl: 1 .  gabapentin (NEURONTIN) 600 MG tablet, Take 1 tablet (600 mg total) by mouth 3 (three) times daily., Disp: 270 tablet, Rfl: 3 .  hydrOXYzine (ATARAX/VISTARIL) 10 MG tablet, Take 1 tablet (10 mg total) by mouth 3 (three) times daily as needed., Disp: 30 tablet, Rfl: 0 .  ketoconazole (NIZORAL) 2 % cream, Apply 1 application topically 2 (two)  times daily., Disp: 45 g, Rfl: 3 .  metFORMIN (GLUCOPHAGE-XR) 500 MG 24 hr tablet, TAKE 2 TABLETS BY MOUTH EVERY DAY WITH BREAKFAST, Disp: 180 tablet, Rfl: 1 .  Multiple Vitamin (MULTIVITAMIN) tablet, Take 1 tablet by mouth daily., Disp: , Rfl:  .  sildenafil (VIAGRA) 100 MG tablet, Take 1 tablet (100 mg total) by mouth daily as needed for erectile dysfunction., Disp: 30 tablet, Rfl: 3 .  VITAMIN D, ERGOCALCIFEROL, PO, Take 5,000 Units by mouth daily., Disp: , Rfl:  .  diclofenac Sodium (VOLTAREN) 1 % GEL, diclofenac 1 % topical gel  APPLY 2 GRAMS TO THE AFFECTED AREA(S) BY TOPICAL ROUTE 4 TIMES PER DAY, Disp: , Rfl:  .  losartan-hydrochlorothiazide (HYZAAR) 100-25 MG  tablet, Take 1 tablet by mouth daily., Disp: 90 tablet, Rfl: 3  Patient Active Problem List   Diagnosis Date Noted  . Benign essential hypertension 11/07/2019  . Proteinuria 11/07/2019  . Type 2 diabetes mellitus with other diabetic kidney complication ([place]) 70/62/3762  . Lymphedema 06/16/2018  . 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 Prediabetes 08/13/2017  . Mitral regurgitation 01/17/2016  . LVH (left ventricular hypertrophy) 01/01/2016  . Low testosterone 05/14/2015  . 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 Vitamin D deficiency 04/26/2015  . Beta thalassemia minor 04/26/2015  . Class 2 severe obesity with serious comorbidity and body mass index (BMI) of 37.0 to 37.9 in adult [facility])   . ED (erectile dysfunction)   . Hyperlipidemia   . OSA (obstructive sleep apnea)   . HTN (hypertension) 04/18/2012    Past Surgical History:  Procedure Laterality Date  . COLONOSCOPY WITH PROPOFOL N/A 02/18/2018   Procedure: COLONOSCOPY WITH PROPOFOL;  Surgeon: Jonathon Bellows, MD;  Location: [facility] ENDOSCOPY;  Service: Gastroenterology;  Laterality: N/A;  . DOPPLER ECHOCARDIOGRAPHY  2013  . left kene arthroscopy     . LUMBAR LAMINECTOMY/DECOMPRESSION MICRODISCECTOMY N/A 01/30/2015   Procedure: MICRO LUMBAR DECOMPRESSION, BILATERAL FORAMINOTOMIES, MICRODISCECTOMY LUMBAR FOUR TO LUMBAR FIVE;  Surgeon: Susa Day, MD;  Location: [facility] ORS;  Service: Orthopedics;  Laterality: N/A;  . right knee surgery    . ROTATOR CUFF REPAIR  2007   Left  . SHOULDER OPEN ROTATOR CUFF REPAIR Right 08/08/2015   Procedure: RIGHT SHOULDER MINI OPEN ROTATOR CUFF REPAIR SUBACROMIAL DECOMPRESSION ;  Surgeon: Susa Day, MD;  Location: [facility] ORS;  Service: Orthopedics;  Laterality: Right;    Family History  Problem Relation Age of Onset  . Dementia Mother   . Hypertension Mother   . Cancer Father        colon  . Heart disease Father   . Congestive Heart Failure Father   . Hypertension Sister   . Dementia  Maternal Grandmother   . Kidney disease Neg Hx   . Prostate cancer Neg Hx   . COPD Neg Hx   . Diabetes Neg Hx   . Stroke Neg Hx   . Kidney cancer Neg Hx   . Bladder Cancer Neg Hx     Social History   Tobacco Use  . Smoking status: Former Smoker    Packs/day: 0.50    Years: 30.00    Pack years: 15.00    Types: Cigarettes    Quit date: 10/19/2004    Years since quitting: 15.7  . Smokeless tobacco: Never Used  Vaping Use  . Vaping Use: Never used  Substance Use Topics  . Alcohol use: Yes    Alcohol/week: 0.0 standard drinks    Comment: occasional  .  Drug use: No     No Known Allergies  Health Maintenance  Topic Date Due  . FOOT EXAM  Never done  . OPHTHALMOLOGY EXAM  Never done  . COVID-19 Vaccine (1) 07/14/2020 (Originally 02/10/1973)  . INFLUENZA VACCINE  01/16/2021 (Originally 05/19/2020)  . PNEUMOCOCCAL POLYSACCHARIDE VACCINE AGE 89-64 HIGH RISK  06/28/2021 (Originally 02/11/1964)  . HEMOGLOBIN A1C  06/30/2020  . COLONOSCOPY  02/19/2023  . TETANUS/TDAP  11/14/2027  . Hepatitis C Screening  Completed  . HIV Screening  Completed    Chart Review Today: I personally reviewed active problem list, medication list, allergies, family history, social history, health maintenance, notes from last encounter, lab results, imaging with the patient/caregiver today.   Review of Systems  10 Systems reviewed and are negative for acute change except as noted in the HPI.  Objective:   Vitals:   06/28/20 1120  BP: 124/78  Pulse: 64  Resp: 18  Temp: 98.3 F (36.8 C)  TempSrc: Oral  SpO2: 94%  Weight: 267 lb 8 oz (121.3 kg)  Height: 5\' 11"  (1.803 m)    Body mass index is 37.31 kg/m.  Physical Exam Vitals and nursing note reviewed.  Constitutional:      General: He is not in acute distress.    Appearance: Normal appearance. He is well-developed. He is obese. He is not ill-appearing, toxic-appearing or diaphoretic.     Interventions: Face mask in place.  HENT:     Head:  Normocephalic and atraumatic.     Jaw: No trismus.     Right Ear: External ear normal.     Left Ear: External ear normal.  Eyes:     General: Lids are normal. No scleral icterus.       Right eye: No discharge.        Left eye: No discharge.     Conjunctiva/sclera: Conjunctivae normal.  Neck:     Trachea: Trachea and phonation normal. No tracheal deviation.  Cardiovascular:     Rate and Rhythm: Normal rate and regular rhythm.     Pulses: Normal pulses.          Radial pulses are 2+ on the right side and 2+ on the left side.       Posterior tibial pulses are 2+ on the right side and 2+ on the left side.     Heart sounds: Normal heart sounds. No murmur heard.  No friction rub. No gallop.   Pulmonary:     Effort: Pulmonary effort is normal. No respiratory distress.     Breath sounds: Normal breath sounds. No stridor. No wheezing, rhonchi or rales.  Abdominal:     General: Bowel sounds are normal. There is no distension.     Palpations: Abdomen is soft.  Musculoskeletal:     Right lower leg: No edema.     Left lower leg: No edema.  Skin:    General: Skin is warm and dry.     Coloration: Skin is not jaundiced or pale.     Findings: No rash.     Nails: There is no clubbing.  Neurological:     Mental Status: He is alert. Mental status is at baseline.     Cranial Nerves: No dysarthria or facial asymmetry.     Motor: No weakness, tremor or abnormal muscle tone.     Coordination: Coordination normal.     Gait: Gait normal.  Psychiatric:        Mood and Affect: Mood normal.  Speech: Speech normal.        Behavior: Behavior normal. Behavior is cooperative.         Assessment & Plan:    Pt here for 6 month f/up:    ICD-10-CM   1. Essential hypertension  J44 COMPLETE METABOLIC PANEL WITH GFR    losartan-hydrochlorothiazide (HYZAAR) 100-25 MG tablet   stable, well controlled, d/c losartan and HCTZ - start combo pill same dose, continue coreg and if BP stays under 130/80  may d/c norvasc? keep working on DASH  2. Mixed hyperlipidemia  E78.2 Lipid panel    atorvastatin (LIPITOR) 10 MG tablet   stable, well controlled, tolerating statin w/o SE or concerns  3. Type 2 diabetes mellitus with other diabetic kidney complication (HCC)  B20.10 Hemoglobin A1C    Ambulatory referral to Ophthalmology   A1C well controlled but increasing, on 1000 mg metformin, unclear if dx was T2DM or prediabetes? recheck labs  4. Beta thalassemia minor  D56.3    abnormal labs, pt consulted with hematology, Dr. Tasia Catchings, one month later rechecked labs normalized, dx with beta thalassemia minor, no labs today - monitor annually  5. Class 2 severe obesity with serious comorbidity and body mass index (BMI) of 37.0 to 37.9 in adult, unspecified obesity type ([place])  E66.01    Z68.37    encouraged continued dietary and lifestyle efforts  6. LVH (left ventricular hypertrophy)  I51.7    BP well controlled, no current sx concerning for CHF - last ECHO reviewed in chart today  7. Low testosterone  R79.89    last labs reviewed and normal  8. Proteinuria, unspecified type  R80.9    managed by nephrology - reviewed last OV and labs  9. Lymphedema  I89.0    managed by vascular specialists - well controlled/stable sx, reviewed last vasc OV       Return in about 6 months (around 12/26/2020) for Routine follow-up, Annual Physical.   Delsa Grana, PA-C 06/28/20 11:08 AM

## 2020-06-29 LAB — COMPLETE METABOLIC PANEL WITH GFR
AG Ratio: 2.1 (calc) (ref 1.0–2.5)
ALT: 20 U/L (ref 9–46)
AST: 18 U/L (ref 10–35)
Albumin: 4.4 g/dL (ref 3.6–5.1)
Alkaline phosphatase (APISO): 57 U/L (ref 35–144)
BUN: 14 mg/dL (ref 7–25)
CO2: 29 mmol/L (ref 20–32)
Calcium: 9.6 mg/dL (ref 8.6–10.3)
Chloride: 102 mmol/L (ref 98–110)
Creat: 1.09 mg/dL (ref 0.70–1.33)
GFR, Est African American: 86 mL/min/{1.73_m2} (ref >=60)
GFR, Est Non African American: 74 mL/min/{1.73_m2} (ref >=60)
Globulin: 2.1 g/dL (calc) (ref 1.9–3.7)
Glucose, Bld: 96 mg/dL (ref 65–99)
Potassium: 4 mmol/L (ref 3.5–5.3)
Sodium: 142 mmol/L (ref 135–146)
Total Bilirubin: 0.8 mg/dL (ref 0.2–1.2)
Total Protein: 6.5 g/dL (ref 6.1–8.1)

## 2020-06-29 LAB — LIPID PANEL
Cholesterol: 139 mg/dL (ref <200)
HDL: 60 mg/dL (ref >=40)
LDL Cholesterol (Calc): 64 mg/dL (calc)
Non-HDL Cholesterol (Calc): 79 mg/dL (calc) (ref <130)
Total CHOL/HDL Ratio: 2.3 (calc) (ref <5.0)
Triglycerides: 73 mg/dL (ref <150)

## 2020-06-29 LAB — HEMOGLOBIN A1C
Hgb A1c MFr Bld: 5.8 % of total Hgb — ABNORMAL HIGH (ref <5.7)
Mean Plasma Glucose: 120 (calc)
eAG (mmol/L): 6.6 (calc)

## 2020-07-02 ENCOUNTER — Other Ambulatory Visit: Payer: Self-pay | Admitting: Family Medicine

## 2020-07-02 ENCOUNTER — Other Ambulatory Visit: Payer: Self-pay

## 2020-07-02 DIAGNOSIS — I10 Essential (primary) hypertension: Secondary | ICD-10-CM

## 2020-07-02 MED ORDER — CARVEDILOL 12.5 MG PO TABS: 12.5000 mg | ORAL_TABLET | Freq: Two times a day (BID) | ORAL | 3 refills | Status: DC

## 2020-07-02 MED ORDER — METFORMIN HCL ER 500 MG PO TB24: ORAL_TABLET | ORAL | 3 refills | Status: DC

## 2020-07-03 ENCOUNTER — Other Ambulatory Visit: Payer: Self-pay | Admitting: Family Medicine

## 2020-07-05 ENCOUNTER — Ambulatory Visit: Payer: BC Managed Care – PPO | Admitting: Family Medicine

## 2020-07-07 ENCOUNTER — Other Ambulatory Visit: Payer: Self-pay | Admitting: Family Medicine

## 2020-07-07 DIAGNOSIS — I10 Essential (primary) hypertension: Secondary | ICD-10-CM

## 2020-07-07 NOTE — Progress Notes (Signed)
8:44 AM   Marc Stevens 11-29-1960 [id number]  Referring provider: [facility], [address] [place],  [place] [address]  Chief Complaint  Patient presents with  . Follow-up    1 year follow-up    HPI: Patient is a 59 year old male with erectile dysfunction, and mild BPH with LUTS who presents today for 1 year follow-up.  ED His SHIM score is 12, which is mild to moderate ED.  His previous SHIM was 11.  His major complaint is maintaining an erection.     His risk factors for ED are HTN, sleep apnea, BPH, obesity, hyperlipidemia and age.   He denies any pain with erections or curvature with erections.  He has good success with the sildenafil 100 mg prn.     SHIM    Row Name 07/08/20 [phone]         SHIM: Over the last 6 months:   How do you rate your confidence that you could get and keep an erection? Low     When you had erections with sexual stimulation, how often were your erections hard enough for penetration (entering your partner)? A Few Times (much less than half the time)     During sexual intercourse, how often were you able to maintain your erection after you had penetrated (entered) your partner? A Few Times (much less than half the time)     During sexual intercourse, how difficult was it to maintain your erection to completion of intercourse? Slightly Difficult     When you attempted sexual intercourse, how often was it satisfactory for you? A Few Times (much less than half the time)       SHIM Total Score   SHIM 12            Score: 1-7 Severe ED 8-11 Moderate ED 12-16 Mild-Moderate ED 17-21 Mild ED 22-25 No ED  BPH WITH LUTS  (prostate and/or bladder) IPSS score: 2/2   Previous score: 5/3     Major complaint(s):  No complaints.  Patient denies any modifying or aggravating factors.  Patient denies any gross hematuria, dysuria or suprapubic/flank pain.  Patient denies any fevers, chills, nausea or vomiting.   ?  Father having prostate cancer.     IPSS    Row Name 07/08/20 0800         International Prostate Symptom Score   How often have you had the sensation of not emptying your bladder? Not at All     How often have you had to urinate less than every two hours? Not at All     How often have you found you stopped and started again several times when you urinated? Not at All     How often have you found it difficult to postpone urination? Not at All     How often have you had a weak urinary stream? Less than 1 in 5 times     How often have you had to strain to start urination? Not at All     How many times did you typically get up at night to urinate? 1 Time     Total IPSS Score 2       Quality of Life due to urinary symptoms   If you were to spend the rest of your life with your urinary condition just the way it is now how would you feel about that? Mostly Satisfied  Score:  1-7 Mild 8-19 Moderate 20-35 Severe  PMH: Past Medical History:  Diagnosis Date  . BPH (benign prostatic hypertrophy)   . Complete rotator cuff tear 08/08/2015  . Diabetes mellitus without complication ([place])   . ED (erectile dysfunction)   . HTN (hypertension) 04/18/2012  . Hyperlipidemia   . Low testosterone   . Morbidly obese ([place])   . OSA (obstructive sleep apnea)    cpap - does not know settings   . Rotator cuff tear    RT  . Torn Achilles tendon 07/11/2015  . Torn Achilles tendon 07/11/2015    Surgical History: Past Surgical History:  Procedure Laterality Date  . COLONOSCOPY WITH PROPOFOL N/A 02/18/2018   Procedure: COLONOSCOPY WITH PROPOFOL;  Surgeon: Jonathon Bellows, MD;  Location: [facility] ENDOSCOPY;  Service: Gastroenterology;  Laterality: N/A;  . DOPPLER ECHOCARDIOGRAPHY  2013  . left kene arthroscopy     . LUMBAR LAMINECTOMY/DECOMPRESSION MICRODISCECTOMY N/A 01/30/2015   Procedure: MICRO LUMBAR DECOMPRESSION, BILATERAL FORAMINOTOMIES, MICRODISCECTOMY LUMBAR FOUR TO LUMBAR FIVE;  Surgeon:  Susa Day, MD;  Location: [facility] ORS;  Service: Orthopedics;  Laterality: N/A;  . right knee surgery    . ROTATOR CUFF REPAIR  2007   Left  . SHOULDER OPEN ROTATOR CUFF REPAIR Right 08/08/2015   Procedure: RIGHT SHOULDER MINI OPEN ROTATOR CUFF REPAIR SUBACROMIAL DECOMPRESSION ;  Surgeon: Susa Day, MD;  Location: [facility] ORS;  Service: Orthopedics;  Laterality: Right;    Home Medications:  Allergies as of 07/08/2020   No Known Allergies     Medication List       Accurate as of July 08, 2020  8:44 AM. If you have any questions, ask your nurse or doctor.        amLODipine 2.5 MG tablet Commonly known as: NORVASC TAKE 1 TABLET BY MOUTH EVERY DAY   aspirin EC 81 MG tablet Take 1 tablet (81 mg total) by mouth every morning. Resume 4 days post-op   atorvastatin 10 MG tablet Commonly known as: LIPITOR Take 1 tablet (10 mg total) by mouth at bedtime.   carvedilol 12.5 MG tablet Commonly known as: COREG Take 1 tablet (12.5 mg total) by mouth 2 (two) times daily with a meal.   diclofenac Sodium 1 % Gel Commonly known as: VOLTAREN diclofenac 1 % topical gel  APPLY 2 GRAMS TO THE AFFECTED AREA(S) BY TOPICAL ROUTE 4 TIMES PER DAY   gabapentin 600 MG tablet Commonly known as: Neurontin Take 1 tablet (600 mg total) by mouth 3 (three) times daily.   hydrOXYzine 10 MG tablet Commonly known as: ATARAX/VISTARIL Take 1 tablet (10 mg total) by mouth 3 (three) times daily as needed.   ketoconazole 2 % cream Commonly known as: NIZORAL Apply 1 application topically 2 (two) times daily.   losartan-hydrochlorothiazide 100-25 MG tablet Commonly known as: HYZAAR Take 1 tablet by mouth daily.   metFORMIN 500 MG 24 hr tablet Commonly known as: GLUCOPHAGE-XR TAKE 2 TABLETS BY MOUTH EVERY DAY WITH BREAKFAST   multivitamin tablet Take 1 tablet by mouth daily.   sildenafil 100 MG tablet Commonly known as: VIAGRA Take 1 tablet (100 mg total) by mouth daily as needed for erectile  dysfunction.   VITAMIN D (ERGOCALCIFEROL) PO Take 5,000 Units by mouth daily.       Allergies: No Known Allergies  Family History: Family History  Problem Relation Age of Onset  . Dementia Mother   . Hypertension Mother   . Cancer Father  colon  . Heart disease Father   . Congestive Heart Failure Father   . Hypertension Sister   . Dementia Maternal Grandmother   . Kidney disease Neg Hx   . Prostate cancer Neg Hx   . COPD Neg Hx   . Diabetes Neg Hx   . Stroke Neg Hx   . Kidney cancer Neg Hx   . Bladder Cancer Neg Hx     Social History:  reports that he quit smoking about 15 years ago. His smoking use included cigarettes. He has a 15.00 pack-year smoking history. He has never used smokeless tobacco. He reports current alcohol use. He reports that he does not use drugs.  ROS: For pertinent review of systems please refer to history of present illness  Physical Exam: Blood pressure (!) 149/87, pulse 60, height 5\' 11"  (1.803 m), weight 268 lb (121.6 kg). Constitutional:  Well nourished. Alert and oriented, No acute distress. HEENT: [place] AT, mask in place.  Trachea midline Cardiovascular: No clubbing, cyanosis, or edema. Respiratory: Normal respiratory effort, no increased work of breathing. Neurologic: Grossly intact, no focal deficits, moving all 4 extremities. Psychiatric: Normal mood and affect.   Laboratory Data: PSA Trend  1.8 in 02/2013  1.2 in 02/2016  1.4 in 05/2016  Component     Latest Ref Rng & Units 06/11/2017 07/06/2019  Prostatic Specific Antigen     0.00 - 4.00 ng/mL 1.39 1.12    Results for orders placed or performed in visit on 06/28/20  Hemoglobin A1C  Result Value Ref Range   Hgb A1c MFr Bld 5.8 (H) <5.7 % of total Hgb   Mean Plasma Glucose 120 (calc)   eAG (mmol/L) 6.6 (calc)  COMPLETE METABOLIC PANEL WITH GFR  Result Value Ref Range   Glucose, Bld 96 65 - 99 mg/dL   BUN 14 7 - 25 mg/dL   Creat 1.09 0.70 - 1.33 mg/dL   GFR, Est Non  African American 74 > OR = 60 mL/min/1.47m2   GFR, Est African American 86 > OR = 60 mL/min/1.89m2   BUN/Creatinine Ratio NOT APPLICABLE 6 - 22 (calc)   Sodium 142 135 - 146 mmol/L   Potassium 4.0 3.5 - 5.3 mmol/L   Chloride 102 98 - 110 mmol/L   CO2 29 20 - 32 mmol/L   Calcium 9.6 8.6 - 10.3 mg/dL   Total Protein 6.5 6.1 - 8.1 g/dL   Albumin 4.4 3.6 - 5.1 g/dL   Globulin 2.1 1.9 - 3.7 g/dL (calc)   AG Ratio 2.1 1.0 - 2.5 (calc)   Total Bilirubin 0.8 0.2 - 1.2 mg/dL   Alkaline phosphatase (APISO) 57 35 - 144 U/L   AST 18 10 - 35 U/L   ALT 20 9 - 46 U/L  Lipid panel  Result Value Ref Range   Cholesterol 139 <200 mg/dL   HDL 60 > OR = 40 mg/dL   Triglycerides 73 <150 mg/dL   LDL Cholesterol (Calc) 64 mg/dL (calc)   Total CHOL/HDL Ratio 2.3 <5.0 (calc)   Non-HDL Cholesterol (Calc) 79 <130 mg/dL (calc)    Lab Results  Component Value Date   CREATININE 1.09 06/28/2020    I have reviewed the labs  Assessment & Plan:    1. Erectile dysfunction SHIM score is 12, it is improved  Continue sildenafil - refills given  RTC in one year for SHIM and exam   2 . BPH with LUTS IPSS score is 2/2, it is improved  Continue conservative management, avoiding  bladder irritants and timed voiding's PSA was not drawn prior to appointment and he does not have time to get his PSA today RTC in 1 for PSA and exam   Return in about 1 week (around 07/15/2020) for PSA and exam .  Zara Council, [facility]  [address] [place] [place], [place] [address] [phone]

## 2020-07-08 ENCOUNTER — Encounter: Payer: Self-pay | Admitting: Urology

## 2020-07-08 ENCOUNTER — Other Ambulatory Visit: Payer: Self-pay

## 2020-07-08 ENCOUNTER — Ambulatory Visit (INDEPENDENT_AMBULATORY_CARE_PROVIDER_SITE_OTHER): Payer: BC Managed Care – PPO | Admitting: Urology

## 2020-07-08 VITALS — BP 149/87 | HR 60 | Ht 71.0 in | Wt 268.0 lb

## 2020-07-08 DIAGNOSIS — N401 Enlarged prostate with lower urinary tract symptoms: Secondary | ICD-10-CM

## 2020-07-08 DIAGNOSIS — N529 Male erectile dysfunction, unspecified: Secondary | ICD-10-CM

## 2020-07-08 DIAGNOSIS — N138 Other obstructive and reflux uropathy: Secondary | ICD-10-CM | POA: Diagnosis not present

## 2020-07-08 MED ORDER — SILDENAFIL CITRATE 100 MG PO TABS: 100.0000 mg | ORAL_TABLET | Freq: Every day | ORAL | 3 refills | Status: DC | PRN

## 2020-07-14 NOTE — Progress Notes (Signed)
8:25 AM   Marc Stevens 1960-11-23 [id number]  Referring provider: Delsa Grana, PA-C [address] [place],  [place] [address]  Chief Complaint  Patient presents with  . Follow-up    PSA , exam    HPI: Patient is a 59 year old male with erectile dysfunction, and mild BPH with LUTS who presents today for PSA and DRE as he did not have time to complete these at his visit last week.   ED No changes since his visit last week.   BPH WITH LUTS  (prostate and/or bladder) No changes since his visit last week.  PMH: Past Medical History:  Diagnosis Date  . BPH (benign prostatic hypertrophy)   . Complete rotator cuff tear 08/08/2015  . Diabetes mellitus without complication ([place])   . ED (erectile dysfunction)   . HTN (hypertension) 04/18/2012  . Hyperlipidemia   . Low testosterone   . Morbidly obese ([place])   . OSA (obstructive sleep apnea)    cpap - does not know settings   . Rotator cuff tear    RT  . Torn Achilles tendon 07/11/2015  . Torn Achilles tendon 07/11/2015    Surgical History: Past Surgical History:  Procedure Laterality Date  . COLONOSCOPY WITH PROPOFOL N/A 02/18/2018   Procedure: COLONOSCOPY WITH PROPOFOL;  Surgeon: Jonathon Bellows, MD;  Location: [facility] ENDOSCOPY;  Service: Gastroenterology;  Laterality: N/A;  . DOPPLER ECHOCARDIOGRAPHY  2013  . left kene arthroscopy     . LUMBAR LAMINECTOMY/DECOMPRESSION MICRODISCECTOMY N/A 01/30/2015   Procedure: MICRO LUMBAR DECOMPRESSION, BILATERAL FORAMINOTOMIES, MICRODISCECTOMY LUMBAR FOUR TO LUMBAR FIVE;  Surgeon: Susa Day, MD;  Location: [facility];  Service: Orthopedics;  Laterality: N/A;  . right knee surgery    . ROTATOR CUFF REPAIR  2007   Left  . SHOULDER OPEN ROTATOR CUFF REPAIR Right 08/08/2015   Procedure: RIGHT SHOULDER MINI OPEN ROTATOR CUFF REPAIR SUBACROMIAL DECOMPRESSION ;  Surgeon: Susa Day, MD;  Location: [facility]S;  Service: Orthopedics;  Laterality: Right;    Home Medications:    Allergies as of 07/15/2020   No Known Allergies     Medication List       Accurate as of July 15, 2020  8:25 AM. If you have any questions, ask your nurse or doctor.        amLODipine 2.5 MG tablet Commonly known as: NORVASC TAKE 1 TABLET BY MOUTH EVERY DAY   aspirin EC 81 MG tablet Take 1 tablet (81 mg total) by mouth every morning. Resume 4 days post-op   atorvastatin 10 MG tablet Commonly known as: LIPITOR Take 1 tablet (10 mg total) by mouth at bedtime.   carvedilol 12.5 MG tablet Commonly known as: COREG Take 1 tablet (12.5 mg total) by mouth 2 (two) times daily with a meal.   diclofenac Sodium 1 % Gel Commonly known as: VOLTAREN diclofenac 1 % topical gel  APPLY 2 GRAMS TO THE AFFECTED AREA(S) BY TOPICAL ROUTE 4 TIMES PER DAY   gabapentin 600 MG tablet Commonly known as: Neurontin Take 1 tablet (600 mg total) by mouth 3 (three) times daily.   hydrOXYzine 10 MG tablet Commonly known as: ATARAX/VISTARIL Take 1 tablet (10 mg total) by mouth 3 (three) times daily as needed.   ketoconazole 2 % cream Commonly known as: NIZORAL Apply 1 application topically 2 (two) times daily.   losartan-hydrochlorothiazide 100-25 MG tablet Commonly known as: HYZAAR Take 1 tablet by mouth daily.   metFORMIN 500 MG 24 hr tablet Commonly known  as: GLUCOPHAGE-XR TAKE 2 TABLETS BY MOUTH EVERY DAY WITH BREAKFAST   multivitamin tablet Take 1 tablet by mouth daily.   sildenafil 100 MG tablet Commonly known as: VIAGRA Take 1 tablet (100 mg total) by mouth daily as needed for erectile dysfunction.   VITAMIN D (ERGOCALCIFEROL) PO Take 5,000 Units by mouth daily.       Allergies: No Known Allergies  Family History: Family History  Problem Relation Age of Onset  . Dementia Mother   . Hypertension Mother   . Cancer Father        colon  . Heart disease Father   . Congestive Heart Failure Father   . Hypertension Sister   . Dementia Maternal Grandmother   . Kidney  disease Neg Hx   . Prostate cancer Neg Hx   . COPD Neg Hx   . Diabetes Neg Hx   . Stroke Neg Hx   . Kidney cancer Neg Hx   . Bladder Cancer Neg Hx     Social History:  reports that he quit smoking about 15 years ago. His smoking use included cigarettes. He has a 15.00 pack-year smoking history. He has never used smokeless tobacco. He reports current alcohol use. He reports that he does not use drugs.  ROS: For pertinent review of systems please refer to history of present illness  Physical Exam: Blood pressure 133/78, pulse 64, height 5\' 11"  (1.803 m), weight 266 lb (120.7 kg). Constitutional:  Well nourished. Alert and oriented, No acute distress. HEENT: [place] AT, mask in place.  Trachea midline Cardiovascular: No clubbing, cyanosis, or edema. Respiratory: Normal respiratory effort, no increased work of breathing. GU: No CVA tenderness.  No bladder fullness or masses.  Patient with uncircumcised phallus.   Foreskin easily retracted  Urethral meatus is patent.  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  No penile discharge. No penile lesions or rashes. Scrotum without lesions, cysts, rashes and/or edema.  Testicles are located scrotally bilaterally. No masses are appreciated in the testicles. Left and right epididymis are normal. Rectal: Patient with  normal sphincter tone. Anus and perineum without scarring or rashes. No rectal masses are appreciated. Prostate is approximately 60 + grams, could only palpate the apex, no nodules are appreciated. Seminal vesicles could not be palpated Skin: No rashes, bruises or suspicious lesions. Lymph: No inguinal adenopathy. Neurologic: Grossly intact, no focal deficits, moving all 4 extremities. Psychiatric: Normal mood and affect.   Laboratory Data: PSA Trend  1.8 in 02/2013  1.2 in 02/2016  1.4 in 05/2016  Component     Latest Ref Rng & Units 06/11/2017 07/06/2019  Prostatic Specific Antigen     0.00 - 4.00 ng/mL 1.39 1.12      Results for orders placed or performed in visit on 06/28/20  Hemoglobin A1C  Result Value Ref Range   Hgb A1c MFr Bld 5.8 (H) <5.7 % of total Hgb   Mean Plasma Glucose 120 (calc)   eAG (mmol/L) 6.6 (calc)  COMPLETE METABOLIC PANEL WITH GFR  Result Value Ref Range   Glucose, Bld 96 65 - 99 mg/dL   BUN 14 7 - 25 mg/dL   Creat 1.09 0.70 - 1.33 mg/dL   GFR, Est Non African American 74 > OR = 60 mL/min/1.79m2   GFR, Est African American 86 > OR = 60 mL/min/1.95m2   BUN/Creatinine Ratio NOT APPLICABLE 6 - 22 (calc)   Sodium 142 135 - 146 mmol/L   Potassium 4.0 3.5 -  5.3 mmol/L   Chloride 102 98 - 110 mmol/L   CO2 29 20 - 32 mmol/L   Calcium 9.6 8.6 - 10.3 mg/dL   Total Protein 6.5 6.1 - 8.1 g/dL   Albumin 4.4 3.6 - 5.1 g/dL   Globulin 2.1 1.9 - 3.7 g/dL (calc)   AG Ratio 2.1 1.0 - 2.5 (calc)   Total Bilirubin 0.8 0.2 - 1.2 mg/dL   Alkaline phosphatase (APISO) 57 35 - 144 U/L   AST 18 10 - 35 U/L   ALT 20 9 - 46 U/L  Lipid panel  Result Value Ref Range   Cholesterol 139 <200 mg/dL   HDL 60 > OR = 40 mg/dL   Triglycerides 73 <150 mg/dL   LDL Cholesterol (Calc) 64 mg/dL (calc)   Total CHOL/HDL Ratio 2.3 <5.0 (calc)   Non-HDL Cholesterol (Calc) 79 <130 mg/dL (calc)    Lab Results  Component Value Date   CREATININE 1.09 06/28/2020    I have reviewed the labs  Assessment & Plan:    1. Erectile dysfunction SHIM score is 12, it is improved  Continue sildenafil - refills given  RTC in one year for SHIM and exam   2 . BPH with LUTS IPSS score is 2/2, it is improved  Continue conservative management, avoiding bladder irritants and timed voiding's PSA and exam completed today  RTC in 1 for PSA and exam - if PSA returns within acceptable limits   Return in about 1 year (around 07/15/2021) for IPSS, SHIM, PSA and exam.  Zara Council, [facility]  [place] [place] [address] [place], [place] [address] [phone]

## 2020-07-15 ENCOUNTER — Ambulatory Visit (INDEPENDENT_AMBULATORY_CARE_PROVIDER_SITE_OTHER): Payer: BC Managed Care – PPO | Admitting: Urology

## 2020-07-15 ENCOUNTER — Other Ambulatory Visit
Admission: RE | Admit: 2020-07-15 | Discharge: 2020-07-15 | Disposition: A | Discharge status: Home / Self Care | Payer: BC Managed Care – PPO | Attending: Urology | Admitting: Urology

## 2020-07-15 ENCOUNTER — Other Ambulatory Visit: Payer: Self-pay

## 2020-07-15 ENCOUNTER — Telehealth: Payer: Self-pay | Admitting: Family Medicine

## 2020-07-15 ENCOUNTER — Encounter: Payer: Self-pay | Admitting: Urology

## 2020-07-15 VITALS — BP 133/78 | HR 64 | Ht 71.0 in | Wt 266.0 lb

## 2020-07-15 DIAGNOSIS — N401 Enlarged prostate with lower urinary tract symptoms: Secondary | ICD-10-CM

## 2020-07-15 DIAGNOSIS — N138 Other obstructive and reflux uropathy: Secondary | ICD-10-CM | POA: Diagnosis not present

## 2020-07-15 DIAGNOSIS — N529 Male erectile dysfunction, unspecified: Secondary | ICD-10-CM | POA: Diagnosis not present

## 2020-07-15 LAB — PSA: Prostatic Specific Antigen: 1.1 ng/mL (ref 0.00–4.00)

## 2020-07-15 NOTE — Telephone Encounter (Signed)
Patient notified and voiced understanding.

## 2020-07-15 NOTE — Telephone Encounter (Signed)
-----   Message from Nori Riis, PA-C sent at 07/15/2020  2:01 PM EDT ----- Please let Mr. Everton know that his PSA is normal.

## 2020-07-20 ENCOUNTER — Other Ambulatory Visit: Payer: Self-pay | Admitting: Family Medicine

## 2020-07-20 DIAGNOSIS — I10 Essential (primary) hypertension: Secondary | ICD-10-CM

## 2020-07-23 ENCOUNTER — Other Ambulatory Visit: Payer: Self-pay | Admitting: Family Medicine

## 2020-07-23 DIAGNOSIS — I10 Essential (primary) hypertension: Secondary | ICD-10-CM

## 2020-07-26 ENCOUNTER — Other Ambulatory Visit: Payer: Self-pay | Admitting: Family Medicine

## 2020-07-26 DIAGNOSIS — I10 Essential (primary) hypertension: Secondary | ICD-10-CM

## 2020-09-05 NOTE — Patient Instructions (Addendum)
Health Maintenance  Topic Date Due  . Complete foot exam   Never done  . Eye exam for diabetics  Never done  . Flu Shot  01/16/2021*  . Pneumococcal vaccine  06/28/2021*  . Hemoglobin A1C  12/26/2020  . Colon Cancer Screening  02/19/2023  . Tetanus Vaccine  11/14/2027  . COVID-19 Vaccine  Completed  .  Hepatitis C: One time screening is recommended by Center for Disease Control  (CDC) for  adults born from 1945 through 1965.   Completed  . HIV Screening  Completed  *Topic was postponed. The date shown is not the original due date.      Preventive Care 40-64 Years Old, Male Preventive care refers to lifestyle choices and visits with your health care provider that can promote health and wellness. This includes:  A yearly physical exam. This is also called an annual well check.  Regular dental and eye exams.  Immunizations.  Screening for certain conditions.  Healthy lifestyle choices, such as eating a healthy diet, getting regular exercise, not using drugs or products that contain nicotine and tobacco, and limiting alcohol use. What can I expect for my preventive care visit? Physical exam Your health care provider will check:  Height and weight. These may be used to calculate body mass index (BMI), which is a measurement that tells if you are at a healthy weight.  Heart rate and blood pressure.  Your skin for abnormal spots. Counseling Your health care provider may ask you questions about:  Alcohol, tobacco, and drug use.  Emotional well-being.  Home and relationship well-being.  Sexual activity.  Eating habits.  Work and work environment. What immunizations do I need?  Influenza (flu) vaccine  This is recommended every year. Tetanus, diphtheria, and pertussis (Tdap) vaccine  You may need a Td booster every 10 years. Varicella (chickenpox) vaccine  You may need this vaccine if you have not already been vaccinated. Zoster (shingles) vaccine  You may need  this after age 60. Measles, mumps, and rubella (MMR) vaccine  You may need at least one dose of MMR if you were born in 1957 or later. You may also need a second dose. Pneumococcal conjugate (PCV13) vaccine  You may need this if you have certain conditions and were not previously vaccinated. Pneumococcal polysaccharide (PPSV23) vaccine  You may need one or two doses if you smoke cigarettes or if you have certain conditions. Meningococcal conjugate (MenACWY) vaccine  You may need this if you have certain conditions. Hepatitis A vaccine  You may need this if you have certain conditions or if you travel or work in places where you may be exposed to hepatitis A. Hepatitis B vaccine  You may need this if you have certain conditions or if you travel or work in places where you may be exposed to hepatitis B. Haemophilus influenzae type b (Hib) vaccine  You may need this if you have certain risk factors. Human papillomavirus (HPV) vaccine  If recommended by your health care provider, you may need three doses over 6 months. You may receive vaccines as individual doses or as more than one vaccine together in one shot (combination vaccines). Talk with your health care provider about the risks and benefits of combination vaccines. What tests do I need? Blood tests  Lipid and cholesterol levels. These may be checked every 5 years, or more frequently if you are over 50 years old.  Hepatitis C test.  Hepatitis B test. Screening  Lung cancer screening. You   may have this screening every year starting at age 18 if you have a 30-pack-year history of smoking and currently smoke or have quit within the past 15 years.  Prostate cancer screening. Recommendations will vary depending on your family history and other risks.  Colorectal cancer screening. All adults should have this screening starting at age 60 and continuing until age 41. Your health care provider may recommend screening at age 65 if  you are at increased risk. You will have tests every 1-10 years, depending on your results and the type of screening test.  Diabetes screening. This is done by checking your blood sugar (glucose) after you have not eaten for a while (fasting). You may have this done every 1-3 years.  Sexually transmitted disease (STD) testing. Follow these instructions at home: Eating and drinking  Eat a diet that includes fresh fruits and vegetables, whole grains, lean protein, and low-fat dairy products.  Take vitamin and mineral supplements as recommended by your health care provider.  Do not drink alcohol if your health care provider tells you not to drink.  If you drink alcohol: ? Limit how much you have to 0-2 drinks a day. ? Be aware of how much alcohol is in your drink. In the [place] one drink equals one 12 oz bottle of beer (355 mL), one 5 oz glass of wine (148 mL), or one 1 oz glass of hard liquor (44 mL). Lifestyle  Take daily care of your teeth and gums.  Stay active. Exercise for at least 30 minutes on 5 or more days each week.  Do not use any products that contain nicotine or tobacco, such as cigarettes, e-cigarettes, and chewing tobacco. If you need help quitting, ask your health care provider.  If you are sexually active, practice safe sex. Use a condom or other form of protection to prevent STIs (sexually transmitted infections).  Talk with your health care provider about taking a low-dose aspirin every day starting at age 70. What's next?  Go to your health care provider once a year for a well check visit.  Ask your health care provider how often you should have your eyes and teeth checked.  Stay up to date on all vaccines. This information is not intended to replace advice given to you by your health care provider. Make sure you discuss any questions you have with your health care provider. Document Revised: 09/29/2018 Document Reviewed: 09/29/2018 Elsevier Patient Education   [place].   Prediabetes Prediabetes is the condition of having a blood sugar (blood glucose) level that is higher than it should be, but not high enough for you to be diagnosed with type 2 diabetes. Having prediabetes puts you at risk for developing type 2 diabetes (type 2 diabetes mellitus). Prediabetes may be called impaired glucose tolerance or impaired fasting glucose. Prediabetes usually does not cause symptoms. Your health care provider can diagnose this condition with blood tests. You may be tested for prediabetes if you are overweight and if you have at least one other risk factor for prediabetes. What is blood glucose, and how is it measured? Blood glucose refers to the amount of glucose in your bloodstream. Glucose comes from eating foods that contain sugars and starches (carbohydrates), which the body breaks down into glucose. Your blood glucose level may be measured in mg/dL (milligrams per deciliter) or mmol/L (millimoles per liter). Your blood glucose may be checked with one or more of the following blood tests:  A fasting blood glucose (FBG)  test. You will not be allowed to eat (you will fast) for 8 hours or longer before a blood sample is taken. ? A normal range for FBG is 70-100 mg/dl (3.9-5.6 mmol/L).  An A1c (hemoglobin A1c) blood test. This test provides information about blood glucose control over the previous 2?3months.  An oral glucose tolerance test (OGTT). This test measures your blood glucose at two times: ? After fasting. This is your baseline level. ? Two hours after you drink a beverage that contains glucose. You may be diagnosed with prediabetes:  If your FBG is 100?125 mg/dL (5.6-6.9 mmol/L).  If your A1c level is 5.7?6.4%.  If your OGTT result is 140?199 mg/dL (7.8-11 mmol/L). These blood tests may be repeated to confirm your diagnosis. How can this condition affect me? The pancreas produces a hormone (insulin) that helps to move glucose from the  bloodstream into cells. When cells in the body do not respond properly to insulin that the body makes (insulin resistance), excess glucose builds up in the blood instead of going into cells. As a result, high blood glucose (hyperglycemia) can develop, which can cause many complications. Hyperglycemia is a symptom of prediabetes. Having high blood glucose for a long time is dangerous. Too much glucose in your blood can damage your nerves and blood vessels. Long-term damage can lead to complications from diabetes, which may include:  Heart disease.  Stroke.  Blindness.  Kidney disease.  Depression.  Poor circulation in the feet and legs, which could lead to surgical removal (amputation) in severe cases. What can increase my risk? Risk factors for prediabetes include:  Having a family member with type 2 diabetes.  Being overweight or obese.  Being older than age 45.  Being of American Indian, African-American, Hispanic/Latino, or Asian/Pacific Islander descent.  Having an inactive (sedentary) lifestyle.  Having a history of heart disease.  History of gestational diabetes or polycystic ovary syndrome (PCOS), in women.  Having low levels of good cholesterol (HDL-C) or high levels of blood fats (triglycerides).  Having high blood pressure. What actions can I take to prevent diabetes?      Be physically active. ? Do moderate-intensity physical activity for 30 or more minutes on 5 or more days of the week, or as much as told by your health care provider. This could be brisk walking, biking, or water aerobics. ? Ask your health care provider what activities are safe for you. A mix of physical activities may be best, such as walking, swimming, cycling, and strength training.  Lose weight as told by your health care provider. ? Losing 5-7% of your body weight can reverse insulin resistance. ? Your health care provider can determine how much weight loss is best for you and can help  you lose weight safely.  Follow a healthy meal plan. This includes eating lean proteins, complex carbohydrates, fresh fruits and vegetables, low-fat dairy products, and healthy fats. ? Follow instructions from your health care provider about eating or drinking restrictions. ? Make an appointment to see a diet and nutrition specialist (registered dietitian) to help you create a healthy eating plan that is right for you.  Do not smoke or use any tobacco products, such as cigarettes, chewing tobacco, and e-cigarettes. If you need help quitting, ask your health care provider.  Take over-the-counter and prescription medicines as told by your health care provider. You may be prescribed medicines that help lower the risk of type 2 diabetes.  Keep all follow-up visits as told by your   health care provider. This is important. Summary  Prediabetes is the condition of having a blood sugar (blood glucose) level that is higher than it should be, but not high enough for you to be diagnosed with type 2 diabetes.  Having prediabetes puts you at risk for developing type 2 diabetes (type 2 diabetes mellitus).  To help prevent type 2 diabetes, make lifestyle changes such as being physically active and eating a healthy diet. Lose weight as told by your health care provider. This information is not intended to replace advice given to you by your health care provider. Make sure you discuss any questions you have with your health care provider. Document Revised: 01/27/2019 Document Reviewed: 11/26/2015 Elsevier Patient Education  2020 [employer]  

## 2020-09-05 NOTE — Progress Notes (Signed)
Patient: Marc Stevens, Male    DOB: Dec 19, 1960, 59 y.o.   MRN: [id number] Delsa Grana, PA-C Visit Date: 09/06/2020  Today's Provider: Delsa Grana, PA-C   Chief Complaint  Patient presents with  . Annual Exam   Subjective:   Annual physical exam:  Marc Stevens is a 59 y.o. male who presents today for health maintenance and annual & complete physical exam.   Exercise/Activity:  Not exercising Sleep: Sleeping fairly well obstructive sleep apnea on CPAP  Pt wished to do routine f/up on chronic conditions today in addition to CPE. Advised pt of separate visit billing/coding  Hyperlipidemia on Lipitor 10 mg, good compliance, no myalgias side effects or concerns  Prediabetes versus type 2 diabetes, A1c has always been in prediabetes range from what I can see on the chart but he has also been on Metformin which makes me suspect type 2 diabetes diagnosis.  Due for A1c today Although there is no reading of A1c greater than 6.5 he likely has type 2 diabetes and would be due for foot exam and eye exam  Hypertension with history of LVH and mitral valve insufficiency, no exertional symptoms, blood pressure well controlled today managed on losartan HCTZ 100-25, carvedilol 12.5 mg twice daily and Norvasc.   BP Readings from Last 3 Encounters:  09/06/20 124/80  07/15/20 133/78  07/08/20 (!) 149/87   Pt denies CP, SOB, exertional sx, LE edema, palpitation, Ha's, visual disturbances, lightheadedness, hypotension, syncope.  USPSTF grade A and B recommendations - reviewed and addressed today  Depression:  Phq 9 completed today by patient, was reviewed by me with patient in the room, score is  negative, pt feels that his mood is good PHQ 2/9 Scores 09/06/2020 06/28/2020 12/29/2019 11/16/2019  PHQ - 2 Score 1 0 0 0  PHQ- 9 Score - - 0 0   Depression screen [facility] 2/9 09/06/2020 06/28/2020 12/29/2019 11/16/2019 09/01/2019  Decreased Interest 1 0 0 0 0  Down, Depressed, Hopeless 0 0  0 0 0  PHQ - 2 Score 1 0 0 0 0  Altered sleeping - - 0 0 0  Tired, decreased energy - - 0 0 0  Change in appetite - - 0 0 0  Feeling bad or failure about yourself  - - 0 0 0  Trouble concentrating - - 0 0 0  Moving slowly or fidgety/restless - - 0 0 0  Suicidal thoughts - - 0 0 0  PHQ-9 Score - - 0 0 0  Difficult doing work/chores - - Not difficult at all Not difficult at all -  Some recent data might be hidden    Hep C Screening: done STD testing and prevention (HIV/chl/gon/syphilis): done Intimate partner violence:denies  Prostate cancer:  Prostate cancer screening with PSA: Discussed risks and benefits of PSA testing and provided handout. Pt declined to have PSA drawn today.  No results found for: PSA   Urinary Symptoms:  IPSS Questionnaire (AUA-7):  IPSS    Row Name 09/06/20 8413         International Prostate Symptom Score   How often have you had the sensation of not emptying your bladder? Not at All     How often have you had to urinate less than every two hours? Not at All     How often have you found you stopped and started again several times when you urinated? Less than 1 in 5 times     How often have you found it  difficult to postpone urination? Less than 1 in 5 times     How often have you had a weak urinary stream? Less than 1 in 5 times     How often have you had to strain to start urination? Not at All     How many times did you typically get up at night to urinate? 1 Time     Total IPSS Score 4           Quality of Life due to urinary symptoms   If you were to spend the rest of your life with your urinary condition just the way it is now how would you feel about that? Pleased            Advanced Care Planning:  A voluntary discussion about advance care planning including the explanation and discussion of advance directives.    Health Maintenance  Topic Date Due  . FOOT EXAM  Never done  . OPHTHALMOLOGY EXAM  Never done  . INFLUENZA VACCINE   01/16/2021 (Originally 05/19/2020)  . PNEUMOCOCCAL POLYSACCHARIDE VACCINE AGE 73-64 HIGH RISK  06/28/2021 (Originally 02/11/1964)  . HEMOGLOBIN A1C  12/26/2020  . COLONOSCOPY  02/19/2023  . TETANUS/TDAP  11/14/2027  . COVID-19 Vaccine  Completed  . Hepatitis C Screening  Completed  . HIV Screening  Completed     Skin cancer:  Pt reports no hx of skin cancer, suspicious lesions/biopsies in the past.  Colorectal cancer:  colonoscopy is up-to-date Pt denies melena, hematochezia, change in bowel movements  Lung cancer:   Low Dose CT Chest recommended if Age 22-80 years, 20 pack-year currently smoking OR have quit w/in 15years. Patient does not qualify.   Social History   Tobacco Use  . Smoking status: Former Smoker    Packs/day: 0.50    Years: 30.00    Pack years: 15.00    Types: Cigarettes    Quit date: 10/19/2004    Years since quitting: 15.8  . Smokeless tobacco: Never Used  Substance Use Topics  . Alcohol use: Yes    Alcohol/week: 0.0 standard drinks    Comment: occasional     Alcohol screening:   [facility] 12/29/2019 in [facility]  AUDIT-C Score 0      AAA: n/a per age the USPSTF recommends one-time screening with ultrasonography in men ages 45 to 17 years who have ever smoked  ECG: Done in the past, per cardiology  Blood pressure/Hypertension: BP Readings from Last 3 Encounters:  09/06/20 124/80  07/15/20 133/78  07/08/20 (!) 149/87   Weight/Obesity: Wt Readings from Last 3 Encounters:  09/06/20 263 lb 9.6 oz (119.6 kg)  07/15/20 266 lb (120.7 kg)  07/08/20 268 lb (121.6 kg)   BMI Readings from Last 3 Encounters:  09/06/20 38.09 kg/m  07/15/20 37.10 kg/m  07/08/20 37.38 kg/m    Lipids:  Lab Results  Component Value Date   CHOL 139 06/28/2020   CHOL 133 12/29/2019   CHOL 99 05/30/2019   Lab Results  Component Value Date   HDL 60 06/28/2020   HDL 42 12/29/2019   HDL 41 05/30/2019   Lab Results  Component Value Date    LDLCALC 64 06/28/2020   LDLCALC 70 12/29/2019   LDLCALC 43 05/30/2019   Lab Results  Component Value Date   TRIG 73 06/28/2020   TRIG 129 12/29/2019   TRIG 72 05/30/2019   Lab Results  Component Value Date   CHOLHDL 2.3 06/28/2020   CHOLHDL  3.2 12/29/2019   CHOLHDL 2.4 05/30/2019   No results found for: LDLDIRECT Based on the results of lipid panel his/her cardiovascular risk factor ( using [facility] )  in the next 10 years is : The 10-year ASCVD risk score Mikey Bussing DC Brooke Bonito., et al., 2013) is: 18.2%   Values used to calculate the score:     Age: 51 years     Sex: Male     Is Non-Hispanic African American: Yes     Diabetic: Yes     Tobacco smoker: No     Systolic Blood Pressure: 785 mmHg     Is BP treated: Yes     HDL Cholesterol: 60 mg/dL     Total Cholesterol: 139 mg/dL Glucose:  Glucose  Date Value Ref Range Status  04/04/2014 96 65 - 99 mg/dL Final   Glucose, Bld  Date Value Ref Range Status  06/28/2020 96 65 - 99 mg/dL Final    Comment:    .            Fasting reference interval .   12/29/2019 95 65 - 99 mg/dL Final    Comment:    .            Fasting reference interval .   05/30/2019 106 (H) 65 - 99 mg/dL Final    Comment:    .            Fasting reference interval . For someone without known diabetes, a glucose value between 100 and 125 mg/dL is consistent with prediabetes and should be confirmed with a follow-up test. .     Social History      He  reports that he quit smoking about 15 years ago. His smoking use included cigarettes. He has a 15.00 pack-year smoking history. He has never used smokeless tobacco. He reports current alcohol use. He reports that he does not use drugs.       Social History   Socioeconomic History  . Marital status: Single    Spouse name: Not on file  . Number of children: Not on file  . Years of education: Not on file  . Highest education level: Not on file  Occupational History    Employer: [employer]    Employer: [employer]  Tobacco Use  . Smoking status: Former Smoker    Packs/day: 0.50    Years: 30.00    Pack years: 15.00    Types: Cigarettes    Quit date: 10/19/2004    Years since quitting: 15.8  . Smokeless tobacco: Never Used  Vaping Use  . Vaping Use: Never used  Substance and Sexual Activity  . Alcohol use: Yes    Alcohol/week: 0.0 standard drinks    Comment: occasional  . Drug use: No  . Sexual activity: Yes    Birth control/protection: None  Other Topics Concern  . Not on file  Social History Narrative  . Not on file   Social Determinants of Health   Financial Resource Strain: Low Risk   . Difficulty of Paying Living Expenses: Not hard at all  Food Insecurity: No Food Insecurity  . Worried About Charity fundraiser in the Last Year: Never true  . Ran Out of Food in the Last Year: Never true  Transportation Needs: No Transportation Needs  . Lack of Transportation (Medical): No  . Lack of Transportation (Non-Medical): No  Physical Activity: Inactive  . Days of Exercise per Week: 0 days  . Minutes  of Exercise per Session: 0 min  Stress: No Stress Concern Present  . Feeling of Stress : Only a little  Social Connections: Socially Isolated  . Frequency of Communication with Friends and Family: More than three times a week  . Frequency of Social Gatherings with Friends and Family: Three times a week  . Attends Religious Services: Never  . Active Member of Clubs or Organizations: No  . Attends Archivist Meetings: Never  . Marital Status: Divorced     Family History        Family Status  Relation Name Status  . Mother  Deceased       dementia  . Father  Deceased       colon cancer  . Sister 1 Alive  . MGM  Deceased       dementia  . MGF  Deceased       unknown  . PGM  Deceased       unknown  . PGF  Deceased       unknown  . Neg Hx  (Not Specified)        His family history includes Cancer in his father; Congestive Heart Failure in his father; Dementia in  his maternal grandmother and mother; Heart disease in his father; Hypertension in his mother and sister. There is no history of Kidney disease, Prostate cancer, COPD, Diabetes, Stroke, Kidney cancer, or Bladder Cancer.       Family History  Problem Relation Age of Onset  . Dementia Mother   . Hypertension Mother   . Cancer Father        colon  . Heart disease Father   . Congestive Heart Failure Father   . Hypertension Sister   . Dementia Maternal Grandmother   . Kidney disease Neg Hx   . Prostate cancer Neg Hx   . COPD Neg Hx   . Diabetes Neg Hx   . Stroke Neg Hx   . Kidney cancer Neg Hx   . Bladder Cancer Neg Hx     Patient Active Problem List   Diagnosis Date Noted  . Benign essential hypertension 11/07/2019  . Proteinuria 11/07/2019  . Type 2 diabetes mellitus with other diabetic kidney complication ([place]) 50/93/2671  . Lymphedema 06/16/2018  . 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 Prediabetes 08/13/2017  . Mitral regurgitation 01/17/2016  . LVH (left ventricular hypertrophy) 01/01/2016  . Low testosterone 05/14/2015  . 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 Vitamin D deficiency 04/26/2015  . Beta thalassemia minor 04/26/2015  . Class 2 severe obesity with serious comorbidity and body mass index (BMI) of 37.0 to 37.9 in adult [facility])   . ED (erectile dysfunction)   . Hyperlipidemia   . OSA (obstructive sleep apnea)   . HTN (hypertension) 04/18/2012    Past Surgical History:  Procedure Laterality Date  . COLONOSCOPY WITH PROPOFOL N/A 02/18/2018   Procedure: COLONOSCOPY WITH PROPOFOL;  Surgeon: Jonathon Bellows, MD;  Location: [facility] ENDOSCOPY;  Service: Gastroenterology;  Laterality: N/A;  . DOPPLER ECHOCARDIOGRAPHY  2013  . left kene arthroscopy     . LUMBAR LAMINECTOMY/DECOMPRESSION MICRODISCECTOMY N/A 01/30/2015   Procedure: MICRO LUMBAR DECOMPRESSION, BILATERAL FORAMINOTOMIES, MICRODISCECTOMY LUMBAR FOUR TO LUMBAR FIVE;  Surgeon: Susa Day, MD;   Location: [facility] ORS;  Service: Orthopedics;  Laterality: N/A;  . right knee surgery    . ROTATOR CUFF REPAIR  2007   Left  . SHOULDER OPEN ROTATOR CUFF REPAIR Right 08/08/2015   Procedure: RIGHT  SHOULDER MINI OPEN ROTATOR CUFF REPAIR SUBACROMIAL DECOMPRESSION ;  Surgeon: Susa Day, MD;  Location: [facility] ORS;  Service: Orthopedics;  Laterality: Right;     Current Outpatient Medications:  .  amLODipine (NORVASC) 2.5 MG tablet, TAKE 1 TABLET BY MOUTH EVERY DAY, Disp: 90 tablet, Rfl: 3 .  aspirin EC 81 MG tablet, Take 1 tablet (81 mg total) by mouth every morning. Resume 4 days post-op, Disp: , Rfl:  .  atorvastatin (LIPITOR) 10 MG tablet, Take 1 tablet (10 mg total) by mouth at bedtime., Disp: 90 tablet, Rfl: 3 .  carvedilol (COREG) 12.5 MG tablet, Take 1 tablet (12.5 mg total) by mouth 2 (two) times daily with a meal., Disp: 180 tablet, Rfl: 3 .  diclofenac Sodium (VOLTAREN) 1 % GEL, diclofenac 1 % topical gel  APPLY 2 GRAMS TO THE AFFECTED AREA(S) BY TOPICAL ROUTE 4 TIMES PER DAY, Disp: , Rfl:  .  gabapentin (NEURONTIN) 600 MG tablet, Take 1 tablet (600 mg total) by mouth 3 (three) times daily., Disp: 270 tablet, Rfl: 3 .  ketoconazole (NIZORAL) 2 % cream, Apply 1 application topically 2 (two) times daily., Disp: 45 g, Rfl: 3 .  losartan-hydrochlorothiazide (HYZAAR) 100-25 MG tablet, Take 1 tablet by mouth daily., Disp: 90 tablet, Rfl: 3 .  metFORMIN (GLUCOPHAGE-XR) 500 MG 24 hr tablet, TAKE 2 TABLETS BY MOUTH EVERY DAY WITH BREAKFAST, Disp: 180 tablet, Rfl: 3 .  Multiple Vitamin (MULTIVITAMIN) tablet, Take 1 tablet by mouth daily., Disp: , Rfl:  .  sildenafil (VIAGRA) 100 MG tablet, Take 1 tablet (100 mg total) by mouth daily as needed for erectile dysfunction., Disp: 30 tablet, Rfl: 3 .  VITAMIN D, ERGOCALCIFEROL, PO, Take 5,000 Units by mouth daily., Disp: , Rfl:   No Known Allergies  Patient Care Team: Delsa Grana, PA-C as PCP - General (Family Medicine) Wellington Hampshire, MD as  Consulting Physician (Cardiology) Lavonia Dana, MD as Consulting Physician (Internal Medicine)   Chart Review: I personally reviewed active problem list, medication list, allergies, family history, social history, health maintenance, notes from last encounter, lab results, imaging with the patient/caregiver today.   Review of Systems  10 Systems reviewed and are negative for acute change except as noted in the HPI.       Objective:   Vitals:  Vitals:   09/06/20 0802  BP: 124/80  Pulse: 68  Resp: 16  Temp: 97.6 F (36.4 C)  TempSrc: Oral  SpO2: 96%  Weight: 263 lb 9.6 oz (119.6 kg)  Height: 5' 9.75" (1.772 m)    Body mass index is 38.09 kg/m.  Physical Exam Vitals and nursing note reviewed.  Constitutional:      General: He is not in acute distress.    Appearance: Normal appearance. He is well-developed and well-nourished. He is obese. He is not ill-appearing, toxic-appearing or diaphoretic.     Interventions: Face mask in place.  HENT:     Head: Normocephalic and atraumatic.     Jaw: No trismus.     Right Ear: Tympanic membrane, ear canal and external ear normal.     Left Ear: Tympanic membrane, ear canal and external ear normal.     Nose: Nose normal. No mucosal edema, congestion or rhinorrhea.     Right Sinus: No maxillary sinus tenderness or frontal sinus tenderness.     Left Sinus: No maxillary sinus tenderness or frontal sinus tenderness.     Mouth/Throat:     Mouth: Mucous membranes are moist.  Pharynx: Uvula midline. No oropharyngeal exudate, posterior oropharyngeal edema, posterior oropharyngeal erythema or uvula swelling.  Eyes:     General: Lids are normal. No scleral icterus.       Right eye: No discharge.        Left eye: No discharge.     Extraocular Movements: EOM normal.     Conjunctiva/sclera: Conjunctivae normal.     Pupils: Pupils are equal, round, and reactive to light.  Neck:     Trachea: Trachea and phonation normal. No tracheal  deviation.  Cardiovascular:     Rate and Rhythm: Normal rate and regular rhythm.     Pulses: Normal pulses.          Radial pulses are 2+ on the right side and 2+ on the left side.       Posterior tibial pulses are 2+ on the right side and 2+ on the left side.     Heart sounds: Normal heart sounds. No murmur heard. No friction rub. No gallop.   Pulmonary:     Effort: Pulmonary effort is normal. No respiratory distress.     Breath sounds: Normal breath sounds. No stridor. No wheezing, rhonchi or rales.  Abdominal:     General: Bowel sounds are normal. There is no distension.     Palpations: Abdomen is soft.     Tenderness: There is no abdominal tenderness. There is no guarding or rebound.  Musculoskeletal:        General: No edema. Normal range of motion.     Cervical back: Normal range of motion and neck supple.     Right lower leg: No edema.     Left lower leg: No edema.  Lymphadenopathy:     Cervical: No cervical adenopathy.  Skin:    General: Skin is warm, dry and intact.     Capillary Refill: Capillary refill takes less than 2 seconds.     Coloration: Skin is not jaundiced or pale.     Findings: No rash.     Nails: There is no clubbing.  Neurological:     Mental Status: He is alert and oriented to person, place, and time. Mental status is at baseline.     Cranial Nerves: No dysarthria or facial asymmetry.     Motor: No weakness, tremor or abnormal muscle tone.     Coordination: Coordination normal.     Gait: Gait normal.  Psychiatric:        Mood and Affect: Mood and affect and mood normal.        Speech: Speech normal.        Behavior: Behavior normal. Behavior is cooperative.      Recent Results (from the past 2160 hour(s))  Hemoglobin A1C     Status: Abnormal   Collection Time: 06/28/20 11:53 AM  Result Value Ref Range   Hgb A1c MFr Bld 5.8 (H) <5.7 % of total Hgb    Comment: For someone without known diabetes, a hemoglobin  A1c value between 5.7% and 6.4% is  consistent with prediabetes and should be confirmed with a  follow-up test. . For someone with known diabetes, a value <7% indicates that their diabetes is well controlled. A1c targets should be individualized based on duration of diabetes, age, comorbid conditions, and other considerations. . This assay result is consistent with an increased risk of diabetes. . Currently, no consensus exists regarding use of hemoglobin A1c for diagnosis of diabetes for children. .    Mean Plasma Glucose 120 (  calc)   eAG (mmol/L) 6.6 (calc)  COMPLETE METABOLIC PANEL WITH GFR     Status: None   Collection Time: 06/28/20 11:53 AM  Result Value Ref Range   Glucose, Bld 96 65 - 99 mg/dL    Comment: .            Fasting reference interval .    BUN 14 7 - 25 mg/dL   Creat 1.09 0.70 - 1.33 mg/dL    Comment: For patients >92 years of age, the reference limit for Creatinine is approximately 13% higher for people identified as African-American. .    GFR, Est Non African American 74 > OR = 60 mL/min/1.26m2   GFR, Est African American 86 > OR = 60 mL/min/1.68m2   BUN/Creatinine Ratio NOT APPLICABLE 6 - 22 (calc)   Sodium 142 135 - 146 mmol/L   Potassium 4.0 3.5 - 5.3 mmol/L   Chloride 102 98 - 110 mmol/L   CO2 29 20 - 32 mmol/L   Calcium 9.6 8.6 - 10.3 mg/dL   Total Protein 6.5 6.1 - 8.1 g/dL   Albumin 4.4 3.6 - 5.1 g/dL   Globulin 2.1 1.9 - 3.7 g/dL (calc)   AG Ratio 2.1 1.0 - 2.5 (calc)   Total Bilirubin 0.8 0.2 - 1.2 mg/dL   Alkaline phosphatase (APISO) 57 35 - 144 U/L   AST 18 10 - 35 U/L   ALT 20 9 - 46 U/L  Lipid panel     Status: None   Collection Time: 06/28/20 11:53 AM  Result Value Ref Range   Cholesterol 139 <200 mg/dL   HDL 60 > OR = 40 mg/dL   Triglycerides 73 <150 mg/dL   LDL Cholesterol (Calc) 64 mg/dL (calc)    Comment: Reference range: <100 . Desirable range <100 mg/dL for primary prevention;   <70 mg/dL for patients with CHD or diabetic patients  with > or = 2 CHD  risk factors. Marland Kitchen LDL-C is now calculated using the Martin-Hopkins  calculation, which is a validated novel method providing  better accuracy than the Friedewald equation in the  estimation of LDL-C.  Cresenciano Genre et al. Annamaria Helling. 3818;299(37): 2061-2068  (http://education.[facility].com/faq/FAQ164)    Total CHOL/HDL Ratio 2.3 <5.0 (calc)   Non-HDL Cholesterol (Calc) 79 <130 mg/dL (calc)    Comment: For patients with diabetes plus 1 major ASCVD risk  factor, treating to a non-HDL-C goal of <100 mg/dL  (LDL-C of <70 mg/dL) is considered a therapeutic  option.   PSA     Status: None   Collection Time: 07/15/20  8:09 AM  Result Value Ref Range   Prostatic Specific Antigen 1.10 0.00 - 4.00 ng/mL    Comment: (NOTE) While PSA levels of <=4.0 ng/ml are reported as reference range, some men with levels below 4.0 ng/ml can have prostate cancer and many men with PSA above 4.0 ng/ml do not have prostate cancer.  Other tests such as free PSA, age specific reference ranges, PSA velocity and PSA doubling time may be helpful especially in men less than 49 years old. Performed at [facility], [place] [address] [place], [place] [address]      Fall Risk: Fall Risk  09/06/2020 06/28/2020 12/29/2019 11/16/2019 09/01/2019  Falls in the past year? 0 0 0 0 0  Number falls in past yr: 0 0 0 0 0  Injury with Fall? 0 0 0 0 0  Follow up - Falls evaluation completed - Falls evaluation completed -  Functional Status Survey: Is the patient deaf or have difficulty hearing?: No Does the patient have difficulty seeing, even when wearing glasses/contacts?: No Does the patient have difficulty concentrating, remembering, or making decisions?: Yes Does the patient have difficulty walking or climbing stairs?: No Does the patient have difficulty dressing or bathing?: No Does the patient have difficulty doing errands alone such as visiting a doctor's office or shopping?: No   Assessment & Plan:    CPE  completed today  . Per urology - prostate cancer screening and PSA options (with potential risks and benefits of testing vs not testing) were discussed along with recent recs/guidelines, shared decision making and handout/information given to pt today  . USPSTF grade A and B recommendations reviewed with patient; age-appropriate recommendations, preventive care, screening tests, etc discussed and encouraged; healthy living encouraged; see AVS for patient education given to patient  . Discussed importance of 150 minutes of physical activity weekly, AHA exercise recommendations given to pt in AVS/handout  . Discussed importance of healthy diet:  eating lean meats and proteins, avoiding trans fats and saturated fats, avoid simple sugars and excessive carbs in diet, eat 6 servings of fruit/vegetables daily and drink plenty of water and avoid sweet beverages.  DASH diet reviewed if pt has HTN  . Recommended pt to do annual eye exam and routine dental exams/cleanings  . Reviewed Health Maintenance: Health Maintenance  Topic Date Due  . FOOT EXAM  Never done  . OPHTHALMOLOGY EXAM  Never done  . INFLUENZA VACCINE  01/16/2021 (Originally 05/19/2020)  . PNEUMOCOCCAL POLYSACCHARIDE VACCINE AGE 4-64 HIGH RISK  06/28/2021 (Originally 02/11/1964)  . HEMOGLOBIN A1C  12/26/2020  . COLONOSCOPY  02/19/2023  . TETANUS/TDAP  11/14/2027  . COVID-19 Vaccine  Completed  . Hepatitis C Screening  Completed  . HIV Screening  Completed    . Immunizations: Immunization History  Administered Date(s) Administered  . Moderna SARS-COVID-2 Vaccination 02/29/2020, 03/28/2020  . Td 03/22/2006  . Tdap 08/13/2017, 11/13/2017     ICD-10-CM   1. Adult general medical exam  Z00.00 CBC with Differential/Platelet    COMPLETE METABOLIC PANEL WITH GFR    Lipid panel    Hemoglobin A1c  2. Primary hypertension  K93 COMPLETE METABOLIC PANEL WITH GFR   Stable, well-controlled, blood pressure at goal today, continue Norvasc 2.5  mg daily, Coreg 12.5 mg twice daily and losartan hydrochlorothiazide 100-25 mg daily  3. Class 2 severe obesity due to excess calories with serious comorbidity and body mass index (BMI) of 37.0 to 37.9 in adult ([facility])  O67.12 COMPLETE METABOLIC PANEL WITH GFR   Z68.37 Lipid panel    Hemoglobin A1c   Encouraged to work on [employer], nutrition, lifestyle and increased physical activity  4. Mixed hyperlipidemia  W58.0 COMPLETE METABOLIC PANEL WITH GFR    Lipid panel   Compliant with statin, on Lipitor 10 mg, no side effects or concerns, due for lipids  5. OSA (obstructive sleep apnea)  G47.33 CBC with Differential/Platelet    COMPLETE METABOLIC PANEL WITH GFR  6. Beta thalassemia minor  D56.3 CBC with Differential/Platelet   Known microcytosis without anemia  7. BPH with obstruction/lower urinary tract symptoms  N40.1    N13.8    Stable lower urinary tract symptoms managed by urology  8. Mitral valve insufficiency, unspecified etiology  I34.0    Monitoring, no concerning symptoms  9. Type 2 diabetes mellitus with other diabetic kidney complication (HCC)  D98.33 COMPLETE METABOLIC PANEL WITH GFR    Lipid  panel    Hemoglobin A1c   Well-controlled diabetes on Metformin  10. LVH (left ventricular hypertrophy)  I51.7    Managed by cardiology  11. Low testosterone  R79.89    Follow-up with urology  12. Medication monitoring encounter  Z51.81 CBC with Differential/Platelet    COMPLETE METABOLIC PANEL WITH GFR    Lipid panel    Hemoglobin A1c     Delsa Grana, PA-C 09/06/20 8:41 AM  [facility]

## 2020-09-06 ENCOUNTER — Encounter: Payer: Self-pay | Admitting: Family Medicine

## 2020-09-06 ENCOUNTER — Ambulatory Visit (INDEPENDENT_AMBULATORY_CARE_PROVIDER_SITE_OTHER): Payer: BC Managed Care – PPO | Admitting: Family Medicine

## 2020-09-06 ENCOUNTER — Other Ambulatory Visit: Payer: Self-pay

## 2020-09-06 VITALS — BP 124/80 | HR 68 | Temp 97.6°F | Resp 16 | Ht 69.75 in | Wt 263.6 lb

## 2020-09-06 DIAGNOSIS — I340 Nonrheumatic mitral (valve) insufficiency: Secondary | ICD-10-CM

## 2020-09-06 DIAGNOSIS — Z6837 Body mass index (BMI) 37.0-37.9, adult: Secondary | ICD-10-CM

## 2020-09-06 DIAGNOSIS — I10 Essential (primary) hypertension: Secondary | ICD-10-CM

## 2020-09-06 DIAGNOSIS — Z0000 Encounter for general adult medical examination without abnormal findings: Secondary | ICD-10-CM | POA: Diagnosis not present

## 2020-09-06 DIAGNOSIS — N401 Enlarged prostate with lower urinary tract symptoms: Secondary | ICD-10-CM

## 2020-09-06 DIAGNOSIS — E1129 Type 2 diabetes mellitus with other diabetic kidney complication: Secondary | ICD-10-CM

## 2020-09-06 DIAGNOSIS — N138 Other obstructive and reflux uropathy: Secondary | ICD-10-CM

## 2020-09-06 DIAGNOSIS — D563 Thalassemia minor: Secondary | ICD-10-CM

## 2020-09-06 DIAGNOSIS — Z5181 Encounter for therapeutic drug level monitoring: Secondary | ICD-10-CM

## 2020-09-06 DIAGNOSIS — E782 Mixed hyperlipidemia: Secondary | ICD-10-CM | POA: Diagnosis not present

## 2020-09-06 DIAGNOSIS — G4733 Obstructive sleep apnea (adult) (pediatric): Secondary | ICD-10-CM

## 2020-09-06 DIAGNOSIS — I517 Cardiomegaly: Secondary | ICD-10-CM

## 2020-09-06 DIAGNOSIS — R7989 Other specified abnormal findings of blood chemistry: Secondary | ICD-10-CM

## 2020-09-07 LAB — COMPLETE METABOLIC PANEL WITH GFR
AG Ratio: 1.9 (calc) (ref 1.0–2.5)
ALT: 26 U/L (ref 9–46)
AST: 26 U/L (ref 10–35)
Albumin: 4.3 g/dL (ref 3.6–5.1)
Alkaline phosphatase (APISO): 66 U/L (ref 35–144)
BUN: 20 mg/dL (ref 7–25)
CO2: 30 mmol/L (ref 20–32)
Calcium: 9.7 mg/dL (ref 8.6–10.3)
Chloride: 104 mmol/L (ref 98–110)
Creat: 1.16 mg/dL (ref 0.70–1.33)
GFR, Est African American: 80 mL/min/{1.73_m2} (ref >=60)
GFR, Est Non African American: 69 mL/min/{1.73_m2} (ref >=60)
Globulin: 2.3 g/dL (calc) (ref 1.9–3.7)
Glucose, Bld: 111 mg/dL — ABNORMAL HIGH (ref 65–99)
Potassium: 4 mmol/L (ref 3.5–5.3)
Sodium: 143 mmol/L (ref 135–146)
Total Bilirubin: 0.4 mg/dL (ref 0.2–1.2)
Total Protein: 6.6 g/dL (ref 6.1–8.1)

## 2020-09-07 LAB — HEMOGLOBIN A1C
Hgb A1c MFr Bld: 5.9 % of total Hgb — ABNORMAL HIGH (ref <5.7)
Mean Plasma Glucose: 123 (calc)
eAG (mmol/L): 6.8 (calc)

## 2020-09-07 LAB — CBC WITH DIFFERENTIAL/PLATELET
Absolute Monocytes: 598 cells/uL (ref 200–950)
Basophils Absolute: 51 cells/uL (ref 0–200)
Basophils Relative: 1.1 %
Eosinophils Absolute: 230 cells/uL (ref 15–500)
Eosinophils Relative: 5 %
HCT: 47.6 % (ref 38.5–50.0)
Hemoglobin: 15.1 g/dL (ref 13.2–17.1)
Lymphs Abs: 1601 cells/uL (ref 850–3900)
MCH: 20.7 pg — ABNORMAL LOW (ref 27.0–33.0)
MCHC: 31.7 g/dL — ABNORMAL LOW (ref 32.0–36.0)
MCV: 65.4 fL — ABNORMAL LOW (ref 80.0–100.0)
MPV: 9.4 fL (ref 7.5–12.5)
Monocytes Relative: 13 %
Neutro Abs: 2121 cells/uL (ref 1500–7800)
Neutrophils Relative %: 46.1 %
Platelets: 287 10*3/uL (ref 140–400)
RBC: 7.28 10*6/uL — ABNORMAL HIGH (ref 4.20–5.80)
RDW: 18.4 % — ABNORMAL HIGH (ref 11.0–15.0)
Total Lymphocyte: 34.8 %
WBC: 4.6 10*3/uL (ref 3.8–10.8)

## 2020-09-07 LAB — LIPID PANEL
Cholesterol: 141 mg/dL (ref <200)
HDL: 58 mg/dL (ref >=40)
LDL Cholesterol (Calc): 65 mg/dL (calc)
Non-HDL Cholesterol (Calc): 83 mg/dL (calc) (ref <130)
Total CHOL/HDL Ratio: 2.4 (calc) (ref <5.0)
Triglycerides: 92 mg/dL (ref <150)

## 2020-09-13 ENCOUNTER — Encounter: Payer: Self-pay | Admitting: Family Medicine

## 2020-09-26 ENCOUNTER — Encounter: Payer: Self-pay | Admitting: Family Medicine

## 2020-12-27 ENCOUNTER — Ambulatory Visit: Payer: BC Managed Care – PPO | Admitting: Family Medicine

## 2021-01-07 ENCOUNTER — Other Ambulatory Visit: Payer: Self-pay | Admitting: Family Medicine

## 2021-01-07 DIAGNOSIS — R21 Rash and other nonspecific skin eruption: Secondary | ICD-10-CM

## 2021-01-20 ENCOUNTER — Other Ambulatory Visit: Payer: Self-pay

## 2021-01-20 ENCOUNTER — Emergency Department (HOSPITAL_COMMUNITY): Payer: BC Managed Care – PPO

## 2021-01-20 ENCOUNTER — Encounter (HOSPITAL_COMMUNITY): Payer: Self-pay

## 2021-01-20 ENCOUNTER — Emergency Department (HOSPITAL_COMMUNITY)
Admission: EM | Admit: 2021-01-20 | Discharge: 2021-01-20 | Disposition: A | Discharge status: Home / Self Care | Payer: BC Managed Care – PPO | Attending: Emergency Medicine | Admitting: Emergency Medicine

## 2021-01-20 DIAGNOSIS — Z7984 Long term (current) use of oral hypoglycemic drugs: Secondary | ICD-10-CM | POA: Insufficient documentation

## 2021-01-20 DIAGNOSIS — I10 Essential (primary) hypertension: Secondary | ICD-10-CM | POA: Diagnosis not present

## 2021-01-20 DIAGNOSIS — Z79899 Other long term (current) drug therapy: Secondary | ICD-10-CM | POA: Diagnosis not present

## 2021-01-20 DIAGNOSIS — Z87891 Personal history of nicotine dependence: Secondary | ICD-10-CM | POA: Diagnosis not present

## 2021-01-20 DIAGNOSIS — R072 Precordial pain: Secondary | ICD-10-CM | POA: Insufficient documentation

## 2021-01-20 DIAGNOSIS — R1013 Epigastric pain: Secondary | ICD-10-CM | POA: Insufficient documentation

## 2021-01-20 DIAGNOSIS — Z7982 Long term (current) use of aspirin: Secondary | ICD-10-CM | POA: Insufficient documentation

## 2021-01-20 DIAGNOSIS — E1129 Type 2 diabetes mellitus with other diabetic kidney complication: Secondary | ICD-10-CM | POA: Insufficient documentation

## 2021-01-20 LAB — CBC WITH DIFFERENTIAL/PLATELET
Abs Immature Granulocytes: 0.02 10*3/uL (ref 0.00–0.07)
Basophils Absolute: 0.1 10*3/uL (ref 0.0–0.1)
Basophils Relative: 1 %
Eosinophils Absolute: 0.2 10*3/uL (ref 0.0–0.5)
Eosinophils Relative: 4 %
HCT: 50.4 % (ref 39.0–52.0)
Hemoglobin: 15.9 g/dL (ref 13.0–17.0)
Immature Granulocytes: 0 %
Lymphocytes Relative: 29 %
Lymphs Abs: 1.5 10*3/uL (ref 0.7–4.0)
MCH: 20.8 pg — ABNORMAL LOW (ref 26.0–34.0)
MCHC: 31.5 g/dL (ref 30.0–36.0)
MCV: 66 fL — ABNORMAL LOW (ref 80.0–100.0)
Monocytes Absolute: 0.5 10*3/uL (ref 0.1–1.0)
Monocytes Relative: 10 %
Neutro Abs: 2.9 10*3/uL (ref 1.7–7.7)
Neutrophils Relative %: 56 %
Platelets: 267 10*3/uL (ref 150–400)
RBC: 7.64 MIL/uL — ABNORMAL HIGH (ref 4.22–5.81)
RDW: 18.8 % — ABNORMAL HIGH (ref 11.5–15.5)
WBC: 5.2 10*3/uL (ref 4.0–10.5)
nRBC: 0 % (ref 0.0–0.2)

## 2021-01-20 LAB — HEPATIC FUNCTION PANEL
ALT: 26 U/L (ref 0–44)
AST: 30 U/L (ref 15–41)
Albumin: 3.9 g/dL (ref 3.5–5.0)
Alkaline Phosphatase: 77 U/L (ref 38–126)
Bilirubin, Direct: 0.2 mg/dL (ref 0.0–0.2)
Indirect Bilirubin: 0.8 mg/dL (ref 0.3–0.9)
Total Bilirubin: 1 mg/dL (ref 0.3–1.2)
Total Protein: 6.8 g/dL (ref 6.5–8.1)

## 2021-01-20 LAB — BASIC METABOLIC PANEL
Anion gap: 10 (ref 5–15)
BUN: 26 mg/dL — ABNORMAL HIGH (ref 6–20)
CO2: 26 mmol/L (ref 22–32)
Calcium: 9.2 mg/dL (ref 8.9–10.3)
Chloride: 105 mmol/L (ref 98–111)
Creatinine, Ser: 1.09 mg/dL (ref 0.61–1.24)
GFR, Estimated: >60 mL/min (ref >60)
Glucose, Bld: 163 mg/dL — ABNORMAL HIGH (ref 70–99)
Potassium: 3.7 mmol/L (ref 3.5–5.1)
Sodium: 141 mmol/L (ref 135–145)

## 2021-01-20 LAB — URINALYSIS, ROUTINE W REFLEX MICROSCOPIC
Bacteria, UA: NONE SEEN
Bilirubin Urine: NEGATIVE
Glucose, UA: 50 mg/dL — AB
Hgb urine dipstick: NEGATIVE
Ketones, ur: NEGATIVE mg/dL
Nitrite: NEGATIVE
Protein, ur: NEGATIVE mg/dL
Specific Gravity, Urine: 1.02 (ref 1.005–1.030)
pH: 5 (ref 5.0–8.0)

## 2021-01-20 LAB — LIPASE, BLOOD: Lipase: 28 U/L (ref 11–51)

## 2021-01-20 LAB — TROPONIN I (HIGH SENSITIVITY)
Troponin I (High Sensitivity): 5 ng/L (ref <18)
Troponin I (High Sensitivity): 4 ng/L (ref <18)

## 2021-01-20 LAB — D-DIMER, QUANTITATIVE: D-Dimer, Quant: 0.38 ug/mL-FEU (ref 0.00–0.50)

## 2021-01-20 MED ORDER — LIDOCAINE VISCOUS HCL 2 % MT SOLN
15.0000 mL | Freq: Once | OROMUCOSAL | Status: AC
  Administered 2021-01-20: 15 mL via ORAL
  Filled 2021-01-20: qty 15

## 2021-01-20 MED ORDER — FENTANYL CITRATE (PF) 100 MCG/2ML IJ SOLN
50.0000 ug | Freq: Once | INTRAMUSCULAR | Status: AC
  Administered 2021-01-20: 50 ug via INTRAVENOUS
  Filled 2021-01-20: qty 2

## 2021-01-20 MED ORDER — SODIUM CHLORIDE 0.9 % IV BOLUS
1000.0000 mL | Freq: Once | INTRAVENOUS | Status: AC
  Administered 2021-01-20: 1000 mL via INTRAVENOUS

## 2021-01-20 MED ORDER — SUCRALFATE 1 G PO TABS: 1.0000 g | ORAL_TABLET | Freq: Three times a day (TID) | ORAL | 0 refills | Status: DC

## 2021-01-20 MED ORDER — PANTOPRAZOLE SODIUM 20 MG PO TBEC: 20.0000 mg | DELAYED_RELEASE_TABLET | Freq: Every day | ORAL | 0 refills | Status: DC

## 2021-01-20 MED ORDER — ALUM & MAG HYDROXIDE-SIMETH 200-200-20 MG/5ML PO SUSP
30.0000 mL | Freq: Once | ORAL | Status: AC
  Administered 2021-01-20: 30 mL via ORAL
  Filled 2021-01-20: qty 30

## 2021-01-20 NOTE — Discharge Instructions (Signed)
Follow-up with your primary care doctor.  Please return to the ED if symptoms worsen.

## 2021-01-20 NOTE — ED Triage Notes (Signed)
Pt presents to ED with c/o chest pain starting last night. This morning began experiencing SHOB, pain increased after breakfast. Denies blood in stool or abdominal pain. C/o pain radiating to back. Denies urinary s/sx.

## 2021-01-20 NOTE — ED Provider Notes (Signed)
[facility] DEPT Provider Note   CSN: [id number] Arrival date & time: 01/20/21  1010     History Chief Complaint  Patient presents with  . Chest Pain  . Abdominal Pain    Marc Stevens is a 60 y.o. male.  The history is provided by the patient.  Chest Pain Pain location:  Substernal area, epigastric and L lateral chest (LEFT FLANK) Pain quality: aching   Pain radiates to:  Does not radiate Pain severity:  Mild Onset quality:  Gradual Timing:  Intermittent Progression:  Waxing and waning Chronicity:  New Context: at rest   Relieved by:  Nothing Worsened by:  Nothing Associated symptoms: abdominal pain (left flank)   Associated symptoms: no anxiety, no back pain, no cough, no fever, no nausea, no palpitations, no shortness of breath, no syncope and no vomiting   Risk factors: high cholesterol and hypertension   Risk factors: no diabetes mellitus        Past Medical History:  Diagnosis Date  . BPH (benign prostatic hypertrophy)   . Complete rotator cuff tear 08/08/2015  . Diabetes mellitus without complication ([place])   . ED (erectile dysfunction)   . HTN (hypertension) 04/18/2012  . Hyperlipidemia   . Low testosterone   . Morbidly obese ([place])   . OSA (obstructive sleep apnea)    cpap - does not know settings   . Rotator cuff tear    RT  . Torn Achilles tendon 07/11/2015  . Torn Achilles tendon 07/11/2015    Patient Active Problem List   Diagnosis Date Noted  . Benign essential hypertension 11/07/2019  . Proteinuria 11/07/2019  . Type 2 diabetes mellitus with other diabetic kidney complication ([place]) 86/57/8469  . Lymphedema 06/16/2018  . 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 Prediabetes 08/13/2017  . Mitral regurgitation 01/17/2016  . LVH (left ventricular hypertrophy) 01/01/2016  . Low testosterone 05/14/2015  . 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 Vitamin D deficiency 04/26/2015  . Beta thalassemia  minor 04/26/2015  . Class 2 severe obesity with serious comorbidity and body mass index (BMI) of 37.0 to 37.9 in adult [facility])   . ED (erectile dysfunction)   . Hyperlipidemia   . OSA (obstructive sleep apnea)   . HTN (hypertension) 04/18/2012    Past Surgical History:  Procedure Laterality Date  . COLONOSCOPY WITH PROPOFOL N/A 02/18/2018   Procedure: COLONOSCOPY WITH PROPOFOL;  Surgeon: Jonathon Bellows, MD;  Location: [facility] ENDOSCOPY;  Service: Gastroenterology;  Laterality: N/A;  . DOPPLER ECHOCARDIOGRAPHY  2013  . left kene arthroscopy     . LUMBAR LAMINECTOMY/DECOMPRESSION MICRODISCECTOMY N/A 01/30/2015   Procedure: MICRO LUMBAR DECOMPRESSION, BILATERAL FORAMINOTOMIES, MICRODISCECTOMY LUMBAR FOUR TO LUMBAR FIVE;  Surgeon: Susa Day, MD;  Location: [facility] ORS;  Service: Orthopedics;  Laterality: N/A;  . right knee surgery    . ROTATOR CUFF REPAIR  2007   Left  . SHOULDER OPEN ROTATOR CUFF REPAIR Right 08/08/2015   Procedure: RIGHT SHOULDER MINI OPEN ROTATOR CUFF REPAIR SUBACROMIAL DECOMPRESSION ;  Surgeon: Susa Day, MD;  Location: [facility] ORS;  Service: Orthopedics;  Laterality: Right;       Family History  Problem Relation Age of Onset  . Dementia Mother   . Hypertension Mother   . Cancer Father        colon  . Heart disease Father   . Congestive Heart Failure Father   . Hypertension Sister   . Dementia Maternal Grandmother   . Kidney disease Neg Hx   .  Prostate cancer Neg Hx   . COPD Neg Hx   . Diabetes Neg Hx   . Stroke Neg Hx   . Kidney cancer Neg Hx   . Bladder Cancer Neg Hx     Social History   Tobacco Use  . Smoking status: Former Smoker    Packs/day: 0.50    Years: 30.00    Pack years: 15.00    Types: Cigarettes    Quit date: 10/19/2004    Years since quitting: 16.2  . Smokeless tobacco: Never Used  Vaping Use  . Vaping Use: Never used  Substance Use Topics  . Alcohol use: Yes    Alcohol/week: 4.0 standard drinks    Types: 4 Shots of liquor per week     Comment: occasional  . Drug use: No    Home Medications Prior to Admission medications   Medication Sig Start Date End Date Taking? Authorizing Provider  pantoprazole (PROTONIX) 20 MG tablet Take 1 tablet (20 mg total) by mouth daily for 14 days. 01/20/21 02/03/21 Yes Phynix Horton, DO  sucralfate (CARAFATE) 1 g tablet Take 1 tablet (1 g total) by mouth 4 (four) times daily -  with meals and at bedtime for 14 days. 01/20/21 02/03/21 Yes Arieonna Medine, DO  amLODipine (NORVASC) 2.5 MG tablet TAKE 1 TABLET BY MOUTH EVERY DAY 04/17/20   Delsa Grana, PA-C  aspirin EC 81 MG tablet Take 1 tablet (81 mg total) by mouth every morning. Resume 4 days post-op 02/01/15   Lacie Draft M, PA-C  atorvastatin (LIPITOR) 10 MG tablet Take 1 tablet (10 mg total) by mouth at bedtime. 06/28/20   Delsa Grana, PA-C  carvedilol (COREG) 12.5 MG tablet Take 1 tablet (12.5 mg total) by mouth 2 (two) times daily with a meal. 07/02/20   Delsa Grana, PA-C  diclofenac Sodium (VOLTAREN) 1 % GEL diclofenac 1 % topical gel  APPLY 2 GRAMS TO THE AFFECTED AREA(S) BY TOPICAL ROUTE 4 TIMES PER DAY    [provider]  gabapentin (NEURONTIN) 600 MG tablet Take 1 tablet (600 mg total) by mouth 3 (three) times daily. 10/26/19   Hyatt, Max T, DPM  ketoconazole (NIZORAL) 2 % cream APPLY TO AFFECTED AREA TWICE A DAY 01/07/21   Delsa Grana, PA-C  losartan-hydrochlorothiazide (HYZAAR) 100-25 MG tablet Take 1 tablet by mouth daily. 06/28/20   Delsa Grana, PA-C  metFORMIN (GLUCOPHAGE-XR) 500 MG 24 hr tablet TAKE 2 TABLETS BY MOUTH EVERY DAY WITH BREAKFAST 07/02/20   Delsa Grana, PA-C  Multiple Vitamin (MULTIVITAMIN) tablet Take 1 tablet by mouth daily.    [provider]  sildenafil (VIAGRA) 100 MG tablet Take 1 tablet (100 mg total) by mouth daily as needed for erectile dysfunction. 07/08/20   Zara Council A, PA-C  VITAMIN D, ERGOCALCIFEROL, PO Take 5,000 Units by mouth daily.    [provider]    Allergies     Patient has no known allergies.  Review of Systems   Review of Systems  Constitutional: Negative for chills and fever.  HENT: Negative for ear pain and sore throat.   Eyes: Negative for pain and visual disturbance.  Respiratory: Negative for cough and shortness of breath.   Cardiovascular: Positive for chest pain. Negative for palpitations and syncope.  Gastrointestinal: Positive for abdominal pain (left flank). Negative for abdominal distention, anal bleeding, blood in stool, constipation, diarrhea, nausea, rectal pain and vomiting.  Genitourinary: Negative for dysuria and hematuria.  Musculoskeletal: Negative for arthralgias and back pain.  Skin: Negative  for color change and rash.  Neurological: Negative for seizures and syncope.  All other systems reviewed and are negative.   Physical Exam Updated Vital Signs  ED Triage Vitals  Enc Vitals Group     BP 01/20/21 1024 (!) 157/85     Pulse Rate 01/20/21 1024 79     Resp 01/20/21 1024 19     Temp 01/20/21 1024 98.2 F (36.8 C)     Temp Source 01/20/21 1024 Oral     SpO2 01/20/21 1024 99 %     Weight 01/20/21 1028 272 lb (123.4 kg)     Height 01/20/21 1028 5\' 10"  (1.778 m)     Head Circumference --      Peak Flow --      Pain Score 01/20/21 1027 6     Pain Loc --      Pain Edu? --      Excl. in [place]? --     Physical Exam Vitals and nursing note reviewed.  Constitutional:      General: He is not in acute distress.    Appearance: He is well-developed. He is not ill-appearing.  HENT:     Head: Normocephalic and atraumatic.  Eyes:     Extraocular Movements: Extraocular movements intact.     Conjunctiva/sclera: Conjunctivae normal.     Pupils: Pupils are equal, round, and reactive to light.  Cardiovascular:     Rate and Rhythm: Normal rate and regular rhythm.     Pulses:          Radial pulses are 2+ on the right side and 2+ on the left side.     Heart sounds: Normal heart sounds. No murmur heard.   Pulmonary:      Effort: Pulmonary effort is normal. No respiratory distress.     Breath sounds: Normal breath sounds. No decreased breath sounds, wheezing or rhonchi.  Abdominal:     Palpations: Abdomen is soft.     Tenderness: There is abdominal tenderness (left flank).  Musculoskeletal:        General: Normal range of motion.     Cervical back: Normal range of motion and neck supple.     Right lower leg: No edema.     Left lower leg: No edema.  Skin:    General: Skin is warm and dry.     Capillary Refill: Capillary refill takes less than 2 seconds.  Neurological:     General: No focal deficit present.     Mental Status: He is alert.     ED Results / Procedures / Treatments   Labs (all labs ordered are listed, but only abnormal results are displayed) Labs Reviewed  CBC WITH DIFFERENTIAL/PLATELET - Abnormal; Notable for the following components:      Result Value   RBC 7.64 (*)    MCV 66.0 (*)    MCH 20.8 (*)    RDW 18.8 (*)    All other components within normal limits  BASIC METABOLIC PANEL - Abnormal; Notable for the following components:   Glucose, Bld 163 (*)    BUN 26 (*)    All other components within normal limits  URINALYSIS, ROUTINE W REFLEX MICROSCOPIC - Abnormal; Notable for the following components:   Glucose, UA 50 (*)    Leukocytes,Ua SMALL (*)    All other components within normal limits  LIPASE, BLOOD  HEPATIC FUNCTION PANEL  D-DIMER, QUANTITATIVE  TROPONIN I (HIGH SENSITIVITY)  TROPONIN I (HIGH SENSITIVITY)    EKG  EKG Interpretation  Date/Time:  Monday January 20 2021 10:21:09 EDT Ventricular Rate:  77 PR Interval:  135 QRS Duration: 96 QT Interval:  385 QTC Calculation: 436 R Axis:   76 Text Interpretation: Sinus rhythm Abnormal R-wave progression, early transition No significant change since last tracing Confirmed by Lennice Sites ([id number]) on 01/20/2021 11:08:25 AM   Radiology DG Chest Portable 1 View  Result Date: 01/20/2021 CLINICAL DATA:  60 year old male  with chest pain since last night. Shortness of breath this morning. Former smoker. EXAM: PORTABLE CHEST 1 VIEW COMPARISON:  Chest radiographs 12/28/2015 and earlier. FINDINGS: Portable AP semi upright views at 1049 hours. Lung volumes and mediastinal contours are stable since 2017 and within normal limits. Stable mild chronic increased pulmonary interstitial opacity - likely smoking related. No pneumothorax, pulmonary edema, pleural effusion or confluent opacity. Visualized tracheal air column is within normal limits. No acute osseous abnormality identified. Paucity of bowel gas in the upper abdomen. IMPRESSION: No acute cardiopulmonary abnormality. Electronically Signed   By: Genevie Ann M.D.   On: 01/20/2021 11:05    Procedures Procedures   Medications Ordered in ED Medications  fentaNYL (SUBLIMAZE) injection 50 mcg (50 mcg Intravenous Given 01/20/21 1053)  sodium chloride 0.9 % bolus 1,000 mL (1,000 mLs Intravenous New Bag/Given 01/20/21 1053)  alum & mag hydroxide-simeth (MAALOX/MYLANTA) 200-200-20 MG/5ML suspension 30 mL (30 mLs Oral Given 01/20/21 1209)    And  lidocaine (XYLOCAINE) 2 % viscous mouth solution 15 mL (15 mLs Oral Given 01/20/21 1209)    ED Course  I have reviewed the triage vital signs and the nursing notes.  Pertinent labs & imaging results that were available during my care of the patient were reviewed by me and considered in my medical decision making (see chart for details).    MDM Rules/Calculators/A&P                          REAKWON BARREN is a 60 year old male with history of diabetes, hypertension, high cholesterol presents the ED with chest pain, flank pain, abdominal pain.  Pain on and off since last night.  He is not sure what makes it worse or better.  He has a history of kidney stones as well.  Does not feel quite like his prior kidney stones.  Pain is mostly in the epigastric, lower chest, left flank on exam.  No abdominal tenderness in the lower areas.  Clear  breath sounds.  Does state it kind of hurts when he takes a deep breath as well.  Differential is wide as this could be cardiac/ACS, infectious, PE, kidney stone, pancreatitis, cholecystitis.  Will check lab work including troponin, EKG, chest x-ray.  Will give fluid bolus and IV fentanyl.  Will reevaluate. EKG with no obvious ischemic changes.  Does admit to alcohol last night.  Troponin negative x2.  Doubt ACS.  D-dimer normal and doubt PE.  Urinalysis negative for infection and no hematuria and doubt kidney stones.  Lipase is normal doubt pancreatitis.  Overall lab work is unremarkable.  Felt better after GI cocktail.  Suspect gastritis secondary to alcohol use.  Will start on PPI and Carafate.  Given return precautions.  Recommend follow-up with primary care doctor.  Given return precautions.  This chart was dictated using voice recognition software.  Despite best efforts to proofread,  errors can occur which can change the documentation meaning.    Final Clinical Impression(s) / ED Diagnoses Final diagnoses:  Epigastric pain  Rx / DC Orders ED Discharge Orders         Ordered    sucralfate (CARAFATE) 1 g tablet  3 times daily with meals & bedtime        01/20/21 1315    pantoprazole (PROTONIX) 20 MG tablet  Daily        01/20/21 1315           Lennice Sites, DO 01/20/21 1316

## 2021-01-21 ENCOUNTER — Ambulatory Visit: Payer: BC Managed Care – PPO | Admitting: Physician Assistant

## 2021-02-14 ENCOUNTER — Encounter: Payer: Self-pay | Admitting: Family Medicine

## 2021-02-14 ENCOUNTER — Ambulatory Visit: Payer: BC Managed Care – PPO | Admitting: Family Medicine

## 2021-02-14 ENCOUNTER — Other Ambulatory Visit: Payer: Self-pay

## 2021-02-14 VITALS — BP 134/70 | HR 68 | Temp 98.2°F | Resp 16 | Ht 70.0 in | Wt 280.9 lb

## 2021-02-14 DIAGNOSIS — D709 Neutropenia, unspecified: Secondary | ICD-10-CM | POA: Insufficient documentation

## 2021-02-14 DIAGNOSIS — E1129 Type 2 diabetes mellitus with other diabetic kidney complication: Secondary | ICD-10-CM

## 2021-02-14 DIAGNOSIS — R1013 Epigastric pain: Secondary | ICD-10-CM

## 2021-02-14 DIAGNOSIS — Z09 Encounter for follow-up examination after completed treatment for conditions other than malignant neoplasm: Secondary | ICD-10-CM

## 2021-02-14 DIAGNOSIS — G4733 Obstructive sleep apnea (adult) (pediatric): Secondary | ICD-10-CM

## 2021-02-14 DIAGNOSIS — I10 Essential (primary) hypertension: Secondary | ICD-10-CM

## 2021-02-14 DIAGNOSIS — R635 Abnormal weight gain: Secondary | ICD-10-CM | POA: Diagnosis not present

## 2021-02-14 DIAGNOSIS — Z6837 Body mass index (BMI) 37.0-37.9, adult: Secondary | ICD-10-CM

## 2021-02-14 DIAGNOSIS — D561 Beta thalassemia: Secondary | ICD-10-CM

## 2021-02-14 DIAGNOSIS — Z5181 Encounter for therapeutic drug level monitoring: Secondary | ICD-10-CM

## 2021-02-14 MED ORDER — GABAPENTIN 600 MG PO TABS
600.0000 mg | ORAL_TABLET | Freq: Three times a day (TID) | ORAL | 3 refills | Status: DC
Start: 2021-02-14 — End: 2022-10-02

## 2021-02-14 MED ORDER — PANTOPRAZOLE SODIUM 20 MG PO TBEC: 20.0000 mg | DELAYED_RELEASE_TABLET | ORAL | 1 refills | Status: DC

## 2021-02-14 MED ORDER — AMLODIPINE BESYLATE 2.5 MG PO TABS: 2.5000 mg | ORAL_TABLET | Freq: Every day | ORAL | 3 refills | Status: DC

## 2021-02-14 NOTE — Progress Notes (Signed)
Name: Marc Stevens   MRN: [id number]    DOB: 01/30/1961   Date:02/14/2021       Progress Note  Chief Complaint  Patient presents with  . Follow-up    ER epigastric pain     Subjective:   Marc Stevens is a 60 y.o. male, presents to clinic for ER and routine f/up  Stomach pain and ER f/up was given carafate and protonix, finished both meds and feels better Early fullness, late nighttime eating  He is gaining weight with eating less  Normal BMs    Weight gain-  Wt Readings from Last 5 Encounters:  02/14/21 280 lb 14.4 oz (127.4 kg)  01/20/21 272 lb (123.4 kg)  09/06/20 263 lb 9.6 oz (119.6 kg)  07/15/20 266 lb (120.7 kg)  07/08/20 268 lb (121.6 kg)   BMI Readings from Last 5 Encounters:  02/14/21 40.30 kg/m  01/20/21 39.03 kg/m  09/06/20 38.09 kg/m  07/15/20 37.10 kg/m  07/08/20 37.38 kg/m   CPAP OSA - needs new supplies - needs new hose and mask maybe only 702-59 years old        Current Outpatient Medications:  .  amLODipine (NORVASC) 2.5 MG tablet, TAKE 1 TABLET BY MOUTH EVERY DAY, Disp: 90 tablet, Rfl: 3 .  aspirin EC 81 MG tablet, Take 1 tablet (81 mg total) by mouth every morning. Resume 4 days post-op, Disp: , Rfl:  .  atorvastatin (LIPITOR) 10 MG tablet, Take 1 tablet (10 mg total) by mouth at bedtime., Disp: 90 tablet, Rfl: 3 .  carvedilol (COREG) 12.5 MG tablet, Take 1 tablet (12.5 mg total) by mouth 2 (two) times daily with a meal., Disp: 180 tablet, Rfl: 3 .  gabapentin (NEURONTIN) 600 MG tablet, Take 1 tablet (600 mg total) by mouth 3 (three) times daily., Disp: 270 tablet, Rfl: 3 .  ketoconazole (NIZORAL) 2 % cream, APPLY TO AFFECTED AREA TWICE A DAY, Disp: 45 g, Rfl: 3 .  losartan-hydrochlorothiazide (HYZAAR) 100-25 MG tablet, Take 1 tablet by mouth daily., Disp: 90 tablet, Rfl: 3 .  meloxicam (MOBIC) 15 MG tablet, meloxicam 15 mg tablet  TAKE 1 TABLET BY MOUTH EVERY DAY AS NEEDED WITH MEALS, Disp: , Rfl:  .  metFORMIN (GLUCOPHAGE-XR)  500 MG 24 hr tablet, TAKE 2 TABLETS BY MOUTH EVERY DAY WITH BREAKFAST, Disp: 180 tablet, Rfl: 3 .  Multiple Vitamin (MULTIVITAMIN) tablet, Take 1 tablet by mouth daily., Disp: , Rfl:  .  sildenafil (VIAGRA) 100 MG tablet, Take 1 tablet (100 mg total) by mouth daily as needed for erectile dysfunction., Disp: 30 tablet, Rfl: 3 .  VITAMIN D, ERGOCALCIFEROL, PO, Take 5,000 Units by mouth daily., Disp: , Rfl:   Patient Active Problem List   Diagnosis Date Noted  . Benign essential hypertension 11/07/2019  . Proteinuria 11/07/2019  . Type 2 diabetes mellitus with other diabetic kidney complication (HCC) 11/07/2019  . Lymphedema 06/16/2018  . 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 Prediabetes 08/13/2017  . Mitral regurgitation 01/17/2016  . LVH (left ventricular hypertrophy) 01/01/2016  . Low testosterone 05/14/2015  . 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 Vitamin D deficiency 04/26/2015  . Beta thalassemia minor 04/26/2015  . Class 2 severe obesity with serious comorbidity and body mass index (BMI) of 37.0 to 37.9 in adult [facility](HCC)   . ED (erectile dysfunction)   . Hyperlipidemia   . OSA (obstructive sleep apnea)   . HTN (hypertension) 04/18/2012    Past Surgical History:  Procedure Laterality Date  . COLONOSCOPY WITH PROPOFOL N/A 02/18/2018   Procedure: COLONOSCOPY WITH PROPOFOL;  Surgeon: Jonathon Bellows, MD;  Location: [facility] ENDOSCOPY;  Service: Gastroenterology;  Laterality: N/A;  . DOPPLER ECHOCARDIOGRAPHY  2013  . left kene arthroscopy     . LUMBAR LAMINECTOMY/DECOMPRESSION MICRODISCECTOMY N/A 01/30/2015   Procedure: MICRO LUMBAR DECOMPRESSION, BILATERAL FORAMINOTOMIES, MICRODISCECTOMY LUMBAR FOUR TO LUMBAR FIVE;  Surgeon: Susa Day, MD;  Location: [facility] ORS;  Service: Orthopedics;  Laterality: N/A;  . right knee surgery    . ROTATOR CUFF REPAIR  2007   Left  . SHOULDER OPEN ROTATOR CUFF REPAIR Right 08/08/2015   Procedure: RIGHT SHOULDER MINI OPEN ROTATOR CUFF REPAIR  SUBACROMIAL DECOMPRESSION ;  Surgeon: Susa Day, MD;  Location: [facility] ORS;  Service: Orthopedics;  Laterality: Right;    Family History  Problem Relation Age of Onset  . Dementia Mother   . Hypertension Mother   . Cancer Father        colon  . Heart disease Father   . Congestive Heart Failure Father   . Hypertension Sister   . Dementia Maternal Grandmother   . Kidney disease Neg Hx   . Prostate cancer Neg Hx   . COPD Neg Hx   . Diabetes Neg Hx   . Stroke Neg Hx   . Kidney cancer Neg Hx   . Bladder Cancer Neg Hx     Social History   Tobacco Use  . Smoking status: Former Smoker    Packs/day: 0.50    Years: 30.00    Pack years: 15.00    Types: Cigarettes    Quit date: 10/19/2004    Years since quitting: 16.3  . Smokeless tobacco: Never Used  Vaping Use  . Vaping Use: Never used  Substance Use Topics  . Alcohol use: Yes    Alcohol/week: 4.0 standard drinks    Types: 4 Shots of liquor per week    Comment: occasional  . Drug use: No     No Known Allergies  Health Maintenance  Topic Date Due  . OPHTHALMOLOGY EXAM  Never done  . COVID-19 Vaccine (3 - Booster for Moderna series) 09/27/2020  . PNEUMOCOCCAL POLYSACCHARIDE VACCINE AGE 25-64 HIGH RISK  06/28/2021 (Originally 02/11/1964)  . FOOT EXAM  10/15/2021 (Originally 02/11/1972)  . HEMOGLOBIN A1C  03/06/2021  . INFLUENZA VACCINE  05/19/2021  . COLONOSCOPY (Pts 45-40yrs Insurance coverage will need to be confirmed)  02/19/2023  . TETANUS/TDAP  11/14/2027  . Hepatitis C Screening  Completed  . HIV Screening  Completed  . HPV VACCINES  Aged Out    Chart Review Today: I personally reviewed active problem list, medication list, allergies, family history, social history, health maintenance, notes from last encounter, lab results, imaging with the patient/caregiver today.   Review of Systems  Constitutional: Negative.   HENT: Negative.   Eyes: Negative.   Respiratory: Negative.   Cardiovascular: Negative.    Gastrointestinal: Negative.   Endocrine: Negative.   Genitourinary: Negative.   Musculoskeletal: Negative.   Skin: Negative.   Allergic/Immunologic: Negative.   Neurological: Negative.   Hematological: Negative.   Psychiatric/Behavioral: Negative.   All other systems reviewed and are negative.    Objective:   Vitals:   02/14/21 0926  BP: 134/70  Pulse: 68  Resp: 16  Temp: 98.2 F (36.8 C)  SpO2: 97%  Weight: 280 lb 14.4 oz (127.4 kg)  Height: 5\' 10"  (1.778 m)    Body mass index is 40.3 kg/m.  Physical Exam  Vitals and nursing note reviewed.  Constitutional:      General: He is not in acute distress.    Appearance: Normal appearance. He is well-developed. He is obese. He is not ill-appearing, toxic-appearing or diaphoretic.     Interventions: Face mask in place.  HENT:     Head: Normocephalic and atraumatic.     Jaw: No trismus.     Right Ear: External ear normal.     Left Ear: External ear normal.  Eyes:     General: Lids are normal. No scleral icterus.       Right eye: No discharge.        Left eye: No discharge.     Conjunctiva/sclera: Conjunctivae normal.  Neck:     Trachea: Trachea and phonation normal. No tracheal deviation.  Cardiovascular:     Rate and Rhythm: Normal rate and regular rhythm.     Pulses: Normal pulses.          Radial pulses are 2+ on the right side and 2+ on the left side.       Posterior tibial pulses are 2+ on the right side and 2+ on the left side.     Heart sounds: Normal heart sounds. No murmur heard. No friction rub. No gallop.   Pulmonary:     Effort: Pulmonary effort is normal. No respiratory distress.     Breath sounds: Normal breath sounds. No stridor. No wheezing, rhonchi or rales.  Abdominal:     General: Bowel sounds are normal. There is no distension.     Palpations: Abdomen is soft.     Tenderness: There is no abdominal tenderness. There is no right CVA tenderness, left CVA tenderness, guarding or rebound.   Musculoskeletal:     Right lower leg: No edema.     Left lower leg: No edema.  Skin:    General: Skin is warm and dry.     Coloration: Skin is not jaundiced.     Findings: No rash.     Nails: There is no clubbing.  Neurological:     Mental Status: He is alert. Mental status is at baseline.     Cranial Nerves: No dysarthria or facial asymmetry.     Motor: No tremor or abnormal muscle tone.     Gait: Gait normal.  Psychiatric:        Mood and Affect: Mood normal.        Speech: Speech normal.        Behavior: Behavior normal. Behavior is cooperative.         Assessment & Plan:     ICD-10-CM   1. Epigastric pain  R10.13    Refill on Protonix, continue daily for the next 1 to 2 months, continue to work on GERD diet efforts  2. Weight gain  R63.5    Be careful with diet, increase exercise, weight gain may be due to lifestyle or need for new CPAP supplies and obstructive sleep apnea  3. Primary hypertension  I10 amLODipine (NORVASC) 2.5 MG tablet   Fairly well-controlled today, refill on Norvasc  4. Type 2 diabetes mellitus with other diabetic kidney complication (HCC)  S01.09 Hemoglobin A1c   Due for recheck A1c  5. OSA (obstructive sleep apnea)  G47.33    Patient needs new CPAP supplies  6. Encounter for examination following treatment at hospital  Z09    Reviewed ER visit, results, treatment and follow-up plan  7. Neutropenia, unspecified type ([place])  D70.9  Blood levels have been stable, reviewed labs from hospital encounter  8. Class 2 severe obesity due to excess calories with serious comorbidity and body mass index (BMI) of 37.0 to 37.9 in adult [facility]) Chronic E66.01    Z68.37   9. Beta-thalassemia (HCC) Chronic D56.1    Blood levels have been stable he has declined to see a hematologist  10. Medication monitoring encounter  Z51.81 meloxicam (MOBIC) 15 MG tablet    gabapentin (NEURONTIN) 600 MG tablet    Hemoglobin A1c    amLODipine (NORVASC) 2.5 MG tablet      Return for 1 month f/up virtual to discuss GI/protonix, A1C, CPAP supplies  .   Delsa Grana, PA-C 02/14/21 9:40 AM

## 2021-02-15 LAB — HEMOGLOBIN A1C
Hgb A1c MFr Bld: 5.9 % of total Hgb — ABNORMAL HIGH (ref <5.7)
Mean Plasma Glucose: 123 mg/dL
eAG (mmol/L): 6.8 mmol/L

## 2021-03-07 ENCOUNTER — Other Ambulatory Visit: Payer: Self-pay

## 2021-03-07 ENCOUNTER — Encounter: Payer: Self-pay | Admitting: Family Medicine

## 2021-03-07 ENCOUNTER — Telehealth (INDEPENDENT_AMBULATORY_CARE_PROVIDER_SITE_OTHER): Payer: BC Managed Care – PPO | Admitting: Family Medicine

## 2021-03-07 DIAGNOSIS — R1013 Epigastric pain: Secondary | ICD-10-CM

## 2021-03-07 DIAGNOSIS — G4733 Obstructive sleep apnea (adult) (pediatric): Secondary | ICD-10-CM

## 2021-03-07 DIAGNOSIS — E1129 Type 2 diabetes mellitus with other diabetic kidney complication: Secondary | ICD-10-CM

## 2021-03-07 DIAGNOSIS — Z6837 Body mass index (BMI) 37.0-37.9, adult: Secondary | ICD-10-CM

## 2021-03-07 MED ORDER — PANTOPRAZOLE SODIUM 20 MG PO TBEC: 20.0000 mg | DELAYED_RELEASE_TABLET | ORAL | 1 refills | Status: DC

## 2021-03-07 NOTE — Progress Notes (Signed)
Pt declines to do virtual f/up visit -  States protonix is working, would like refill

## 2021-06-13 NOTE — Progress Notes (Signed)
9:03 AM   Marc Stevens Sep 20, 1961 [id number]  Referring provider: Delsa Grana, PA-C [address] [place],  [place] [address]  Urological history: 1.  BPH with LU TS -PSA pending -I PSS 4/0  2. ED -Contributing factors of age, HTN, sleep apnea, BPH, obesity, hyperlipidemia and age -SHIM 62 -sildenafil 100 mg, on-demand dosing  Chief Complaint  Patient presents with   Follow-up    1 year follow-up     [name] is a 60 y.o. male who presents today for yearly follow up.  Patient still having spontaneous erections.    He denies any pain or curvature with erections.  He is having good success with sildenafil 100 mg, on-demand-dosing.     SHIM     Row Name 06/16/21 0843         SHIM: Over the last 6 months:   How do you rate your confidence that you could get and keep an erection? Moderate     When you had erections with sexual stimulation, how often were your erections hard enough for penetration (entering your partner)? Sometimes (about half the time)     During sexual intercourse, how often were you able to maintain your erection after you had penetrated (entered) your partner? Sometimes (about half the time)     During sexual intercourse, how difficult was it to maintain your erection to completion of intercourse? Slightly Difficult     When you attempted sexual intercourse, how often was it satisfactory for you? Sometimes (about half the time)           SHIM Total Score   SHIM 16             He has no urinary complaints at this visit.   Patient denies any modifying or aggravating factors.  Patient denies any gross hematuria, dysuria or suprapubic/flank pain.  Patient denies any fevers, chills, nausea or vomiting.     IPSS     Row Name 06/16/21 0800         International Prostate Symptom Score   How often have you had the sensation of not emptying your bladder? Not at All     How often have you had to urinate less  than every two hours? Not at All     How often have you found you stopped and started again several times when you urinated? Not at All     How often have you found it difficult to postpone urination? Less than half the time     How often have you had a weak urinary stream? Less than 1 in 5 times     How often have you had to strain to start urination? Not at All     How many times did you typically get up at night to urinate? 1 Time     Total IPSS Score 4           Quality of Life due to urinary symptoms   If you were to spend the rest of your life with your urinary condition just the way it is now how would you feel about that? Delighted              Score:  1-7 Mild 8-19 Moderate 20-35 Severe    PMH: Past Medical History:  Diagnosis Date   BPH (benign prostatic hypertrophy)    Complete rotator cuff tear 08/08/2015   Diabetes mellitus without complication [facility])    ED (  erectile dysfunction)    HTN (hypertension) 04/18/2012   Hyperlipidemia    Low testosterone    Morbidly obese (HCC)    OSA (obstructive sleep apnea)    cpap - does not know settings    Rotator cuff tear    RT   Torn Achilles tendon 07/11/2015   Torn Achilles tendon 07/11/2015    Surgical History: Past Surgical History:  Procedure Laterality Date   COLONOSCOPY WITH PROPOFOL N/A 02/18/2018   Procedure: COLONOSCOPY WITH PROPOFOL;  Surgeon: Jonathon Bellows, MD;  Location: [facility] ENDOSCOPY;  Service: Gastroenterology;  Laterality: N/A;   DOPPLER ECHOCARDIOGRAPHY  2013   left kene arthroscopy      LUMBAR LAMINECTOMY/DECOMPRESSION MICRODISCECTOMY N/A 01/30/2015   Procedure: MICRO LUMBAR DECOMPRESSION, BILATERAL FORAMINOTOMIES, MICRODISCECTOMY LUMBAR FOUR TO LUMBAR FIVE;  Surgeon: Susa Day, MD;  Location: [facility] ORS;  Service: Orthopedics;  Laterality: N/A;   right knee surgery     ROTATOR CUFF REPAIR  2007   Left   SHOULDER OPEN ROTATOR CUFF REPAIR Right 08/08/2015   Procedure: RIGHT SHOULDER MINI OPEN ROTATOR  CUFF REPAIR SUBACROMIAL DECOMPRESSION ;  Surgeon: Susa Day, MD;  Location: [facility] ORS;  Service: Orthopedics;  Laterality: Right;    Home Medications:  Allergies as of 06/16/2021   No Known Allergies      Medication List        Accurate as of June 16, 2021  9:03 AM. If you have any questions, ask your nurse or doctor.          amLODipine 2.5 MG tablet Commonly known as: NORVASC Take 1 tablet (2.5 mg total) by mouth daily.   aspirin EC 81 MG tablet Take 1 tablet (81 mg total) by mouth every morning. Resume 4 days post-op   atorvastatin 10 MG tablet Commonly known as: LIPITOR Take 1 tablet (10 mg total) by mouth at bedtime.   carvedilol 12.5 MG tablet Commonly known as: COREG Take 1 tablet (12.5 mg total) by mouth 2 (two) times daily with a meal.   gabapentin 600 MG tablet Commonly known as: Neurontin Take 1 tablet (600 mg total) by mouth 3 (three) times daily.   ketoconazole 2 % cream Commonly known as: NIZORAL APPLY TO AFFECTED AREA TWICE A DAY   losartan-hydrochlorothiazide 100-25 MG tablet Commonly known as: HYZAAR Take 1 tablet by mouth daily.   meloxicam 15 MG tablet Commonly known as: MOBIC meloxicam 15 mg tablet  TAKE 1 TABLET BY MOUTH EVERY DAY AS NEEDED WITH MEALS   metFORMIN 500 MG 24 hr tablet Commonly known as: GLUCOPHAGE-XR TAKE 2 TABLETS BY MOUTH EVERY DAY WITH BREAKFAST   multivitamin tablet Take 1 tablet by mouth daily.   pantoprazole 20 MG tablet Commonly known as: Protonix Take 1 tablet (20 mg total) by mouth every morning. One hour before breakfast   sildenafil 100 MG tablet Commonly known as: VIAGRA Take 1 tablet (100 mg total) by mouth daily as needed for erectile dysfunction.   VITAMIN D (ERGOCALCIFEROL) PO Take 5,000 Units by mouth daily.        Allergies: No Known Allergies  Family History: Family History  Problem Relation Age of Onset   Dementia Mother    Hypertension Mother    Cancer Father        colon    Heart disease Father    Congestive Heart Failure Father    Hypertension Sister    Dementia Maternal Grandmother    Kidney disease Neg Hx    Prostate cancer Neg Hx  COPD Neg Hx    Diabetes Neg Hx    Stroke Neg Hx    Kidney cancer Neg Hx    Bladder Cancer Neg Hx     Social History:  reports that he quit smoking about 16 years ago. His smoking use included cigarettes. He has a 15.00 pack-year smoking history. He has never used smokeless tobacco. He reports current alcohol use of about 4.0 standard drinks per week. He reports that he does not use drugs.  ROS: For pertinent review of systems please refer to history of present illness  Physical Exam: Blood pressure 113/70, pulse 65, height '5\' 11"'$  (1.803 m), weight 271 lb (122.9 kg).  Constitutional:  Well nourished. Alert and oriented, No acute distress. HEENT: [place] AT, mask in place.  Trachea midline Cardiovascular: No clubbing, cyanosis, or edema. Respiratory: Normal respiratory effort, no increased work of breathing. GU: No CVA tenderness.  No bladder fullness or masses.  Patient with uncircumcised phallus. Foreskin easily retracted  Urethral meatus is patent.  No penile discharge. No penile lesions or rashes. Scrotum without lesions, cysts, rashes and/or edema.  Testicles are located scrotally bilaterally. No masses are appreciated in the testicles. Left and right epididymis are normal. Rectal: Patient with  normal sphincter tone. Anus and perineum without scarring or rashes. No rectal masses are appreciated. Prostate is approximately 50 + grams, could only palpated the apex, no nodules are appreciated. Seminal vesicles could not be palpated Neurologic: Grossly intact, no focal deficits, moving all 4 extremities. Psychiatric: Normal mood and affect.   Laboratory Data: PSA Trend  1.8 in 02/2013  1.2 in 02/2016  1.4 in 05/2016  Component     Latest Ref Rng & Units 06/11/2017 07/06/2019  Prostatic Specific Antigen     0.00 - 4.00 ng/mL  1.39 1.12   Component     Latest Ref Rng & Units 01/20/2021  Color, Urine     YELLOW YELLOW  Appearance     CLEAR CLEAR  Specific Gravity, Urine     1.005 - 1.030 1.020  pH     5.0 - 8.0 5.0  Glucose, UA     NEGATIVE mg/dL 50 (A)  Hgb urine dipstick     NEGATIVE NEGATIVE  Bilirubin Urine     NEGATIVE NEGATIVE  Ketones, ur     NEGATIVE mg/dL NEGATIVE  Protein     NEGATIVE mg/dL NEGATIVE  Nitrite     NEGATIVE NEGATIVE  Leukocytes,Ua     NEGATIVE SMALL (A)  RBC / HPF     0 - 5 RBC/hpf 0-5  WBC, UA     0 - 5 WBC/hpf 11-20  Bacteria, UA     NONE SEEN NONE SEEN  Squamous Epithelial / LPF     0 - 5 0-5  Mucus      PRESENT   Component     Latest Ref Rng & Units 01/20/2021  Total Protein     6.5 - 8.1 g/dL 6.8  Albumin     3.5 - 5.0 g/dL 3.9  AST     15 - 41 U/L 30  ALT     0 - 44 U/L 26  Alkaline Phosphatase     38 - 126 U/L 77  Total Bilirubin     0.3 - 1.2 mg/dL 1.0  Bilirubin, Direct     0.0 - 0.2 mg/dL 0.2  Indirect Bilirubin     0.3 - 0.9 mg/dL 0.8   Component     Latest Ref Rng &  Units 01/20/2021  Sodium     135 - 145 mmol/L 141  Potassium     3.5 - 5.1 mmol/L 3.7  Chloride     98 - 111 mmol/L 105  CO2     22 - 32 mmol/L 26  Glucose     70 - 99 mg/dL 163 (H)  BUN     6 - 20 mg/dL 26 (H)  Creatinine     0.61 - 1.24 mg/dL 1.09  Calcium     8.9 - 10.3 mg/dL 9.2  GFR, Estimated     >60 mL/min >60  Anion gap     5 - 15 10   Component     Latest Ref Rng & Units 01/20/2021  WBC     4.0 - 10.5 K/uL 5.2  RBC     4.22 - 5.81 MIL/uL 7.64 (H)  Hemoglobin     13.0 - 17.0 g/dL 15.9  HCT     39.0 - 52.0 % 50.4  MCV     80.0 - 100.0 fL 66.0 (L)  MCH     26.0 - 34.0 pg 20.8 (L)  MCHC     30.0 - 36.0 g/dL 31.5  RDW     11.5 - 15.5 % 18.8 (H)  Platelets     150 - 400 K/uL 267  nRBC     0.0 - 0.2 % 0.0  Neutrophils     % 56  NEUT#     1.7 - 7.7 K/uL 2.9  Lymphocytes     % 29  Lymphocyte #     0.7 - 4.0 K/uL 1.5  Monocytes Relative     %  10  Monocyte #     0.1 - 1.0 K/uL 0.5  Eosinophil     % 4  Eosinophils Absolute     0.0 - 0.5 K/uL 0.2  Basophil     % 1  Basophils Absolute     0.0 - 0.1 K/uL 0.1  Immature Granulocytes     % 0  Abs Immature Granulocytes     0.00 - 0.07 K/uL 0.02  MPV     7.5 - 12.5 fL   Absolute Monocytes     200 - 950 cells/uL   Total Lymphocyte     %   Smear Review          Results for orders placed or performed in visit on 02/14/21  Hemoglobin A1c  Result Value Ref Range   Hgb A1c MFr Bld 5.9 (H) <5.7 % of total Hgb   Mean Plasma Glucose 123 mg/dL   eAG (mmol/L) 6.8 mmol/L    Lab Results  Component Value Date   CREATININE 1.09 01/20/2021    I have reviewed the labs  Assessment & Plan:    1. Erectile dysfunction -at goal with erections -continue sildenafil 100 mg, on-demand-dosing-refills  2 . BPH with LUTS -PSA pending -DRE benign -no bothersome symptoms   Return in about 1 year (around 06/16/2022) for IPSS, SHIM, PSA and exam.  Zara Council, [facility]  [address] [place], [place] [address] [phone]

## 2021-06-16 ENCOUNTER — Encounter: Payer: Self-pay | Admitting: Urology

## 2021-06-16 ENCOUNTER — Other Ambulatory Visit: Payer: Self-pay

## 2021-06-16 ENCOUNTER — Ambulatory Visit: Payer: BC Managed Care – PPO | Admitting: Urology

## 2021-06-16 ENCOUNTER — Other Ambulatory Visit
Admission: RE | Admit: 2021-06-16 | Discharge: 2021-06-16 | Disposition: A | Discharge status: Home / Self Care | Payer: BC Managed Care – PPO | Attending: Urology | Admitting: Urology

## 2021-06-16 ENCOUNTER — Other Ambulatory Visit: Payer: Self-pay | Admitting: *Deleted

## 2021-06-16 VITALS — BP 113/70 | HR 65 | Ht 71.0 in | Wt 271.0 lb

## 2021-06-16 DIAGNOSIS — N529 Male erectile dysfunction, unspecified: Secondary | ICD-10-CM

## 2021-06-16 DIAGNOSIS — N138 Other obstructive and reflux uropathy: Secondary | ICD-10-CM | POA: Insufficient documentation

## 2021-06-16 DIAGNOSIS — N401 Enlarged prostate with lower urinary tract symptoms: Secondary | ICD-10-CM | POA: Insufficient documentation

## 2021-06-16 LAB — PSA: Prostatic Specific Antigen: 0.86 ng/mL (ref 0.00–4.00)

## 2021-06-16 MED ORDER — SILDENAFIL CITRATE 100 MG PO TABS: 100.0000 mg | ORAL_TABLET | Freq: Every day | ORAL | 3 refills | Status: DC | PRN

## 2021-06-18 ENCOUNTER — Ambulatory Visit (INDEPENDENT_AMBULATORY_CARE_PROVIDER_SITE_OTHER): Payer: BC Managed Care – PPO | Admitting: Nurse Practitioner

## 2021-06-27 ENCOUNTER — Ambulatory Visit (INDEPENDENT_AMBULATORY_CARE_PROVIDER_SITE_OTHER): Payer: BC Managed Care – PPO | Admitting: Nurse Practitioner

## 2021-06-27 ENCOUNTER — Encounter (INDEPENDENT_AMBULATORY_CARE_PROVIDER_SITE_OTHER): Payer: Self-pay | Admitting: Nurse Practitioner

## 2021-06-27 ENCOUNTER — Other Ambulatory Visit: Payer: Self-pay

## 2021-06-27 VITALS — BP 130/78 | HR 71 | Resp 16 | Wt 267.4 lb

## 2021-06-27 DIAGNOSIS — E782 Mixed hyperlipidemia: Secondary | ICD-10-CM

## 2021-06-27 DIAGNOSIS — I890 Lymphedema, not elsewhere classified: Secondary | ICD-10-CM | POA: Diagnosis not present

## 2021-06-27 DIAGNOSIS — I10 Essential (primary) hypertension: Secondary | ICD-10-CM

## 2021-06-27 NOTE — Progress Notes (Signed)
Subjective:    [name], male    DOB: [birth year], 60 y.o.   MRN: [id number] Chief Complaint  Patient presents with   Follow-up    1 yr follow up    Marc Stevens is a 60 year old male that presents today for follow-up of his lymphedema.   The swelling has persisted but with the lymph pump the patient states the swelling is much better controlled. The pain associated with swelling is essentially eliminated. There have not been any interval development of a ulcerations or wounds.  No episodes of cellulitis or infection over the past 12 months  The patient denies problems with the pump, noting it is working well and the leggings are in good condition.  Since the previous visit the patient has been wearing graduated compression stockings and using the lymph pump on a routine basis and  has noted significant improvement in the lymphedema.   Patient stated the lymph pump has been a very positive factor in his care.          Review of Systems  Cardiovascular:  Negative for leg swelling.  All other systems reviewed and are negative.     Objective:   Physical Exam Vitals reviewed.  HENT:     Head: Normocephalic.  Cardiovascular:     Rate and Rhythm: Normal rate.     Pulses: Normal pulses.          Dorsalis pedis pulses are 2+ on the right side and 2+ on the left side.       Posterior tibial pulses are 2+ on the right side and 2+ on the left side.  Pulmonary:     Effort: Pulmonary effort is normal.  Musculoskeletal:     Right lower leg: No edema.     Left lower leg: No edema.  Neurological:     Mental Status: He is alert and oriented to person, place, and time.  Psychiatric:        Mood and Affect: Mood normal.        Behavior: Behavior normal.        Thought Content: Thought content normal.        Judgment: Judgment normal.    BP 130/78 (BP Location: Right Arm)   Pulse 71   Resp 16   Wt 267 lb 6.4 oz (121.3 kg)   BMI 37.29 kg/m   Past  Medical History:  Diagnosis Date   BPH (benign prostatic hypertrophy)    Complete rotator cuff tear 08/08/2015   Diabetes mellitus without complication (HCC)    ED (erectile dysfunction)    HTN (hypertension) 04/18/2012   Hyperlipidemia    Low testosterone    Morbidly obese (HCC)    OSA (obstructive sleep apnea)    cpap - does not know settings    Rotator cuff tear    RT   Torn Achilles tendon 07/11/2015   Torn Achilles tendon 07/11/2015    Social History   Socioeconomic History   Marital status: Single    Spouse name: Not on file   Number of children: Not on file   Years of education: Not on file   Highest education level: Not on file  Occupational History    Employer: [employer]    Employer: [employer]  Tobacco Use   Smoking status: Former    Packs/day: 0.50    Years: 30.00    Pack years: 15.00    Types: Cigarettes    Quit date: 10/19/2004  Years since quitting: 16.6   Smokeless tobacco: Never  Vaping Use   Vaping Use: Never used  Substance and Sexual Activity   Alcohol use: Yes    Alcohol/week: 4.0 standard drinks    Types: 4 Shots of liquor per week    Comment: occasional   Drug use: No   Sexual activity: Yes    Birth control/protection: None  Other Topics Concern   Not on file  Social History Narrative   Not on file   Social Determinants of Health   Financial Resource Strain: Low Risk    Difficulty of Paying Living Expenses: Not hard at all  Food Insecurity: No Food Insecurity   Worried About Charity fundraiser in the Last Year: Never true   [place] in the Last Year: Never true  Transportation Needs: No Transportation Needs   Lack of Transportation (Medical): No   Lack of Transportation (Non-Medical): No  Physical Activity: Inactive   Days of Exercise per Week: 0 days   Minutes of Exercise per Session: 0 min  Stress: No Stress Concern Present   Feeling of Stress : Only a little  Social Connections: Socially Isolated   Frequency of Communication  with Friends and Family: More than three times a week   Frequency of Social Gatherings with Friends and Family: Three times a week   Attends Religious Services: Never   Active Member of Clubs or Organizations: No   Attends Archivist Meetings: Never   Marital Status: Divorced  Human resources officer Violence: Not At Risk   Fear of Current or Ex-Partner: No   Emotionally Abused: No   Physically Abused: No   Sexually Abused: No    Past Surgical History:  Procedure Laterality Date   COLONOSCOPY WITH PROPOFOL N/A 02/18/2018   Procedure: COLONOSCOPY WITH PROPOFOL;  Surgeon: Jonathon Bellows, MD;  Location: [facility] ENDOSCOPY;  Service: Gastroenterology;  Laterality: N/A;   DOPPLER ECHOCARDIOGRAPHY  2013   left kene arthroscopy      LUMBAR LAMINECTOMY/DECOMPRESSION MICRODISCECTOMY N/A 01/30/2015   Procedure: MICRO LUMBAR DECOMPRESSION, BILATERAL FORAMINOTOMIES, MICRODISCECTOMY LUMBAR FOUR TO LUMBAR FIVE;  Surgeon: Susa Day, MD;  Location: [facility] ORS;  Service: Orthopedics;  Laterality: N/A;   right knee surgery     ROTATOR CUFF REPAIR  2007   Left   SHOULDER OPEN ROTATOR CUFF REPAIR Right 08/08/2015   Procedure: RIGHT SHOULDER MINI OPEN ROTATOR CUFF REPAIR SUBACROMIAL DECOMPRESSION ;  Surgeon: Susa Day, MD;  Location: [facility] ORS;  Service: Orthopedics;  Laterality: Right;    Family History  Problem Relation Age of Onset   Dementia Mother    Hypertension Mother    Cancer Father        colon   Heart disease Father    Congestive Heart Failure Father    Hypertension Sister    Dementia Maternal Grandmother    Kidney disease Neg Hx    Prostate cancer Neg Hx    COPD Neg Hx    Diabetes Neg Hx    Stroke Neg Hx    Kidney cancer Neg Hx    Bladder Cancer Neg Hx     No Known Allergies  CBC Latest Ref Rng & Units 01/20/2021 09/06/2020 01/26/2020  WBC 4.0 - 10.5 K/uL 5.2 4.6 4.1  Hemoglobin 13.0 - 17.0 g/dL 15.9 15.1 12.7(L)  Hematocrit 39.0 - 52.0 % 50.4 47.6 42.0  Platelets 150 - 400 K/uL 267  287 292      CMP     Component  Value Date/Time   NA 141 01/20/2021 1040   NA 141 02/21/2016 1110   NA 144 04/04/2014 1613   K 3.7 01/20/2021 1040   K 3.7 04/04/2014 1613   CL 105 01/20/2021 1040   CL 109 (H) 04/04/2014 1613   CO2 26 01/20/2021 1040   CO2 28 04/04/2014 1613   GLUCOSE 163 (H) 01/20/2021 1040   GLUCOSE 96 04/04/2014 1613   BUN 26 (H) 01/20/2021 1040   BUN 19 02/21/2016 1110   BUN 15 04/04/2014 1613   CREATININE 1.09 01/20/2021 1040   CREATININE 1.16 09/06/2020 0847   CALCIUM 9.2 01/20/2021 1040   CALCIUM 8.6 04/04/2014 1613   PROT 6.8 01/20/2021 1040   PROT 6.1 02/21/2016 1110   ALBUMIN 3.9 01/20/2021 1040   ALBUMIN 4.2 02/21/2016 1110   AST 30 01/20/2021 1040   ALT 26 01/20/2021 1040   ALKPHOS 77 01/20/2021 1040   BILITOT 1.0 01/20/2021 1040   BILITOT 0.3 02/21/2016 1110   GFRNONAA >60 01/20/2021 1040   GFRNONAA 69 09/06/2020 0847   GFRAA 80 09/06/2020 0847     No results found.     Assessment & Plan:   1. Lymphedema  No surgery or intervention at this point in time.    I have reviewed my discussion with the patient regarding lymphedema and why it  causes symptoms.  Patient will continue wearing graduated compression stockings class 1 (20-30 mmHg) on a daily basis a prescription was given. The patient is reminded to put the stockings on first thing in the morning and removing them in the evening. The patient is instructed specifically not to sleep in the stockings.   In addition, behavioral modification throughout the day will be continued.  This will include frequent elevation (such as in a recliner), use of over the counter pain medications as needed and exercise such as walking.  I have reviewed systemic causes for chronic edema such as liver, kidney and cardiac etiologies and there does not appear to be any significant changes in these organ systems over the past year.  The patient is under the impression that these organ systems are all stable  and unchanged.    The patient will continue aggressive use of the  lymph pump.  This will continue to improve the edema control and prevent sequela such as ulcers and infections.   The patient will follow-up with me on an annual basis.    2. Mixed hyperlipidemia Continue statin as ordered and reviewed, no changes at this time   3. Primary hypertension Continue antihypertensive medications as already ordered, these medications have been reviewed and there are no changes at this time.    Current Outpatient Medications on File Prior to Visit  Medication Sig Dispense Refill   amLODipine (NORVASC) 2.5 MG tablet Take 1 tablet (2.5 mg total) by mouth daily. 90 tablet 3   aspirin EC 81 MG tablet Take 1 tablet (81 mg total) by mouth every morning. Resume 4 days post-op     atorvastatin (LIPITOR) 10 MG tablet Take 1 tablet (10 mg total) by mouth at bedtime. 90 tablet 3   carvedilol (COREG) 12.5 MG tablet Take 1 tablet (12.5 mg total) by mouth 2 (two) times daily with a meal. 180 tablet 3   gabapentin (NEURONTIN) 600 MG tablet Take 1 tablet (600 mg total) by mouth 3 (three) times daily. 270 tablet 3   ketoconazole (NIZORAL) 2 % cream APPLY TO AFFECTED AREA TWICE A DAY 45 g 3   losartan-hydrochlorothiazide (  HYZAAR) 100-25 MG tablet Take 1 tablet by mouth daily. 90 tablet 3   meloxicam (MOBIC) 15 MG tablet meloxicam 15 mg tablet  TAKE 1 TABLET BY MOUTH EVERY DAY AS NEEDED WITH MEALS     metFORMIN (GLUCOPHAGE-XR) 500 MG 24 hr tablet TAKE 2 TABLETS BY MOUTH EVERY DAY WITH BREAKFAST 180 tablet 3   Multiple Vitamin (MULTIVITAMIN) tablet Take 1 tablet by mouth daily.     pantoprazole (PROTONIX) 20 MG tablet Take 1 tablet (20 mg total) by mouth every morning. One hour before breakfast 90 tablet 1   sildenafil (VIAGRA) 100 MG tablet Take 1 tablet (100 mg total) by mouth daily as needed for erectile dysfunction. 30 tablet 3   VITAMIN D, ERGOCALCIFEROL, PO Take 5,000 Units by mouth daily.     No current  facility-administered medications on file prior to visit.    There are no Patient Instructions on file for this visit. No follow-ups on file.   Kris Hartmann, NP

## 2021-10-23 ENCOUNTER — Other Ambulatory Visit: Payer: Self-pay | Admitting: Urology

## 2021-10-23 DIAGNOSIS — N529 Male erectile dysfunction, unspecified: Secondary | ICD-10-CM

## 2021-11-07 ENCOUNTER — Encounter: Payer: Self-pay | Admitting: Family Medicine

## 2021-11-11 ENCOUNTER — Other Ambulatory Visit: Payer: Self-pay | Admitting: Physician Assistant

## 2021-11-11 DIAGNOSIS — R21 Rash and other nonspecific skin eruption: Secondary | ICD-10-CM

## 2021-11-11 DIAGNOSIS — E782 Mixed hyperlipidemia: Secondary | ICD-10-CM

## 2021-11-11 DIAGNOSIS — E1129 Type 2 diabetes mellitus with other diabetic kidney complication: Secondary | ICD-10-CM

## 2021-11-11 DIAGNOSIS — I10 Essential (primary) hypertension: Secondary | ICD-10-CM

## 2021-11-11 MED ORDER — LOSARTAN POTASSIUM-HCTZ 100-25 MG PO TABS: 1.0000 | ORAL_TABLET | Freq: Every day | ORAL | 0 refills | Status: DC

## 2021-11-11 MED ORDER — KETOCONAZOLE 2 % EX CREA: TOPICAL_CREAM | Freq: Two times a day (BID) | CUTANEOUS | 1 refills | Status: DC

## 2021-11-11 MED ORDER — ATORVASTATIN CALCIUM 10 MG PO TABS: 10.0000 mg | ORAL_TABLET | Freq: Every day | ORAL | 0 refills | Status: DC

## 2021-11-11 MED ORDER — METFORMIN HCL ER 500 MG PO TB24: ORAL_TABLET | ORAL | 0 refills | Status: DC

## 2021-11-11 NOTE — Progress Notes (Signed)
I have provided a 3 month supply of his requested medications He needs to come in for an apt in the next 4-8 weeks for lab work and A1c to make sure his diabetes is being appropriately managed Please schedule at earliest convenience.

## 2021-12-01 ENCOUNTER — Encounter: Payer: Self-pay | Admitting: Podiatry

## 2021-12-01 ENCOUNTER — Other Ambulatory Visit: Payer: Self-pay

## 2021-12-01 ENCOUNTER — Ambulatory Visit: Payer: BC Managed Care – PPO | Admitting: Podiatry

## 2021-12-01 DIAGNOSIS — E1142 Type 2 diabetes mellitus with diabetic polyneuropathy: Secondary | ICD-10-CM | POA: Diagnosis not present

## 2021-12-01 MED ORDER — GABAPENTIN 600 MG PO TABS: 600.0000 mg | ORAL_TABLET | Freq: Three times a day (TID) | ORAL | 3 refills | Status: DC

## 2021-12-01 NOTE — Progress Notes (Signed)
He presents today for follow-up of his diabetic foot.  He also has run out of gabapentin and would like a refill.  He denies any problems.  States that his big toes will go numb occasionally.  Objective: Vital signs stable oriented x3 pulses are palpable neurologic sensorium slightly diminished to the toes but muscle strength is normal and symmetrical bilateral.  Cutaneous evaluation demonstrates supple well-hydrated cutis no erythema edema cellulitis drainage or odor.  Assessment: Diabetic peripheral neuropathy noncomplicated.  Plan: Follow-up with him in a year wrote another prescription for gabapentin 600 mg 3 times a day for 1 year.

## 2021-12-25 ENCOUNTER — Ambulatory Visit (INDEPENDENT_AMBULATORY_CARE_PROVIDER_SITE_OTHER): Payer: BC Managed Care – PPO | Admitting: Internal Medicine

## 2021-12-25 ENCOUNTER — Encounter: Payer: Self-pay | Admitting: Internal Medicine

## 2021-12-25 ENCOUNTER — Other Ambulatory Visit: Payer: Self-pay

## 2021-12-25 VITALS — BP 126/82 | HR 89 | Temp 98.2°F | Resp 16 | Ht 70.0 in | Wt 284.9 lb

## 2021-12-25 DIAGNOSIS — Z0000 Encounter for general adult medical examination without abnormal findings: Secondary | ICD-10-CM | POA: Diagnosis not present

## 2021-12-25 NOTE — Progress Notes (Signed)
Name: Marc Stevens   MRN: [id number]    DOB: [birth year]   Date:12/25/2021       Progress Note  Subjective  Chief Complaint  Chief Complaint  Patient presents with   Annual Exam    HPI  Patient presents for annual CPE.  IPSS Questionnaire (AUA-7): Over the past month   1)  How often have you had a sensation of not emptying your bladder completely after you finish urinating?  0 - Not at all  2)  How often have you had to urinate again less than two hours after you finished urinating? 0 - Not at all  3)  How often have you found you stopped and started again several times when you urinated?  1 - Less than 1 time in 5  4) How difficult have you found it to postpone urination?  1 - Less than 1 time in 5  5) How often have you had a weak urinary stream?  2 - Less than half the time  6) How often have you had to push or strain to begin urination?  0 - Not at all  7) How many times did you most typically get up to urinate from the time you went to bed until the time you got up in the morning?  1 - 1 time  Total score:  0-7 mildly symptomatic   8-19 moderately symptomatic   20-35 severely symptomatic     Diet: eats a lot salad, one cheat day a week. Eats eggs, going back to meal planning  Exercise: walks at least 30 minutes a day  Depression: phq 9 is negative Depression screen [facility] 2/9 12/25/2021 03/07/2021 02/14/2021 09/06/2020 06/28/2020  Decreased Interest 1 0 0 1 0  Down, Depressed, Hopeless 0 0 1 0 0  PHQ - 2 Score 1 0 1 1 0  Altered sleeping 0 - 0 - -  Tired, decreased energy 0 - 1 - -  Change in appetite 0 - 0 - -  Feeling bad or failure about yourself  0 - 0 - -  Trouble concentrating 0 - 0 - -  Moving slowly or fidgety/restless 0 - 0 - -  Suicidal thoughts 0 - 0 - -  PHQ-9 Score 1 - 2 - -  Difficult doing work/chores Not difficult at all - Not difficult at all - -  Some recent data might be hidden    Hypertension:  BP Readings from Last 3 Encounters:  12/25/21  126/82  06/27/21 130/78  06/16/21 113/70    Obesity: Wt Readings from Last 3 Encounters:  12/25/21 284 lb 14.4 oz (129.2 kg)  06/27/21 267 lb 6.4 oz (121.3 kg)  06/16/21 271 lb (122.9 kg)   BMI Readings from Last 3 Encounters:  12/25/21 40.88 kg/m  06/27/21 37.29 kg/m  06/16/21 37.80 kg/m     Lipids:  Lab Results  Component Value Date   CHOL 141 09/06/2020   CHOL 139 06/28/2020   CHOL 133 12/29/2019   Lab Results  Component Value Date   HDL 58 09/06/2020   HDL 60 06/28/2020   HDL 42 12/29/2019   Lab Results  Component Value Date   LDLCALC 65 09/06/2020   LDLCALC 64 06/28/2020   LDLCALC 70 12/29/2019   Lab Results  Component Value Date   TRIG 92 09/06/2020   TRIG 73 06/28/2020   TRIG 129 12/29/2019   Lab Results  Component Value Date   CHOLHDL 2.4 09/06/2020   CHOLHDL 2.3 06/28/2020  CHOLHDL 3.2 12/29/2019   No results found for: LDLDIRECT Glucose:  Glucose  Date Value Ref Range Status  04/04/2014 96 65 - 99 mg/dL Final   Glucose, Bld  Date Value Ref Range Status  01/20/2021 163 (H) 70 - 99 mg/dL Final    Comment:    Glucose reference range applies only to samples taken after fasting for at least 8 hours.  09/06/2020 111 (H) 65 - 99 mg/dL Final    Comment:    .            Fasting reference interval . For someone without known diabetes, a glucose value between 100 and 125 mg/dL is consistent with prediabetes and should be confirmed with a follow-up test. .   06/28/2020 96 65 - 99 mg/dL Final    Comment:    .            Fasting reference interval .     Flowsheet Row Video Visit from 03/07/2021 in [facility]  AUDIT-C Score 0       Single STD testing and prevention (HIV/chl/gon/syphilis):  no Hep C Screening: negative 3/21 Skin cancer: Discussed monitoring for atypical lesions Colorectal cancer: colonoscopy 02/2018, repeat in 5 years Prostate cancer screening:  yes 0.86 8/22   Lung cancer:  Low Dose CT Chest  recommended if Age 79-80 years, 30 pack-year currently smoking OR have quit w/in 15years. Patient  no Quit smoking 16 years ago  AAA: The USPSTF recommends one-time screening with ultrasonography in men ages 26 to 92 years who have ever smoked. Patient:  not applicable- will qualify at age 55  ECG:  4/22  Vaccines:   HPV: n/a Tdap: 2019 Shingrix: information given and discussed  Pneumonia: n/a Flu: politely declines COVID-19: 5/21, 6/21  Advanced Care Planning: A voluntary discussion about advance care planning including the explanation and discussion of advance directives.  Discussed health care proxy and Living will, and the patient was able to identify a health care proxy as sister and niece, Randa Ngo and Lucienne Capers.  Patient does have a living will.   Patient Active Problem List   Diagnosis Date Noted   Neutropenia, unspecified type ([place]) 02/14/2021   Benign essential hypertension 11/07/2019   Proteinuria 11/07/2019   Type 2 diabetes mellitus with other diabetic kidney complication ([place]) 79/89/2119   Lymphedema 06/16/2018   Arthritis of hand 12/07/2017   Prediabetes 08/13/2017   Mitral regurgitation 01/17/2016   LVH (left ventricular hypertrophy) 01/01/2016   Low testosterone 05/14/2015   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   Vitamin D deficiency 04/26/2015   Beta-thalassemia ([place]) 04/26/2015   Class 2 severe obesity with serious comorbidity and body mass index (BMI) of 37.0 to 37.9 in adult [facility])    ED (erectile dysfunction)    Hyperlipidemia    OSA (obstructive sleep apnea)    HTN (hypertension) 04/18/2012    Past Surgical History:  Procedure Laterality Date   COLONOSCOPY WITH PROPOFOL N/A 02/18/2018   Procedure: COLONOSCOPY WITH PROPOFOL;  Surgeon: Jonathon Bellows, MD;  Location: [facility] ENDOSCOPY;  Service: Gastroenterology;  Laterality: N/A;   DOPPLER ECHOCARDIOGRAPHY  2013   left kene arthroscopy      LUMBAR LAMINECTOMY/DECOMPRESSION  MICRODISCECTOMY N/A 01/30/2015   Procedure: MICRO LUMBAR DECOMPRESSION, BILATERAL FORAMINOTOMIES, MICRODISCECTOMY LUMBAR FOUR TO LUMBAR FIVE;  Surgeon: Susa Day, MD;  Location: [facility] ORS;  Service: Orthopedics;  Laterality: N/A;   right knee surgery     ROTATOR CUFF REPAIR  2007  Left   SHOULDER OPEN ROTATOR CUFF REPAIR Right 08/08/2015   Procedure: RIGHT SHOULDER MINI OPEN ROTATOR CUFF REPAIR SUBACROMIAL DECOMPRESSION ;  Surgeon: Susa Day, MD;  Location: [facility] ORS;  Service: Orthopedics;  Laterality: Right;    Family History  Problem Relation Age of Onset   Dementia Mother    Hypertension Mother    Cancer Father        colon   Heart disease Father    Congestive Heart Failure Father    Hypertension Sister    Dementia Maternal Grandmother    Kidney disease Neg Hx    Prostate cancer Neg Hx    COPD Neg Hx    Diabetes Neg Hx    Stroke Neg Hx    Kidney cancer Neg Hx    Bladder Cancer Neg Hx     Social History   Socioeconomic History   Marital status: Single    Spouse name: Not on file   Number of children: Not on file   Years of education: Not on file   Highest education level: Not on file  Occupational History    Employer: [employer]    Employer: [employer]  Tobacco Use   Smoking status: Former    Packs/day: 0.50    Years: 30.00    Pack years: 15.00    Types: Cigarettes    Quit date: 10/19/2004    Years since quitting: 17.1   Smokeless tobacco: Never  Vaping Use   Vaping Use: Never used  Substance and Sexual Activity   Alcohol use: Yes    Alcohol/week: 4.0 standard drinks    Types: 4 Shots of liquor per week    Comment: occasional   Drug use: No   Sexual activity: Yes    Birth control/protection: None  Other Topics Concern   Not on file  Social History Narrative   Not on file   Social Determinants of Health   Financial Resource Strain: Not on file  Food Insecurity: Not on file  Transportation Needs: Not on file  Physical Activity: Not on file  Stress: Not on  file  Social Connections: Not on file  Intimate Partner Violence: Not on file     Current Outpatient Medications:    amLODipine (NORVASC) 2.5 MG tablet, Take 1 tablet (2.5 mg total) by mouth daily., Disp: 90 tablet, Rfl: 3   aspirin EC 81 MG tablet, Take 1 tablet (81 mg total) by mouth every morning. Resume 4 days post-op, Disp: , Rfl:    atorvastatin (LIPITOR) 10 MG tablet, Take 1 tablet (10 mg total) by mouth at bedtime., Disp: 90 tablet, Rfl: 0   carvedilol (COREG) 12.5 MG tablet, Take 1 tablet (12.5 mg total) by mouth 2 (two) times daily with a meal., Disp: 180 tablet, Rfl: 3   gabapentin (NEURONTIN) 600 MG tablet, Take 1 tablet (600 mg total) by mouth 3 (three) times daily., Disp: 270 tablet, Rfl: 3   gabapentin (NEURONTIN) 600 MG tablet, Take 1 tablet (600 mg total) by mouth 3 (three) times daily., Disp: 270 tablet, Rfl: 3   ketoconazole (NIZORAL) 2 % cream, Apply topically 2 (two) times daily., Disp: 30 g, Rfl: 1   losartan-hydrochlorothiazide (HYZAAR) 100-25 MG tablet, Take 1 tablet by mouth daily., Disp: 90 tablet, Rfl: 0   meloxicam (MOBIC) 15 MG tablet, meloxicam 15 mg tablet  TAKE 1 TABLET BY MOUTH EVERY DAY AS NEEDED WITH MEALS, Disp: , Rfl:    metFORMIN (GLUCOPHAGE-XR) 500 MG 24 hr tablet, TAKE 2 TABLETS  BY MOUTH EVERY DAY WITH BREAKFAST, Disp: 180 tablet, Rfl: 0   Multiple Vitamin (MULTIVITAMIN) tablet, Take 1 tablet by mouth daily., Disp: , Rfl:    pantoprazole (PROTONIX) 20 MG tablet, Take 1 tablet (20 mg total) by mouth every morning. One hour before breakfast, Disp: 90 tablet, Rfl: 1   sildenafil (VIAGRA) 100 MG tablet, TAKE 1 TABLET BY MOUTH DAILY AS NEEDED FOR ERECTILE DYSFUNCTION., Disp: 30 tablet, Rfl: 3   VITAMIN D, ERGOCALCIFEROL, PO, Take 5,000 Units by mouth daily., Disp: , Rfl:   No Known Allergies   Review of Systems  Constitutional: Negative.   Respiratory: Negative.    Cardiovascular: Negative.   Gastrointestinal: Negative.   Genitourinary: Negative.      Objective  Vitals:   12/25/21 0849  BP: 126/82  Pulse: 89  Resp: 16  Temp: 98.2 F (36.8 C)  SpO2: 96%  Weight: 284 lb 14.4 oz (129.2 kg)  Height: '5\' 10"'$  (1.778 m)    Body mass index is 40.88 kg/m.  Physical Exam Constitutional:      Appearance: Normal appearance. He is obese.  HENT:     Head: Normocephalic and atraumatic.     Mouth/Throat:     Mouth: Mucous membranes are moist.     Pharynx: Oropharynx is clear.  Eyes:     Conjunctiva/sclera: Conjunctivae normal.  Cardiovascular:     Rate and Rhythm: Normal rate and regular rhythm.  Pulmonary:     Effort: Pulmonary effort is normal.     Breath sounds: Normal breath sounds.  Abdominal:     General: There is no distension.     Palpations: Abdomen is soft.     Tenderness: There is no abdominal tenderness.  Musculoskeletal:     Right lower leg: No edema.     Left lower leg: No edema.  Skin:    General: Skin is warm and dry.  Neurological:     General: No focal deficit present.     Mental Status: He is alert. Mental status is at baseline.  Psychiatric:        Mood and Affect: Mood normal.        Behavior: Behavior normal.     No results found for this or any previous visit (from the past 2160 hour(s)).   Fall Risk: Fall Risk  03/07/2021 02/14/2021 09/06/2020 06/28/2020 12/29/2019  Falls in the past year? 0 0 0 0 0  Number falls in past yr: 0 0 0 0 0  Injury with Fall? 0 0 0 0 0  Follow up Falls evaluation completed - - Falls evaluation completed -    Assessment & Plan  1. Routine general medical examination at a health care facility: Due for routine labs, follow up in 3 months for regular medical follow up.   - CBC w/Diff/Platelet - COMPLETE METABOLIC PANEL WITH GFR - Lipid Profile - HgB A1c  -Prostate cancer screening and PSA options (with potential risks and benefits of testing vs not testing) were discussed along with recent recs/guidelines. -USPSTF grade A and B recommendations reviewed with  patient; age-appropriate recommendations, preventive care, screening tests, etc discussed and encouraged; healthy living encouraged; see AVS for patient education given to patient -Discussed importance of 150 minutes of physical activity weekly, eat two servings of fish weekly, eat one serving of tree nuts ( cashews, pistachios, pecans, almonds.Marland Kitchen) every other day, eat 6 servings of fruit/vegetables daily and drink plenty of water and avoid sweet beverages.  -Reviewed Health Maintenance: yes

## 2021-12-25 NOTE — Patient Instructions (Addendum)
It was great seeing you today!  Plan discussed at today's visit: -Blood work ordered today, results will be uploaded to [place] to find out which DME company they will cover, may need a new titration study done  Follow up in: 3 months   Take care and let us know if you have any questions or concerns prior to your next visit.  Dr. Rosana Berger  Recombinant Zoster (Shingles) Vaccine: What You Need to Know 1. Why get vaccinated? Recombinant zoster (shingles) vaccine can prevent shingles. Shingles (also called herpes zoster, or just zoster) is a painful skin rash, usually with blisters. In addition to the rash, shingles can cause fever, headache, chills, or upset stomach. Rarely, shingles can lead to complications such as pneumonia, hearing problems, blindness, brain inflammation (encephalitis), or death. The risk of shingles increases with age. The most common complication of shingles is long-term nerve pain called postherpetic neuralgia (PHN). PHN occurs in the areas where the shingles rash was and can last for months or years after the rash goes away. The pain from PHN can be severe and debilitating. The risk of PHN increases with age. An older adult with shingles is more likely to develop PHN and have longer lasting and more severe pain than a younger person. People with weakened immune systems also have a higher risk of getting shingles and complications from the disease. Shingles is caused by varicella-zoster virus, the same virus that causes chickenpox. After you have chickenpox, the virus stays in your body and can cause shingles later in life. Shingles cannot be passed from one person to another, but the virus that causes shingles can spread and cause chickenpox in someone who has never had chickenpox or has never received chickenpox vaccine. 2. Recombinant shingles vaccine Recombinant shingles vaccine provides strong protection against shingles. By preventing shingles,  recombinant shingles vaccine also protects against PHN and other complications. Recombinant shingles vaccine is recommended for: Adults 85 years and older Adults 19 years and older who have a weakened immune system because of disease or treatments Shingles vaccine is given as a two-dose series. For most people, the second dose should be given 2 to 6 months after the first dose. Some people who have or will have a weakened immune system can get the second dose 1 to 2 months after the first dose. Ask your health care provider for guidance. People who have had shingles in the past and people who have received varicella (chickenpox) vaccine are recommended to get recombinant shingles vaccine. The vaccine is also recommended for people who have already gotten another type of shingles vaccine, the live shingles vaccine. There is no live virus in recombinant shingles vaccine. Shingles vaccine may be given at the same time as other vaccines. 3. Talk with your health care provider Tell your vaccination provider if the person getting the vaccine: Has had an allergic reaction after a previous dose of recombinant shingles vaccine, or has any severe, life-threatening allergies Is currently experiencing an episode of shingles Is pregnant In some cases, your health care provider may decide to postpone shingles vaccination until a future visit. People with minor illnesses, such as a cold, may be vaccinated. People who are moderately or severely ill should usually wait until they recover before getting recombinant shingles vaccine. Your health care provider can give you more information. 4. Risks of a vaccine reaction A sore arm with mild or moderate pain is very common after recombinant shingles vaccine. Redness and swelling can also  happen at the site of the injection. Tiredness, muscle pain, headache, shivering, fever, stomach pain, and nausea are common after recombinant shingles vaccine. These side effects may  temporarily prevent a vaccinated person from doing regular activities. Symptoms usually go away on their own in 2 to 3 days. You should still get the second dose of recombinant shingles vaccine even if you had one of these reactions after the first dose. Guillain-Barr syndrome (GBS), a serious nervous system disorder, has been reported very rarely after recombinant zoster vaccine. People sometimes faint after medical procedures, including vaccination. Tell your provider if you feel dizzy or have vision changes or ringing in the ears. As with any medicine, there is a very remote chance of a vaccine causing a severe allergic reaction, other serious injury, or death. 5. What if there is a serious problem? An allergic reaction could occur after the vaccinated person leaves the clinic. If you see signs of a severe allergic reaction (hives, swelling of the face and throat, difficulty breathing, a fast heartbeat, dizziness, or weakness), call 9-1-1 and get the person to the nearest hospital. For other signs that concern you, call your health care provider. Adverse reactions should be reported to the Vaccine Adverse Event Reporting System (VAERS). Your health care provider will usually file this report, or you can do it yourself. Visit the VAERS website at www.vaers.SamedayNews.es or call 865-764-1897. VAERS is only for reporting reactions, and VAERS staff members do not give medical advice. 6. How can I learn more? Ask your health care provider. Call your local or state health department. Visit the website of the Food and Drug Administration (FDA) for vaccine package inserts and additional information at http://lopez-wang.org/. Contact the Centers for Disease Control and Prevention (CDC): Call 930-565-2329 (1-800-CDC-INFO) or Visit CDC's website at http://hunter.com/. Vaccine Information Statement Recombinant Zoster Vaccine (11/22/2020) This information is not intended to replace advice  given to you by your health care provider. Make sure you discuss any questions you have with your health care provider. Document Revised: 06/20/2021 Document Reviewed: 12/06/2020 Elsevier Patient Education  2022 [place] Maintenance, Male Adopting a healthy lifestyle and getting preventive care are important in promoting health and wellness. Ask your health care provider about: The right schedule for you to have regular tests and exams. Things you can do on your own to prevent diseases and keep yourself healthy. What should I know about diet, weight, and exercise? Eat a healthy diet  Eat a diet that includes plenty of vegetables, fruits, low-fat dairy products, and lean protein. Do not eat a lot of foods that are high in solid fats, added sugars, or sodium. Maintain a healthy weight Body mass index (BMI) is a measurement that can be used to identify possible weight problems. It estimates body fat based on height and weight. Your health care provider can help determine your BMI and help you achieve or maintain a healthy weight. Get regular exercise Get regular exercise. This is one of the most important things you can do for your health. Most adults should: Exercise for at least 150 minutes each week. The exercise should increase your heart rate and make you sweat (moderate-intensity exercise). Do strengthening exercises at least twice a week. This is in addition to the moderate-intensity exercise. Spend less time sitting. Even light physical activity can be beneficial. Watch cholesterol and blood lipids Have your blood tested for lipids and cholesterol at 60 years of age, then have this test every 5 years. You may need to  have your cholesterol levels checked more often if: Your lipid or cholesterol levels are high. You are older than 60 years of age. You are at high risk for heart disease. What should I know about cancer screening? Many types of cancers can be detected early  and may often be prevented. Depending on your health history and family history, you may need to have cancer screening at various ages. This may include screening for: Colorectal cancer. Prostate cancer. Skin cancer. Lung cancer. What should I know about heart disease, diabetes, and high blood pressure? Blood pressure and heart disease High blood pressure causes heart disease and increases the risk of stroke. This is more likely to develop in people who have high blood pressure readings or are overweight. Talk with your health care provider about your target blood pressure readings. Have your blood pressure checked: Every 3-5 years if you are 27-60 years of age. Every year if you are 12 years old or older. If you are between the ages of 39 and 50 and are a current or former smoker, ask your health care provider if you should have a one-time screening for abdominal aortic aneurysm (AAA). Diabetes Have regular diabetes screenings. This checks your fasting blood sugar level. Have the screening done: Once every three years after age 40 if you are at a normal weight and have a low risk for diabetes. More often and at a younger age if you are overweight or have a high risk for diabetes. What should I know about preventing infection? Hepatitis B If you have a higher risk for hepatitis B, you should be screened for this virus. Talk with your health care provider to find out if you are at risk for hepatitis B infection. Hepatitis C Blood testing is recommended for: Everyone born from [birth year] through 1964. Anyone with known risk factors for hepatitis C. Sexually transmitted infections (STIs) You should be screened each year for STIs, including gonorrhea and chlamydia, if: You are sexually active and are younger than 60 years of age. You are older than 60 years of age and your health care provider tells you that you are at risk for this type of infection. Your sexual activity has changed since you were  last screened, and you are at increased risk for chlamydia or gonorrhea. Ask your health care provider if you are at risk. Ask your health care provider about whether you are at high risk for HIV. Your health care provider may recommend a prescription medicine to help prevent HIV infection. If you choose to take medicine to prevent HIV, you should first get tested for HIV. You should then be tested every 3 months for as long as you are taking the medicine. Follow these instructions at home: Alcohol use Do not drink alcohol if your health care provider tells you not to drink. If you drink alcohol: Limit how much you have to 0-2 drinks a day. Know how much alcohol is in your drink. In the [place] one drink equals one 12 oz bottle of beer (355 mL), one 5 oz glass of wine (148 mL), or one 1 oz glass of hard liquor (44 mL). Lifestyle Do not use any products that contain nicotine or tobacco. These products include cigarettes, chewing tobacco, and vaping devices, such as e-cigarettes. If you need help quitting, ask your health care provider. Do not use street drugs. Do not share needles. Ask your health care provider for help if you need support or information about quitting drugs. General instructions  Schedule regular health, dental, and eye exams. Stay current with your vaccines. Tell your health care provider if: You often feel depressed. You have ever been abused or do not feel safe at home. Summary Adopting a healthy lifestyle and getting preventive care are important in promoting health and wellness. Follow your health care provider's instructions about healthy diet, exercising, and getting tested or screened for diseases. Follow your health care provider's instructions on monitoring your cholesterol and blood pressure. This information is not intended to replace advice given to you by your health care provider. Make sure you discuss any questions you have with your health care  provider. Document Revised: 02/24/2021 Document Reviewed: 02/24/2021 Elsevier Patient Education  [place].

## 2021-12-26 LAB — CBC WITH DIFFERENTIAL/PLATELET
Absolute Monocytes: 536 cells/uL (ref 200–950)
Basophils Absolute: 42 cells/uL (ref 0–200)
Basophils Relative: 0.8 %
Eosinophils Absolute: 234 cells/uL (ref 15–500)
Eosinophils Relative: 4.5 %
HCT: 46.8 % (ref 38.5–50.0)
Hemoglobin: 14.4 g/dL (ref 13.2–17.1)
Lymphs Abs: 1466 cells/uL (ref 850–3900)
MCH: 19.9 pg — ABNORMAL LOW (ref 27.0–33.0)
MCHC: 30.8 g/dL — ABNORMAL LOW (ref 32.0–36.0)
MCV: 64.8 fL — ABNORMAL LOW (ref 80.0–100.0)
MPV: 10.3 fL (ref 7.5–12.5)
Monocytes Relative: 10.3 %
Neutro Abs: 2922 cells/uL (ref 1500–7800)
Neutrophils Relative %: 56.2 %
Platelets: 281 10*3/uL (ref 140–400)
RBC: 7.22 10*6/uL — ABNORMAL HIGH (ref 4.20–5.80)
RDW: 18.6 % — ABNORMAL HIGH (ref 11.0–15.0)
Total Lymphocyte: 28.2 %
WBC: 5.2 10*3/uL (ref 3.8–10.8)

## 2021-12-26 LAB — COMPLETE METABOLIC PANEL WITH GFR
AG Ratio: 1.8 (calc) (ref 1.0–2.5)
ALT: 22 U/L (ref 9–46)
AST: 19 U/L (ref 10–35)
Albumin: 4.1 g/dL (ref 3.6–5.1)
Alkaline phosphatase (APISO): 71 U/L (ref 35–144)
BUN: 17 mg/dL (ref 7–25)
CO2: 28 mmol/L (ref 20–32)
Calcium: 9.5 mg/dL (ref 8.6–10.3)
Chloride: 108 mmol/L (ref 98–110)
Creat: 1.07 mg/dL (ref 0.70–1.30)
Globulin: 2.3 g/dL (calc) (ref 1.9–3.7)
Glucose, Bld: 102 mg/dL — ABNORMAL HIGH (ref 65–99)
Potassium: 4.4 mmol/L (ref 3.5–5.3)
Sodium: 146 mmol/L (ref 135–146)
Total Bilirubin: 0.4 mg/dL (ref 0.2–1.2)
Total Protein: 6.4 g/dL (ref 6.1–8.1)
eGFR: 80 mL/min/{1.73_m2} (ref >=60)

## 2021-12-26 LAB — HEMOGLOBIN A1C
Hgb A1c MFr Bld: 6.1 % of total Hgb — ABNORMAL HIGH (ref <5.7)
Mean Plasma Glucose: 128 mg/dL
eAG (mmol/L): 7.1 mmol/L

## 2021-12-26 LAB — LIPID PANEL
Cholesterol: 131 mg/dL (ref <200)
HDL: 50 mg/dL (ref >=40)
LDL Cholesterol (Calc): 66 mg/dL (calc)
Non-HDL Cholesterol (Calc): 81 mg/dL (calc) (ref <130)
Total CHOL/HDL Ratio: 2.6 (calc) (ref <5.0)
Triglycerides: 69 mg/dL (ref <150)

## 2022-01-28 ENCOUNTER — Ambulatory Visit: Payer: Self-pay | Admitting: *Deleted

## 2022-01-28 NOTE — Telephone Encounter (Signed)
?  Chief Complaint: Swollen area at neck ?Symptoms: Swollen area, size of quarter under chin, tender to touch. Towards left side of neck. ?Frequency: Yesterday ?Pertinent Negatives: Patient denies Cold, congestion, recent illness, SOB, difficulty swallowing. ?Disposition: '[]'$ ED /'[]'$ Urgent Care (no appt availability in office) / '[x]'$ Appointment(In office/virtual)/ '[]'$  [facility]/ '[]'$ Home Care/ '[]'$ Refused Recommended Disposition /'[]'$ [facility]/ '[]'$  Follow-up with PCP ?Additional Notes: CAre advise provided, pt verbalizes understanding. ?Reason for Disposition ? [1] Swelling is painful to touch AND [2] no fever ? ?Answer Assessment - Initial Assessment Questions ?1. APPEARANCE of SWELLING: "What does it look like?" (e.g., lymph node, insect bite, mole) ?    Swelling "Inside  neck" ?2. SIZE: "How large is the swelling?" (e.g., inches, cm; or compare to size of pinhead, tip of pen, eraser, coin, pea, grape, ping pong ball)  ?    Size of quarter ?3. LOCATION: "Where is the swelling located?" ?    Neck, left of chin ?4. ONSET: "When did the swelling start?" ?    Yesterday ?5. PAIN: "Is it painful?" If Yes, ask: "How much?" ?    "Sore to touch" ?6. ITCH: "Does it itch?" If Yes, ask: "How much?" ?    no ?7. CAUSE: "What do you think caused the swelling?" ?    "Had something like this years ago" ?8. OTHER SYMPTOMS: "Do you have any other symptoms?" (e.g., fever) ?    no ? ?Protocols used: Skin Lump or Localized Swelling-A-AH ? ?

## 2022-01-29 ENCOUNTER — Other Ambulatory Visit: Payer: Self-pay | Admitting: Family Medicine

## 2022-01-29 ENCOUNTER — Ambulatory Visit: Payer: BC Managed Care – PPO | Admitting: Family Medicine

## 2022-01-29 ENCOUNTER — Encounter: Payer: Self-pay | Admitting: Family Medicine

## 2022-01-29 ENCOUNTER — Ambulatory Visit
Admission: RE | Admit: 2022-01-29 | Discharge: 2022-01-29 | Disposition: A | Discharge status: Home / Self Care | Payer: BC Managed Care – PPO | Source: Ambulatory Visit | Attending: Family Medicine | Admitting: Family Medicine

## 2022-01-29 VITALS — BP 126/68 | HR 81 | Temp 97.5°F | Resp 16 | Ht 70.0 in | Wt 282.1 lb

## 2022-01-29 DIAGNOSIS — R221 Localized swelling, mass and lump, neck: Secondary | ICD-10-CM | POA: Insufficient documentation

## 2022-01-29 DIAGNOSIS — R070 Pain in throat: Secondary | ICD-10-CM | POA: Diagnosis not present

## 2022-01-29 DIAGNOSIS — K115 Sialolithiasis: Secondary | ICD-10-CM | POA: Diagnosis not present

## 2022-01-29 DIAGNOSIS — J029 Acute pharyngitis, unspecified: Secondary | ICD-10-CM | POA: Diagnosis not present

## 2022-01-29 LAB — POCT RAPID STREP A (OFFICE): Rapid Strep A Screen: NEGATIVE

## 2022-01-29 NOTE — Patient Instructions (Signed)
Follow these instructions every few hours: ?Suck on a lemon candy to stimulate the flow of saliva. ?Put a warm compress over the gland. ?Gently massage the gland. ? ?Stay hydrated and push ample fluids ? ?If worsening let us know we will put in a referral to ENT specialist ? ? ?Salivary Stone ?A salivary stone is a mineral deposit that builds up in the ducts that drain your salivary glands. Most salivary stones are made of calcium. When a stone forms, saliva can back up into the gland and cause painful swelling. ?Your salivary glands are the glands that produce saliva. You have six major salivary glands. Each gland has a duct that carries saliva into your mouth. Saliva keeps your mouth moist and breaks down the food that you eat. It also helps prevent tooth decay. ?Two salivary glands are located just in front of your ears (parotid). The ducts for these glands open up inside your cheeks, near your back teeth. You also have two glands under your tongue (sublingual) and two glands under your jaw (submandibular). The ducts for these glands open under your tongue. A stone can form in any salivary gland. The most common place for a salivary stone to develop is in a submandibular salivary gland. ?What are the causes? ?Salivary stones may be caused by any condition that reduces the flow of saliva. It is not known why some people form stones. ?What increases the risk? ?You are more likely to develop this condition if: ?You are male. ?You do not drink enough water. ?You smoke. ?You have any of the following: ?High blood pressure. ?Gout. ?Diabetes. ?What are the signs or symptoms? ?The main sign of a salivary gland stone is sudden swelling of a salivary gland when eating. This usually happens under the jaw on one side. Other signs and symptoms may include: ?Swelling of the cheek or under the tongue when eating. ?Pain in the swollen area. ?Trouble chewing or swallowing. ?Swelling that goes down after eating. ?How is this  diagnosed? ?This condition may be diagnosed based on: ?Your signs and symptoms. ?A physical exam. In many cases, your health care provider will be able to feel the stone in a duct inside your mouth. ?Imaging studies, such as: ?X-rays. ?Ultrasound. ?CT scan. ?MRI. ?You may need to see an ear, nose, and throat specialist (ENT or otolaryngologist) for diagnosis and treatment. ?How is this treated? ?Treatment for this condition depends on the size of the stone. ?A small stone that is not causing symptoms may be treated with home care. ?For a stone that is large enough to cause symptoms, the treatment options may include: ?Probing and widening of the duct to allow the stone to pass. ?Inserting a thin, flexible scope (endoscope) into the duct to locate and remove the stone. ?Breaking up the stone with sound waves. ?Removing the entire salivary gland. ?Follow these instructions at home: ?To relieve discomfort ?Follow these instructions every few hours: ?Suck on a lemon candy to stimulate the flow of saliva. ?Put a warm compress over the gland. ?Gently massage the gland. ?General instructions ?Drink enough fluid to keep your urine pale yellow. ?Do not use any products that contain nicotine or tobacco, such as cigarettes and e-cigarettes. If you need help quitting, ask your health care provider. ?Keep all follow-up visits as told by your health care provider. This is important. ?Contact a health care provider if: ?You have pain and swelling in your face, jaw, or mouth after eating. ?You have persistent swelling in any of  these places: ?In front of your ear. ?Under your jaw. ?Inside your mouth. ?Get help right away if: ?You have pain and swelling in your face, jaw, or mouth, and this is getting worse. ?Your pain and swelling make it hard to swallow or breathe. ?Summary ?A salivary stone is a mineral deposit that builds up in the ducts that drain your salivary glands. ?When a stone forms, saliva can back up into the gland and  cause painful swelling. ?Salivary stones may be caused by any condition that reduces the flow of saliva. ?Treatment for this condition depends on the size of the stone. ?This information is not intended to replace advice given to you by your health care provider. Make sure you discuss any questions you have with your health care provider. ?Document Revised: 11/15/2017 Document Reviewed: 11/15/2017 ?Elsevier Patient Education ? [place]. ? ?

## 2022-01-29 NOTE — Progress Notes (Signed)
? ? ?[name], male    DOB: [birth year], 61 y.o.   MRN: [id number] ? ?PCP: Delsa Grana, PA-C ? ?Chief Complaint  ?Patient presents with  ? Cyst  ?  Pt has a knot on left side of throat he states he ate a fry on Tuesday evening and unsure if that caused the knot. Pt has been having pain ever since and states it hurts to swallow.  ? ? ?Subjective:  ? ?Marc Stevens is a 61 y.o. male, presents to clinic with CC of the following: ? ?HPI  ?Presents to clinic with 2 days of left neck swelling and pain.  Hes had before come and go. ?He noticed 2 nights ago after getting some fries on the way home from work. ?Its mildly tender, enlarged.  He denies any choking, voice change, difficulty swallowing/speaking/breathing ?No sick contacts that he knows of. ?For years this has happened a few times a year, but this is larger than normal - yesterday he felt like it was the size of a baseball  ?He denies fever chills sweats URI sx ? ?Patient Active Problem List  ? Diagnosis Date Noted  ? Neutropenia, unspecified type ([place]) 02/14/2021  ? Benign essential hypertension 11/07/2019  ? Proteinuria 11/07/2019  ? Type 2 diabetes mellitus with other diabetic kidney complication ([place]) 76/22/6333  ? Lymphedema 06/16/2018  ? Arthritis of hand 12/07/2017  ? Prediabetes 08/13/2017  ? Mitral regurgitation 01/17/2016  ? LVH (left ventricular hypertrophy) 01/01/2016  ? Low testosterone 05/14/2015  ? 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  ? Vitamin D deficiency 04/26/2015  ? Beta-thalassemia ([place]) 04/26/2015  ? Class 2 severe obesity with serious comorbidity and body mass index (BMI) of 37.0 to 37.9 in adult [facility])   ? ED (erectile dysfunction)   ? Hyperlipidemia   ? OSA (obstructive sleep apnea)   ? HTN (hypertension) 04/18/2012  ? ? ? ? ?Current Outpatient Medications:  ?  amLODipine (NORVASC) 2.5 MG tablet, Take 1 tablet (2.5 mg total) by mouth daily., Disp: 90 tablet, Rfl: 3 ?  aspirin EC 81 MG  tablet, Take 1 tablet (81 mg total) by mouth every morning. Resume 4 days post-op, Disp: , Rfl:  ?  atorvastatin (LIPITOR) 10 MG tablet, Take 1 tablet (10 mg total) by mouth at bedtime., Disp: 90 tablet, Rfl: 0 ?  carvedilol (COREG) 12.5 MG tablet, Take 1 tablet (12.5 mg total) by mouth 2 (two) times daily with a meal., Disp: 180 tablet, Rfl: 3 ?  gabapentin (NEURONTIN) 600 MG tablet, Take 1 tablet (600 mg total) by mouth 3 (three) times daily., Disp: 270 tablet, Rfl: 3 ?  gabapentin (NEURONTIN) 600 MG tablet, Take 1 tablet (600 mg total) by mouth 3 (three) times daily., Disp: 270 tablet, Rfl: 3 ?  ketoconazole (NIZORAL) 2 % cream, Apply topically 2 (two) times daily., Disp: 30 g, Rfl: 1 ?  losartan-hydrochlorothiazide (HYZAAR) 100-25 MG tablet, Take 1 tablet by mouth daily., Disp: 90 tablet, Rfl: 0 ?  meloxicam (MOBIC) 15 MG tablet, meloxicam 15 mg tablet  TAKE 1 TABLET BY MOUTH EVERY DAY AS NEEDED WITH MEALS, Disp: , Rfl:  ?  metFORMIN (GLUCOPHAGE-XR) 500 MG 24 hr tablet, TAKE 2 TABLETS BY MOUTH EVERY DAY WITH BREAKFAST, Disp: 180 tablet, Rfl: 0 ?  Multiple Vitamin (MULTIVITAMIN) tablet, Take 1 tablet by mouth daily., Disp: , Rfl:  ?  pantoprazole (PROTONIX) 20 MG tablet, Take 1 tablet (20 mg total) by mouth every morning. One hour before  breakfast, Disp: 90 tablet, Rfl: 1 ?  sildenafil (VIAGRA) 100 MG tablet, TAKE 1 TABLET BY MOUTH DAILY AS NEEDED FOR ERECTILE DYSFUNCTION., Disp: 30 tablet, Rfl: 3 ?  VITAMIN D, ERGOCALCIFEROL, PO, Take 5,000 Units by mouth daily., Disp: , Rfl:  ? ? ?No Known Allergies ? ? ?Social History  ? ?Tobacco Use  ? Smoking status: Former  ?  Packs/day: 0.50  ?  Years: 30.00  ?  Pack years: 15.00  ?  Types: Cigarettes  ?  Quit date: 10/19/2004  ?  Years since quitting: 17.2  ? Smokeless tobacco: Never  ?Vaping Use  ? Vaping Use: Never used  ?Substance Use Topics  ? Alcohol use: Yes  ?  Alcohol/week: 4.0 standard drinks  ?  Types: 4 Shots of liquor per week  ?  Comment: occasional  ? Drug  use: No  ?  ? ? ?Chart Review Today: ?I personally reviewed active problem list, medication list, allergies, family history, social history, health maintenance, notes from last encounter, lab results, imaging with the patient/caregiver today. ? ? ?Review of Systems  ?Constitutional: Negative.   ?HENT: Negative.    ?Eyes: Negative.   ?Respiratory: Negative.    ?Cardiovascular: Negative.   ?Gastrointestinal: Negative.   ?Endocrine: Negative.   ?Genitourinary: Negative.   ?Musculoskeletal: Negative.   ?Skin: Negative.   ?Allergic/Immunologic: Negative.   ?Neurological: Negative.   ?Hematological: Negative.   ?Psychiatric/Behavioral: Negative.    ?All other systems reviewed and are negative. ? ?   ?Objective:  ? ?Vitals:  ? 01/29/22 0834  ?BP: 126/68  ?Pulse: 81  ?Resp: 16  ?Temp: (!) 97.5 ?F (36.4 ?C)  ?TempSrc: Oral  ?SpO2: 93%  ?Weight: 282 lb 1.6 oz (128 kg)  ?Height: 5' 10"  (1.778 m)  ?  ?Body mass index is 40.48 kg/m?. ? ?Physical Exam ?Vitals and nursing note reviewed.  ?Constitutional:   ?   General: He is not in acute distress. ?   Appearance: He is obese. He is not ill-appearing, toxic-appearing or diaphoretic.  ?HENT:  ?   Head: Normocephalic and atraumatic.  ?   Right Ear: External ear normal.  ?   Left Ear: External ear normal.  ?   Nose: Congestion present.  ?   Mouth/Throat:  ?   Mouth: Mucous membranes are moist.  ?   Pharynx: Oropharyngeal exudate and posterior oropharyngeal erythema present.  ?Eyes:  ?   Conjunctiva/sclera: Conjunctivae normal.  ?Neck:  ?   Thyroid: No thyroid mass or thyroid tenderness.  ?   Trachea: Trachea and phonation normal.  ?   Comments: Left mid anterior neck with soft nonmobile mass and no surrounding lymphadenopathy  ?Musculoskeletal:  ?   Cervical back: Normal range of motion and neck supple. Edema and crepitus present. No erythema. No pain with movement, spinous process tenderness or muscular tenderness. Normal range of motion.  ?Neurological:  ?   Mental Status: He is  alert.  ?  ? ?Results for orders placed or performed in visit on 12/25/21  ?CBC w/Diff/Platelet  ?Result Value Ref Range  ? WBC 5.2 3.8 - 10.8 Thousand/uL  ? RBC 7.22 (H) 4.20 - 5.80 Million/uL  ? Hemoglobin 14.4 13.2 - 17.1 g/dL  ? HCT 46.8 38.5 - 50.0 %  ? MCV 64.8 (L) 80.0 - 100.0 fL  ? MCH 19.9 (L) 27.0 - 33.0 pg  ? MCHC 30.8 (L) 32.0 - 36.0 g/dL  ? RDW 18.6 (H) 11.0 - 15.0 %  ? Platelets 281 140 -  400 Thousand/uL  ? MPV 10.3 7.5 - 12.5 fL  ? Neutro Abs 2,922 1,500 - 7,800 cells/uL  ? Lymphs Abs 1,466 850 - 3,900 cells/uL  ? Absolute Monocytes 536 200 - 950 cells/uL  ? Eosinophils Absolute 234 15 - 500 cells/uL  ? Basophils Absolute 42 0 - 200 cells/uL  ? Neutrophils Relative % 56.2 %  ? Total Lymphocyte 28.2 %  ? Monocytes Relative 10.3 %  ? Eosinophils Relative 4.5 %  ? Basophils Relative 0.8 %  ? Smear Review    ?COMPLETE METABOLIC PANEL WITH GFR  ?Result Value Ref Range  ? Glucose, Bld 102 (H) 65 - 99 mg/dL  ? BUN 17 7 - 25 mg/dL  ? Creat 1.07 0.70 - 1.30 mg/dL  ? eGFR 80 > OR = 60 mL/min/1.52m  ? BUN/Creatinine Ratio NOT APPLICABLE 6 - 22 (calc)  ? Sodium 146 135 - 146 mmol/L  ? Potassium 4.4 3.5 - 5.3 mmol/L  ? Chloride 108 98 - 110 mmol/L  ? CO2 28 20 - 32 mmol/L  ? Calcium 9.5 8.6 - 10.3 mg/dL  ? Total Protein 6.4 6.1 - 8.1 g/dL  ? Albumin 4.1 3.6 - 5.1 g/dL  ? Globulin 2.3 1.9 - 3.7 g/dL (calc)  ? AG Ratio 1.8 1.0 - 2.5 (calc)  ? Total Bilirubin 0.4 0.2 - 1.2 mg/dL  ? Alkaline phosphatase (APISO) 71 35 - 144 U/L  ? AST 19 10 - 35 U/L  ? ALT 22 9 - 46 U/L  ?Lipid Profile  ?Result Value Ref Range  ? Cholesterol 131 <200 mg/dL  ? HDL 50 > OR = 40 mg/dL  ? Triglycerides 69 <150 mg/dL  ? LDL Cholesterol (Calc) 66 mg/dL (calc)  ? Total CHOL/HDL Ratio 2.6 <5.0 (calc)  ? Non-HDL Cholesterol (Calc) 81 <130 mg/dL (calc)  ?HgB A1c  ?Result Value Ref Range  ? Hgb A1c MFr Bld 6.1 (H) <5.7 % of total Hgb  ? Mean Plasma Glucose 128 mg/dL  ? eAG (mmol/L) 7.1 mmol/L  ? ?Stat neck soft-tissue [place] and completed  this morning: ?Narrative & Impression  ?CLINICAL DATA:  61year old male with palpable abnormality left ?superior neck discovered 3 days ago. ?  ?EXAM: ?ULTRASOUND OF HEAD/NECK SOFT TISSUES ?  ?TECHNIQUE: ?Ult

## 2022-01-30 ENCOUNTER — Encounter: Payer: Self-pay | Admitting: Family Medicine

## 2022-01-30 DIAGNOSIS — K115 Sialolithiasis: Secondary | ICD-10-CM

## 2022-01-31 ENCOUNTER — Encounter: Payer: Self-pay | Admitting: Family Medicine

## 2022-02-01 LAB — TEST AUTHORIZATION: TEST CODE:: 394

## 2022-02-01 LAB — CULTURE, UPPER RESPIRATORY
MICRO NUMBER:: 13264303
SPECIMEN QUALITY:: ADEQUATE

## 2022-02-06 ENCOUNTER — Other Ambulatory Visit: Payer: Self-pay | Admitting: Physician Assistant

## 2022-02-06 NOTE — Telephone Encounter (Signed)
Requested Prescriptions  ?Pending Prescriptions Disp Refills  ?? atorvastatin (LIPITOR) 10 MG tablet [Pharmacy Med Name: ATORVASTATIN 10 MG TABLET] 90 tablet 0  ?  Sig: TAKE 1 TABLET BY MOUTH EVERYDAY AT BEDTIME  ?  ? Cardiovascular:  Antilipid - Statins Failed - 02/06/2022  2:50 AM  ?  ?  Failed - Lipid Panel in normal range within the last 12 months  ?  Cholesterol, Total  ?Date Value Ref Range Status  ?02/21/2016 137 100 - 199 mg/dL Final  ? ?Cholesterol  ?Date Value Ref Range Status  ?12/25/2021 131 <200 mg/dL Final  ? ?LDL Cholesterol (Calc)  ?Date Value Ref Range Status  ?12/25/2021 66 mg/dL (calc) Final  ?  Comment:  ?  Reference range: <100 ?Marland Kitchen ?Desirable range <100 mg/dL for primary prevention;   ?<70 mg/dL for patients with CHD or diabetic patients  ?with > or = 2 CHD risk factors. ?. ?LDL-C is now calculated using the Martin-Hopkins  ?calculation, which is a validated novel method providing  ?better accuracy than the Friedewald equation in the  ?estimation of LDL-C.  ?Cresenciano Genre et al. Annamaria Helling. 3557;322(02): 2061-2068  ?(http://education.[employer].com/faq/FAQ164) ?  ? ?HDL  ?Date Value Ref Range Status  ?12/25/2021 50 > OR = 40 mg/dL Final  ?02/21/2016 48 >39 mg/dL Final  ? ?Triglycerides  ?Date Value Ref Range Status  ?12/25/2021 69 <150 mg/dL Final  ? ?  ?  ?  Passed - Patient is not pregnant  ?  ?  Passed - Valid encounter within last 12 months  ?  Recent Outpatient Visits   ?      ? 1 week ago Neck mass  ? [facility] Delsa Grana, PA-C  ? 1 month ago Routine general medical examination at a health care facility  ? [place], DO  ? 11 months ago Type 2 diabetes mellitus with other diabetic kidney complication ([place])  ? [facility] Welch, Kristeen Miss, [place]  ? 11 months ago Epigastric pain  ? [facility] Delsa Grana, PA-C  ? 1 year ago Adult general medical exam  ? [facility] Delsa Grana, PA-C  ?  ?  ?Future Appointments   ?        ? In 1 month Teodora Medici, [facility], [place]  ? In 4 months McGowan, Gordan Payment [place]  ?  ? ?  ?  ?  ?? metFORMIN (GLUCOPHAGE-XR) 500 MG 24 hr tablet [Pharmacy Med Name: METFORMIN HCL ER 500 MG TABLET] 180 tablet 0  ?  Sig: TAKE 2 TABLETS BY MOUTH EVERY DAY WITH BREAKFAST  ?  ? Endocrinology:  Diabetes - Biguanides Failed - 02/06/2022  2:50 AM  ?  ?  Failed - B12 Level in normal range and within 720 days  ?  Vitamin B-12  ?Date Value Ref Range Status  ?01/12/2020 527 180 - 914 pg/mL Final  ?  Comment:  ?  (NOTE) ?This assay is not validated for testing neonatal or ?myeloproliferative syndrome specimens for Vitamin B12 levels. ?Performed at [facility], [place] [address], [place], [place] ?[address] ?  ?   ?  ?  Passed - Cr in normal range and within 360 days  ?  Creat  ?Date Value Ref Range Status  ?12/25/2021 1.07 0.70 - 1.30 mg/dL Final  ?   ?  ?  Passed - HBA1C is between 0 and 7.9  and within 180 days  ?  Hgb A1c MFr Bld  ?Date Value Ref Range Status  ?12/25/2021 6.1 (H) <5.7 % of total Hgb Final  ?  Comment:  ?  For someone without known diabetes, a hemoglobin  ?A1c value between 5.7% and 6.4% is consistent with ?prediabetes and should be confirmed with a  ?follow-up test. ?. ?For someone with known diabetes, a value <7% ?indicates that their diabetes is well controlled. A1c ?targets should be individualized based on duration of ?diabetes, age, comorbid conditions, and other ?considerations. ?. ?This assay result is consistent with an increased risk ?of diabetes. ?. ?Currently, no consensus exists regarding use of ?hemoglobin A1c for diagnosis of diabetes for children. ?. ?  ?   ?  ?  Passed - eGFR in normal range and within 360 days  ?  GFR, Est African American  ?Date Value Ref Range Status  ?09/06/2020 80 > OR = 60 mL/min/1.83m Final  ? ?GFR, Est Non African American  ?Date Value Ref Range  Status  ?09/06/2020 69 > OR = 60 mL/min/1.771mFinal  ? ?GFR, Estimated  ?Date Value Ref Range Status  ?01/20/2021 >60 >60 mL/min Final  ?  Comment:  ?  (NOTE) ?Calculated using the CKD-EPI Creatinine Equation (2021) ?  ? ?eGFR  ?Date Value Ref Range Status  ?12/25/2021 80 > OR = 60 mL/min/1.7368minal  ?  Comment:  ?  The eGFR is based on the CKD-EPI 2021 equation. To calculate  ?the new eGFR from a previous Creatinine or Cystatin C ?result, go to https://www.kidney.org/professionals/ ?kdoqi/gfr%5Fcalculator ?  ?   ?  ?  Passed - Valid encounter within last 6 months  ?  Recent Outpatient Visits   ?      ? [facility]  ? 1 month ago Routine general medical examination at a health care facility  [facility]  ? 11 months ago Type 2 diabetes mellitus with other diabetic kidney complication ([place] 11 months ago [facility]igastric [facility]  [facility]  ? 1 year ago [facility] general medical exam  ? [facility]  ?  ?  ?Future Appointments   ?        ? [facility] 4 months [place], [place]  ? ?  ?  ?  Passed - CBC within normal limits and completed in the last 12 months  ?  WBC  ?Date Value Ref Range Status  ?12/25/2021 5.2 3.8 - 10.8 Thousand/uL Final  ? ?RBC  ?Date Value Ref Range Status  ?12/25/2021 7.22 (H) 4.20 - 5.80 Million/uL Final  ? ?Hemoglobin  ?Date Value Ref Range Status  ?12/25/2021 14.4 13.2 - 17.1 g/dL Final  ? ?HGB  ?Date Value Ref Range Status  ?04/04/2014 13.9 13.0 - 18.0 g/dL Final  ? ?HCT  ?Date Value Ref Range Status  ?12/25/2021 46.8 38.5 - 50.0 % Final  ? ?Hematocrit  ?Date Value Ref Range Status  ?05/14/2015 43.5 37.5 - 51.0 % Final  ? ?MCHC  ?Date Value Ref Range  Status  ?12/25/2021 30.8 (L) 32.0 - 36.0 g/dL Final  ? ?MCH  ?Date Value Ref Range Status  ?12/25/2021 19.9 (L) 27.0 -  33.0 pg Final  ? ?MCV  ?Date Value Ref Range Status  ?12/25/2021 64.8 (L) 80.0 - 100.0 fL Final  ?04/04/2014 65 (L) 80 - 100 fL Final  ? ?No results found for: PLTCOUNTKUC, LABPLAT, [place] ?RDW  ?Date Value Ref Range Status  ?12/25/2021 18.6 (H) 11.0 - 15.0 % Final  ?04/04/2014 15.9 (H) 11.5 - 14.5 % Final  ? ?  ?  ?  ?? losartan-hydrochlorothiazide (HYZAAR) 100-25 MG tablet [Pharmacy Med Name: LOSARTAN-HCTZ 100-25 MG TAB] 90 tablet 0  ?  Sig: TAKE 1 TABLET BY MOUTH EVERY DAY  ?  ? Cardiovascular: ARB + Diuretic Combos Passed - 02/06/2022  2:50 AM  ?  ?  Passed - K in normal range and within 180 days  ?  Potassium  ?Date Value Ref Range Status  ?12/25/2021 4.4 3.5 - 5.3 mmol/L Final  ?04/04/2014 3.7 3.5 - 5.1 mmol/L Final  ?   ?  ?  Passed - Na in normal range and within 180 days  ?  Sodium  ?Date Value Ref Range Status  ?12/25/2021 146 135 - 146 mmol/L Final  ?02/21/2016 141 134 - 144 mmol/L Final  ?04/04/2014 144 136 - 145 mmol/L Final  ?   ?  ?  Passed - Cr in normal range and within 180 days  ?  Creat  ?Date Value Ref Range Status  ?12/25/2021 1.07 0.70 - 1.30 mg/dL Final  ?   ?  ?  Passed - eGFR is 10 or above and within 180 days  ?  GFR, Est African American  ?Date Value Ref Range Status  ?09/06/2020 80 > OR = 60 mL/min/1.22m Final  ? ?GFR, Est Non African American  ?Date Value Ref Range Status  ?09/06/2020 69 > OR = 60 mL/min/1.787mFinal  ? ?GFR, Estimated  ?Date Value Ref Range Status  ?01/20/2021 >60 >60 mL/min Final  ?  Comment:  ?  (NOTE) ?Calculated using the CKD-EPI Creatinine Equation (2021) ?  ? ?eGFR  ?Date Value Ref Range Status  ?12/25/2021 80 > OR = 60 mL/min/1.7326minal  ?  Comment:  ?  The eGFR is based on the CKD-EPI 2021 equation. To calculate  ?the new eGFR from a previous Creatinine or Cystatin C ?result, go to  https://www.kidney.org/professionals/ ?kdoqi/gfr%5Fcalculator ?  ?   ?  ?  Passed - Patient is not pregnant  ?  ?  Passed - Last BP in normal range  ?  BP Readings from Last 1 Encounters:  ?01/29/22 126/68  ?   ?  ?  Passed - Valid encounter within last 6 months  ?  Rece

## 2022-02-19 ENCOUNTER — Other Ambulatory Visit: Payer: Self-pay | Admitting: Family Medicine

## 2022-02-19 MED ORDER — CARVEDILOL 12.5 MG PO TABS: 12.5000 mg | ORAL_TABLET | Freq: Two times a day (BID) | ORAL | 3 refills | Status: DC

## 2022-02-19 NOTE — Telephone Encounter (Signed)
Medication Refill - Medication:  ?carvedilol (COREG) 12.5 MG tablet 180 tablet 3 07/02/2020    ?Sig - Route: Take 1 tablet (12.5 mg total) by mouth 2 (two) times daily with a meal. - Oral   ?Sent to pharmacy as: carvedilol (COREG) 12.5 MG tablet   ?E-Prescribing Status: Receipt confirmed by pharmacy    ? ?Pls stray on 3 month scripts ?Has the patient contacted their pharmacy? Yes.   ?(Agent: If no, request that the patient contact the pharmacy for the refill. If patient does not wish to contact the pharmacy document the reason why and proceed with request.) ?(Agent: If yes, when and what did the pharmacy advise?) [place] pharmacy info ? ?Preferred Pharmacy (with phone number or street name):  ?[id number] [address] [place] ?Phone: [phone]: [phone] ?Hours: Not open 24 hours  ? ?Has the patient been seen for an appointment in the last year OR does the patient have an upcoming appointment? Yes.   ? ?Agent: Please be advised that RX refills may take up to 3 business days. We ask that you follow-up with your pharmacy. ? ?

## 2022-02-19 NOTE — Telephone Encounter (Signed)
Requested medication (s) are due for refill today: Yes ? ?Requested medication (s) are on the active medication list: Yes ? ?Last refill:  07/02/20 ? ?Future visit scheduled: Yes ? ?Notes to clinic:  Prescription expired. ? ? ? ?Requested Prescriptions  ?Pending Prescriptions Disp Refills  ? carvedilol (COREG) 12.5 MG tablet 180 tablet 3  ?  Sig: Take 1 tablet (12.5 mg total) by mouth 2 (two) times daily with a meal.  ?  ? Cardiovascular: Beta Blockers 3 Passed - 02/19/2022 11:25 AM  ?  ?  Passed - Cr in normal range and within 360 days  ?  Creat  ?Date Value Ref Range Status  ?12/25/2021 1.07 0.70 - 1.30 mg/dL Final  ?  ?  ?  ?  Passed - AST in normal range and within 360 days  ?  AST  ?Date Value Ref Range Status  ?12/25/2021 19 10 - 35 U/L Final  ?  ?  ?  ?  Passed - ALT in normal range and within 360 days  ?  ALT  ?Date Value Ref Range Status  ?12/25/2021 22 9 - 46 U/L Final  ?  ?  ?  ?  Passed - Last BP in normal range  ?  BP Readings from Last 1 Encounters:  ?01/29/22 126/68  ?  ?  ?  ?  Passed - Last Heart Rate in normal range  ?  Pulse Readings from Last 1 Encounters:  ?01/29/22 81  ?  ?  ?  ?  Passed - Valid encounter within last 6 months  ?  Recent Outpatient Visits   ? ?      ? 3 weeks ago Neck mass  ? [facility] Delsa Grana, PA-C  ? 1 month ago Routine general medical examination at a health care facility  ? [place], DO  ? 11 months ago Type 2 diabetes mellitus with other diabetic kidney complication ([place])  ? [facility] Delsa Grana, PA-C  ? 1 year ago Epigastric pain  ? [facility] Delsa Grana, PA-C  ? 1 year ago Adult general medical exam  ? [facility] Delsa Grana, PA-C  ? ?  ?  ?Future Appointments   ? ?        ? In 1 month Teodora Medici, [facility], [place]  ? In 3 months McGowan, Gordan Payment [place]  ? ?  ? ? ?  ?  ?   ? ?

## 2022-03-31 ENCOUNTER — Encounter: Payer: Self-pay | Admitting: Family Medicine

## 2022-04-02 ENCOUNTER — Ambulatory Visit: Payer: BC Managed Care – PPO | Admitting: Internal Medicine

## 2022-04-03 ENCOUNTER — Encounter: Payer: Self-pay | Admitting: Internal Medicine

## 2022-04-03 ENCOUNTER — Ambulatory Visit: Payer: BC Managed Care – PPO | Admitting: Internal Medicine

## 2022-04-03 VITALS — BP 110/60 | HR 82 | Temp 97.8°F | Resp 18 | Ht 70.0 in | Wt 281.2 lb

## 2022-04-03 DIAGNOSIS — G4733 Obstructive sleep apnea (adult) (pediatric): Secondary | ICD-10-CM

## 2022-04-03 DIAGNOSIS — E559 Vitamin D deficiency, unspecified: Secondary | ICD-10-CM

## 2022-04-03 DIAGNOSIS — E782 Mixed hyperlipidemia: Secondary | ICD-10-CM

## 2022-04-03 DIAGNOSIS — Z5181 Encounter for therapeutic drug level monitoring: Secondary | ICD-10-CM | POA: Diagnosis not present

## 2022-04-03 DIAGNOSIS — I10 Essential (primary) hypertension: Secondary | ICD-10-CM | POA: Diagnosis not present

## 2022-04-03 DIAGNOSIS — R7303 Prediabetes: Secondary | ICD-10-CM

## 2022-04-03 MED ORDER — METFORMIN HCL ER 500 MG PO TB24: 500.0000 mg | ORAL_TABLET | Freq: Every day | ORAL | 1 refills | Status: DC

## 2022-04-03 MED ORDER — LOSARTAN POTASSIUM-HCTZ 100-25 MG PO TABS: 1.0000 | ORAL_TABLET | Freq: Every day | ORAL | 1 refills | Status: DC

## 2022-04-03 MED ORDER — AMLODIPINE BESYLATE 2.5 MG PO TABS: 2.5000 mg | ORAL_TABLET | Freq: Every day | ORAL | 1 refills | Status: DC

## 2022-04-03 NOTE — Progress Notes (Signed)
Established Patient Office Visit  Subjective   [name], male    DOB: Jan 30, 1961  Age: 61 y.o. MRN: [id number]  Chief Complaint  Patient presents with   Follow-up   Hypertension   Diabetes    HPI Marc Stevens is a 61 year old male here to follow-up on chronic medical conditions.  Hypertension/OSA: -Medications: Amlodipine 2.5 mg, Coreg 12.5 mg BID, losartan-HCTZ 100-25 mg -Patient is compliant with above medications and reports no side effects. -Denies any SOB, CP, vision changes, LE edema or symptoms of hypotension -Compliant with CPAP most of the time but needs a new hose and mask  HLD: -Medications: Lipitor 10 mg -Patient is compliant with above medications and reports no side effects.  -Last lipid panel: Lipid Panel     Component Value Date/Time   CHOL 131 12/25/2021 0938   CHOL 137 02/21/2016 1110   TRIG 69 12/25/2021 0938   HDL 50 12/25/2021 0938   HDL 48 02/21/2016 1110   CHOLHDL 2.6 12/25/2021 0938   VLDL 14 03/12/2017 1043   LDLCALC 66 12/25/2021 0938   LABVLDL 18 02/21/2016 1110   Pre-Diabetes: -Last A1c 3/23 6.1% -Medications: Metformin XR 500 mg BID -Patient is compliant with the above medications and reports no side effects.  -Diet: likes to eat watermelon  -Foot exam: Due -Microalbumin: Due -Statin: Yes -PNA vaccine: Due -Currently on Gabapentin 600 mg TID for neuropathy   Vitamin D deficiency: -Currently on vitamin D supplements 5000 IU  Health maintenance: -Blood work up-to-date -Colon cancer screening: Colonoscopy 02/18/2018 repeat in 3 to 5 years    Review of Systems  Constitutional:  Negative for chills and fever.  Eyes:  Negative for blurred vision.  Respiratory:  Negative for shortness of breath.   Cardiovascular:  Negative for chest pain.  Gastrointestinal:  Negative for abdominal pain, diarrhea, nausea and vomiting.  Neurological:  Negative for headaches.      Objective:     BP 110/60   Pulse 82    Temp 97.8 F (36.6 C)   Resp 18   Ht _0  (1.778 m)   Wt 281 lb 3.2 oz (127.6 kg)   SpO2 92%   BMI 40.35 kg/m  BP Readings from Last 3 Encounters:  04/03/22 110/60  01/29/22 126/68  12/25/21 126/82   Wt Readings from Last 3 Encounters:  04/03/22 281 lb 3.2 oz (127.6 kg)  01/29/22 282 lb 1.6 oz (128 kg)  12/25/21 284 lb 14.4 oz (129.2 kg)      Physical Exam Constitutional:      Appearance: Normal appearance.  HENT:     Head: Normocephalic and atraumatic.  Eyes:     Conjunctiva/sclera: Conjunctivae normal.  Cardiovascular:     Rate and Rhythm: Normal rate and regular rhythm.     Pulses:          Dorsalis pedis pulses are 2+ on the right side and 2+ on the left side.  Pulmonary:     Effort: Pulmonary effort is normal.     Breath sounds: Normal breath sounds.  Musculoskeletal:     Right lower leg: No edema.     Left lower leg: No edema.     Right foot: Normal range of motion. No deformity, bunion, Charcot foot, foot drop or prominent metatarsal heads.     Left foot: Normal range of motion. No deformity, bunion, Charcot foot, foot drop or prominent metatarsal heads.  Feet:     Right foot:     Protective  Sensation: 6 sites tested.  6 sites sensed.     Skin integrity: Skin integrity normal.     Toenail Condition: Fungal disease present.    Left foot:     Protective Sensation: 6 sites tested.  6 sites sensed.     Skin integrity: Skin integrity normal.     Toenail Condition: Left toenails are normal.  Skin:    General: Skin is warm and dry.  Neurological:     General: No focal deficit present.     Mental Status: He is alert. Mental status is at baseline.  Psychiatric:        Mood and Affect: Mood normal.        Behavior: Behavior normal.      No results found for any visits on 04/03/22.  Last CBC Lab Results  Component Value Date   WBC 5.2 12/25/2021   HGB 14.4 12/25/2021   HCT 46.8 12/25/2021   MCV 64.8 (L) 12/25/2021   MCH 19.9 (L) 12/25/2021   RDW  18.6 (H) 12/25/2021   PLT 281 15/17/6160   Last metabolic panel Lab Results  Component Value Date   GLUCOSE 102 (H) 12/25/2021   NA 146 12/25/2021   K 4.4 12/25/2021   CL 108 12/25/2021   CO2 28 12/25/2021   BUN 17 12/25/2021   CREATININE 1.07 12/25/2021   EGFR 80 12/25/2021   CALCIUM 9.5 12/25/2021   PROT 6.4 12/25/2021   ALBUMIN 3.9 01/20/2021   LABGLOB 1.9 02/21/2016   AGRATIO 2.2 02/21/2016   BILITOT 0.4 12/25/2021   ALKPHOS 77 01/20/2021   AST 19 12/25/2021   ALT 22 12/25/2021   ANIONGAP 10 01/20/2021   Last lipids Lab Results  Component Value Date   CHOL 131 12/25/2021   HDL 50 12/25/2021   LDLCALC 66 12/25/2021   TRIG 69 12/25/2021   CHOLHDL 2.6 12/25/2021   Last hemoglobin A1c Lab Results  Component Value Date   HGBA1C 6.1 (H) 12/25/2021   Last thyroid functions Lab Results  Component Value Date   TSH 0.55 12/29/2019   Last vitamin D Lab Results  Component Value Date   VD25OH 22 (L) 12/29/2019   Last vitamin B12 and Folate Lab Results  Component Value Date   VPXTGGYI94 854 01/12/2020   FOLATE 25.0 01/12/2020      The 10-year ASCVD risk score (Arnett DK, et al., 2019) is: 16.5%    Assessment & Plan:   1. Pre-diabetes/Medication monitoring encounter: POC A1c improved at 5.8% this morning. Continue Metformin 500 mg XR once or twice a day to help prevent progression. Refills sent. Has neuropathy as well, foot exam today.   - POCT HgB A1C - HM Diabetes Foot Exam - metFORMIN (GLUCOPHAGE-XR) 500 MG 24 hr tablet; Take 1 tablet (500 mg total) by mouth daily with breakfast.  Dispense: 180 tablet; Refill: 1  2. Primary hypertension: Chronic and stable, BP below goal. Continue current regimen of Amlodipine 2.5 mg, Losartan-HCTZ 100-25 mg and Coreg 12.5 BID. Refills that will due were sent to pharmacy.   - amLODipine (NORVASC) 2.5 MG tablet; Take 1 tablet (2.5 mg total) by mouth daily.  Dispense: 90 tablet; Refill: 1 - losartan-hydrochlorothiazide  (HYZAAR) 100-25 MG tablet; Take 1 tablet by mouth daily.  Dispense: 90 tablet; Refill: 1  3. OSA (obstructive sleep apnea): DME order for new hose, mask and filters sent. Otherwise doing well with CPAP.   - For home use only DME Other see comment  4. Mixed hyperlipidemia: Stable, reviewed  last lipid panel from March, continue Lipitor 10 mg.   5. Vitamin D deficiency: Doing well, continue Vitamin D supplements 5000 IU daily.  Return in about 6 months (around 10/03/2022).    Teodora Medici, DO

## 2022-04-03 NOTE — Patient Instructions (Addendum)
It was great seeing you today!  Plan discussed at today's visit: -Medications refilled today -Order for CPAP supplies sent -A1c today 5.8%  Follow up in: 6 months   Take care and let us know if you have any questions or concerns prior to your next visit.  Dr. Rosana Berger

## 2022-05-06 ENCOUNTER — Other Ambulatory Visit: Payer: Self-pay | Admitting: Internal Medicine

## 2022-05-06 DIAGNOSIS — R7303 Prediabetes: Secondary | ICD-10-CM

## 2022-05-07 NOTE — Telephone Encounter (Signed)
Requested Prescriptions  Pending Prescriptions Disp Refills  . atorvastatin (LIPITOR) 10 MG tablet [Pharmacy Med Name: ATORVASTATIN 10 MG TABLET] 90 tablet 1    Sig: TAKE 1 TABLET BY MOUTH EVERYDAY AT BEDTIME     Cardiovascular:  Antilipid - Statins Failed - 05/06/2022  2:27 AM      Failed - Lipid Panel in normal range within the last 12 months    Cholesterol, Total  Date Value Ref Range Status  02/21/2016 137 100 - 199 mg/dL Final   Cholesterol  Date Value Ref Range Status  12/25/2021 131 <200 mg/dL Final   LDL Cholesterol (Calc)  Date Value Ref Range Status  12/25/2021 66 mg/dL (calc) Final    Comment:    Reference range: <100 . Desirable range <100 mg/dL for primary prevention;   <70 mg/dL for patients with CHD or diabetic patients  with > or = 2 CHD risk factors. Marland Kitchen LDL-C is now calculated using the Martin-Hopkins  calculation, which is a validated novel method providing  better accuracy than the Friedewald equation in the  estimation of LDL-C.  Cresenciano Genre et al. Annamaria Helling. 4580;998(33): 2061-2068  (http://education.[employer].com/faq/FAQ164)    HDL  Date Value Ref Range Status  12/25/2021 50 > OR = 40 mg/dL Final  02/21/2016 48 >39 mg/dL Final   Triglycerides  Date Value Ref Range Status  12/25/2021 69 <150 mg/dL Final         Passed - Patient is not pregnant      Passed - Valid encounter within last 12 months    Recent Outpatient Visits          1 month ago Pre-diabetes   [facility] Teodora Medici, DO   3 months ago Neck mass   [facility] Delsa Grana, PA-C   4 months ago Routine general medical examination at a health care facility   [facility] Teodora Medici, DO   1 year ago Type 2 diabetes mellitus with other diabetic kidney complication [facility])   [facility] Delsa Grana, PA-C   1 year ago Epigastric pain   [facility] Delsa Grana, PA-C       Future Appointments            In 1 month McGowan, Hunt Oris, PA-C [place]   In 4 months Teodora Medici, [facility]           . metFORMIN (GLUCOPHAGE-XR) 500 MG 24 hr tablet [Pharmacy Med Name: METFORMIN HCL ER 500 MG TABLET] 180 tablet 1    Sig: TAKE 2 TABLETS BY [place]     Endocrinology:  Diabetes - Biguanides Failed - 05/06/2022  2:27 AM      Failed - B12 Level in normal range and within 720 days    Vitamin B-12  Date Value Ref Range Status  01/12/2020 527 180 - 914 pg/mL Final    Comment:    (NOTE) This assay is not validated for testing neonatal or myeloproliferative syndrome specimens for Vitamin B12 levels. Performed at [facility], [place] [address] [place], [place] [address]          Passed - Cr in normal range and within 360 days    Creat  Date Value Ref Range Status  12/25/2021 1.07 0.70 - 1.30 mg/dL Final         Passed - HBA1C is between 0 and 7.9 and within 180  days    Hgb A1c MFr Bld  Date Value Ref Range Status  12/25/2021 6.1 (H) <5.7 % of total Hgb Final    Comment:    For someone without known diabetes, a hemoglobin  A1c value between 5.7% and 6.4% is consistent with prediabetes and should be confirmed with a  follow-up test. . For someone with known diabetes, a value <7% indicates that their diabetes is well controlled. A1c targets should be individualized based on duration of diabetes, age, comorbid conditions, and other considerations. . This assay result is consistent with an increased risk of diabetes. . Currently, no consensus exists regarding use of hemoglobin A1c for diagnosis of diabetes for children. .          Passed - eGFR in normal range and within 360 days    GFR, Est African American  Date Value Ref Range Status  09/06/2020 80 > OR = 60 mL/min/1.60m Final   GFR, Est Non African American  Date Value Ref Range Status  09/06/2020 69  > OR = 60 mL/min/1.757mFinal   GFR, Estimated  Date Value Ref Range Status  01/20/2021 >60 >60 mL/min Final    Comment:    (NOTE) Calculated using the CKD-EPI Creatinine Equation (2021)    eGFR  Date Value Ref Range Status  12/25/2021 80 > OR = 60 mL/min/1.7333minal    Comment:    The eGFR is based on the CKD-EPI 2021 equation. To calculate  the new eGFR from a previous Creatinine or Cystatin C result, go to https://www.kidney.org/professionals/ kdoqi/gfr%5Fcalculator          Passed - Valid encounter within last 6 months    Recent Outpatient Visits          [facility]   [facility]   4 months ago Routine general medical examination at a health care facility   [facility]   [facility]   1 year [facility]      Future Appointments            In 1 month McGowan, ShaHunt OrisA-C [place] [facility] normal limits and completed in the last 12 months    WBC  Date Value Ref Range Status  12/25/2021 5.2 3.8 - 10.8 Thousand/uL Final   RBC  Date Value Ref Range Status  12/25/2021 7.22 (H) 4.20 - 5.80 Million/uL Final   Hemoglobin  Date Value Ref Range Status  12/25/2021 14.4 13.2 - 17.1 g/dL Final   HGB  Date Value Ref Range Status  04/04/2014 13.9 13.0 - 18.0 g/dL Final   HCT  Date Value Ref Range Status  12/25/2021 46.8 38.5 - 50.0 % Final   Hematocrit  Date Value Ref Range Status  05/14/2015 43.5 37.5 - 51.0 % Final   MCHC  Date Value Ref Range Status  12/25/2021 30.8 (L) 32.0 -  36.0 g/dL Final   [facility] Value Ref Range Status  12/25/2021 19.9 (L) 27.0 - 33.0 pg Final   MCV  Date Value Ref Range Status  12/25/2021 64.8 (L) 80.0 - 100.0 fL Final  04/04/2014 65 (L) 80 - 100 fL Final   No results found for: "PLTCOUNTKUC", "LABPLAT", "POCPLA" RDW  Date Value Ref Range Status  12/25/2021 18.6 (H) 11.0 - 15.0 % Final  04/04/2014 15.9 (H) 11.5 - 14.5 % Final

## 2022-06-12 ENCOUNTER — Other Ambulatory Visit: Payer: Self-pay

## 2022-06-12 DIAGNOSIS — N401 Enlarged prostate with lower urinary tract symptoms: Secondary | ICD-10-CM

## 2022-06-13 NOTE — Progress Notes (Unsigned)
8:48 AM   [name] Jan 14, 1961 [id number]  Referring provider: Delsa Grana, PA-C [address] [place],  [place] [address]  Urological history: 1.  BPH with LU TS -PSA pending -I PSS 5/2  2. ED -Contributing factors of age, HTN, sleep apnea, BPH, obesity, hyperlipidemia and age -SHIM 57 -sildenafil 100 mg, on-demand dosing  3. Prostate cancer screening -baseline PSA 1.8 (2016) at age 61 -PSA at age 65 - 1.39 -african ancestry  -?prostate cancer in father - died in his late 62's of CHF  Chief Complaint  Patient presents with   Benign Prostatic Hypertrophy   Erectile Dysfunction   HPI: ABDIKADIR FOHL is a 61 y.o. male who presents today for yearly follow up.  He has not issues with urination at this time.  He does endorse urgency with urination, but he does not find it excessive and contributes it to his water intake.  Patient denies any modifying or aggravating factors.  Patient denies any gross hematuria, dysuria or suprapubic/flank pain.  Patient denies any fevers, chills, nausea or vomiting.    IPSS     Row Name 06/15/22 0800         International Prostate Symptom Score   How often have you had the sensation of not emptying your bladder? Not at All     How often have you had to urinate less than every two hours? Less than 1 in 5 times     How often have you found you stopped and started again several times when you urinated? Less than 1 in 5 times     How often have you found it difficult to postpone urination? Less than 1 in 5 times     How often have you had a weak urinary stream? Less than 1 in 5 times     How often have you had to strain to start urination? Not at All     How many times did you typically get up at night to urinate? 1 Time     Total IPSS Score 5       Quality of Life due to urinary symptoms   If you were to spend the rest of your life with your urinary condition just the way it is now how would you feel about  that? Mostly Satisfied               Score:  1-7 Mild 8-19 Moderate 20-35 Severe   Patient still having spontaneous erections.  He denies any pain or curvature with erections.  He is taking the sildenafil 100 mg, on-demand-dosing.  He is having success with erections 50% and he has also noticed a drop in his libido.  He is sleeping w/ CPAP.   [place] Name 06/15/22 0830         SHIM: Over the last 6 months:   How do you rate your confidence that you could get and keep an erection? Low     When you had erections with sexual stimulation, how often were your erections hard enough for penetration (entering your partner)? Sometimes (about half the time)     During sexual intercourse, how often were you able to maintain your erection after you had penetrated (entered) your partner? Sometimes (about half the time)     During sexual intercourse, how difficult was it to maintain your erection to completion of intercourse? Very Difficult     When you attempted sexual intercourse,  how often was it satisfactory for you? A Few Times (much less than half the time)       SHIM Total Score   SHIM 12              Score: 1-7 Severe ED 8-11 Moderate ED 12-16 Mild-Moderate ED 17-21 Mild ED 22-25 No ED   PMH: Past Medical History:  Diagnosis Date   BPH (benign prostatic hypertrophy)    Complete rotator cuff tear 08/08/2015   Diabetes mellitus without complication (HCC)    ED (erectile dysfunction)    HTN (hypertension) 04/18/2012   Hyperlipidemia    Low testosterone    Morbidly obese (HCC)    OSA (obstructive sleep apnea)    cpap - does not know settings    Rotator cuff tear    RT   Torn Achilles tendon 07/11/2015   Torn Achilles tendon 07/11/2015    Surgical History: Past Surgical History:  Procedure Laterality Date   COLONOSCOPY WITH PROPOFOL N/A 02/18/2018   Procedure: COLONOSCOPY WITH PROPOFOL;  Surgeon: Jonathon Bellows, MD;  Location: [facility] ENDOSCOPY;  Service:  Gastroenterology;  Laterality: N/A;   DOPPLER ECHOCARDIOGRAPHY  2013   left kene arthroscopy      LUMBAR LAMINECTOMY/DECOMPRESSION MICRODISCECTOMY N/A 01/30/2015   Procedure: MICRO LUMBAR DECOMPRESSION, BILATERAL FORAMINOTOMIES, MICRODISCECTOMY LUMBAR FOUR TO LUMBAR FIVE;  Surgeon: Susa Day, MD;  Location: [facility] ORS;  Service: Orthopedics;  Laterality: N/A;   right knee surgery     ROTATOR CUFF REPAIR  2007   Left   SHOULDER OPEN ROTATOR CUFF REPAIR Right 08/08/2015   Procedure: RIGHT SHOULDER MINI OPEN ROTATOR CUFF REPAIR SUBACROMIAL DECOMPRESSION ;  Surgeon: Susa Day, MD;  Location: [facility] ORS;  Service: Orthopedics;  Laterality: Right;    Home Medications:  Allergies as of 06/15/2022   No Known Allergies      Medication List        Accurate as of June 15, 2022  8:48 AM. If you have any questions, ask your nurse or doctor.          amLODipine 2.5 MG tablet Commonly known as: NORVASC Take 1 tablet (2.5 mg total) by mouth daily.   aspirin EC 81 MG tablet Take 1 tablet (81 mg total) by mouth every morning. Resume 4 days post-op   atorvastatin 10 MG tablet Commonly known as: LIPITOR TAKE 1 TABLET BY MOUTH EVERYDAY AT BEDTIME   carvedilol 12.5 MG tablet Commonly known as: COREG Take 1 tablet (12.5 mg total) by mouth 2 (two) times daily with a meal.   Cholecalciferol 125 MCG (5000 UT) capsule Take by mouth.   diclofenac Sodium 1 % Gel Commonly known as: VOLTAREN   gabapentin 600 MG tablet Commonly known as: Neurontin Take 1 tablet (600 mg total) by mouth 3 (three) times daily.   ketoconazole 2 % cream Commonly known as: NIZORAL Apply topically 2 (two) times daily.   losartan-hydrochlorothiazide 100-25 MG tablet Commonly known as: HYZAAR Take 1 tablet by mouth daily.   metFORMIN 500 MG 24 hr tablet Commonly known as: GLUCOPHAGE-XR TAKE 2 TABLETS BY MOUTH EVERY DAY WITH BREAKFAST   multivitamin tablet Take 1 tablet by mouth daily.   sildenafil 100 MG  tablet Commonly known as: VIAGRA TAKE 1 TABLET BY MOUTH DAILY AS NEEDED FOR ERECTILE DYSFUNCTION.   traMADol 50 MG tablet Commonly known as: ULTRAM Take 50 mg by mouth every 6 (six) hours.   VITAMIN D (ERGOCALCIFEROL) PO Take 5,000 Units by mouth daily.  Allergies: No Known Allergies  Family History: Family History  Problem Relation Age of Onset   Dementia Mother    Hypertension Mother    Cancer Father        colon   Heart disease Father    Congestive Heart Failure Father    Hypertension Sister    Dementia Maternal Grandmother    Kidney disease Neg Hx    Prostate cancer Neg Hx    COPD Neg Hx    Diabetes Neg Hx    Stroke Neg Hx    Kidney cancer Neg Hx    Bladder Cancer Neg Hx     Social History:  reports that he quit smoking about 17 years ago. His smoking use included cigarettes. He has a 15.00 pack-year smoking history. He has never used smokeless tobacco. He reports current alcohol use of about 4.0 standard drinks of alcohol per week. He reports that he does not use drugs.  ROS: For pertinent review of systems please refer to history of present illness  Physical Exam: Blood pressure (!) 147/83, pulse 65, height '5\' 10"'$  (1.778 m), weight 275 lb (124.7 kg). Constitutional:  Well nourished. Alert and oriented, No acute distress. HEENT: [place] AT, moist mucus membranes.  Trachea midline Cardiovascular: No clubbing, cyanosis, or edema. Respiratory: Normal respiratory effort, no increased work of breathing. GU: No CVA tenderness.  No bladder fullness or masses.  Patient with uncircumcised phallus. Foreskin easily retracted  Urethral meatus is patent.  No penile discharge. No penile lesions or rashes. Scrotum without lesions, cysts, rashes and/or edema.  Testicles are located scrotally bilaterally. No masses are appreciated in the testicles. Left and right epididymis are normal. Rectal: Patient with  normal sphincter tone. Anus and perineum without scarring or rashes. No  rectal masses are appreciated. Prostate is approximately 60 + grams, could only palpate the apex, no nodules are appreciated. Seminal vesicles could not be palpated.  Neurologic: Grossly intact, no focal deficits, moving all 4 extremities. Psychiatric: Normal mood and affect.   Laboratory Data:    Latest Ref Rng & Units 12/25/2021    9:38 AM 01/20/2021   10:40 AM 09/06/2020    8:47 AM  CMP  Glucose 65 - 99 mg/dL 102  163  111   BUN 7 - 25 mg/dL '17  26  20   '$ Creatinine 0.70 - 1.30 mg/dL 1.07  1.09  1.16   Sodium 135 - 146 mmol/L 146  141  143   Potassium 3.5 - 5.3 mmol/L 4.4  3.7  4.0   Chloride 98 - 110 mmol/L 108  105  104   CO2 20 - 32 mmol/L '28  26  30   '$ Calcium 8.6 - 10.3 mg/dL 9.5  9.2  9.7   Total Protein 6.1 - 8.1 g/dL 6.4  6.8  6.6   Total Bilirubin 0.2 - 1.2 mg/dL 0.4  1.0  0.4   Alkaline Phos 38 - 126 U/L  77    AST 10 - 35 U/L '19  30  26   '$ ALT 9 - 46 U/L '22  26  26        '$ Latest Ref Rng & Units 12/25/2021    9:38 AM 01/20/2021   10:40 AM 09/06/2020    8:47 AM  CBC  WBC 3.8 - 10.8 Thousand/uL 5.2  5.2  4.6   Hemoglobin 13.2 - 17.1 g/dL 14.4  15.9  15.1   Hematocrit 38.5 - 50.0 % 46.8  50.4  47.6   Platelets  140 - 400 Thousand/uL 281  267  287     Component     Latest Ref Rng 12/25/2021  Hemoglobin A1C     <5.7 % of total Hgb 6.1 (H)   Mean Plasma Glucose     mg/dL 128   eAG (mmol/L)     mmol/L 7.1     Legend: (H) High  Component     Latest Ref Rng 12/25/2021  Cholesterol     <200 mg/dL 131   HDL Cholesterol     > OR = 40 mg/dL 50   Triglycerides     <150 mg/dL 69   LDL Cholesterol (Calc)     mg/dL (calc) 66   Total CHOL/HDL Ratio     <5.0 (calc) 2.6   Non-HDL Cholesterol (Calc)     <130 mg/dL (calc) 81   I have reviewed the labs  Pertinent Imaging N/A  Assessment & Plan:    1. Erectile dysfunction -not at goal with erections -encouraged good diabetic control -encouraged to continue CPAP use -check testosterone level today- per AUA  guidelines -sildenafil 100 mg, on-demand-dosing-refills  2 . BPH with LUTS -PSA pending -DRE benign -symptoms - urgency -continue conservative management, avoiding bladder irritants and timed voiding's  3. Prostate cancer screening -discussed AUA guidelines for early detection of prostate cancer -through shared decision making decided to continue yearly screenings at this time  Return in about 1 year (around 06/16/2023) for IPSS, SHIM, PSA and exam.  Zara Council, PA-C  [facility] [address] [place], [place] [address] [phone]

## 2022-06-15 ENCOUNTER — Other Ambulatory Visit
Admission: RE | Admit: 2022-06-15 | Discharge: 2022-06-15 | Disposition: A | Discharge status: Home / Self Care | Payer: BC Managed Care – PPO | Attending: Urology | Admitting: Urology

## 2022-06-15 ENCOUNTER — Ambulatory Visit (INDEPENDENT_AMBULATORY_CARE_PROVIDER_SITE_OTHER): Payer: BC Managed Care – PPO | Admitting: Urology

## 2022-06-15 ENCOUNTER — Encounter: Payer: Self-pay | Admitting: Urology

## 2022-06-15 VITALS — BP 147/83 | HR 65 | Ht 70.0 in | Wt 275.0 lb

## 2022-06-15 DIAGNOSIS — Z125 Encounter for screening for malignant neoplasm of prostate: Secondary | ICD-10-CM | POA: Diagnosis not present

## 2022-06-15 DIAGNOSIS — N138 Other obstructive and reflux uropathy: Secondary | ICD-10-CM | POA: Diagnosis present

## 2022-06-15 DIAGNOSIS — N529 Male erectile dysfunction, unspecified: Secondary | ICD-10-CM | POA: Insufficient documentation

## 2022-06-15 DIAGNOSIS — N401 Enlarged prostate with lower urinary tract symptoms: Secondary | ICD-10-CM | POA: Diagnosis present

## 2022-06-15 DIAGNOSIS — Z8639 Personal history of other endocrine, nutritional and metabolic disease: Secondary | ICD-10-CM | POA: Diagnosis present

## 2022-06-15 LAB — PSA: Prostatic Specific Antigen: 0.88 ng/mL (ref 0.00–4.00)

## 2022-06-15 MED ORDER — SILDENAFIL CITRATE 100 MG PO TABS: ORAL_TABLET | ORAL | 3 refills | Status: DC

## 2022-06-16 ENCOUNTER — Telehealth: Payer: Self-pay

## 2022-06-16 LAB — TESTOSTERONE: Testosterone: 313 ng/dL (ref 264–916)

## 2022-06-16 NOTE — Telephone Encounter (Signed)
-----   Message from Nori Riis, PA-C sent at 06/16/2022  2:17 PM EDT ----- Will you let Mr. Havey know that his testosterone was just above low normal?   I would like to repeat it in 2 days or next week with a free and total testosterone

## 2022-06-16 NOTE — Telephone Encounter (Signed)
OK per DPR, [facility] notifying pt of results. Asked him to return call for lab appt.

## 2022-06-17 ENCOUNTER — Other Ambulatory Visit: Payer: Self-pay

## 2022-06-17 DIAGNOSIS — R7989 Other specified abnormal findings of blood chemistry: Secondary | ICD-10-CM

## 2022-06-17 NOTE — Telephone Encounter (Signed)
Pt returned call and will go to [place] to have labs drawn.  Can you put order in please?

## 2022-06-19 NOTE — Progress Notes (Signed)
MRN : [id number]  Marc Stevens is a 61 y.o. (02/07/1961) male who presents with chief complaint of legs swell.  History of Present Illness:    The patient returns to the office for followup evaluation regarding leg swelling.  The swelling has persisted but with the lymph pump the patient states the swelling is much better controlled. The pain associated with swelling is essentially eliminated. There have not been any interval development of a ulcerations or wounds.  No episodes of cellulitis or infection over the past 12 months  The patient denies problems with the pump, noting it is working well and the leggings are in good condition.  Since the previous visit the patient has been wearing graduated compression stockings and using the lymph pump on a routine basis and  has noted significant improvement in the lymphedema.   Patient stated the lymph pump has been helpful in treating the lymphedema.     No outpatient medications have been marked as taking for the 06/25/22 encounter (Appointment) with Delana Meyer, Dolores Lory, MD.    Past Medical History:  Diagnosis Date   BPH (benign prostatic hypertrophy)    Complete rotator cuff tear 08/08/2015   Diabetes mellitus without complication [facility])    ED (erectile dysfunction)    HTN (hypertension) 04/18/2012   Hyperlipidemia    Low testosterone    Morbidly obese (HCC)    OSA (obstructive sleep apnea)    cpap - does not know settings    Rotator cuff tear    RT   Torn Achilles tendon 07/11/2015   Torn Achilles tendon 07/11/2015    Past Surgical History:  Procedure Laterality Date   COLONOSCOPY WITH PROPOFOL N/A 02/18/2018   Procedure: COLONOSCOPY WITH PROPOFOL;  Surgeon: Jonathon Bellows, MD;  Location: [facility] ENDOSCOPY;  Service: Gastroenterology;  Laterality: N/A;   DOPPLER ECHOCARDIOGRAPHY  2013   left kene arthroscopy      LUMBAR LAMINECTOMY/DECOMPRESSION MICRODISCECTOMY N/A 01/30/2015   Procedure: MICRO LUMBAR DECOMPRESSION,  BILATERAL FORAMINOTOMIES, MICRODISCECTOMY LUMBAR FOUR TO LUMBAR FIVE;  Surgeon: Susa Day, MD;  Location: [facility] ORS;  Service: Orthopedics;  Laterality: N/A;   right knee surgery     ROTATOR CUFF REPAIR  2007   Left   SHOULDER OPEN ROTATOR CUFF REPAIR Right 08/08/2015   Procedure: RIGHT SHOULDER MINI OPEN ROTATOR CUFF REPAIR SUBACROMIAL DECOMPRESSION ;  Surgeon: Susa Day, MD;  Location: [facility] ORS;  Service: Orthopedics;  Laterality: Right;    Social History Social History   Tobacco Use   Smoking status: Former    Packs/day: 0.50    Years: 30.00    Total pack years: 15.00    Types: Cigarettes    Quit date: 10/19/2004    Years since quitting: 17.6   Smokeless tobacco: Never  Vaping Use   Vaping Use: Never used  Substance Use Topics   Alcohol use: Yes    Alcohol/week: 4.0 standard drinks of alcohol    Types: 4 Shots of liquor per week    Comment: occasional   Drug use: No    Family History Family History  Problem Relation Age of Onset   Dementia Mother    Hypertension Mother    Cancer Father        colon   Heart disease Father    Congestive Heart Failure Father    Hypertension Sister    Dementia Maternal Grandmother    Kidney disease Neg Hx    Prostate cancer Neg Hx    COPD Neg  Hx    Diabetes Neg Hx    Stroke Neg Hx    Kidney cancer Neg Hx    Bladder Cancer Neg Hx     No Known Allergies   REVIEW OF SYSTEMS (Negative unless checked)  Constitutional: '[]'$ Weight loss  '[]'$ Fever  '[]'$ Chills Cardiac: '[]'$ Chest pain   '[]'$ Chest pressure   '[]'$ Palpitations   '[]'$ Shortness of breath when laying flat   '[]'$ Shortness of breath with exertion. Vascular:  '[]'$ Pain in legs with walking   '[x]'$ Pain in legs with standing  '[]'$ History of DVT   '[]'$ Phlebitis   '[x]'$ Swelling in legs   '[]'$ Varicose veins   '[]'$ Non-healing ulcers Pulmonary:   '[]'$ Uses home oxygen   '[]'$ Productive cough   '[]'$ Hemoptysis   '[]'$ Wheeze  '[]'$ COPD   '[]'$ Asthma Neurologic:  '[]'$ Dizziness   '[]'$ Seizures   '[]'$ History of stroke   '[]'$ History of TIA   '[]'$ Aphasia   '[]'$ Vissual changes   '[]'$ Weakness or numbness in arm   '[]'$ Weakness or numbness in leg Musculoskeletal:   '[]'$ Joint swelling   '[]'$ Joint pain   '[]'$ Low back pain Hematologic:  '[]'$ Easy bruising  '[]'$ Easy bleeding   '[]'$ Hypercoagulable state   '[]'$ Anemic Gastrointestinal:  '[]'$ Diarrhea   '[]'$ Vomiting  '[]'$ Gastroesophageal reflux/heartburn   '[]'$ Difficulty swallowing. Genitourinary:  '[]'$ Chronic kidney disease   '[]'$ Difficult urination  '[]'$ Frequent urination   '[]'$ Blood in urine Skin:  '[]'$ Rashes   '[]'$ Ulcers  Psychological:  '[]'$ History of anxiety   '[]'$  History of major depression.  Physical Examination  There were no vitals filed for this visit. There is no height or weight on file to calculate BMI. Gen: WD/WN, NAD Head: [place]/AT, No temporalis wasting.  Ear/Nose/Throat: Hearing grossly intact, nares w/o erythema or drainage, pinna without lesions Eyes: PER, EOMI, sclera nonicteric.  Neck: Supple, no gross masses.  No JVD.  Pulmonary:  Good air movement, no audible wheezing, no use of accessory muscles.  Cardiac: RRR, precordium not hyperdynamic. Vascular:  scattered varicosities present bilaterally.  Mild venous stasis changes to the legs bilaterally.  2+ soft pitting edema  Vessel Right Left  Radial Palpable Palpable  Gastrointestinal: soft, non-distended. No guarding/no peritoneal signs.  Musculoskeletal: M/S 5/5 throughout.  No deformity.  Neurologic: CN 2-12 intact. Pain and light touch intact in extremities.  Symmetrical.  Speech is fluent. Motor exam as listed above. Psychiatric: Judgment intact, Mood & affect appropriate for pt's clinical situation. Dermatologic: Venous rashes no ulcers noted.  No changes consistent with cellulitis. Lymph : No lichenification or skin changes of chronic lymphedema.  CBC Lab Results  Component Value Date   WBC 5.2 12/25/2021   HGB 14.4 12/25/2021   HCT 46.8 12/25/2021   MCV 64.8 (L) 12/25/2021   PLT 281 12/25/2021    BMET    Component Value Date/Time   NA 146  12/25/2021 0938   NA 141 02/21/2016 1110   NA 144 04/04/2014 1613   K 4.4 12/25/2021 0938   K 3.7 04/04/2014 1613   CL 108 12/25/2021 0938   CL 109 (H) 04/04/2014 1613   CO2 28 12/25/2021 0938   CO2 28 04/04/2014 1613   GLUCOSE 102 (H) 12/25/2021 0938   GLUCOSE 96 04/04/2014 1613   BUN 17 12/25/2021 0938   BUN 19 02/21/2016 1110   BUN 15 04/04/2014 1613   CREATININE 1.07 12/25/2021 0938   CALCIUM 9.5 12/25/2021 0938   CALCIUM 8.6 04/04/2014 1613   GFRNONAA >60 01/20/2021 1040   GFRNONAA 69 09/06/2020 0847   GFRAA 80 09/06/2020 0847   CrCl cannot be calculated (Patient's most recent lab result  is older than the maximum 21 days allowed.).  COAG Lab Results  Component Value Date   INR 0.90 04/17/2012    Radiology No results found.   Assessment/Plan 1. Lymphedema Recommend:  No surgery or intervention at this point in time.    I have reviewed my discussion with the patient regarding lymphedema and why it  causes symptoms.  Patient will continue wearing graduated compression on a daily basis. The patient should put the compression on first thing in the morning and removing them in the evening. The patient should not sleep in the compression.   In addition, behavioral modification throughout the day will be continued.  This will include frequent elevation (such as in a recliner), use of over the counter pain medications as needed and exercise such as walking.  The systemic causes for chronic edema such as liver, kidney and cardiac etiologies does not appear to have significant changed over the past year.    The patient will continue aggressive use of the  lymph pump.  This will continue to improve the edema control and prevent sequela such as ulcers and infections.   The patient will follow-up with me on a PRN basis.    2. Type 2 diabetes mellitus with other diabetic kidney complication (HCC) Continue hypoglycemic medications as already ordered, these medications have been  reviewed and there are no changes at this time.  Hgb A1C to be monitored as already arranged by primary service   3. Benign essential hypertension Continue antihypertensive medications as already ordered, these medications have been reviewed and there are no changes at this time.   4. Mixed hyperlipidemia Continue statin as ordered and reviewed, no changes at this time     Hortencia Pilar, MD  06/19/2022 7:52 AM

## 2022-06-24 ENCOUNTER — Other Ambulatory Visit
Admission: RE | Admit: 2022-06-24 | Discharge: 2022-06-24 | Disposition: A | Discharge status: Home / Self Care | Payer: BC Managed Care – PPO | Attending: Urology | Admitting: Urology

## 2022-06-24 DIAGNOSIS — R7989 Other specified abnormal findings of blood chemistry: Secondary | ICD-10-CM | POA: Diagnosis not present

## 2022-06-25 ENCOUNTER — Encounter (INDEPENDENT_AMBULATORY_CARE_PROVIDER_SITE_OTHER): Payer: Self-pay | Admitting: Vascular Surgery

## 2022-06-25 ENCOUNTER — Ambulatory Visit (INDEPENDENT_AMBULATORY_CARE_PROVIDER_SITE_OTHER): Payer: BC Managed Care – PPO | Admitting: Vascular Surgery

## 2022-06-25 VITALS — BP 146/80 | HR 67 | Resp 17 | Ht 71.0 in | Wt 277.0 lb

## 2022-06-25 DIAGNOSIS — I10 Essential (primary) hypertension: Secondary | ICD-10-CM

## 2022-06-25 DIAGNOSIS — E782 Mixed hyperlipidemia: Secondary | ICD-10-CM | POA: Diagnosis not present

## 2022-06-25 DIAGNOSIS — I890 Lymphedema, not elsewhere classified: Secondary | ICD-10-CM

## 2022-06-25 DIAGNOSIS — E1129 Type 2 diabetes mellitus with other diabetic kidney complication: Secondary | ICD-10-CM

## 2022-06-26 ENCOUNTER — Encounter (INDEPENDENT_AMBULATORY_CARE_PROVIDER_SITE_OTHER): Payer: Self-pay | Admitting: Vascular Surgery

## 2022-06-29 LAB — TESTOSTERONE,FREE AND TOTAL
Testosterone, Free: 5.3 pg/mL — ABNORMAL LOW (ref 6.6–18.1)
Testosterone: 310 ng/dL (ref 264–916)

## 2022-06-30 ENCOUNTER — Telehealth: Payer: Self-pay

## 2022-06-30 NOTE — Telephone Encounter (Signed)
LMOM for pt to return call, also sent mychart msg, see separate encounter.

## 2022-06-30 NOTE — Telephone Encounter (Signed)
-----   Message from Nori Riis, PA-C sent at 06/30/2022  8:03 AM EDT ----- Please let Mr. Porzio know that his testosterone level was low normal, but his free testosterone was low.  I recommend to start Clomid at this time to see if we can increase his testosterone levels.  At this time, [facility] or [facility] have the lower cost for the Clomid.  Would he like for [place] to send in a prescription for him?

## 2022-07-01 ENCOUNTER — Other Ambulatory Visit: Payer: Self-pay | Admitting: Urology

## 2022-07-01 ENCOUNTER — Other Ambulatory Visit: Payer: Self-pay

## 2022-07-01 DIAGNOSIS — R7989 Other specified abnormal findings of blood chemistry: Secondary | ICD-10-CM

## 2022-07-01 MED ORDER — CLOMIPHENE CITRATE 50 MG PO TABS: ORAL_TABLET | ORAL | 3 refills | Status: DC

## 2022-07-13 ENCOUNTER — Encounter: Payer: Self-pay | Admitting: Family Medicine

## 2022-08-17 ENCOUNTER — Encounter (INDEPENDENT_AMBULATORY_CARE_PROVIDER_SITE_OTHER): Payer: Self-pay

## 2022-09-19 ENCOUNTER — Other Ambulatory Visit: Payer: Self-pay | Admitting: Internal Medicine

## 2022-09-19 DIAGNOSIS — Z5181 Encounter for therapeutic drug level monitoring: Secondary | ICD-10-CM

## 2022-09-19 DIAGNOSIS — I10 Essential (primary) hypertension: Secondary | ICD-10-CM

## 2022-09-21 NOTE — Telephone Encounter (Signed)
Requested Prescriptions  Pending Prescriptions Disp Refills   amLODipine (NORVASC) 2.5 MG tablet [Pharmacy Med Name: AMLODIPINE BESYLATE 2.5 MG TAB] 90 tablet 0    Sig: TAKE 1 TABLET BY MOUTH EVERY DAY     Cardiovascular: Calcium Channel Blockers 2 Failed - 09/19/2022  9:34 AM      Failed - Last BP in normal range    BP Readings from Last 1 Encounters:  06/25/22 (!) 146/80         Passed - Last Heart Rate in normal range    Pulse Readings from Last 1 Encounters:  06/25/22 67         Passed - Valid encounter within last 6 months    Recent Outpatient Visits           5 months ago Pre-diabetes   [place], DO   7 months ago Neck mass   [facility] Delsa Grana, PA-C   9 months ago Routine general medical examination at a health care facility   [place], DO   1 year ago Type 2 diabetes mellitus with other diabetic kidney complication [facility])   [facility] Delsa Grana, PA-C   1 year ago Epigastric pain   [facility] Delsa Grana, PA-C       Future Appointments             In 1 week Teodora Medici, [facility], [place]   In 8 months [place], [facility] [place]

## 2022-10-01 NOTE — Progress Notes (Signed)
Established Patient Office Visit  Subjective   [name], male    DOB: 1960/10/20  Age: 61 y.o. MRN: [id number]  Chief Complaint  Patient presents with   Follow-up   Hypertension   Hyperlipidemia   Diabetes    HPI Marc Stevens is a 61 year old male here to follow-up on chronic medical conditions.  Hypertension/OSA: -Medications: Amlodipine 2.5 mg, Coreg 12.5 mg BID, losartan-HCTZ 100-25 mg -Patient is compliant with above medications and reports no side effects. -Denies any SOB, CP, vision changes, LE edema or symptoms of hypotension -Compliant with CPAP   HLD: -Medications: Lipitor 10 mg -Patient is compliant with above medications and reports no side effects.  -Last lipid panel: Lipid Panel     Component Value Date/Time   CHOL 131 12/25/2021 0938   CHOL 137 02/21/2016 1110   TRIG 69 12/25/2021 0938   HDL 50 12/25/2021 0938   HDL 48 02/21/2016 1110   CHOLHDL 2.6 12/25/2021 0938   VLDL 14 03/12/2017 1043   LDLCALC 66 12/25/2021 0938   LABVLDL 18 02/21/2016 1110   Pre-Diabetes: -Last A1c 3/23 6.1% -Medications: Metformin XR 500 mg BID -Patient is compliant with the above medications and reports no side effects.  -Diet: likes to eat watermelon  -Foot exam: UTD 6/23 -Microalbumin: Due -Statin: Yes -PNA vaccine: Due, politely declines today -Currently on Gabapentin 600 mg TID for neuropathy - no changes.   Vitamin D deficiency: -Currently on vitamin D supplements 5000 IU  Health maintenance: -Blood work up-to-date -Colon cancer screening: Colonoscopy 02/18/2018 repeat due in 5 years   Review of Systems  Constitutional:  Negative for chills and fever.  Eyes:  Negative for blurred vision.  Respiratory:  Negative for shortness of breath.   Cardiovascular:  Negative for chest pain.  Gastrointestinal:  Negative for abdominal pain and diarrhea.  Neurological:  Negative for headaches.      Objective:     BP 138/80   Pulse 85    Temp 98.4 F (36.9 C)   Resp 18   Ht _0  (1.803 m)   Wt 274 lb 3.2 oz (124.4 kg)   SpO2 94%   BMI 38.24 kg/m  BP Readings from Last 3 Encounters:  10/02/22 138/80  06/25/22 (!) 146/80  06/15/22 (!) 147/83   Wt Readings from Last 3 Encounters:  10/02/22 274 lb 3.2 oz (124.4 kg)  06/25/22 277 lb (125.6 kg)  06/15/22 275 lb (124.7 kg)      Physical Exam Constitutional:      Appearance: Normal appearance.  HENT:     Head: Normocephalic and atraumatic.  Eyes:     Conjunctiva/sclera: Conjunctivae normal.  Cardiovascular:     Rate and Rhythm: Normal rate and regular rhythm.  Pulmonary:     Effort: Pulmonary effort is normal.     Breath sounds: Normal breath sounds.  Musculoskeletal:     Right lower leg: No edema.     Left lower leg: No edema.  Skin:    General: Skin is warm and dry.  Neurological:     General: No focal deficit present.     Mental Status: He is alert. Mental status is at baseline.  Psychiatric:        Mood and Affect: Mood normal.        Behavior: Behavior normal.      Results for orders placed or performed in visit on 10/02/22  POCT HgB A1C  Result Value Ref Range   Hemoglobin A1C 5.7 (A)  4.0 - 5.6 %   HbA1c POC (<> result, manual entry)     HbA1c, POC (prediabetic range)     HbA1c, POC (controlled diabetic range)      Last CBC Lab Results  Component Value Date   WBC 5.2 12/25/2021   HGB 14.4 12/25/2021   HCT 46.8 12/25/2021   MCV 64.8 (L) 12/25/2021   MCH 19.9 (L) 12/25/2021   RDW 18.6 (H) 12/25/2021   PLT 281 99/35/7017   Last metabolic panel Lab Results  Component Value Date   GLUCOSE 102 (H) 12/25/2021   NA 146 12/25/2021   K 4.4 12/25/2021   CL 108 12/25/2021   CO2 28 12/25/2021   BUN 17 12/25/2021   CREATININE 1.07 12/25/2021   EGFR 80 12/25/2021   CALCIUM 9.5 12/25/2021   PROT 6.4 12/25/2021   ALBUMIN 3.9 01/20/2021   LABGLOB 1.9 02/21/2016   AGRATIO 2.2 02/21/2016   BILITOT 0.4 12/25/2021   ALKPHOS 77 01/20/2021    AST 19 12/25/2021   ALT 22 12/25/2021   ANIONGAP 10 01/20/2021   Last lipids Lab Results  Component Value Date   CHOL 131 12/25/2021   HDL 50 12/25/2021   LDLCALC 66 12/25/2021   TRIG 69 12/25/2021   CHOLHDL 2.6 12/25/2021   Last hemoglobin A1c Lab Results  Component Value Date   HGBA1C 5.7 (A) 10/02/2022   Last thyroid functions Lab Results  Component Value Date   TSH 0.55 12/29/2019   Last vitamin D Lab Results  Component Value Date   VD25OH 22 (L) 12/29/2019   Last vitamin B12 and Folate Lab Results  Component Value Date   BLTJQZES92 330 01/12/2020   FOLATE 25.0 01/12/2020      The 10-year ASCVD risk score (Arnett DK, et al., 2019) is: 24.3%    Assessment & Plan:   1. Primary hypertension: Chronic and stable.  Blood pressure borderline today.  Current regimen which includes amlodipine 2.5 mg, Coreg 12.5 mg twice daily, losartan-HCTZ 100-25 mg daily, all refilled today.  Follow-up in 6 months.  - amLODipine (NORVASC) 2.5 MG tablet; Take 1 tablet (2.5 mg total) by mouth daily.  Dispense: 90 tablet; Refill: 1 - carvedilol (COREG) 12.5 MG tablet; Take 1 tablet (12.5 mg total) by mouth 2 (two) times daily with a meal.  Dispense: 180 tablet; Refill: 1 - losartan-hydrochlorothiazide (HYZAAR) 100-25 MG tablet; Take 1 tablet by mouth daily.  Dispense: 90 tablet; Refill: 1  2. Pre-diabetes: A1c improved at 5.7%.  Continue metformin extended release 500 mg twice daily, microalbumin today.  - POCT HgB A1C - Urine Microalbumin w/creat. ratio - metFORMIN (GLUCOPHAGE-XR) 500 MG 24 hr tablet; TAKE 2 TABLETS BY MOUTH EVERY DAY WITH BREAKFAST  Dispense: 180 tablet; Refill: 1  3. Neuropathy: Stable, continue gabapentin 600 mg TID, refilled. Discussed nightly foot exams and avoiding being barefoot.  - gabapentin (NEURONTIN) 600 MG tablet; Take 1 tablet (600 mg total) by mouth 3 (three) times daily.  Dispense: 270 tablet; Refill: 3  4. Mixed hyperlipidemia: Chronic and  stable, continue Lipitor 10 mg, refilled today.  - atorvastatin (LIPITOR) 10 MG tablet; Take 1 tablet (10 mg total) by mouth daily.  Dispense: 90 tablet; Refill: 1  5. Colon cancer screening: Due for repeat colonoscopy, referral to GI placed.  - Ambulatory referral to Gastroenterology   Return in about 6 months (around 04/03/2023).    Teodora Medici, DO

## 2022-10-02 ENCOUNTER — Ambulatory Visit (INDEPENDENT_AMBULATORY_CARE_PROVIDER_SITE_OTHER): Payer: BC Managed Care – PPO | Admitting: Internal Medicine

## 2022-10-02 ENCOUNTER — Encounter: Payer: Self-pay | Admitting: Internal Medicine

## 2022-10-02 VITALS — BP 138/80 | HR 85 | Temp 98.4°F | Resp 18 | Ht 71.0 in | Wt 274.2 lb

## 2022-10-02 DIAGNOSIS — I10 Essential (primary) hypertension: Secondary | ICD-10-CM | POA: Diagnosis not present

## 2022-10-02 DIAGNOSIS — Z1211 Encounter for screening for malignant neoplasm of colon: Secondary | ICD-10-CM

## 2022-10-02 DIAGNOSIS — E782 Mixed hyperlipidemia: Secondary | ICD-10-CM | POA: Diagnosis not present

## 2022-10-02 DIAGNOSIS — G629 Polyneuropathy, unspecified: Secondary | ICD-10-CM | POA: Diagnosis not present

## 2022-10-02 DIAGNOSIS — R7303 Prediabetes: Secondary | ICD-10-CM | POA: Diagnosis not present

## 2022-10-02 LAB — POCT GLYCOSYLATED HEMOGLOBIN (HGB A1C): Hemoglobin A1C: 5.7 % — AB (ref 4.0–5.6)

## 2022-10-02 MED ORDER — METFORMIN HCL ER 500 MG PO TB24: ORAL_TABLET | ORAL | 1 refills | Status: DC

## 2022-10-02 MED ORDER — LOSARTAN POTASSIUM-HCTZ 100-25 MG PO TABS: 1.0000 | ORAL_TABLET | Freq: Every day | ORAL | 1 refills | Status: DC

## 2022-10-02 MED ORDER — ATORVASTATIN CALCIUM 10 MG PO TABS: 10.0000 mg | ORAL_TABLET | Freq: Every day | ORAL | 1 refills | Status: DC

## 2022-10-02 MED ORDER — GABAPENTIN 600 MG PO TABS: 600.0000 mg | ORAL_TABLET | Freq: Three times a day (TID) | ORAL | 3 refills | Status: DC

## 2022-10-02 MED ORDER — AMLODIPINE BESYLATE 2.5 MG PO TABS: 2.5000 mg | ORAL_TABLET | Freq: Every day | ORAL | 1 refills | Status: DC

## 2022-10-02 MED ORDER — CARVEDILOL 12.5 MG PO TABS: 12.5000 mg | ORAL_TABLET | Freq: Two times a day (BID) | ORAL | 1 refills | Status: DC

## 2022-10-02 NOTE — Patient Instructions (Signed)
It was great seeing you today!  Plan discussed at today's visit: -Medications refilled -Colonoscopy referral placed today -Plan to be fasting at least 8-12 hours prior to next visit for labs  Follow up in: 6 months   Take care and let us know if you have any questions or concerns prior to your next visit.  Dr. Rosana Berger   Pneumococcal Conjugate Vaccine (PCV20) Injection What is this medication? PNEUMOCOCCAL CONJUGATE VACCINE (NEU mo KOK al kon ju gate vak SEEN) reduces the risk of pneumococcal disease, such as pneumonia. It does not treat pneumococcal disease. It is still possible to get pneumococcal disease after receiving this vaccine, but the symptoms may be less severe or not last as long. It works by helping your immune system learn how to fight off a future infection. This medicine may be used for other purposes; ask your health care provider or pharmacist if you have questions. COMMON BRAND NAME(S): Prevnar 20 What should I tell my care team before I take this medication? They need to know if you have any of these conditions: Bleeding disorder Fever Immune system problems An unusual or allergic reaction to pneumococcal vaccine, diphtheria toxoid, other vaccines, other medications, foods, dyes, or preservatives Pregnant or trying to get pregnant Breastfeeding How should I use this medication? This vaccine is injected into a muscle. It is given by your care team. A copy of Vaccine Information Statements will be given before each vaccination. Be sure to read this information carefully each time. This sheet may change often. Talk to your care team about the use of this medication in children. While it may be given to children as young as 6 weeks for selected conditions, precautions do apply. Overdosage: If you think you have taken too much of this medicine contact a poison control center or emergency room at once. NOTE: This medicine is only for you. Do not share this medicine with  others. What if I miss a dose? This does not apply. This medication is not for regular use. What may interact with this medication? Medications for cancer chemotherapy Medications that suppress your immune function Steroid medications, such as prednisone or cortisone This list may not describe all possible interactions. Give your health care provider a list of all the medicines, herbs, non-prescription drugs, or dietary supplements you use. Also tell them if you smoke, drink alcohol, or use illegal drugs. Some items may interact with your medicine. What should I watch for while using this medication? Visit your care team regularly. Report any side effects to your care team right away. This vaccine, like all vaccines, may not fully protect everyone. What side effects may I notice from receiving this medication? Side effects that you should report to your care team as soon as possible: Allergic reactions--skin rash, itching, hives, swelling of the face, lips, tongue, or throat Side effects that usually do not require medical attention (report these to your care team if they continue or are bothersome): Fatigue Fever Headache Joint pain Muscle pain Pain, redness, or irritation at injection site This list may not describe all possible side effects. Call your doctor for medical advice about side effects. You may report side effects to FDA at 1-800-FDA-1088. Where should I keep my medication? This vaccine is only given by your care team. It will not be stored at home. NOTE: This sheet is a summary. It may not cover all possible information. If you have questions about this medicine, talk to your doctor, pharmacist, or health care provider.  2023 Elsevier/Gold Standard (2022-03-17 00:00:00)

## 2022-10-03 LAB — MICROALBUMIN / CREATININE URINE RATIO
Creatinine, Urine: 152 mg/dL (ref 20–320)
Microalb Creat Ratio: 41 mcg/mg creat — ABNORMAL HIGH (ref <30)
Microalb, Ur: 6.3 mg/dL

## 2022-10-06 ENCOUNTER — Encounter: Payer: Self-pay | Admitting: Internal Medicine

## 2022-10-15 ENCOUNTER — Encounter: Payer: Self-pay | Admitting: Internal Medicine

## 2022-10-21 ENCOUNTER — Encounter: Payer: Self-pay | Admitting: *Deleted

## 2022-10-29 ENCOUNTER — Other Ambulatory Visit: Payer: Self-pay | Admitting: Urology

## 2022-10-29 DIAGNOSIS — N529 Male erectile dysfunction, unspecified: Secondary | ICD-10-CM

## 2022-12-10 ENCOUNTER — Encounter: Payer: Self-pay | Admitting: *Deleted

## 2022-12-22 ENCOUNTER — Telehealth: Payer: Self-pay | Admitting: *Deleted

## 2022-12-22 ENCOUNTER — Other Ambulatory Visit: Payer: Self-pay | Admitting: *Deleted

## 2022-12-22 DIAGNOSIS — Z86010 Personal history of colonic polyps: Secondary | ICD-10-CM

## 2022-12-22 DIAGNOSIS — Z1211 Encounter for screening for malignant neoplasm of colon: Secondary | ICD-10-CM

## 2022-12-22 NOTE — Telephone Encounter (Signed)
Gastroenterology Pre-Procedure Review  Request Date: 02/26/2023 Requesting Physician: Dr. Vicente Males  PATIENT REVIEW QUESTIONS: The patient responded to the following health history questions as indicated:    1. Are you having any GI issues? no 2. Do you have a personal history of Polyps? yes (02/18/2018) 3. Do you have a family history of Colon Cancer or Polyps? no 4. Diabetes Mellitus? yes (metformin) 5. Joint replacements in the past 12 months?no 6. Major health problems in the past 3 months?no 7. Any artificial heart valves, MVP, or defibrillator?no    MEDICATIONS & ALLERGIES:    Patient reports the following regarding taking any anticoagulation/antiplatelet therapy:   Plavix, Coumadin, Eliquis, Xarelto, Lovenox, Pradaxa, Brilinta, or Effient? no Aspirin? no  Patient confirms/reports the following medications:  Current Outpatient Medications  Medication Sig Dispense Refill   amLODipine (NORVASC) 2.5 MG tablet Take 1 tablet (2.5 mg total) by mouth daily. 90 tablet 1   aspirin EC 81 MG tablet Take 1 tablet (81 mg total) by mouth every morning. Resume 4 days post-op     atorvastatin (LIPITOR) 10 MG tablet Take 1 tablet (10 mg total) by mouth daily. 90 tablet 1   carvedilol (COREG) 12.5 MG tablet Take 1 tablet (12.5 mg total) by mouth 2 (two) times daily with a meal. 180 tablet 1   clomiPHENE (CLOMID) 50 MG tablet Take 1/2 tablet daily 30 tablet 3   diclofenac Sodium (VOLTAREN) 1 % GEL      gabapentin (NEURONTIN) 600 MG tablet Take 1 tablet (600 mg total) by mouth 3 (three) times daily. 270 tablet 3   ketoconazole (NIZORAL) 2 % cream Apply topically 2 (two) times daily. 30 g 1   losartan-hydrochlorothiazide (HYZAAR) 100-25 MG tablet Take 1 tablet by mouth daily. 90 tablet 1   metFORMIN (GLUCOPHAGE-XR) 500 MG 24 hr tablet TAKE 2 TABLETS BY MOUTH EVERY DAY WITH BREAKFAST 180 tablet 1   sildenafil (VIAGRA) 100 MG tablet TAKE 1 TABLET BY MOUTH DAILY AS NEEDED FOR ERECTILE DYSFUNCTION 30 tablet 3    VITAMIN D, ERGOCALCIFEROL, PO Take 5,000 Units by mouth daily.     No current facility-administered medications for this visit.    Patient confirms/reports the following allergies:  No Known Allergies  No orders of the defined types were placed in this encounter.   AUTHORIZATION INFORMATION Primary Insurance: 1D#: Group #:  Secondary Insurance: 1D#: Group #:  SCHEDULE INFORMATION: Date: 02/26/2023 Time: Location: [place]

## 2022-12-23 MED ORDER — NA SULFATE-K SULFATE-MG SULF 17.5-3.13-1.6 GM/177ML PO SOLN: 1.0000 | Freq: Once | ORAL | 0 refills | Status: AC

## 2022-12-27 ENCOUNTER — Encounter: Payer: Self-pay | Admitting: Internal Medicine

## 2023-01-01 ENCOUNTER — Telehealth (INDEPENDENT_AMBULATORY_CARE_PROVIDER_SITE_OTHER): Payer: BC Managed Care – PPO | Admitting: Family Medicine

## 2023-01-01 DIAGNOSIS — H109 Unspecified conjunctivitis: Secondary | ICD-10-CM | POA: Diagnosis not present

## 2023-01-01 MED ORDER — ERYTHROMYCIN 5 MG/GM OP OINT: 1.0000 | TOPICAL_OINTMENT | Freq: Two times a day (BID) | OPHTHALMIC | 0 refills | Status: DC

## 2023-01-01 MED ORDER — CROMOLYN SODIUM 4 % OP SOLN: 1.0000 [drp] | Freq: Four times a day (QID) | OPHTHALMIC | 2 refills | Status: DC | PRN

## 2023-01-01 NOTE — Patient Instructions (Addendum)
   I would try the allergy eye drops first and see if that helps calm down the inflammation and irritation. And continue your eye drops from Dr. Gloriann Loan  If the allergy drops do not work then please start the antibiotics and follow up if not getting better in ~ 5 days    Lumify drops are really effective for comfort and to clear redness - but they won't treat the underlying cause

## 2023-01-01 NOTE — Progress Notes (Signed)
Name: Marc Stevens   MRN: [id number]    DOB: 02-01-1961   Date:01/01/2023       Progress Note  Subjective:    Chief Complaint  Chief Complaint  Patient presents with   Eye Problem    Left eye onset for 2 weeks just red around    I connected with  [name]  on 01/01/23 at 10:20 AM EDT by a video enabled telemedicine application and verified that I am speaking with the correct person using two identifiers.  I discussed the limitations of evaluation and management by telemedicine and the availability of in person appointments. The patient expressed understanding and agreed to proceed. Staff also discussed with the patient that there may be a patient responsible charge related to this service. Patient Location: home Provider Location: home office Additional Individuals present: none  Eye Problem  The left eye is affected. This is a new problem. Episode onset: 2 weeks. The problem occurs constantly. The problem has been unchanged. There was no injury mechanism (no itching, FB, contacts, no allergies or URI sx). The pain is mild. There is No known exposure to pink eye. He Does not wear contacts. Associated symptoms include eye redness, a foreign body sensation (feels like something is there in the lateral corner) and photophobia (a few mild episodes of light irritating his eye, but not currently). Pertinent negatives include no blurred vision, eye discharge, double vision, fever, itching, nausea, recent URI or vomiting. Treatments tried: his systane drops. The treatment provided no relief.   Red eye onset 2-3 weeks ago, was just there one morning when he woke up   Patient Active Problem List   Diagnosis Date Noted   Neutropenia, unspecified type ([place]) 02/14/2021   Benign essential hypertension 11/07/2019   Proteinuria 11/07/2019   Type 2 diabetes mellitus with other diabetic kidney complication ([place]) 0000000   Lymphedema 06/16/2018   Arthritis of hand 12/07/2017    Pre-diabetes 08/13/2017   Mitral regurgitation 01/17/2016   LVH (left ventricular hypertrophy) 01/01/2016   Low testosterone 05/14/2015   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   Vitamin D deficiency 04/26/2015   Beta-thalassemia ([place]) 04/26/2015   Class 2 severe obesity with serious comorbidity and body mass index (BMI) of 37.0 to 37.9 in adult [facility])    ED (erectile dysfunction)    Hyperlipidemia    OSA (obstructive sleep apnea)    HTN (hypertension) 04/18/2012    Social History   Tobacco Use   Smoking status: Former    Packs/day: 0.50    Years: 30.00    Additional pack years: 0.00    Total pack years: 15.00    Types: Cigarettes    Quit date: 10/19/2004    Years since quitting: 18.2   Smokeless tobacco: Never  Substance Use Topics   Alcohol use: Yes    Alcohol/week: 4.0 standard drinks of alcohol    Types: 4 Shots of liquor per week    Comment: occasional     Current Outpatient Medications:    amLODipine (NORVASC) 2.5 MG tablet, Take 1 tablet (2.5 mg total) by mouth daily., Disp: 90 tablet, Rfl: 1   aspirin EC 81 MG tablet, Take 1 tablet (81 mg total) by mouth every morning. Resume 4 days post-op, Disp: , Rfl:    atorvastatin (LIPITOR) 10 MG tablet, Take 1 tablet (10 mg total) by mouth daily., Disp: 90 tablet, Rfl: 1   carvedilol (COREG) 12.5 MG tablet, Take 1 tablet (12.5 mg total) by  mouth 2 (two) times daily with a meal., Disp: 180 tablet, Rfl: 1   clomiPHENE (CLOMID) 50 MG tablet, Take 1/2 tablet daily, Disp: 30 tablet, Rfl: 3   diclofenac Sodium (VOLTAREN) 1 % GEL, , Disp: , Rfl:    gabapentin (NEURONTIN) 600 MG tablet, Take 1 tablet (600 mg total) by mouth 3 (three) times daily., Disp: 270 tablet, Rfl: 3   ketoconazole (NIZORAL) 2 % cream, Apply topically 2 (two) times daily., Disp: 30 g, Rfl: 1   losartan-hydrochlorothiazide (HYZAAR) 100-25 MG tablet, Take 1 tablet by mouth daily., Disp: 90 tablet, Rfl: 1   metFORMIN (GLUCOPHAGE-XR) 500 MG 24 hr  tablet, TAKE 2 TABLETS BY MOUTH EVERY DAY WITH BREAKFAST, Disp: 180 tablet, Rfl: 1   sildenafil (VIAGRA) 100 MG tablet, TAKE 1 TABLET BY MOUTH DAILY AS NEEDED FOR ERECTILE DYSFUNCTION, Disp: 30 tablet, Rfl: 3   VITAMIN D, ERGOCALCIFEROL, PO, Take 5,000 Units by mouth daily., Disp: , Rfl:    Na Sulfate-K Sulfate-Mg Sulf 17.5-3.13-1.6 GM/177ML SOLN, Take by mouth. (Patient not taking: Reported on 01/01/2023), Disp: , Rfl:   No Known Allergies  I personally reviewed active problem list, medication list, allergies, family history, social history, health maintenance, notes from last encounter, lab results, imaging with the patient/caregiver today.   Review of Systems  Constitutional: Negative.  Negative for fever.  HENT: Negative.    Eyes:  Positive for photophobia (a few mild episodes of light irritating his eye, but not currently) and redness. Negative for blurred vision, double vision, discharge and itching.  Respiratory: Negative.    Cardiovascular: Negative.   Gastrointestinal: Negative.  Negative for nausea and vomiting.  Endocrine: Negative.   Genitourinary: Negative.   Musculoskeletal: Negative.   Skin: Negative.   Allergic/Immunologic: Negative.   Neurological: Negative.   Hematological: Negative.   Psychiatric/Behavioral: Negative.    All other systems reviewed and are negative.     Objective:   Virtual encounter, vitals limited, only able to obtain the following There were no vitals filed for this visit. There is no height or weight on file to calculate BMI. Nursing Note and Vital Signs reviewed.  Physical Exam Vitals reviewed.  HENT:     Head: Normocephalic and atraumatic.     Nose: Nose normal.  Eyes:     General: Lids are normal.        Right eye: No discharge.        Left eye: No discharge.     Extraocular Movements:     Right eye: Normal extraocular motion.     Left eye: Normal extraocular motion.     Conjunctiva/sclera:     Left eye: Left conjunctiva is  injected.  Neurological:     Mental Status: He is alert.     PE limited by virtual encounter  No results found for this or any previous visit (from the past 72 hour(s)).  Assessment and Plan:     ICD-10-CM   1. Conjunctivitis, unspecified conjunctivitis type, unspecified laterality  H10.9 cromolyn (OPTICROM) 4 % ophthalmic solution     Spontaneous left eye redness about 2 weeks ago, try allergy eye drops first to see if they help and if no improvement will tx ith erythromycin ointment Red flags reviewed with pt - severe eye pain, decreased vision, swelling around eye He does have appt soon with his eye doctor  -Red flags and when to present for emergency care or RTC including fever >101.12F, chest pain, shortness of breath, new/worsening/un-resolving symptoms, reviewed with patient at  time of visit. Follow up and care instructions discussed and provided in AVS. - I discussed the assessment and treatment plan with the patient. The patient was provided an opportunity to ask questions and all were answered. The patient agreed with the plan and demonstrated an understanding of the instructions.  I provided 15 minutes of non-face-to-face time during this encounter.  Delsa Grana, PA-C 01/01/23 10:29 AM

## 2023-01-19 ENCOUNTER — Telehealth: Payer: Self-pay | Admitting: *Deleted

## 2023-01-19 NOTE — Telephone Encounter (Signed)
Left a message for patient this morning to reschedule colonoscopy 02/26/2023 due to Dr Vicente Males will be out of the office.  Patient called back and we have reschedule to 03/05/2023 for now. If we need to change, patient will call back.  New instructions will be sent.  Patient verbalized understanding.

## 2023-02-04 ENCOUNTER — Other Ambulatory Visit: Payer: Self-pay | Admitting: Internal Medicine

## 2023-02-04 DIAGNOSIS — E782 Mixed hyperlipidemia: Secondary | ICD-10-CM

## 2023-02-04 NOTE — Telephone Encounter (Signed)
Rx was sent to pharmacy on 10/02/22 #90/1 RF.   Requested Prescriptions  Refused Prescriptions Disp Refills   atorvastatin (LIPITOR) 10 MG tablet [Pharmacy Med Name: ATORVASTATIN 10 MG TABLET] 90 tablet 1    Sig: TAKE 1 TABLET BY MOUTH EVERY DAY     Cardiovascular:  Antilipid - Statins Failed - 02/04/2023  9:26 AM      Failed - Lipid Panel in normal range within the last 12 months    Cholesterol, Total  Date Value Ref Range Status  02/21/2016 137 100 - 199 mg/dL Final   Cholesterol  Date Value Ref Range Status  12/25/2021 131 <200 mg/dL Final   LDL Cholesterol (Calc)  Date Value Ref Range Status  12/25/2021 66 mg/dL (calc) Final    Comment:    Reference range: <100 . Desirable range <100 mg/dL for primary prevention;   <70 mg/dL for patients with CHD or diabetic patients  with > or = 2 CHD risk factors. Marland Kitchen LDL-C is now calculated using the Martin-Hopkins  calculation, which is a validated novel method providing  better accuracy than the Friedewald equation in the  estimation of LDL-C.  Horald Pollen et al. Lenox Ahr. 1610;960(45): 2061-2068  (http://education.[employer].com/faq/FAQ164)    HDL  Date Value Ref Range Status  12/25/2021 50 > OR = 40 mg/dL Final  40/98/1191 48 >47 mg/dL Final   Triglycerides  Date Value Ref Range Status  12/25/2021 69 <150 mg/dL Final         Passed - Patient is not pregnant      Passed - Valid encounter within last 12 months    Recent Outpatient Visits           1 month ago Conjunctivitis, unspecified conjunctivitis type, unspecified laterality   [facility] Danelle Berry, PA-C   4 months ago Primary hypertension   [facility] Margarita Mail, DO   10 months ago Pre-diabetes   [facility] Margarita Mail, DO   1 year ago Neck mass   [facility] Danelle Berry, PA-C   1 year ago Routine general medical examination at a  health care facility   [facility] Margarita Mail, DO       Future Appointments             In 1 month Danelle Berry, PA-C [facility]   In 4 months [place], Elana [facility] [place]

## 2023-03-05 ENCOUNTER — Ambulatory Visit
Admission: RE | Admit: 2023-03-05 | Discharge: 2023-03-05 | Disposition: A | Discharge status: Home / Self Care | Payer: BC Managed Care – PPO | Source: Ambulatory Visit | Attending: Gastroenterology | Admitting: Gastroenterology

## 2023-03-05 ENCOUNTER — Encounter
Admission: RE | Disposition: A | Discharge status: Home / Self Care | Payer: Self-pay | Source: Ambulatory Visit | Attending: Gastroenterology

## 2023-03-05 ENCOUNTER — Ambulatory Visit: Payer: BC Managed Care – PPO | Admitting: Certified Registered Nurse Anesthetist

## 2023-03-05 ENCOUNTER — Encounter: Payer: Self-pay | Admitting: Gastroenterology

## 2023-03-05 DIAGNOSIS — Z87891 Personal history of nicotine dependence: Secondary | ICD-10-CM | POA: Diagnosis not present

## 2023-03-05 DIAGNOSIS — E785 Hyperlipidemia, unspecified: Secondary | ICD-10-CM | POA: Insufficient documentation

## 2023-03-05 DIAGNOSIS — Z79899 Other long term (current) drug therapy: Secondary | ICD-10-CM | POA: Diagnosis not present

## 2023-03-05 DIAGNOSIS — Z860100 Personal history of colon polyps, unspecified: Secondary | ICD-10-CM

## 2023-03-05 DIAGNOSIS — Z6837 Body mass index (BMI) 37.0-37.9, adult: Secondary | ICD-10-CM | POA: Insufficient documentation

## 2023-03-05 DIAGNOSIS — N529 Male erectile dysfunction, unspecified: Secondary | ICD-10-CM | POA: Diagnosis not present

## 2023-03-05 DIAGNOSIS — D126 Benign neoplasm of colon, unspecified: Secondary | ICD-10-CM

## 2023-03-05 DIAGNOSIS — G4733 Obstructive sleep apnea (adult) (pediatric): Secondary | ICD-10-CM | POA: Insufficient documentation

## 2023-03-05 DIAGNOSIS — Z1211 Encounter for screening for malignant neoplasm of colon: Secondary | ICD-10-CM | POA: Diagnosis present

## 2023-03-05 DIAGNOSIS — D122 Benign neoplasm of ascending colon: Secondary | ICD-10-CM | POA: Insufficient documentation

## 2023-03-05 DIAGNOSIS — E119 Type 2 diabetes mellitus without complications: Secondary | ICD-10-CM | POA: Diagnosis not present

## 2023-03-05 DIAGNOSIS — K5730 Diverticulosis of large intestine without perforation or abscess without bleeding: Secondary | ICD-10-CM | POA: Insufficient documentation

## 2023-03-05 DIAGNOSIS — I10 Essential (primary) hypertension: Secondary | ICD-10-CM | POA: Diagnosis not present

## 2023-03-05 DIAGNOSIS — R7989 Other specified abnormal findings of blood chemistry: Secondary | ICD-10-CM | POA: Insufficient documentation

## 2023-03-05 DIAGNOSIS — Z7984 Long term (current) use of oral hypoglycemic drugs: Secondary | ICD-10-CM | POA: Diagnosis not present

## 2023-03-05 HISTORY — PX: COLONOSCOPY WITH PROPOFOL: SHX5780

## 2023-03-05 LAB — GLUCOSE, CAPILLARY: Glucose-Capillary: 81 mg/dL (ref 70–99)

## 2023-03-05 SURGERY — COLONOSCOPY WITH PROPOFOL
Anesthesia: General

## 2023-03-05 MED ORDER — STERILE WATER FOR IRRIGATION IR SOLN
Status: DC | PRN
  Administered 2023-03-05: 60 mL

## 2023-03-05 MED ORDER — LIDOCAINE HCL (CARDIAC) PF 100 MG/5ML IV SOSY
PREFILLED_SYRINGE | INTRAVENOUS | Status: DC | PRN
  Administered 2023-03-05: 50 mg via INTRAVENOUS

## 2023-03-05 MED ORDER — PROPOFOL 10 MG/ML IV BOLUS
INTRAVENOUS | Status: DC | PRN
  Administered 2023-03-05: 30 mg via INTRAVENOUS
  Administered 2023-03-05: 50 mg via INTRAVENOUS

## 2023-03-05 MED ORDER — PROPOFOL 500 MG/50ML IV EMUL
INTRAVENOUS | Status: DC | PRN
  Administered 2023-03-05: 150 ug/kg/min via INTRAVENOUS

## 2023-03-05 MED ORDER — SODIUM CHLORIDE 0.9 % IV SOLN
INTRAVENOUS | Status: DC
  Administered 2023-03-05: 20 mL/h via INTRAVENOUS

## 2023-03-05 NOTE — Anesthesia Preprocedure Evaluation (Signed)
Anesthesia Evaluation  Patient identified by MRN, date of birth, ID band Patient awake    Reviewed: Allergy & Precautions, H&P , NPO status , Patient's Chart, lab work & pertinent test results, reviewed documented beta blocker date and time   History of Anesthesia Complications Negative for: history of anesthetic complications  Airway Mallampati: II   Neck ROM: full    Dental  (+) Teeth Intact, Dental Advidsory Given, Partial Upper   Pulmonary neg shortness of breath, sleep apnea and Continuous Positive Airway Pressure Ventilation , neg COPD, neg recent URI, former smoker   Pulmonary exam normal        Cardiovascular Exercise Tolerance: Good hypertension, On Medications (-) angina (-) Past MI and (-) Cardiac Stents Normal cardiovascular exam(-) dysrhythmias (-) Valvular Problems/Murmurs Rhythm:regular Rate:Normal     Neuro/Psych negative neurological ROS  negative psych ROS   GI/Hepatic negative GI ROS, Neg liver ROS,,,  Endo/Other  diabetes    Renal/GU negative Renal ROS  negative genitourinary   Musculoskeletal   Abdominal   Peds  Hematology negative hematology ROS (+) Blood dyscrasia, anemia   Anesthesia Other Findings Past Medical History: No date: BPH (benign prostatic hypertrophy) No date: Diabetes mellitus without complication (HCC) No date: ED (erectile dysfunction) 04/18/2012: HTN (hypertension) No date: Hyperlipidemia No date: Low testosterone No date: Morbidly obese (HCC) No date: OSA (obstructive sleep apnea)     Comment:  cpap - does not know settings  No date: Rotator cuff tear     Comment:  RT 07/11/2015: Torn Achilles tendon Past Surgical History: 2013: DOPPLER ECHOCARDIOGRAPHY No date: left kene arthroscopy  01/30/2015: LUMBAR LAMINECTOMY/DECOMPRESSION MICRODISCECTOMY; N/A     Comment:  Procedure: MICRO LUMBAR DECOMPRESSION, BILATERAL               FORAMINOTOMIES, MICRODISCECTOMY LUMBAR FOUR  TO LUMBAR               FIVE;  Surgeon: Jene Every, MD;  Location: [facility];                Service: Orthopedics;  Laterality: N/A; No date: right knee surgery 2007: ROTATOR CUFF REPAIR     Comment:  Left 08/08/2015: SHOULDER OPEN ROTATOR CUFF REPAIR; Right     Comment:  Procedure: RIGHT SHOULDER MINI OPEN ROTATOR CUFF REPAIR               SUBACROMIAL DECOMPRESSION ;  Surgeon: Jene Every, MD;               Location: [facility];  Service: Orthopedics;  Laterality:               Right;   Reproductive/Obstetrics negative OB ROS                             Anesthesia Physical Anesthesia Plan  ASA: 2  Anesthesia Plan: General   Post-op Pain Management:    Induction: Intravenous  PONV Risk Score and Plan: 2 and Propofol infusion and TIVA  Airway Management Planned: Natural Airway and Nasal Cannula  Additional Equipment:   Intra-op Plan:   Post-operative Plan:   Informed Consent: I have reviewed the patients History and Physical, chart, labs and discussed the procedure including the risks, benefits and alternatives for the proposed anesthesia with the patient or authorized representative who has indicated his/her understanding and acceptance.     Dental Advisory Given  Plan Discussed with: CRNA  Anesthesia Plan Comments:  Anesthesia Quick Evaluation

## 2023-03-05 NOTE — Op Note (Signed)
[facility] Patient Name: Marc Stevens Procedure Date: 03/05/2023 7:25 AM MRN: [id number] Account #: [id number] Date of Birth: [birth year] Admit Type: Outpatient Age: 62 Room: [facility] ENDO ROOM 4 Gender: Male Note Status: Finalized Instrument Name: Prentice Docker [id number] Procedure:             Colonoscopy Indications:           Surveillance: Personal history of adenomatous polyps                         on last colonoscopy 5 years ago Providers:             Wyline Mood MD, MD Referring MD:          Danelle Berry (Referring MD) Medicines:             Monitored Anesthesia Care Complications:         No immediate complications. Procedure:             Pre-Anesthesia Assessment:                        - Prior to the procedure, a History and Physical was                         performed, and patient medications, allergies and                         sensitivities were reviewed. The patient's tolerance                         of previous anesthesia was reviewed.                        - The risks and benefits of the procedure and the                         sedation options and risks were discussed with the                         patient. All questions were answered and informed                         consent was obtained.                        - ASA Grade Assessment: II - A patient with mild                         systemic disease.                        After obtaining informed consent, the colonoscope was                         passed under direct vision. Throughout the procedure,                         the patient's blood pressure, pulse, and oxygen                         saturations were  monitored continuously. The                         Colonoscope was introduced through the anus and                         advanced to the the cecum, identified by the                         appendiceal orifice. The colonoscopy was performed                          with ease. The patient tolerated the procedure well.                         The quality of the bowel preparation was excellent.                         The ileocecal valve, appendiceal orifice, and rectum                         were photographed. Findings:      The perianal and digital rectal examinations were normal.      Multiple small-mouthed diverticula were found in the sigmoid colon.      A 7 mm polyp was found in the ascending colon. The polyp was sessile.       The polyp was removed with a cold snare. Resection and retrieval were       complete.      The exam was otherwise without abnormality on direct and retroflexion       views. Impression:            - Diverticulosis in the sigmoid colon.                        - One 7 mm polyp in the ascending colon, removed with                         a cold snare. Resected and retrieved.                        - The examination was otherwise normal on direct and                         retroflexion views. Recommendation:        - Discharge patient to home (with escort).                        - Resume previous diet.                        - Continue present medications.                        - Await pathology results.                        - Repeat colonoscopy in 7 years for surveillance based  on pathology results. Procedure Code(s):     --- Professional ---                        [phone], Colonoscopy, flexible; with removal of                         tumor(s), polyp(s), or other lesion(s) by snare                         technique Diagnosis Code(s):     --- Professional ---                        Z86.010, Personal history of colonic polyps                        D12.2, Benign neoplasm of ascending colon                        K57.30, Diverticulosis of large intestine without                         perforation or abscess without bleeding CPT copyright 2022 American Medical Association. All rights  reserved. The codes documented in this report are preliminary and upon coder review may  be revised to meet current compliance requirements. Wyline Mood, MD Wyline Mood MD, MD 03/05/2023 8:06:37 AM This report has been signed electronically. Number of Addenda: 0 Note Initiated On: 03/05/2023 7:25 AM Total Procedure Duration: 0 hours 13 minutes 48 seconds  Estimated Blood Loss:  Estimated blood loss: none.      [facility]

## 2023-03-05 NOTE — H&P (Signed)
Wyline Mood, MD [address], [address], [place], [place], [address] [address], [place], [place], [address] Phone: [phone]  Fax: [phone]  Primary Care Physician:  Danelle Berry, PA-C   Pre-Procedure History & Physical: HPI:  Marc Stevens is a 62 y.o. male is here for an colonoscopy.   Past Medical History:  Diagnosis Date   BPH (benign prostatic hypertrophy)    Complete rotator cuff tear 08/08/2015   Diabetes mellitus without complication (HCC)    ED (erectile dysfunction)    HTN (hypertension) 04/18/2012   Hyperlipidemia    Low testosterone    Morbidly obese (HCC)    OSA (obstructive sleep apnea)    cpap - does not know settings    Rotator cuff tear    RT   Torn Achilles tendon 07/11/2015   Torn Achilles tendon 07/11/2015    Past Surgical History:  Procedure Laterality Date   COLONOSCOPY WITH PROPOFOL N/A 02/18/2018   Procedure: COLONOSCOPY WITH PROPOFOL;  Surgeon: Wyline Mood, MD;  Location: [facility] END[facility]COPY;  Service: Gastroenterology;  Laterality: N/A;   DOPPLER ECHOCARDIOGRAPHY  2013   left kene arthroscopy      LUMBAR LAMINECTOMY/DECOMPRESSION MICRODISCECTOMY N/A 01/30/2015   Procedure: MICRO LUMBAR DECOMPRESSION, BILATERAL FORAMINOTOMIES, MICRODISCECTOMY LUMBAR FOUR TO LUMBAR FIVE;  Surgeon: Jene Every, MD;  Location: [facility];  Service: Orthopedics;  Laterality: N/A;   right knee surgery     ROTATOR CUFF REPAIR  2007   Left   SHOULDER OPEN ROTATOR CUFF REPAIR Right 08/08/2015   Procedure: RIGHT SHOULDER MINI OPEN ROTATOR CUFF REPAIR SUBACROMIAL DECOMPRESSION ;  Surgeon: Jene Every, MD;  Location: [facility];  Service: Orthopedics;  Laterality: Right;    Prior to Admission medications   Medication Sig Start Date End Date Taking? Authorizing Provider  amLODipine (NORVASC) 2.5 MG tablet Take 1 tablet (2.5 mg total) by mouth daily. 10/02/22  Yes Margarita Mail, DO  aspirin EC 81 MG tablet Take 1 tablet (81 mg total) by mouth every morning.  Resume 4 days post-op 02/01/15  Yes Andrez Grime M, PA-C  atorvastatin (LIPITOR) 10 MG tablet Take 1 tablet (10 mg total) by mouth daily. 10/02/22  Yes Margarita Mail, DO  carvedilol (COREG) 12.5 MG tablet Take 1 tablet (12.5 mg total) by mouth 2 (two) times daily with a meal. 10/02/22  Yes Margarita Mail, DO  clomiPHENE (CLOMID) 50 MG tablet Take 1/2 tablet daily 07/01/22  Yes McGowan, Carollee Herter A, PA-C  cromolyn (OPTICROM) 4 % ophthalmic solution Place 1 drop into both eyes 4 (four) times daily as needed. 01/01/23  Yes Danelle Berry, PA-C  diclofenac Sodium (VOLTAREN) 1 % GEL    Yes [provider]  erythromycin ophthalmic ointment Place 1 Application into the left eye 2 (two) times daily. Place 1/2 inch ribbon of ointment in the affected eye 4 times a day 01/01/23  Yes Danelle Berry, PA-C  gabapentin (NEURONTIN) 600 MG tablet Take 1 tablet (600 mg total) by mouth 3 (three) times daily. 10/02/22  Yes Margarita Mail, DO  ketoconazole (NIZORAL) 2 % cream Apply topically 2 (two) times daily. 11/11/21  Yes Mecum, Erin E, PA-C  losartan-hydrochlorothiazide (HYZAAR) 100-25 MG tablet Take 1 tablet by mouth daily. 10/02/22  Yes Margarita Mail, DO  metFORMIN (GLUCOPHAGE-XR) 500 MG 24 hr tablet TAKE 2 TABLETS BY MOUTH EVERY DAY WITH BREAKFAST 10/02/22  Yes Margarita Mail, DO  sildenafil (VIAGRA) 100 MG tablet TAKE 1 TABLET BY MOUTH DAILY AS NEEDED FOR ERECTILE DYSFUNCTION 10/30/22  Yes McGowan,  Shannon A, PA-C  Na Sulfate-K Sulfate-Mg Sulf 17.5-3.13-1.6 GM/177ML SOLN Take by mouth. Patient not taking: Reported on 01/01/2023 12/23/22   [provider]  VITAMIN D, ERGOCALCIFEROL, PO Take 5,000 Units by mouth daily. Patient not taking: Reported on 03/05/2023    [provider]    Allergies as of 12/23/2022   (No Known Allergies)    Family History  Problem Relation Age of Onset   Dementia Mother    Hypertension Mother    Cancer Father        colon   Heart disease  Father    Congestive Heart Failure Father    Hypertension Sister    Dementia Maternal Grandmother    Kidney disease Neg Hx    Prostate cancer Neg Hx    COPD Neg Hx    Diabetes Neg Hx    Stroke Neg Hx    Kidney cancer Neg Hx    Bladder Cancer Neg Hx     Social History   Socioeconomic History   Marital status: Single    Spouse name: Not on file   Number of children: Not on file   Years of education: Not on file   Highest education level: Not on file  Occupational History    Employer: [employer]    Employer: [employer]  Tobacco Use   Smoking status: Former    Packs/day: 0.50    Years: 30.00    Additional pack years: 0.00    Total pack years: 15.00    Types: Cigarettes    Quit date: 10/19/2004    Years since quitting: 18.3   Smokeless tobacco: Never  Vaping Use   Vaping Use: Never used  Substance and Sexual Activity   Alcohol use: Yes    Alcohol/week: 4.0 standard drinks of alcohol    Types: 4 Shots of liquor per week    Comment: occasional   Drug use: No   Sexual activity: Yes    Birth control/protection: None  Other Topics Concern   Not on file  Social History Narrative   Not on file   Social Determinants of Health   Financial Resource Strain: Low Risk  (12/25/2021)   Overall Financial Resource Strain (CARDIA)    Difficulty of Paying Living Expenses: Not hard at all  Food Insecurity: No Food Insecurity (12/25/2021)   Hunger Vital Sign    Worried About Running Out of Food in the Last Year: Never true    Ran Out of Food in the Last Year: Never true  Transportation Needs: No Transportation Needs (12/25/2021)   PRAPARE - Administrator, Civil Service (Medical): No    Lack of Transportation (Non-Medical): No  Physical Activity: Insufficiently Active (12/25/2021)   Exercise Vital Sign    Days of Exercise per Week: 4 days    Minutes of Exercise per Session: 30 min  Stress: No Stress Concern Present (12/25/2021)   [employer] of Occupational Health - Occupational  Stress Questionnaire    Feeling of Stress : Not at all  Social Connections: Socially Isolated (12/25/2021)   Social Connection and Isolation Panel [NHANES]    Frequency of Communication with Friends and Family: More than three times a week    Frequency of Social Gatherings with Friends and Family: Twice a week    Attends Religious Services: Never    Database administrator or Organizations: No    Attends Banker Meetings: Never    Marital Status: Divorced  Catering manager Violence: Not  At Risk (12/25/2021)   Humiliation, Afraid, Rape, and Kick questionnaire    Fear of Current or Ex-Partner: No    Emotionally Abused: No    Physically Abused: No    Sexually Abused: No    Review of Systems: See HPI, otherwise negative ROS  Physical Exam: BP (!) 142/81   Pulse 69   Temp (!) 96 F (35.6 C) (Temporal)   Resp 20   Ht 5\' 11"  (1.803 m)   Wt 121.8 kg   SpO2 96%   BMI 37.46 kg/m  General:   Alert,  pleasant and cooperative in NAD Head:  Normocephalic and atraumatic. Neck:  Supple; no masses or thyromegaly. Lungs:  Clear throughout to auscultation, normal respiratory effort.    Heart:  +S1, +S2, Regular rate and rhythm, No edema. Abdomen:  Soft, nontender and nondistended. Normal bowel sounds, without guarding, and without rebound.   Neurologic:  Alert and  oriented x4;  grossly normal neurologically.  Impression/Plan: Marc Stevens is here for an colonoscopy to be performed for surveillance due to prior history of colon polyps   Risks, benefits, limitations, and alternatives regarding  colonoscopy have been reviewed with the patient.  Questions have been answered.  All parties agreeable.   Wyline Mood, MD  03/05/2023, 7:43 AM

## 2023-03-05 NOTE — Transfer of Care (Signed)
Immediate Anesthesia Transfer of Care Note  Patient: Marc Stevens  Procedure(s) Performed: COLONOSCOPY WITH PROPOFOL  Patient Location: Endoscopy Unit  Anesthesia Type:General  Level of Consciousness: awake, alert , and oriented  Airway & Oxygen Therapy: Patient Spontanous Breathing  Post-op Assessment: Report given to RN and Post -op Vital signs reviewed and stable  Post vital signs: Reviewed and stable  Last Vitals:  Vitals Value Taken Time  BP 96/64 03/05/23 0808  Temp    Pulse 80 03/05/23 0808  Resp 27 03/05/23 0808  SpO2 96 % 03/05/23 0808  Vitals shown include unvalidated device data.  Last Pain:  Vitals:   03/05/23 0647  TempSrc: Temporal  PainSc: 0-No pain         Complications: No notable events documented.

## 2023-03-05 NOTE — Anesthesia Postprocedure Evaluation (Signed)
Anesthesia Post Note  Patient: Marc Stevens  Procedure(s) Performed: COLONOSCOPY WITH PROPOFOL  Patient location during evaluation: Endoscopy Anesthesia Type: General Level of consciousness: awake and alert Pain management: pain level controlled Vital Signs Assessment: post-procedure vital signs reviewed and stable Respiratory status: spontaneous breathing, nonlabored ventilation, respiratory function stable and patient connected to nasal cannula oxygen Cardiovascular status: blood pressure returned to baseline and stable Postop Assessment: no apparent nausea or vomiting Anesthetic complications: no   No notable events documented.   Last Vitals:  Vitals:   03/05/23 0807 03/05/23 0817  BP: 96/64 129/70  Pulse: 78   Resp: 14 20  Temp: (!) 36.3 C   SpO2: 95%     Last Pain:  Vitals:   03/05/23 0827  TempSrc:   PainSc: 0-No pain                 Lenard Simmer

## 2023-03-08 ENCOUNTER — Encounter: Payer: Self-pay | Admitting: Gastroenterology

## 2023-03-09 ENCOUNTER — Encounter: Payer: Self-pay | Admitting: Gastroenterology

## 2023-03-09 LAB — SURGICAL PATHOLOGY

## 2023-03-17 ENCOUNTER — Other Ambulatory Visit: Payer: Self-pay | Admitting: Urology

## 2023-03-17 DIAGNOSIS — N529 Male erectile dysfunction, unspecified: Secondary | ICD-10-CM

## 2023-03-25 ENCOUNTER — Encounter: Payer: Self-pay | Admitting: Family Medicine

## 2023-03-26 MED ORDER — KETOCONAZOLE 2 % EX CREA: TOPICAL_CREAM | Freq: Two times a day (BID) | CUTANEOUS | 1 refills | Status: DC

## 2023-03-31 ENCOUNTER — Other Ambulatory Visit: Payer: Self-pay | Admitting: Internal Medicine

## 2023-03-31 DIAGNOSIS — E782 Mixed hyperlipidemia: Secondary | ICD-10-CM

## 2023-03-31 NOTE — Telephone Encounter (Signed)
Requested medication (s) are due for refill today: Due 04/03/23  Requested medication (s) are on the active medication list: yes    Last refill: 10/02/22  #90  1 refill  Future visit scheduled  Yes 04/09/23  Notes to clinic Failed due to labs, please review. Thank you.  Requested Prescriptions  Pending Prescriptions Disp Refills   atorvastatin (LIPITOR) 10 MG tablet [Pharmacy Med Name: ATORVASTATIN 10 MG TABLET] 90 tablet 2    Sig: TAKE 1 TABLET BY MOUTH EVERY DAY     Cardiovascular:  Antilipid - Statins Failed - 03/31/2023  2:00 AM      Failed - Lipid Panel in normal range within the last 12 months    Cholesterol, Total  Date Value Ref Range Status  02/21/2016 137 100 - 199 mg/dL Final   Cholesterol  Date Value Ref Range Status  12/25/2021 131 <200 mg/dL Final   LDL Cholesterol (Calc)  Date Value Ref Range Status  12/25/2021 66 mg/dL (calc) Final    Comment:    Reference range: <100 . Desirable range <100 mg/dL for primary prevention;   <70 mg/dL for patients with CHD or diabetic patients  with > or = 2 CHD risk factors. Marland Kitchen LDL-C is now calculated using the Martin-Hopkins  calculation, which is a validated novel method providing  better accuracy than the Friedewald equation in the  estimation of LDL-C.  Horald Pollen et al. Lenox Ahr. 1610;960(45): 2061-2068  (http://education.[facility].com/faq/FAQ164)    HDL  Date Value Ref Range Status  12/25/2021 50 > OR = 40 mg/dL Final  40/98/1191 48 >47 mg/dL Final   Triglycerides  Date Value Ref Range Status  12/25/2021 69 <150 mg/dL Final         Passed - Patient is not pregnant      Passed - Valid encounter within last 12 months    Recent Outpatient Visits           2 months ago Conjunctivitis, unspecified conjunctivitis type, unspecified laterality   [facility] Danelle Berry, PA-C   6 months ago Primary hypertension   [facility] Margarita Mail, DO   12  months ago Pre-diabetes   [facility] Margarita Mail, DO   1 year ago Neck mass   [facility] Danelle Berry, PA-C   1 year ago Routine general medical examination at a health care facility   [facility] Margarita Mail, DO       Future Appointments             In 1 week Danelle Berry, PA-C [facility]   In 2 months McGowan, Elana Alm [facility] [place]

## 2023-04-02 ENCOUNTER — Ambulatory Visit: Payer: BC Managed Care – PPO | Admitting: Family Medicine

## 2023-04-04 ENCOUNTER — Other Ambulatory Visit: Payer: Self-pay | Admitting: Internal Medicine

## 2023-04-04 DIAGNOSIS — I10 Essential (primary) hypertension: Secondary | ICD-10-CM

## 2023-04-05 NOTE — Telephone Encounter (Signed)
Requested Prescriptions  Pending Prescriptions Disp Refills   amLODipine (NORVASC) 2.5 MG tablet [Pharmacy Med Name: AMLODIPINE BESYLATE 2.5 MG TAB] 90 tablet 0    Sig: TAKE 1 TABLET BY MOUTH EVERY DAY     Cardiovascular: Calcium Channel Blockers 2 Passed - 04/04/2023  8:38 AM      Passed - Last BP in normal range    BP Readings from Last 1 Encounters:  03/05/23 129/70         Passed - Last Heart Rate in normal range    Pulse Readings from Last 1 Encounters:  03/05/23 78         Passed - Valid encounter within last 6 months    Recent Outpatient Visits           3 months ago Conjunctivitis, unspecified conjunctivitis type, unspecified laterality   [facility] Danelle Berry, PA-C   6 months ago Primary hypertension   [facility] Margarita Mail, DO   1 year ago Pre-diabetes   [facility] Margarita Mail, DO   1 year ago Neck mass   [facility] Danelle Berry, PA-C   1 year ago Routine general medical examination at a health care facility   [facility] Margarita Mail, DO       Future Appointments             In 4 days Danelle Berry, PA-C [facility]   In 2 months McGowan, Elana Alm [facility] [place]

## 2023-04-09 ENCOUNTER — Encounter: Payer: Self-pay | Admitting: Family Medicine

## 2023-04-09 ENCOUNTER — Ambulatory Visit: Payer: BC Managed Care – PPO | Admitting: Family Medicine

## 2023-04-09 VITALS — BP 118/70 | HR 79 | Temp 97.9°F | Resp 16 | Ht 71.0 in | Wt 274.9 lb

## 2023-04-09 DIAGNOSIS — I10 Essential (primary) hypertension: Secondary | ICD-10-CM | POA: Diagnosis not present

## 2023-04-09 DIAGNOSIS — Z5181 Encounter for therapeutic drug level monitoring: Secondary | ICD-10-CM

## 2023-04-09 DIAGNOSIS — R7303 Prediabetes: Secondary | ICD-10-CM

## 2023-04-09 DIAGNOSIS — E782 Mixed hyperlipidemia: Secondary | ICD-10-CM | POA: Diagnosis not present

## 2023-04-09 DIAGNOSIS — E1129 Type 2 diabetes mellitus with other diabetic kidney complication: Secondary | ICD-10-CM | POA: Diagnosis not present

## 2023-04-09 DIAGNOSIS — N401 Enlarged prostate with lower urinary tract symptoms: Secondary | ICD-10-CM

## 2023-04-09 DIAGNOSIS — N138 Other obstructive and reflux uropathy: Secondary | ICD-10-CM

## 2023-04-09 DIAGNOSIS — E559 Vitamin D deficiency, unspecified: Secondary | ICD-10-CM

## 2023-04-09 DIAGNOSIS — D561 Beta thalassemia: Secondary | ICD-10-CM

## 2023-04-09 DIAGNOSIS — B351 Tinea unguium: Secondary | ICD-10-CM | POA: Insufficient documentation

## 2023-04-09 DIAGNOSIS — Z7984 Long term (current) use of oral hypoglycemic drugs: Secondary | ICD-10-CM

## 2023-04-09 DIAGNOSIS — R7989 Other specified abnormal findings of blood chemistry: Secondary | ICD-10-CM

## 2023-04-09 DIAGNOSIS — G4733 Obstructive sleep apnea (adult) (pediatric): Secondary | ICD-10-CM

## 2023-04-09 LAB — LIPID PANEL: Non-HDL Cholesterol (Calc): 61 mg/dL (calc) (ref <130)

## 2023-04-09 LAB — COMPLETE METABOLIC PANEL WITH GFR
AST: 20 U/L (ref 10–35)
Alkaline phosphatase (APISO): 56 U/L (ref 35–144)
Glucose, Bld: 99 mg/dL (ref 65–99)
Sodium: 140 mmol/L (ref 135–146)

## 2023-04-09 LAB — CBC WITH DIFFERENTIAL/PLATELET
Lymphs Abs: 1685 cells/uL (ref 850–3900)
Monocytes Relative: 12.3 %
WBC: 5.3 10*3/uL (ref 3.8–10.8)

## 2023-04-09 NOTE — Assessment & Plan Note (Signed)
On supplement Last labs a few years ago  Last vitamin D Lab Results  Component Value Date   VD25OH 22 (L) 12/29/2019

## 2023-04-09 NOTE — Assessment & Plan Note (Signed)
On clomid with urology

## 2023-04-09 NOTE — Assessment & Plan Note (Signed)
On metformin, DM foot exam done today on statin Lab Results  Component Value Date   HGBA1C 5.7 (A) 10/02/2022   HGBA1C 6.1 (H) 12/25/2021   HGBA1C 5.9 (H) 02/14/2021   HGBA1C 5.9 (H) 09/06/2020   HGBA1C 5.8 (H) 06/28/2020   HGBA1C 6.4 (H) 12/29/2019  Mildly positive UACR Recheck today if able to do urine sample

## 2023-04-09 NOTE — Assessment & Plan Note (Signed)
>>  ASSESSMENT AND PLAN FOR TYPE 2 DIABETES MELLITUS WITH OTHER DIABETIC KIDNEY COMPLICATION (HCC) WRITTEN ON 04/09/2023  9:35 AM BY TAPIA, LEISA, PA-C  On metformin, DM foot exam done today on statin Lab Results  Component Value Date   HGBA1C 5.7 (A) 10/02/2022   HGBA1C 6.1 (H) 12/25/2021   HGBA1C 5.9 (H) 02/14/2021   HGBA1C 5.9 (H) 09/06/2020   HGBA1C 5.8 (H) 06/28/2020   HGBA1C 6.4 (H) 12/29/2019  Mildly positive UACR Recheck today if able to do urine sample

## 2023-04-09 NOTE — Assessment & Plan Note (Signed)
BP well controlled today on norvasc 2.5 mg and coreg BP Readings from Last 3 Encounters:  04/09/23 118/70  03/05/23 129/70  10/02/22 138/80   Well controlled, compliant with meds, no SE or concerning sx Recheck renal function and electrolytes Pt encouraged to continue to work on healthy diet (low salt) and lifestyle for improving HTN management - DASH diet handout offered today

## 2023-04-09 NOTE — Progress Notes (Unsigned)
Name: Marc Stevens   MRN: [id number]    DOB: [birth year]   Date:04/09/2023       Progress Note  Chief Complaint  Patient presents with   Follow-up   Hypertension   Hyperlipidemia     Subjective:   Marc Stevens is a 62 y.o. male, presents to clinic for routine f/up on chronic   HTN norvasc 2.5 carvedilol BP Readings from Last 3 Encounters:  04/09/23 118/70  03/05/23 129/70  10/02/22 138/80   Hyperlipidemia: Currently treated with lipitor 10 mg , pt reports good med compliance Last Lipids: Lab Results  Component Value Date   CHOL 131 12/25/2021   HDL 50 12/25/2021   LDLCALC 66 12/25/2021   TRIG 69 12/25/2021   CHOLHDL 2.6 12/25/2021   - {ACTIONS;DENIES/REPORTS:21021675::"Denies"}: Chest pain, shortness of breath, myalgias, claudication  OSA on CPAP uses most of the time  Last vitamin D Lab Results  Component Value Date   VD25OH 22 (L) 12/29/2019        Current Outpatient Medications:    amLODipine (NORVASC) 2.5 MG tablet, TAKE 1 TABLET BY MOUTH EVERY DAY, Disp: 90 tablet, Rfl: 0   aspirin EC 81 MG tablet, Take 1 tablet (81 mg total) by mouth every morning. Resume 4 days post-op, Disp: , Rfl:    atorvastatin (LIPITOR) 10 MG tablet, TAKE 1 TABLET BY MOUTH EVERY DAY, Disp: 30 tablet, Rfl: 0   carvedilol (COREG) 12.5 MG tablet, Take 1 tablet (12.5 mg total) by mouth 2 (two) times daily with a meal., Disp: 180 tablet, Rfl: 1   clomiPHENE (CLOMID) 50 MG tablet, Take 1/2 tablet daily, Disp: 30 tablet, Rfl: 3   cromolyn (OPTICROM) 4 % ophthalmic solution, Place 1 drop into both eyes 4 (four) times daily as needed., Disp: 10 mL, Rfl: 2   diclofenac Sodium (VOLTAREN) 1 % GEL, , Disp: , Rfl:    gabapentin (NEURONTIN) 600 MG tablet, Take 1 tablet (600 mg total) by mouth 3 (three) times daily., Disp: 270 tablet, Rfl: 3   ketoconazole (NIZORAL) 2 % cream, Apply topically 2 (two) times daily., Disp: 30 g, Rfl: 1   losartan-hydrochlorothiazide (HYZAAR) 100-25  MG tablet, Take 1 tablet by mouth daily., Disp: 90 tablet, Rfl: 1   metFORMIN (GLUCOPHAGE-XR) 500 MG 24 hr tablet, TAKE 2 TABLETS BY MOUTH EVERY DAY WITH BREAKFAST, Disp: 180 tablet, Rfl: 1   sildenafil (VIAGRA) 100 MG tablet, TAKE 1 TABLET BY MOUTH DAILY AS NEEDED FOR ERECTILE DYSFUNCTION, Disp: 30 tablet, Rfl: 3   VITAMIN D, ERGOCALCIFEROL, PO, Take 5,000 Units by mouth daily., Disp: , Rfl:    erythromycin ophthalmic ointment, Place 1 Application into the left eye 2 (two) times daily. Place 1/2 inch ribbon of ointment in the affected eye 4 times a day (Patient not taking: Reported on 04/09/2023), Disp: 1 g, Rfl: 0   Na Sulfate-K Sulfate-Mg Sulf 17.5-3.13-1.6 GM/177ML SOLN, Take by mouth. 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   Pre-diabetes 08/13/2017   Mitral regurgitation 01/17/2016   LVH (left ventricular hypertrophy) 01/01/2016   Low testosterone 05/14/2015   BPH with  obstruction/lower urinary tract symptoms 05/14/2015   Vitamin D deficiency 04/26/2015   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   Class 2 severe obesity with serious comorbidity and body mass index (BMI) of 37.0 to 37.9 in adult [facility])    ED (erectile dysfunction)    Hyperlipidemia    OSA (obstructive sleep apnea)    HTN (hypertension) 04/18/2012    Past Surgical History:  Procedure Laterality Date   COLONOSCOPY WITH PROPOFOL N/A 02/18/2018   Procedure: COLONOSCOPY WITH PROPOFOL;  Surgeon: Wyline Mood, MD;  Location: [facility] ENDOSCOPY;  Service: Gastroenterology;  Laterality: N/A;   COLONOSCOPY WITH PROPOFOL N/A  03/05/2023   Procedure: COLONOSCOPY WITH PROPOFOL;  Surgeon: Wyline Mood, MD;  Location: [facility] ENDOSCOPY;  Service: Gastroenterology;  Laterality: N/A;   DOPPLER ECHOCARDIOGRAPHY  2013   left kene arthroscopy      LUMBAR LAMINECTOMY/DECOMPRESSION MICRODISCECTOMY N/A 01/30/2015   Procedure: MICRO LUMBAR DECOMPRESSION, BILATERAL FORAMINOTOMIES, MICRODISCECTOMY LUMBAR FOUR TO LUMBAR FIVE;  Surgeon: Jene Every, MD;  Location: [facility] ORS;  Service: Orthopedics;  Laterality: N/A;   right knee surgery     ROTATOR CUFF REPAIR  2007   Left   SHOULDER OPEN ROTATOR CUFF REPAIR Right 08/08/2015   Procedure: RIGHT SHOULDER MINI OPEN ROTATOR CUFF REPAIR SUBACROMIAL DECOMPRESSION ;  Surgeon: Jene Every, MD;  Location: [facility];  Service: Orthopedics;  Laterality: Right;    Family History  Problem Relation Age of Onset   Dementia Mother    Hypertension Mother    Cancer Father        colon   Heart disease Father    Congestive Heart Failure Father    Hypertension Sister    Dementia Maternal Grandmother    Kidney disease Neg Hx    Prostate cancer Neg Hx    COPD Neg Hx    Diabetes Neg Hx    Stroke Neg Hx    Kidney cancer Neg Hx    Bladder Cancer Neg Hx     Social History   Tobacco Use   Smoking status: Former    Packs/day: 0.50    Years: 30.00    Additional pack years: 0.00    Total pack years: 15.00    Types: Cigarettes    Quit date: 10/19/2004    Years since quitting: 18.4   Smokeless tobacco: Never  Vaping Use   Vaping Use: Never used  Substance Use Topics   Alcohol use: Yes    Alcohol/week: 4.0 standard drinks of alcohol    Types: 4 Shots of liquor per week    Comment: occasional   Drug use: No     No Known Allergies  Health Maintenance  Topic Date Due   Diabetic kidney evaluation - eGFR measurement  12/26/2022   HEMOGLOBIN A1C  04/03/2023   FOOT EXAM  04/04/2023   OPHTHALMOLOGY EXAM  04/09/2023 (Originally 02/11/1971)   COVID-19 Vaccine (3 - 2023-24 season) 04/25/2023  (Originally 06/19/2022)   Zoster Vaccines- Shingrix (1 of 2) 07/10/2023 (Originally 02/11/2012)   INFLUENZA VACCINE  05/20/2023   Diabetic kidney evaluation - Urine ACR  10/03/2023   DTaP/Tdap/Td (4 - Td or Tdap) 11/14/2027   Colonoscopy  03/04/2030   Hepatitis C Screening  Completed   HIV Screening  Completed   HPV VACCINES  Aged Out    Chart Review Today: ***  Review of Systems   Objective:   Vitals:   04/09/23 0914  BP: 118/70  Pulse: 79  Resp: 16  Temp: 97.9 F (36.6 C)  TempSrc: Oral  SpO2:  97%  Weight: 274 lb 14.4 oz (124.7 kg)  Height: 5\' 11"  (1.803 m)    Body mass index is 38.34 kg/m.  Physical Exam      Assessment & Plan:   Problem List Items Addressed This Visit       Cardiovascular and Mediastinum   HTN (hypertension) - Primary (Chronic)     Other   Hyperlipidemia (Chronic)   Pre-diabetes     No follow-ups on file.   Danelle Berry, PA-C 04/09/23 9:25 AM

## 2023-04-10 LAB — CBC WITH DIFFERENTIAL/PLATELET
Absolute Monocytes: 652 cells/uL (ref 200–950)
Basophils Absolute: 69 cells/uL (ref 0–200)
Basophils Relative: 1.3 %
Eosinophils Absolute: 249 cells/uL (ref 15–500)
Eosinophils Relative: 4.7 %
HCT: 48.1 % (ref 38.5–50.0)
Hemoglobin: 15 g/dL (ref 13.2–17.1)
MCH: 20.2 pg — ABNORMAL LOW (ref 27.0–33.0)
MCHC: 31.2 g/dL — ABNORMAL LOW (ref 32.0–36.0)
MCV: 64.8 fL — ABNORMAL LOW (ref 80.0–100.0)
MPV: 9.5 fL (ref 7.5–12.5)
Neutro Abs: 2645 cells/uL (ref 1500–7800)
Neutrophils Relative %: 49.9 %
Platelets: 315 10*3/uL (ref 140–400)
RBC: 7.42 10*6/uL — ABNORMAL HIGH (ref 4.20–5.80)
RDW: 18.5 % — ABNORMAL HIGH (ref 11.0–15.0)
Total Lymphocyte: 31.8 %

## 2023-04-10 LAB — COMPLETE METABOLIC PANEL WITH GFR
AG Ratio: 2 (calc) (ref 1.0–2.5)
ALT: 20 U/L (ref 9–46)
Albumin: 4.3 g/dL (ref 3.6–5.1)
BUN/Creatinine Ratio: 21 (calc) (ref 6–22)
BUN: 28 mg/dL — ABNORMAL HIGH (ref 7–25)
CO2: 31 mmol/L (ref 20–32)
Calcium: 9.5 mg/dL (ref 8.6–10.3)
Chloride: 101 mmol/L (ref 98–110)
Creat: 1.31 mg/dL (ref 0.70–1.35)
Globulin: 2.1 g/dL (calc) (ref 1.9–3.7)
Potassium: 3.9 mmol/L (ref 3.5–5.3)
Total Bilirubin: 0.7 mg/dL (ref 0.2–1.2)
Total Protein: 6.4 g/dL (ref 6.1–8.1)
eGFR: 62 mL/min/{1.73_m2} (ref >=60)

## 2023-04-10 LAB — LIPID PANEL
Cholesterol: 106 mg/dL (ref <200)
HDL: 45 mg/dL (ref >=40)
LDL Cholesterol (Calc): 45 mg/dL (calc)
Total CHOL/HDL Ratio: 2.4 (calc) (ref <5.0)
Triglycerides: 82 mg/dL (ref <150)

## 2023-04-10 LAB — MICROALBUMIN / CREATININE URINE RATIO
Creatinine, Urine: 125 mg/dL (ref 20–320)
Microalb Creat Ratio: 39 mg/g creat — ABNORMAL HIGH (ref <30)
Microalb, Ur: 4.9 mg/dL

## 2023-04-10 LAB — HEMOGLOBIN A1C
Hgb A1c MFr Bld: 6.1 % of total Hgb — ABNORMAL HIGH (ref <5.7)
Mean Plasma Glucose: 128 mg/dL
eAG (mmol/L): 7.1 mmol/L

## 2023-04-13 MED ORDER — TERBINAFINE HCL 250 MG PO TABS
250.0000 mg | ORAL_TABLET | Freq: Every day | ORAL | 0 refills | Status: DC
Start: 2023-04-13 — End: 2023-08-24

## 2023-04-13 NOTE — Assessment & Plan Note (Signed)
discussed oral med options and podiatry referral - terbinafine po x 12 weeks w/f/up and recommend podiatry if not improved   Meds rx after LFTs reviewed 12 week f/up appt

## 2023-04-13 NOTE — Assessment & Plan Note (Signed)
Per u rology 

## 2023-04-13 NOTE — Assessment & Plan Note (Signed)
Compliant with meds, no SE, no myalgias, fatigue or jaundice Due for FLP and recheck CMP Diet and exercise recommendations for HLD reviewed  

## 2023-04-26 ENCOUNTER — Other Ambulatory Visit: Payer: Self-pay | Admitting: *Deleted

## 2023-04-26 ENCOUNTER — Other Ambulatory Visit: Payer: Self-pay | Admitting: Family Medicine

## 2023-04-26 DIAGNOSIS — R7989 Other specified abnormal findings of blood chemistry: Secondary | ICD-10-CM

## 2023-04-26 DIAGNOSIS — E782 Mixed hyperlipidemia: Secondary | ICD-10-CM

## 2023-04-26 MED ORDER — CLOMIPHENE CITRATE 50 MG PO TABS
ORAL_TABLET | ORAL | 0 refills | Status: DC
Start: 2023-04-26 — End: 2023-04-28

## 2023-04-27 ENCOUNTER — Encounter: Payer: Self-pay | Admitting: Family Medicine

## 2023-04-28 ENCOUNTER — Other Ambulatory Visit: Payer: Self-pay | Admitting: Family Medicine

## 2023-04-28 ENCOUNTER — Other Ambulatory Visit: Payer: Self-pay

## 2023-04-28 DIAGNOSIS — R7989 Other specified abnormal findings of blood chemistry: Secondary | ICD-10-CM

## 2023-04-28 DIAGNOSIS — E782 Mixed hyperlipidemia: Secondary | ICD-10-CM

## 2023-04-28 MED ORDER — ATORVASTATIN CALCIUM 10 MG PO TABS
10.0000 mg | ORAL_TABLET | Freq: Every day | ORAL | 3 refills | Status: DC
Start: 2023-04-28 — End: 2024-04-18

## 2023-04-28 MED ORDER — CLOMIPHENE CITRATE 50 MG PO TABS
ORAL_TABLET | ORAL | 0 refills | Status: DC
Start: 2023-04-28 — End: 2023-11-12

## 2023-05-18 ENCOUNTER — Other Ambulatory Visit: Payer: Self-pay | Admitting: Internal Medicine

## 2023-05-18 DIAGNOSIS — R7303 Prediabetes: Secondary | ICD-10-CM

## 2023-05-18 NOTE — Telephone Encounter (Signed)
Requested medication (s) are due for refill today: need to clarify dose of medication  Requested medication (s) are on the active medication list: no   Last refill:  10/02/22 #180 1 refill  Future visit scheduled: yes in 1 month  Notes to clinic:  pharmacy requesting metformin XR 500 mg take 1 tablet po daily with breakfast. On med list metformin 500 mg XR take 2 tabs po every day with breakfast. Please advise dose of medication.     Requested Prescriptions  Pending Prescriptions Disp Refills   metFORMIN (GLUCOPHAGE-XR) 500 MG 24 hr tablet [Pharmacy Med Name: METFORMIN HCL ER 500 MG TABLET] 90 tablet 3    Sig: TAKE 1 TABLET BY MOUTH EVERY DAY WITH BREAKFAST     Endocrinology:  Diabetes - Biguanides Failed - 05/18/2023  2:22 AM      Failed - B12 Level in normal range and within 720 days    Vitamin B-12  Date Value Ref Range Status  01/12/2020 527 180 - 914 pg/mL Final    Comment:    (NOTE) This assay is not validated for testing neonatal or myeloproliferative syndrome specimens for Vitamin B12 levels. Performed at [facility], [address], [place], [place] [address]          Passed - Cr in normal range and within 360 days    Creat  Date Value Ref Range Status  04/09/2023 1.31 0.70 - 1.35 mg/dL Final   Creatinine, Urine  Date Value Ref Range Status  04/09/2023 125 20 - 320 mg/dL Final         Passed - HBA1C is between 0 and 7.9 and within 180 days    Hgb A1c MFr Bld  Date Value Ref Range Status  04/09/2023 6.1 (H) <5.7 % of total Hgb Final    Comment:    For someone without known diabetes, a hemoglobin  A1c value between 5.7% and 6.4% is consistent with prediabetes and should be confirmed with a  follow-up test. . For someone with known diabetes, a value <7% indicates that their diabetes is well controlled. A1c targets should be individualized based on duration of diabetes, age, comorbid conditions, and other considerations. . This assay result is  consistent with an increased risk of diabetes. . Currently, no consensus exists regarding use of hemoglobin A1c for diagnosis of diabetes for children. .          Passed - eGFR in normal range and within 360 days    GFR, Est African American  Date Value Ref Range Status  09/06/2020 80 > OR = 60 mL/min/1.38m2 Final   GFR, Est Non African American  Date Value Ref Range Status  09/06/2020 69 > OR = 60 mL/min/1.52m2 Final   GFR, Estimated  Date Value Ref Range Status  01/20/2021 >60 >60 mL/min Final    Comment:    (NOTE) Calculated using the CKD-EPI Creatinine Equation (2021)    eGFR  Date Value Ref Range Status  04/09/2023 62 > OR = 60 mL/min/1.76m2 Final         Passed - Valid encounter within last 6 months    Recent Outpatient Visits           1 month ago Primary hypertension   [facility] [place], Sheliah Mends, PA-C   4 months ago Conjunctivitis, unspecified conjunctivitis type, unspecified laterality   [facility] Danelle Berry, PA-C   7 months ago Primary hypertension   [facility] Margarita Mail, DO   1 year ago Pre-diabetes   [facility] Margarita Mail, DO   1 year ago Neck mass   [facility] Danelle Berry, [place]       Future Appointments             In 3 weeks [place], Elana [facility] [place]   In 1 month Danelle Berry, PA-C [facility]            Passed - CBC within normal limits and completed in the last 12 months    WBC  Date Value Ref Range Status  04/09/2023 5.3 3.8 - 10.8 Thousand/uL Final   RBC  Date Value Ref Range Status  04/09/2023 7.42 (H) 4.20 - 5.80 Million/uL Final   Hemoglobin  Date Value Ref Range Status  04/09/2023 15.0 13.2 - 17.1 g/dL Final   HGB  Date Value Ref Range Status  04/04/2014 13.9 13.0 - 18.0 g/dL Final   HCT  Date Value Ref  Range Status  04/09/2023 48.1 38.5 - 50.0 % Final   Hematocrit  Date Value Ref Range Status  05/14/2015 43.5 37.5 - 51.0 % Final   MCHC  Date Value Ref Range Status  04/09/2023 31.2 (L) 32.0 - 36.0 g/dL Final   [facility]  Date Value Ref Range Status  04/09/2023 20.2 (L) 27.0 - 33.0 pg Final   MCV  Date Value Ref Range Status  04/09/2023 64.8 (L) 80.0 - 100.0 fL Final  04/04/2014 65 (L) 80 - 100 fL Final   No results found for: "PLTCOUNTKUC", "LABPLAT", "POCPLA" RDW  Date Value Ref Range Status  04/09/2023 18.5 (H) 11.0 - 15.0 % Final  04/04/2014 15.9 (H) 11.5 - 14.5 % Final

## 2023-06-02 ENCOUNTER — Telehealth: Payer: Self-pay | Admitting: Podiatry

## 2023-06-02 NOTE — Telephone Encounter (Signed)
Pt would like a refill on medication Gabapentin

## 2023-06-08 ENCOUNTER — Other Ambulatory Visit: Payer: Self-pay | Admitting: *Deleted

## 2023-06-08 DIAGNOSIS — Z8639 Personal history of other endocrine, nutritional and metabolic disease: Secondary | ICD-10-CM

## 2023-06-08 DIAGNOSIS — N138 Other obstructive and reflux uropathy: Secondary | ICD-10-CM

## 2023-06-10 ENCOUNTER — Other Ambulatory Visit
Admission: RE | Admit: 2023-06-10 | Discharge: 2023-06-10 | Disposition: A | Discharge status: Home / Self Care | Payer: BC Managed Care – PPO | Attending: Urology | Admitting: Urology

## 2023-06-10 DIAGNOSIS — R7989 Other specified abnormal findings of blood chemistry: Secondary | ICD-10-CM | POA: Insufficient documentation

## 2023-06-10 DIAGNOSIS — N138 Other obstructive and reflux uropathy: Secondary | ICD-10-CM | POA: Insufficient documentation

## 2023-06-10 DIAGNOSIS — N401 Enlarged prostate with lower urinary tract symptoms: Secondary | ICD-10-CM | POA: Diagnosis present

## 2023-06-10 DIAGNOSIS — Z8639 Personal history of other endocrine, nutritional and metabolic disease: Secondary | ICD-10-CM | POA: Diagnosis present

## 2023-06-10 LAB — PSA: Prostatic Specific Antigen: 1.03 ng/mL (ref 0.00–4.00)

## 2023-06-11 ENCOUNTER — Other Ambulatory Visit: Payer: Self-pay | Admitting: Family Medicine

## 2023-06-11 ENCOUNTER — Other Ambulatory Visit: Payer: Self-pay | Admitting: Internal Medicine

## 2023-06-11 DIAGNOSIS — R7303 Prediabetes: Secondary | ICD-10-CM

## 2023-06-11 DIAGNOSIS — I10 Essential (primary) hypertension: Secondary | ICD-10-CM

## 2023-06-11 LAB — TESTOSTERONE: Testosterone: 492 ng/dL (ref 264–916)

## 2023-06-11 NOTE — Progress Notes (Unsigned)
9:06 AM   Marc Stevens [birth year] [id number]  Referring provider: Danelle Berry, PA-C [address] [place],  [place] [address]  Urological history: 1.  BPH with LU TS -PSA (05/2023) 1.03  2. ED -Contributing factors of age, HTN, sleep apnea, BPH, obesity, hyperlipidemia and age -sildenafil 100 mg, on-demand dosing  3. Prostate cancer screening -baseline PSA 1.8 (2016) at age 62 -PSA at age 24 - 1.39 -african ancestry  -?prostate cancer in father - died in his late 64's of CHF  No chief complaint on file.  [name] is a 62 y.o. male who presents today for yearly follow up.  Previous records reviewed.   I PSS ***    Score:  1-7 Mild 8-19 Moderate 20-35 Severe   SHIM ***   Score: 1-7 Severe ED 8-11 Moderate ED 12-16 Mild-Moderate ED 17-21 Mild ED 22-25 No ED   PMH: Past Medical History:  Diagnosis Date   BPH (benign prostatic hypertrophy)    Complete rotator cuff tear 08/08/2015   Diabetes mellitus without complication (HCC)    ED (erectile dysfunction)    HTN (hypertension) 04/18/2012   Hyperlipidemia    Low testosterone    Morbidly obese (HCC)    OSA (obstructive sleep apnea)    cpap - does not know settings    Rotator cuff tear    RT   Torn Achilles tendon 07/11/2015   Torn Achilles tendon 07/11/2015    Surgical History: Past Surgical History:  Procedure Laterality Date   COLONOSCOPY WITH PROPOFOL N/A 02/18/2018   Procedure: COLONOSCOPY WITH PROPOFOL;  Surgeon: Wyline Mood, MD;  Location: [facility] ENDOSCOPY;  Service: Gastroenterology;  Laterality: N/A;   COLONOSCOPY WITH PROPOFOL N/A 03/05/2023   Procedure: COLONOSCOPY WITH PROPOFOL;  Surgeon: Wyline Mood, MD;  Location: [facility] ENDOSCOPY;  Service: Gastroenterology;  Laterality: N/A;   DOPPLER ECHOCARDIOGRAPHY  2013   left kene arthroscopy      LUMBAR LAMINECTOMY/DECOMPRESSION MICRODISCECTOMY N/A 01/30/2015   Procedure: MICRO LUMBAR DECOMPRESSION, BILATERAL  FORAMINOTOMIES, MICRODISCECTOMY LUMBAR FOUR TO LUMBAR FIVE;  Surgeon: Jene Every, MD;  Location: [facility];  Service: Orthopedics;  Laterality: N/A;   right knee surgery     ROTATOR CUFF REPAIR  2007   Left   SHOULDER OPEN ROTATOR CUFF REPAIR Right 08/08/2015   Procedure: RIGHT SHOULDER MINI OPEN ROTATOR CUFF REPAIR SUBACROMIAL DECOMPRESSION ;  Surgeon: Jene Every, MD;  Location: [facility];  Service: Orthopedics;  Laterality: Right;    Home Medications:  Allergies as of 06/14/2023   No Known Allergies      Medication List        Accurate as of June 11, 2023  9:06 AM. If you have any questions, ask your nurse or doctor.          amLODipine 2.5 MG tablet Commonly known as: NORVASC TAKE 1 TABLET BY MOUTH EVERY DAY   aspirin EC 81 MG tablet Take 1 tablet (81 mg total) by mouth every morning. Resume 4 days post-op   atorvastatin 10 MG tablet Commonly known as: LIPITOR Take 1 tablet (10 mg total) by mouth daily.   carvedilol 12.5 MG tablet Commonly known as: COREG Take 1 tablet (12.5 mg total) by mouth 2 (two) times daily with a meal.   clomiPHENE 50 MG tablet Commonly known as: CLOMID Take 1/2 tablet daily   cromolyn 4 % ophthalmic solution Commonly known as: OPTICROM Place 1 drop into both eyes 4 (four) times daily as needed.   diclofenac  Sodium 1 % Gel Commonly known as: VOLTAREN   erythromycin ophthalmic ointment Place 1 Application into the left eye 2 (two) times daily. Place 1/2 inch ribbon of ointment in the affected eye 4 times a day   gabapentin 600 MG tablet Commonly known as: Neurontin Take 1 tablet (600 mg total) by mouth 3 (three) times daily.   ketoconazole 2 % cream Commonly known as: NIZORAL Apply topically 2 (two) times daily.   losartan-hydrochlorothiazide 100-25 MG tablet Commonly known as: HYZAAR Take 1 tablet by mouth daily.   metFORMIN 500 MG 24 hr tablet Commonly known as: GLUCOPHAGE-XR TAKE 1 TABLET BY MOUTH EVERY DAY WITH  BREAKFAST   Na Sulfate-K Sulfate-Mg Sulf 17.5-3.13-1.6 GM/177ML Soln Take by mouth.   sildenafil 100 MG tablet Commonly known as: VIAGRA TAKE 1 TABLET BY MOUTH DAILY AS NEEDED FOR ERECTILE DYSFUNCTION   terbinafine 250 MG tablet Commonly known as: LAMISIL Take 1 tablet (250 mg total) by mouth daily.   VITAMIN D (ERGOCALCIFEROL) PO Take 5,000 Units by mouth daily.        Allergies: No Known Allergies  Family History: Family History  Problem Relation Age of Onset   Dementia Mother    Hypertension Mother    Cancer Father        colon   Heart disease Father    Congestive Heart Failure Father    Hypertension Sister    Dementia Maternal Grandmother    Kidney disease Neg Hx    Prostate cancer Neg Hx    COPD Neg Hx    Diabetes Neg Hx    Stroke Neg Hx    Kidney cancer Neg Hx    Bladder Cancer Neg Hx     Social History:  reports that he quit smoking about 18 years ago. His smoking use included cigarettes. He started smoking about 48 years ago. He has a 15 pack-year smoking history. He has never used smokeless tobacco. He reports current alcohol use of about 4.0 standard drinks of alcohol per week. He reports that he does not use drugs.  ROS: For pertinent review of systems please refer to history of present illness  Physical Exam: Blood pressure (!) 147/83, pulse 65, height 5\' 10"  (1.778 m), weight 275 lb (124.7 kg). Constitutional:  Well nourished. Alert and oriented, No acute distress. Constitutional:  Well nourished. Alert and oriented, No acute distress. HEENT: [place] AT, moist mucus membranes.  Trachea midline Cardiovascular: No clubbing, cyanosis, or edema. Respiratory: Normal respiratory effort, no increased work of breathing. GU: No CVA tenderness.  No bladder fullness or masses.  Patient with circumcised/uncircumcised phallus. ***Foreskin easily retracted***  Urethral meatus is patent.  No penile discharge. No penile lesions or rashes. Scrotum without lesions, cysts,  rashes and/or edema.  Testicles are located scrotally bilaterally. No masses are appreciated in the testicles. Left and right epididymis are normal. Rectal: Patient with  normal sphincter tone. Anus and perineum without scarring or rashes. No rectal masses are appreciated. Prostate is approximately *** grams, *** nodules are appreciated. Seminal vesicles are normal. Neurologic: Grossly intact, no focal deficits, moving all 4 extremities. Psychiatric: Normal mood and affect.   Laboratory Data: Renal function panel Order: [id number] Component Ref Range & Units 2 wk ago  Glucose 65 - 99 mg/dL 409 High   Comment:  Fasting reference interval                                               For someone without known diabetes, a glucose value      between 100 and 125 mg/dL is consistent with      prediabetes and should be confirmed with a      follow-up test.         BUN 7 - 25 mg/dL 17  Creatinine 4.40 - 3.47 mg/dL 4.25  eGFR CKD-EPI CR 9563 > OR = 60 mL/min/1.34m2 72  BUN/Creatinine Ratio 6 - 22 (calc) SEE NOTE:  Comment:                                    Not Reported: BUN and Creatinine are within                                    reference range.                                       Sodium 135 - 146 mmol/L 143  Potassium 3.5 - 5.3 mmol/L 4.0  Chloride 98 - 110 mmol/L 105  Bicarbonate (CO2) 20 - 32 mmol/L 32  Calcium 8.6 - 10.3 mg/dL 9.2  Phosphorus 2.5 - 4.5 mg/dL 3.1  Albumin 3.6 - 5.1 g/dL 4.1  Resulting Agency See order comments   Specimen Collected: 05/27/23 09:30   Performed by: Chancy Hurter Last Resulted: 05/28/23 02:00  Received From: [facility]  Result Received: 06/02/23 10:06     Component     Latest Ref Rng 04/09/2023  Hemoglobin A1C     <5.7 % of total Hgb 6.1 (H)   Mean Plasma Glucose     mg/dL 875   eAG (mmol/L)     mmol/L 7.1     Legend: (H)  High  Results for orders placed or performed during the hospital encounter of 06/10/23  PSA  Result Value Ref Range   Prostatic Specific Antigen 1.03 0.00 - 4.00 ng/mL   *** I have reviewed the labs  Pertinent Imaging N/A  Assessment & Plan:    1. Erectile dysfunction -not at goal with erections -encouraged good diabetic control -encouraged to continue CPAP use -check testosterone level today- per AUA guidelines *** -sildenafil 100 mg, on-demand-dosing-refills  2 . BPH with LUTS -PSA stable  -DRE benign *** -symptoms - urgency -continue conservative management, avoiding bladder irritants and timed voiding's  3. Prostate cancer screening -discussed AUA guidelines for early detection of prostate cancer -through shared decision making decided to continue yearly screenings at this time  No follow-ups on file.  Cloretta Ned  [facility] [address] [place], [place] [address] [phone]

## 2023-06-14 ENCOUNTER — Ambulatory Visit (INDEPENDENT_AMBULATORY_CARE_PROVIDER_SITE_OTHER): Payer: BC Managed Care – PPO | Admitting: Urology

## 2023-06-14 ENCOUNTER — Encounter: Payer: Self-pay | Admitting: Urology

## 2023-06-14 VITALS — BP 155/88 | HR 80 | Ht 71.0 in | Wt 277.2 lb

## 2023-06-14 DIAGNOSIS — N138 Other obstructive and reflux uropathy: Secondary | ICD-10-CM

## 2023-06-14 DIAGNOSIS — N529 Male erectile dysfunction, unspecified: Secondary | ICD-10-CM | POA: Diagnosis not present

## 2023-06-14 DIAGNOSIS — N401 Enlarged prostate with lower urinary tract symptoms: Secondary | ICD-10-CM

## 2023-06-14 MED ORDER — SILDENAFIL CITRATE 100 MG PO TABS: ORAL_TABLET | ORAL | 3 refills | Status: DC

## 2023-06-14 NOTE — Telephone Encounter (Signed)
Requested Prescriptions  Pending Prescriptions Disp Refills   metFORMIN (GLUCOPHAGE-XR) 500 MG 24 hr tablet [Pharmacy Med Name: METFORMIN HCL ER 500 MG TABLET] 180 tablet 1    Sig: TAKE 2 TABLETS BY MOUTH EVERY DAY WITH BREAKFAST     Endocrinology:  Diabetes - Biguanides Failed - 06/11/2023  3:22 PM      Failed - B12 Level in normal range and within 720 days    Vitamin B-12  Date Value Ref Range Status  01/12/2020 527 180 - 914 pg/mL Final    Comment:    (NOTE) This assay is not validated for testing neonatal or myeloproliferative syndrome specimens for Vitamin B12 levels. Performed at [facility], [address] [place], [place] [address]          Passed - Cr in normal range and within 360 days    Creat  Date Value Ref Range Status  04/09/2023 1.31 0.70 - 1.35 mg/dL Final   Creatinine, Urine  Date Value Ref Range Status  04/09/2023 125 20 - 320 mg/dL Final         Passed - HBA1C is between 0 and 7.9 and within 180 days    Hgb A1c MFr Bld  Date Value Ref Range Status  04/09/2023 6.1 (H) <5.7 % of total Hgb Final    Comment:    For someone without known diabetes, a hemoglobin  A1c value between 5.7% and 6.4% is consistent with prediabetes and should be confirmed with a  follow-up test. . For someone with known diabetes, a value <7% indicates that their diabetes is well controlled. A1c targets should be individualized based on duration of diabetes, age, comorbid conditions, and other considerations. . This assay result is consistent with an increased risk of diabetes. . Currently, no consensus exists regarding use of hemoglobin A1c for diagnosis of diabetes for children. .          Passed - eGFR in normal range and within 360 days    GFR, Est African American  Date Value Ref Range Status  09/06/2020 80 > OR = 60 mL/min/1.50m2 Final   GFR, Est Non African American  Date Value Ref Range Status  09/06/2020 69 > OR = 60 mL/min/1.22m2 Final   GFR,  Estimated  Date Value Ref Range Status  01/20/2021 >60 >60 mL/min Final    Comment:    (NOTE) Calculated using the CKD-EPI Creatinine Equation (2021)    eGFR  Date Value Ref Range Status  04/09/2023 62 > OR = 60 mL/min/1.34m2 Final         Passed - Valid encounter within last 6 months    Recent Outpatient Visits           2 months ago Primary hypertension   [facility] Danelle Berry, PA-C   5 months ago Conjunctivitis, unspecified conjunctivitis type, unspecified laterality   [facility] Danelle Berry, PA-C   8 months ago Primary hypertension   [facility] Margarita Mail, DO   1 year ago Pre-diabetes   [facility] Margarita Mail, DO   1 year ago Neck mass   [facility] Danelle Berry, [place]       Future Appointments             In 2 weeks Danelle Berry, PA-C [facility]   In 3 weeks Vanna Scotland, MD [facility] [place]  Passed - CBC within normal limits and completed in the last 12 months    WBC  Date Value Ref Range Status  04/09/2023 5.3 3.8 - 10.8 Thousand/uL Final   RBC  Date Value Ref Range Status  04/09/2023 7.42 (H) 4.20 - 5.80 Million/uL Final   Hemoglobin  Date Value Ref Range Status  04/09/2023 15.0 13.2 - 17.1 g/dL Final   HGB  Date Value Ref Range Status  04/04/2014 13.9 13.0 - 18.0 g/dL Final   HCT  Date Value Ref Range Status  04/09/2023 48.1 38.5 - 50.0 % Final   Hematocrit  Date Value Ref Range Status  05/14/2015 43.5 37.5 - 51.0 % Final   MCHC  Date Value Ref Range Status  04/09/2023 31.2 (L) 32.0 - 36.0 g/dL Final   [facility]  Date Value Ref Range Status  04/09/2023 20.2 (L) 27.0 - 33.0 pg Final   MCV  Date Value Ref Range Status  04/09/2023 64.8 (L) 80.0 - 100.0 fL Final  04/04/2014 65 (L) 80 - 100 fL Final   No results found for:  "PLTCOUNTKUC", "LABPLAT", "POCPLA" RDW  Date Value Ref Range Status  04/09/2023 18.5 (H) 11.0 - 15.0 % Final  04/04/2014 15.9 (H) 11.5 - 14.5 % Final          carvedilol (COREG) 12.5 MG tablet [Pharmacy Med Name: CARVEDILOL 12.5 MG TABLET] 180 tablet 1    Sig: TAKE 1 TABLET (12.5MG  TOTAL) BY MOUTH TWICE A DAY WITH MEALS     Cardiovascular: Beta Blockers 3 Passed - 06/11/2023  3:22 PM      Passed - Cr in normal range and within 360 days    Creat  Date Value Ref Range Status  04/09/2023 1.31 0.70 - 1.35 mg/dL Final   Creatinine, Urine  Date Value Ref Range Status  04/09/2023 125 20 - 320 mg/dL Final         Passed - AST in normal range and within 360 days    AST  Date Value Ref Range Status  04/09/2023 20 10 - 35 U/L Final         Passed - ALT in normal range and within 360 days    ALT  Date Value Ref Range Status  04/09/2023 20 9 - 46 U/L Final         Passed - Last BP in normal range    BP Readings from Last 1 Encounters:  06/14/23 (!) 155/88         Passed - Last Heart Rate in normal range    Pulse Readings from Last 1 Encounters:  06/14/23 80         Passed - Valid encounter within last 6 months    Recent Outpatient Visits           2 months ago Primary hypertension   [facility], [facility]-C   5 months ago Conjunctivitis, unspecified conjunctivitis type, unspecified laterality   [facility], [facility]-C   8 months ago Primary hypertension   [facility] Margarita Mail, DO   1 year ago Pre-diabetes   [facility] Margarita Mail, DO   1 year ago Neck mass   [facility] Danelle Berry, [place]       Future Appointments             In 2 weeks Danelle Berry, PA-C [facility]  In 3 weeks Vanna Scotland, MD [facility] [place]              losartan-hydrochlorothiazide [facility]) 100-25 MG tablet [Pharmacy Med Name: LOSARTAN-HCTZ 100-25 MG TAB] 90 tablet 1    Sig: TAKE 1 TABLET BY MOUTH EVERY DAY     Cardiovascular: ARB + Diuretic Combos Passed - 06/11/2023  3:22 PM      Passed - K in normal range and within 180 days    Potassium  Date Value Ref Range Status  04/09/2023 3.9 3.5 - 5.3 mmol/L Final  04/04/2014 3.7 3.5 - 5.1 mmol/L Final         Passed - Na in normal range and within 180 days    Sodium  Date Value Ref Range Status  04/09/2023 140 135 - 146 mmol/L Final  02/21/2016 141 134 - 144 mmol/L Final  04/04/2014 144 136 - 145 mmol/L Final         Passed - Cr in normal range and within 180 days    Creat  Date Value Ref Range Status  04/09/2023 1.31 0.70 - 1.35 mg/dL Final   Creatinine, Urine  Date Value Ref Range Status  04/09/2023 125 20 - 320 mg/dL Final         Passed - eGFR is 10 or above and within 180 days    GFR, Est African American  Date Value Ref Range Status  09/06/2020 80 > OR = 60 mL/min/1.66m2 Final   GFR, Est Non African American  Date Value Ref Range Status  09/06/2020 69 > OR = 60 mL/min/1.59m2 Final   GFR, Estimated  Date Value Ref Range Status  01/20/2021 >60 >60 mL/min Final    Comment:    (NOTE) Calculated using the CKD-EPI Creatinine Equation (2021)    eGFR  Date Value Ref Range Status  04/09/2023 62 > OR = 60 mL/min/1.19m2 Final         Passed - Patient is not pregnant      Passed - Last BP in normal range    BP Readings from Last 1 Encounters:  06/14/23 (!) 155/88         Passed - Valid encounter within last 6 months    Recent Outpatient Visits           2 months ago Primary hypertension   [facility], Sheliah Mends, PA-C   5 months ago Conjunctivitis, unspecified conjunctivitis type, unspecified laterality   [facility] Danelle Berry, PA-C   8 months ago Primary hypertension   [facility] Margarita Mail, DO   1 year ago Pre-diabetes   [facility] Margarita Mail, DO   1 year ago Neck mass   [facility] Danelle Berry, PA-C       Future Appointments             In 2 weeks Danelle Berry, PA-C [facility]   In 3 weeks Vanna Scotland, MD [facility] [place]             amLODipine (NORVASC) 2.5 MG tablet [Pharmacy Med Name: AMLODIPINE BESYLATE 2.5 MG TAB] 90 tablet 0    Sig: TAKE 1 TABLET BY MOUTH EVERY DAY     Cardiovascular: Calcium Channel Blockers 2 Passed - 06/11/2023  3:22 PM      Passed - Last BP in normal range    BP Readings from Last 1  Encounters:  06/14/23 (!) 155/88         Passed - Last Heart Rate in normal range    Pulse Readings from Last 1 Encounters:  06/14/23 80         Passed - Valid encounter within last 6 months    Recent Outpatient Visits           2 months ago Primary hypertension   [facility] Dan[facility] Berry, [facility]-C   5 months ago Conjunctivitis, unspecified conjunctivitis type, unspecified laterality   [facility] Berry, PA-C   8 months ago Primary hypertension   [facility] Margarita Mail, DO   1 year ago Pre-diabetes   [facility] Margarita Mail, DO   1 year ago Neck mass   [facility] Danelle Berry, [place]       Future Appointments             In 2 weeks Danelle Berry, PA-C [facility]   In 3 weeks Vanna Scotland, MD [facility] [place]

## 2023-06-14 NOTE — Telephone Encounter (Signed)
Refilled7/31/24 # 90. Requested Prescriptions  Signed Prescriptions Disp Refills   carvedilol (COREG) 12.5 MG tablet 180 tablet 1    Sig: TAKE 1 TABLET (12.5MG  TOTAL) BY MOUTH TWICE A DAY WITH MEALS     Cardiovascular: Beta Blockers 3 Passed - 06/11/2023  3:22 PM      Passed - Cr in normal range and within 360 days    Creat  Date Value Ref Range Status  04/09/2023 1.31 0.70 - 1.35 mg/dL Final   Creatinine, Urine  Date Value Ref Range Status  04/09/2023 125 20 - 320 mg/dL Final         Passed - AST in normal range and within 360 days    AST  Date Value Ref Range Status  04/09/2023 20 10 - 35 U/L Final         Passed - ALT in normal range and within 360 days    ALT  Date Value Ref Range Status  04/09/2023 20 9 - 46 U/L Final         Passed - Last BP in normal range    BP Readings from Last 1 Encounters:  06/14/23 (!) 155/88         Passed - Last Heart Rate in normal range    Pulse Readings from Last 1 Encounters:  06/14/23 80         Passed - Valid encounter within last 6 months    Recent Outpatient Visits           2 months ago Primary hypertension   [facility] Berry, [facility]-C   5 months ago Conjunctivitis, unspecified conjunctivitis type, unspecified laterality   [facility] Danelle Berry, PA-C   8 months ago Primary hypertension   [facility] Margarita Mail, DO   1 year ago Pre-diabetes   [facility]arita Mail, DO   1 year ago Neck mass   [facility] Dan[facility] Berry, [facility]-C       Future Appointments             In 2 weeks Danelle Berry, PA-C [facility]   In 3 weeks Vanna Scotland, MD [facility] [place]             losartan-hydrochlorothiazide (HYZAAR) 100-25 MG tablet 90 tablet 1    Sig: TAKE 1 TABLET BY MOUTH EVERY DAY     Cardiovascular: ARB + Diuretic Combos  Passed - 06/11/2023  3:22 PM      Passed - K in normal range and within 180 days    Potassium  Date Value Ref Range Status  04/09/2023 3.9 3.5 - 5.3 mmol/L Final  04/04/2014 3.7 3.5 - 5.1 mmol/L Final         Passed - Na in normal range and within 180 days    Sodium  Date Value Ref Range Status  04/09/2023 140 135 - 146 mmol/L Final  02/21/2016 141 134 - 144 mmol/L Final  04/04/2014 144 136 - 145 mmol/L Final         Passed - Cr in normal range and within 180 days    Creat  Date Value Ref Range Status  04/09/2023 1.31 0.70 - 1.35 mg/dL Final   Creatinine, Urine  Date Value Ref Range Status  04/09/2023 125 20 - 320 mg/dL Final         Passed - eGFR is 10 or above  and within 180 days    GFR, Est African American  Date Value Ref Range Status  09/06/2020 80 > OR = 60 mL/min/1.79m2 Final   GFR, Est Non African American  Date Value Ref Range Status  09/06/2020 69 > OR = 60 mL/min/1.81m2 Final   GFR, Estimated  Date Value Ref Range Status  01/20/2021 >60 >60 mL/min Final    Comment:    (NOTE) Calculated using the CKD-EPI Creatinine Equation (2021)    eGFR  Date Value Ref Range Status  04/09/2023 62 > OR = 60 mL/min/1.37m2 Final         Passed - Patient is not pregnant      Passed - Last BP in normal range    BP Readings from Last 1 Encounters:  06/14/23 (!) 155/88         Passed - Valid encounter within last 6 months    Recent Outpatient Visits           2 months ago Primary hypertension   [facility] [place], Sheliah Mends, PA-C   5 months ago Conjunctivitis, unspecified conjunctivitis type, unspecified laterality   [facility] Danelle Berry, PA-C   8 months ago Primary hypertension   [facility] Margarita Mail, DO   1 year ago Pre-diabetes   [facility] Margarita Mail, DO   1 year ago Neck mass   [facility] Danelle Berry,  [place]       Future Appointments             In 2 weeks Danelle Berry, PA-C [facility]   In 3 weeks Vanna Scotland, MD [facility] [place]             amLODipine (NORVASC) 2.5 MG tablet 90 tablet 0    Sig: TAKE 1 TABLET BY MOUTH EVERY DAY     Cardiovascular: Calcium Channel Blockers 2 Passed - 06/11/2023  3:22 PM      Passed - Last BP in normal range    BP Readings from Last 1 Encounters:  06/14/23 (!) 155/88         Passed - Last Heart Rate in normal range    Pulse Readings from Last 1 Encounters:  06/14/23 80         Passed - Valid encounter within last 6 months    Recent Outpatient Visits           2 months ago Primary hypertension   [facility] [place], Sheliah Mends, PA-C   5 months ago Conjunctivitis, unspecified conjunctivitis type, unspecified laterality   [facility] Danelle Berry, PA-C   8 months ago Primary hypertension   [facility] Margarita Mail, DO   1 year ago Pre-diabetes   [facility] Margarita Mail, DO   1 year ago Neck mass   [facility] Danelle Berry, [place]       Future Appointments             In 2 weeks Danelle Berry, PA-C [facility]   In 3 weeks Vanna Scotland, MD [facility] [place]            Refused Prescriptions Disp Refills   metFORMIN (GLUCOPHAGE-XR) 500 MG 24 hr tablet [Pharmacy Med Name: METFORMIN HCL ER 500 MG TABLET] 180 tablet 1  Sig: TAKE 2 TABLETS BY MOUTH EVERY DAY WITH BREAKFAST     Endocrinology:  Diabetes - Biguanides Failed - 06/11/2023  3:22 PM      Failed - B12 Level in normal range and within 720 days    Vitamin B-12  Date Value Ref Range Status  01/12/2020 527 180 - 914 pg/mL Final    Comment:    (NOTE) This assay is not validated for testing neonatal or myeloproliferative syndrome specimens for  Vitamin B12 levels. Performed at [facility], [address], [place], [place] [address]          Passed - Cr in normal range and within 360 days    Creat  Date Value Ref Range Status  04/09/2023 1.31 0.70 - 1.35 mg/dL Final   Creatinine, Urine  Date Value Ref Range Status  04/09/2023 125 20 - 320 mg/dL Final         Passed - HBA1C is between 0 and 7.9 and within 180 days    Hgb A1c MFr Bld  Date Value Ref Range Status  04/09/2023 6.1 (H) <5.7 % of total Hgb Final    Comment:    For someone without known diabetes, a hemoglobin  A1c value between 5.7% and 6.4% is consistent with prediabetes and should be confirmed with a  follow-up test. . For someone with known diabetes, a value <7% indicates that their diabetes is well controlled. A1c targets should be individualized based on duration of diabetes, age, comorbid conditions, and other considerations. . This assay result is consistent with an increased risk of diabetes. . Currently, no consensus exists regarding use of hemoglobin A1c for diagnosis of diabetes for children. .          Passed - eGFR in normal range and within 360 days    GFR, Est African American  Date Value Ref Range Status  09/06/2020 80 > OR = 60 mL/min/1.66m2 Final   GFR, Est Non African American  Date Value Ref Range Status  09/06/2020 69 > OR = 60 mL/min/1.1m2 Final   GFR, Estimated  Date Value Ref Range Status  01/20/2021 >60 >60 mL/min Final    Comment:    (NOTE) Calculated using the CKD-EPI Creatinine Equation (2021)    eGFR  Date Value Ref Range Status  04/09/2023 62 > OR = 60 mL/min/1.105m2 Final         Passed - Valid encounter within last 6 months    Recent Outpatient Visits           2 months ago Primary hypertension   [facility] Dan[facility] Berry, [facility]-C   5 months ago Conjunctivitis, unspecified conjunctivitis type, unspecified laterality   [facility] Danelle Berry, PA-C   8 months ago Primary hypertension   [facility] Margarita Mail, DO   1 year ago Pre-diabetes   [facility] Margarita Mail, DO   1 year ago Neck mass   [facility] Danelle Berry, [place]       Future Appointments             In 2 weeks Danelle Berry, PA-C [facility]   In 3 weeks Vanna Scotland, MD [facility] [place]            Passed - CBC within normal limits and completed in the last 12 months    WBC  Date Value Ref Range  Status  04/09/2023 5.3 3.8 - 10.8 Thousand/uL Final   RBC  Date Value Ref Range Status  04/09/2023 7.42 (H) 4.20 - 5.80 Million/uL Final   Hemoglobin  Date Value Ref Range Status  04/09/2023 15.0 13.2 - 17.1 g/dL Final   HGB  Date Value Ref Range Status  04/04/2014 13.9 13.0 - 18.0 g/dL Final   HCT  Date Value Ref Range Status  04/09/2023 48.1 38.5 - 50.0 % Final   Hematocrit  Date Value Ref Range Status  05/14/2015 43.5 37.5 - 51.0 % Final   MCHC  Date Value Ref Range Status  04/09/2023 31.2 (L) 32.0 - 36.0 g/dL Final   [facility]  Date Value Ref Range Status  04/09/2023 20.2 (L) 27.0 - 33.0 pg Final   MCV  Date Value Ref Range Status  04/09/2023 64.8 (L) 80.0 - 100.0 fL Final  04/04/2014 65 (L) 80 - 100 fL Final   No results found for: "PLTCOUNTKUC", "LABPLAT", "POCPLA" RDW  Date Value Ref Range Status  04/09/2023 18.5 (H) 11.0 - 15.0 % Final  04/04/2014 15.9 (H) 11.5 - 14.5 % Final

## 2023-06-15 ENCOUNTER — Encounter: Payer: Self-pay | Admitting: Family Medicine

## 2023-06-25 ENCOUNTER — Encounter: Payer: Self-pay | Admitting: Pharmacist

## 2023-07-02 ENCOUNTER — Ambulatory Visit: Payer: BC Managed Care – PPO | Admitting: Family Medicine

## 2023-07-09 ENCOUNTER — Ambulatory Visit: Payer: BC Managed Care – PPO | Admitting: Urology

## 2023-07-23 ENCOUNTER — Ambulatory Visit: Payer: BC Managed Care – PPO | Admitting: Urology

## 2023-07-30 ENCOUNTER — Encounter: Payer: Self-pay | Admitting: Family Medicine

## 2023-08-13 ENCOUNTER — Ambulatory Visit (INDEPENDENT_AMBULATORY_CARE_PROVIDER_SITE_OTHER): Payer: BC Managed Care – PPO | Admitting: Urology

## 2023-08-13 VITALS — BP 161/89 | HR 71 | Ht 71.0 in | Wt 283.4 lb

## 2023-08-13 DIAGNOSIS — N471 Phimosis: Secondary | ICD-10-CM | POA: Diagnosis not present

## 2023-08-13 NOTE — Progress Notes (Signed)
Marcelle Overlie Plume,acting as a scribe for Vanna Scotland, MD.,have documented all relevant documentation on the behalf of Vanna Scotland, MD,as directed by  Vanna Scotland, MD while in the presence of Vanna Scotland, MD.  08/13/2023 10:45 AM   Marc Stevens 1961-03-14 [id number]  Referring provider: Danelle Berry, PA-C [address] [place],  [place] [address]  Chief Complaint  Patient presents with   Circumcision    Discussion     HPI:  62 year-old male who presents today to discuss circumcision.   He reports experiencing small tears and soreness of his foreskin when attempting to use a condom, which sometimes leads to avoiding sexual activity. He has not tried any creams or salves for the foreskin issue. He is considering circumcision due to these ongoing issues.    PMH: Past Medical History:  Diagnosis Date   BPH (benign prostatic hypertrophy)    Complete rotator cuff tear 08/08/2015   Diabetes mellitus without complication (HCC)    ED (erectile dysfunction)    HTN (hypertension) 04/18/2012   Hyperlipidemia    Low testosterone    Morbidly obese (HCC)    OSA (obstructive sleep apnea)    cpap - does not know settings    Rotator cuff tear    RT   Torn Achilles tendon 07/11/2015   Torn Achilles tendon 07/11/2015    Surgical History: Past Surgical History:  Procedure Laterality Date   COLONOSCOPY WITH PROPOFOL N/A 02/18/2018   Procedure: COLONOSCOPY WITH PROPOFOL;  Surgeon: Wyline Mood, MD;  Location: [facility] ENDOSCOPY;  Service: Gastroenterology;  Laterality: N/A;   COLONOSCOPY WITH PROPOFOL N/A 03/05/2023   Procedure: COLONOSCOPY WITH PROPOFOL;  Surgeon: Wyline Mood, MD;  Location: [facility] ENDOSCOPY;  Service: Gastroenterology;  Laterality: N/A;   DOPPLER ECHOCARDIOGRAPHY  2013   left kene arthroscopy      LUMBAR LAMINECTOMY/DECOMPRESSION MICRODISCECTOMY N/A 01/30/2015   Procedure: MICRO LUMBAR DECOMPRESSION, BILATERAL FORAMINOTOMIES, MICRODISCECTOMY LUMBAR  FOUR TO LUMBAR FIVE;  Surgeon: Jene Every, MD;  Location: [facility] ORS;  Service: Orthopedics;  Laterality: N/A;   right knee surgery     ROTATOR CUFF REPAIR  2007   Left   SHOULDER OPEN ROTATOR CUFF REPAIR Right 08/08/2015   Procedure: RIGHT SHOULDER MINI OPEN ROTATOR CUFF REPAIR SUBACROMIAL DECOMPRESSION ;  Surgeon: Jene Every, MD;  Location: [facility] ORS;  Service: Orthopedics;  Laterality: Right;    Home Medications:  Allergies as of 08/13/2023   No Known Allergies      Medication List        Accurate as of August 13, 2023 10:45 AM. If you have any questions, ask your nurse or doctor.          amLODipine 2.5 MG tablet Commonly known as: NORVASC TAKE 1 TABLET BY MOUTH EVERY DAY   aspirin EC 81 MG tablet Take 1 tablet (81 mg total) by mouth every morning. Resume 4 days post-op   atorvastatin 10 MG tablet Commonly known as: LIPITOR Take 1 tablet (10 mg total) by mouth daily.   carvedilol 12.5 MG tablet Commonly known as: COREG TAKE 1 TABLET (12.5MG  TOTAL) BY MOUTH TWICE A DAY WITH MEALS   clomiPHENE 50 MG tablet Commonly known as: CLOMID Take 1/2 tablet daily   diclofenac Sodium 1 % Gel Commonly known as: VOLTAREN   gabapentin 600 MG tablet Commonly known as: Neurontin Take 1 tablet (600 mg total) by mouth 3 (three) times daily.   ketoconazole 2 % cream Commonly known as: NIZORAL Apply topically 2 (two)  times daily.   losartan-hydrochlorothiazide 100-25 MG tablet Commonly known as: HYZAAR TAKE 1 TABLET BY MOUTH EVERY DAY   metFORMIN 500 MG 24 hr tablet Commonly known as: GLUCOPHAGE-XR TAKE 1 TABLET BY MOUTH EVERY DAY WITH BREAKFAST   Na Sulfate-K Sulfate-Mg Sulf 17.5-3.13-1.6 GM/177ML Soln Take by mouth.   sildenafil 100 MG tablet Commonly known as: VIAGRA TAKE 1 TABLET BY MOUTH DAILY   terbinafine 250 MG tablet Commonly known as: LAMISIL Take 1 tablet (250 mg total) by mouth daily.   VITAMIN D (ERGOCALCIFEROL) PO Take 5,000 Units by mouth  daily.         Family History: Family History  Problem Relation Age of Onset   Dementia Mother    Hypertension Mother    Cancer Father        colon   Heart disease Father    Congestive Heart Failure Father    Hypertension Sister    Dementia Maternal Grandmother    Kidney disease Neg Hx    Prostate cancer Neg Hx    COPD Neg Hx    Diabetes Neg Hx    Stroke Neg Hx    Kidney cancer Neg Hx    Bladder Cancer Neg Hx     Social History:  reports that he quit smoking about 18 years ago. His smoking use included cigarettes. He started smoking about 48 years ago. He has a 15 pack-year smoking history. He has never used smokeless tobacco. He reports current alcohol use of about 4.0 standard drinks of alcohol per week. He reports that he does not use drugs.   Physical Exam: BP (!) 161/89   Pulse 71   Ht 5\' 11"  (1.803 m)   Wt 283 lb 6 oz (128.5 kg)   BMI 39.52 kg/m   Constitutional:  Alert and oriented, No acute distress. HEENT: [place] AT, moist mucus membranes.  Trachea midline, no masses. GU: Uncircumcised phallus, no erythema, small tears noted. No difficulty retracting foreskin Neurologic: Grossly intact, no focal deficits, moving all 4 extremities. Psychiatric: Normal mood and affect.   Assessment & Plan:    1. Phimosis/ [place] - Discussed the use of topical steroid and antifungal creams to manage inflammation and potential eczema-like symptoms of the foreskin.  - He has expressed interest in proceeding with circumcision - He has been informed about the procedure, including the need for sedation, potential risks (bleeding, infection, sensitivity, dissatisfaction with cosmesis), and post-operative care (abstinence from sexual activity for one month, use of bacitracin ointment to prevent stitches from sticking to clothing).  - He will be scheduled for an outpatient circumcision procedure.  Return for circumcision. Follow up 1 month post-procedure to assess healing and address  any concerns.  I have reviewed the above documentation for accuracy and completeness, and I agree with the above.   Vanna Scotland, MD   [facility] [address], [address] [place], [place] [address] [phone]

## 2023-08-13 NOTE — Progress Notes (Unsigned)
Marc Stevens presents for an office visit. BP today is __161/89_________. He is complaint with BP medication. Greater than 140/90. Provider  notified. Pt advised to_f/u with PCP_______. Pt voiced understanding.

## 2023-08-13 NOTE — Patient Instructions (Signed)
Circumcision, Adult  Circumcision is a surgery to remove or cut the fold of skin that covers the head of the penis (foreskin). When the foreskin is cut but not removed, the procedure is called a dorsal incision or dorsal circumcision. A dorsal circumcision leaves the entire foreskin but makes the end of the foreskin looser so it can be pulled back over the head of the penis. Tell a health care provider about: Any allergies you have. All medicines you are taking, including vitamins, herbs, eye drops, creams, and over-the-counter medicines. Any problems you or family members have had with anesthetic medicines. Any bleeding problems you have. Any surgeries you have had. Any medical conditions you have, including the common cold or another infection. What are the risks? Generally, this is a safe procedure. However, problems may occur, including: Bleeding. Pain. Infection. Allergic reactions to medicines. Opening of the surgical wound. This can occur from an unwanted erection after surgery. What happens before the procedure? When to stop eating and drinking Follow instructions from your health care provider about what you may eat and drink before your procedure. These may include: 8 hours before your procedure Stop eating most foods. Do not eat meat, fried foods, or fatty foods. Eat only light foods, such as toast or crackers. All liquids are okay except energy drinks and alcohol. 6 hours before your procedure Stop eating. Drink only clear liquids, such as water, clear fruit juice, black coffee, plain tea, and sports drinks. Do not drink energy drinks or alcohol. 2 hours before your procedure Stop drinking all liquids. You may be allowed to take medicines with small sips of water. If you do not follow your health care provider's instructions, your procedure may be delayed or canceled. Medicines Ask your health care provider about: Changing or stopping your regular medicines. This is  especially important if you are taking diabetes medicines or blood thinners. Taking medicines such as aspirin and ibuprofen. These medicines can thin your blood. Do not take these medicines unless your health care provider tells you to take them. Taking over-the-counter medicines, vitamins, herbs, and supplements. General instructions  Do not use any products that contain nicotine or tobacco for at least 4 weeks before the procedure. These products include cigarettes, chewing tobacco, and vaping devices, such as e-cigarettes. If you need help quitting, ask your health care provider. If you will be going home right after the procedure, plan to have a responsible adult: Take you home from the hospital or clinic. You will not be allowed to drive. Care for you for the time you are told. Ask your health care provider: How your surgery site will be marked. What steps will be taken to help prevent infection. These may include: Washing skin with a germ-killing soap. Taking antibiotic medicine. What happens during the procedure? An IV may be inserted into one of your veins. You will be given one or more of the following: A medicine to help you relax (sedative). A medicine to numb the area (local anesthetic). This will be injected with a needle into the skin of your penis. An incision will be made to remove or cut the foreskin. Absorbable stitches (sutures) may be used to close the incision. A bandage (dressing) will be applied to the incision site. The IV will be removed. The procedure may vary among health care providers and hospitals. What happens after the procedure? Your blood pressure, heart rate, breathing rate, and blood oxygen level will be monitored until you leave the hospital or clinic.  Do not get out of bed until your health care provider approves. If you were given a sedative during the procedure, it can affect you for several hours. Do not drive or operate machinery until your health  care provider says that it is safe. Summary Circumcision is a surgery to remove or cut the fold of skin that covers the head of the penis. Follow instructions from your health care provider about eating and drinking before your procedure. If you will be going home right after the procedure, plan to have a responsible adult take you home and care for you for the time you are told. This information is not intended to replace advice given to you by your health care provider. Make sure you discuss any questions you have with your health care provider. Document Revised: 10/06/2021 Document Reviewed: 10/06/2021 Elsevier Patient Education  2024 [employer].

## 2023-08-16 ENCOUNTER — Other Ambulatory Visit: Payer: Self-pay

## 2023-08-16 ENCOUNTER — Telehealth: Payer: Self-pay

## 2023-08-16 DIAGNOSIS — N471 Phimosis: Secondary | ICD-10-CM

## 2023-08-16 NOTE — Telephone Encounter (Signed)
Per Dr. Apolinar Junes, Patient is to be scheduled for Circumcision   Marc Stevens was contacted and possible surgical dates were discussed, Monday December 16th, 2024 was agreed upon for surgery.     Patient was directed to call [phone] between 1-3pm the day before surgery to find out surgical arrival time.  Instructions were given not to eat or drink from midnight on the night before surgery and have a driver for the day of surgery. On the surgery day patient was instructed to enter through the [facility] entrance of [facility] report the Same Day Surgery desk.   Pre-Admit Testing will be in contact via phone to set up an interview with the anesthesia team to review your history and medications prior to surgery.   Reminder of this information was sent via MyChart to the patient.

## 2023-08-16 NOTE — Progress Notes (Signed)
   [facility] Surgical Posting Form  Surgery Date: Date: 10/04/2023  Surgeon: Dr. Vanna Scotland, MD  Inpt ( No  )   Outpt (Yes)   Obs ( No  )   Diagnosis: N47.1 Phimosis  -CPT: [phone]  Surgery: Circumcision  Stop Anticoagulations: Yes and also hold ASA   Cardiac/Medical/Pulmonary Clearance needed: no  *Orders entered into EPIC  Date: 08/16/23   *Case booked in [place]  Date: 08/16/23  *Notified pt of Surgery: Date: 08/16/23  PRE-OP UA & CX: no  *Placed into Prior Authorization Work Que Date: 08/16/23  Assistant/laser/rep:No

## 2023-08-16 NOTE — Progress Notes (Signed)
Surgical Physician Order Form [facility]  Dr. Vanna Scotland, MD  * Scheduling expectation : Next Available  *Length of Case:   *Clearance needed: no  *Anticoagulation Instructions: Hold all anticoagulants  *Aspirin Instructions: Hold Aspirin  *Post-op visit Date/Instructions:  1 month follow up  *Diagnosis: Phimosis  *Procedure:  Circumcision (46962)   Additional orders: N/A  -Admit type: OUTpatient  -Anesthesia: General  -VTE Prophylaxis Standing Order SCD's       Other:   -Standing Lab Orders Per Anesthesia    Lab other: None  -Standing Test orders EKG/Chest x-ray per Anesthesia       Test other:   - Medications:  Ancef 2gm IV  -Other orders:  N/A

## 2023-08-23 ENCOUNTER — Ambulatory Visit: Payer: BC Managed Care – PPO | Admitting: Family Medicine

## 2023-08-23 NOTE — Progress Notes (Unsigned)
Annual Physical Exam   Name: Marc Stevens   MRN: [id number]    DOB: [birth year]   Date:08/24/2023  Today's Provider: Jacquelin Hawking, MHS, PA-C Introduced myself to the patient as a PA-C and provided education on APPs in clinical practice.         Subjective  Chief Complaint  Chief Complaint  Patient presents with   Hypertension   Hyperlipidemia   Medical Management of Chronic Issues    HPI  Patient presents for annual CPE and chronic follow up  .   Diet: He is following overall normal dietary intake. He would like to have a meal plan created to help with goals  Exercise: He is walking for about 20-30 minutes, 4 times per week  Sleep: He has OSA and uses CPAP -reports sleep is pretty decent, he uses CPAP 90% of the time. Getting about 5-6 hours per night  Mood: "okay"    HYPERTENSION / HYPERLIPIDEMIA Satisfied with current treatment? Yes- he is seeing Cardiology as well. He would like to get off some of his medications though Duration of hypertension: years BP monitoring frequency: daily BP range: This AM was 155/81 BP medication side effects: no Past BP meds: Amlodipine 10 mg PO every day, Carvedilol 6.25 mg PO BID, Losartan- hydrochlorothiazide 100-25 mg PO every day,  Duration of hyperlipidemia: years Cholesterol medication side effects: no Cholesterol supplements: none Past cholesterol medications: atorvastain (lipitor) Medication compliance: good compliance Aspirin: yes Recent stressors: no Recurrent headaches: no Visual changes: no Palpitations: no Dyspnea: no Chest pain: no Lower extremity edema: no Dizzy/lightheaded: no  Diabetes, Type 2 (unsure if he has prediabetes vs Type 2)  - Last A1c 6.3 %- at [facility] provider  - Medications: Metformin 500 mg PO every day,  He is concerned about his Metformin- newest bottle states to take 1000 mg PO QAM  He is seeing an Endocrinologist - Dr. Alphia Kava in [place]  - Compliance: good compliance  -  Checking BG at home: he is not checking blood glucose  - Eye exam: Overdue- discussed importance of annual eye exam for retinopathy screening  - Foot exam: UTD  - Microalbumin: UTD - Statin: on statin  - PNA vaccine: Discussed importance of vaccine for disease prevention, patient questions answered and education materials  offered.  - Denies symptoms of hypoglycemia, polyuria, polydipsia, numbness extremities, foot ulcers/trauma  Fungal nail infection  Was seen in June for this  Does not appear to have had follow up LFTs during treatment Was taking Lamisil 250 mg PO every day- does not appear to have finished full 12 weeks       Depression: phq 9 is negative    08/24/2023    8:27 AM 04/09/2023    9:13 AM 01/01/2023    9:47 AM 10/02/2022    8:17 AM 10/02/2022    8:09 AM  Depression screen PHQ 2/9  Decreased Interest 1 0 0 1 0  Down, Depressed, Hopeless 0 0 0 0 0  PHQ - 2 Score 1 0 0 1 0  Altered sleeping 0 0 0 0 0  Tired, decreased energy 0 0 0 0 0  Change in appetite 0 0 0 0 0  Feeling bad or failure about yourself  0 0 0 0 0  Trouble concentrating 0 0 0 0 0  Moving slowly or fidgety/restless 0 0 0 0 0  Suicidal thoughts 0 0 0 0 0  PHQ-9 Score 1 0 0 1  0  Difficult doing work/chores Not difficult at all Not difficult at all Not difficult at all Not difficult at all Not difficult at all    Hypertension:  BP Readings from Last 3 Encounters:  08/24/23 130/72  08/13/23 (!) 161/89  06/14/23 (!) 155/88    Obesity: Wt Readings from Last 3 Encounters:  08/24/23 287 lb (130.2 kg)  08/13/23 283 lb 6 oz (128.5 kg)  06/14/23 277 lb 3.2 oz (125.7 kg)   BMI Readings from Last 3 Encounters:  08/24/23 40.03 kg/m  08/13/23 39.52 kg/m  06/14/23 38.66 kg/m     Lipids:  Lab Results  Component Value Date   CHOL 106 04/09/2023   CHOL 131 12/25/2021   CHOL 141 09/06/2020   Lab Results  Component Value Date   HDL 45 04/09/2023   HDL 50 12/25/2021   HDL 58 09/06/2020    Lab Results  Component Value Date   LDLCALC 45 04/09/2023   LDLCALC 66 12/25/2021   LDLCALC 65 09/06/2020   Lab Results  Component Value Date   TRIG 82 04/09/2023   TRIG 69 12/25/2021   TRIG 92 09/06/2020   Lab Results  Component Value Date   CHOLHDL 2.4 04/09/2023   CHOLHDL 2.6 12/25/2021   CHOLHDL 2.4 09/06/2020   No results found for: "LDLDIRECT" Glucose:  Glucose  Date Value Ref Range Status  04/04/2014 96 65 - 99 mg/dL Final   Glucose, Bld  Date Value Ref Range Status  04/09/2023 99 65 - 99 mg/dL Final    Comment:    .            Fasting reference interval .   12/25/2021 102 (H) 65 - 99 mg/dL Final    Comment:    .            Fasting reference interval . For someone without known diabetes, a glucose value between 100 and 125 mg/dL is consistent with prediabetes and should be confirmed with a follow-up test. .   01/20/2021 163 (H) 70 - 99 mg/dL Final    Comment:    Glucose reference range applies only to samples taken after fasting for at least 8 hours.   Glucose-Capillary  Date Value Ref Range Status  03/05/2023 81 70 - 99 mg/dL Final    Comment:    Glucose reference range applies only to samples taken after fasting for at least 8 hours.    Flowsheet Row Office Visit from 08/24/2023 in [facility]  AUDIT-C Score 1         Single STD testing and prevention (HIV/chl/gon/syphilis):  no- declines today  Skin cancer: Discussed monitoring for atypical lesions Prostate cancer screening:  ordered No results found for: "PSA"  Health Maintenance  Topic Date Due   Eye exam for diabetics  08/24/2023*   COVID-19 Vaccine (3 - 2023-24 season) 09/09/2023*   Zoster (Shingles) Vaccine (1 of 2) 11/24/2023*   Flu Shot  01/17/2024*   Hemoglobin A1C  10/09/2023   Yearly kidney function blood test for diabetes  04/08/2024   Yearly kidney health urinalysis for diabetes  04/08/2024   Complete foot exam   04/08/2024   DTaP/Tdap/Td  vaccine (4 - Td or Tdap) 11/14/2027   Colon Cancer Screening  03/04/2030   Hepatitis C Screening  Completed   HIV Screening  Completed   HPV Vaccine  Aged Out  *Topic was postponed. The date shown is not the original due date.     Lung cancer:  Low Dose CT Chest recommended if Age 31-80 years, 30 pack-year currently smoking OR have quit w/in 15years. Patient  not applicable AAA: The USPSTF recommends one-time screening with ultrasonography in men ages 74 to 75 years who have ever smoked. Patient:  not applicable ECG:  NA   Advanced Care Planning: A voluntary discussion about advance care planning including the explanation and discussion of advance directives.  Discussed health care proxy and Living will, and the patient was able to identify a health care proxy as his sister, Marc Stevens.  Patient does not know have a living will in effect at this time.  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   Vitamin D deficiency 04/26/2015   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   Class 2 severe obesity with serious comorbidity and body mass index (BMI) of 37.0 to 37.9 in adult [facility])    ED (erectile dysfunction)    Hyperlipidemia    OSA (obstructive sleep apnea)    HTN (hypertension) 04/18/2012    Past Surgical History:  Procedure Laterality Date   COLONOSCOPY WITH PROPOFOL N/A 02/18/2018   Procedure: COLONOSCOPY WITH PROPOFOL;  Surgeon: Wyline Mood, MD;  Location: [facility] ENDOSCOPY;  Service: Gastroenterology;  Laterality: N/A;   COLONOSCOPY WITH PROPOFOL N/A 03/05/2023   Procedure: COLONOSCOPY WITH PROPOFOL;  Surgeon: Wyline Mood, MD;  Location: [facility] ENDOSCOPY;  Service: Gastroenterology;  Laterality: N/A;   DOPPLER ECHOCARDIOGRAPHY  2013   left kene arthroscopy      LUMBAR LAMINECTOMY/DECOMPRESSION MICRODISCECTOMY N/A 01/30/2015   Procedure: MICRO LUMBAR DECOMPRESSION, BILATERAL FORAMINOTOMIES, MICRODISCECTOMY LUMBAR FOUR TO LUMBAR FIVE;  Surgeon: Jene Every, MD;  Location: [facility] ORS;  Service: Orthopedics;  Laterality: N/A;   right knee surgery     ROTATOR CUFF REPAIR  2007   Left   SHOULDER OPEN ROTATOR CUFF REPAIR Right 08/08/2015   Procedure: RIGHT SHOULDER MINI OPEN ROTATOR CUFF REPAIR SUBACROMIAL DECOMPRESSION ;  Surgeon: Jene Every, MD;  Location: [facility] ORS;  Service: Orthopedics;  Laterality: Right;    Family History  Problem Relation Age of Onset   Dementia Mother    Hypertension Mother    Cancer Father        colon   Heart disease Father    Congestive Heart Failure Father    Hypertension Sister    Dementia Maternal Grandmother    Kidney disease Neg Hx    Prostate cancer Neg Hx    COPD Neg Hx    Diabetes Neg Hx    Stroke Neg Hx    Kidney cancer Neg Hx    Bladder Cancer Neg Hx     Social History   Socioeconomic History   Marital status: Single    Spouse name: Not on file   Number of children: Not on file   Years of education: Not on file   Highest education level: 12th grade  Occupational History    Employer: [employer]    Employer: [employer]  Tobacco Use   Smoking status: Former    Current  packs/day: 0.00    Average packs/day: 0.5 packs/day for 30.0 years (15.0 ttl pk-yrs)    Types: Cigarettes    Start date: 10/19/1974    Quit date: 10/19/2004    Years since quitting: 18.8   Smokeless tobacco: Never  Vaping Use   Vaping status: Never Used  Substance and Sexual Activity   Alcohol use: Yes    Alcohol/week:  4.0 standard drinks of alcohol    Types: 4 Shots of liquor per week    Comment: occasional   Drug use: No   Sexual activity: Yes    Birth control/protection: None  Other Topics Concern   Not on file  Social History Narrative   Not on file   Social Determinants of Health   Financial Resource Strain: Low Risk  (08/22/2023)   Overall Financial Resource Strain (CARDIA)    Difficulty of Paying Living Expenses: Not hard at all  Food Insecurity: No Food Insecurity (08/22/2023)   Hunger Vital Sign    Worried About Running Out of Food in the Last Year: Never true    Ran Out of Food in the Last Year: Never true  Transportation Needs: No Transportation Needs (08/22/2023)   PRAPARE - Administrator, Civil Service (Medical): No    Lack of Transportation (Non-Medical): No  Physical Activity: Insufficiently Active (08/22/2023)   Exercise Vital Sign    Days of Exercise per Week: 4 days    Minutes of Exercise per Session: 20 min  Stress: No Stress Concern Present (08/22/2023)   [employer] of Occupational Health - Occupational Stress Questionnaire    Feeling of Stress : Not at all  Social Connections: Socially Isolated (08/22/2023)   Social Connection and Isolation Panel [NHANES]    Frequency of Communication with Friends and Family: More than three times a week    Frequency of Social Gatherings with Friends and Family: Twice a week    Attends Religious Services: Never    Database administrator or Organizations: No    Attends Engineer, structural: Not on file    Marital Status: Divorced  Intimate Partner Violence: Not At Risk (12/25/2021)   Humiliation, Afraid, Rape, and Kick questionnaire    Fear of Current or Ex-Partner: No    Emotionally Abused: No    Physically Abused: No    Sexually Abused: No     Current Outpatient Medications:    amLODipine (NORVASC) 10 MG tablet, Take 10 mg by mouth daily., Disp: , Rfl:    aspirin EC 81 MG tablet, Take 1 tablet (81 mg total)  by mouth every morning. Resume 4 days post-op, Disp: , Rfl:    atorvastatin (LIPITOR) 10 MG tablet, Take 1 tablet (10 mg total) by mouth daily., Disp: 90 tablet, Rfl: 3   clomiPHENE (CLOMID) 50 MG tablet, Take 1/2 tablet daily, Disp: 30 tablet, Rfl: 0   diclofenac Sodium (VOLTAREN) 1 % GEL, , Disp: , Rfl:    gabapentin (NEURONTIN) 600 MG tablet, Take 1 tablet (600 mg total) by mouth 3 (three) times daily., Disp: 270 tablet, Rfl: 3   ketoconazole (NIZORAL) 2 % cream, Apply topically 2 (two) times daily., Disp: 30 g, Rfl: 1   losartan-hydrochlorothiazide (HYZAAR) 100-25 MG tablet, TAKE 1 TABLET BY MOUTH EVERY DAY, Disp: 90 tablet, Rfl: 1   Na Sulfate-K Sulfate-Mg Sulf 17.5-3.13-1.6 GM/177ML SOLN, Take by mouth., Disp: , Rfl:    sildenafil (VIAGRA) 100 MG tablet, TAKE 1 TABLET BY MOUTH DAILY, Disp: 30 tablet, Rfl: 3  VITAMIN D, ERGOCALCIFEROL, PO, Take 5,000 Units by mouth daily., Disp: , Rfl:    carvedilol (COREG) 12.5 MG tablet, Take 0.5 tablets (6.25 mg total) by mouth 2 (two) times daily with a meal., Disp: , Rfl:   No Known Allergies   Review of Systems  Constitutional:  Negative for chills, fever, malaise/fatigue and weight loss.  HENT:  Negative for hearing loss, nosebleeds, sore throat and tinnitus.   Eyes:  Negative for blurred vision, double vision and photophobia.  Respiratory:  Negative for cough, shortness of breath and wheezing.   Cardiovascular:  Negative for chest pain, palpitations and leg swelling.  Gastrointestinal:  Negative for blood in stool, constipation, diarrhea, heartburn, nausea and vomiting.  Skin:  Negative for itching and rash.  Neurological:  Negative for dizziness, tingling, tremors, weakness and headaches.  Psychiatric/Behavioral:  Negative for depression, memory loss and suicidal ideas. The patient is not nervous/anxious and does not have insomnia.        Objective  Vitals:   08/24/23 0827  BP: 130/72  Pulse: 68  Resp: 16  SpO2: 96%  Weight: 287  lb (130.2 kg)  Height: 5\' 11"  (1.803 m)    Body mass index is 40.03 kg/m.  Physical Exam Vitals reviewed.  Constitutional:      General: He is awake.     Appearance: Normal appearance. He is well-developed and well-groomed.  HENT:     Head: Normocephalic and atraumatic.     Right Ear: Hearing, tympanic membrane and ear canal normal.     Left Ear: Hearing, tympanic membrane and ear canal normal.     Nose: Nose normal.     Mouth/Throat:     Lips: Pink.     Mouth: Mucous membranes are moist. No lacerations or oral lesions.     Pharynx: Oropharynx is clear. Uvula midline. No pharyngeal swelling, oropharyngeal exudate or posterior oropharyngeal erythema.  Eyes:     General: Lids are normal. Gaze aligned appropriately.     Extraocular Movements: Extraocular movements intact.     Right eye: Normal extraocular motion and no nystagmus.     Left eye: Normal extraocular motion and no nystagmus.     Conjunctiva/sclera: Conjunctivae normal.     Pupils: Pupils are equal, round, and reactive to light.  Neck:     Thyroid: No thyroid mass, thyromegaly or thyroid tenderness.     Trachea: Phonation normal.  Cardiovascular:     Rate and Rhythm: Normal rate and regular rhythm.     Pulses: Normal pulses.          Radial pulses are 2+ on the right side and 2+ on the left side.     Heart sounds: Normal heart sounds. No murmur heard.    No friction rub. No gallop.  Pulmonary:     Effort: Pulmonary effort is normal.     Breath sounds: Normal breath sounds. No decreased air movement. No decreased breath sounds, wheezing, rhonchi or rales.  Abdominal:     General: Abdomen is flat. Bowel sounds are normal.     Palpations: Abdomen is soft.     Tenderness: There is no abdominal tenderness.  Musculoskeletal:     Cervical back: Normal range of motion and neck supple.     Right lower leg: No edema.     Left lower leg: No edema.  Lymphadenopathy:     Head:     Right side of head: No submental,  submandibular or preauricular adenopathy.     Left side  of head: No submental, submandibular or preauricular adenopathy.     Cervical: No cervical adenopathy.     Right cervical: No superficial or posterior cervical adenopathy.    Left cervical: No superficial or posterior cervical adenopathy.     Upper Body:     Right upper body: No supraclavicular adenopathy.     Left upper body: No supraclavicular adenopathy.  Skin:    General: Skin is warm and dry.  Neurological:     General: No focal deficit present.     Mental Status: He is alert and oriented to person, place, and time. Mental status is at baseline.     GCS: GCS eye subscore is 4. GCS verbal subscore is 5. GCS motor subscore is 6.     Cranial Nerves: No cranial nerve deficit, dysarthria or facial asymmetry.     Motor: No weakness, tremor, atrophy or abnormal muscle tone.     Gait: Gait is intact.     Deep Tendon Reflexes:     Reflex Scores:      Patellar reflexes are 2+ on the right side and 2+ on the left side. Psychiatric:        Attention and Perception: Attention and perception normal.        Mood and Affect: Mood and affect normal.        Speech: Speech normal.        Behavior: Behavior normal. Behavior is cooperative.        Thought Content: Thought content normal.        Cognition and Memory: Cognition normal.        Judgment: Judgment normal.      Recent Results (from the past 2160 hour(s))  PSA     Status: None   Collection Time: 06/10/23  8:23 AM  Result Value Ref Range   Prostatic Specific Antigen 1.03 0.00 - 4.00 ng/mL    Comment: (NOTE) While PSA levels of <=4.00 ng/ml are reported as reference range, some men with levels below 4.00 ng/ml can have prostate cancer and many men with PSA above 4.00 ng/ml do not have prostate cancer.  Other tests such as free PSA, age specific reference ranges, PSA velocity and PSA doubling time may be helpful especially in men less than 21 years old. Performed at [facility], [address], [place], [place] [address]   Testosterone     Status: None   Collection Time: 06/10/23  8:23 AM  Result Value Ref Range   Testosterone 492 264 - 916 ng/dL    Comment: (NOTE) Adult male reference interval is based on a population of healthy nonobese males (BMI <30) between 46 and 68 years old. Travison, et.al. JCEM [phone]. PMID: [id number]. Performed At: [facility] [address] [place], [place] [id number] Jolene Schimke MD VQ:[id number]      Fall Risk:    08/24/2023    8:26 AM 04/09/2023    9:12 AM 01/01/2023    9:47 AM 10/02/2022    8:17 AM 10/02/2022    8:09 AM  Fall Risk   Falls in the past year? 0 0 0 0 0  Number falls in past yr: 0 0 0 0 0  Injury with Fall? 0 0 0 0 0  Risk for fall due to : No Fall Risks No Fall Risks No Fall Risks  No Fall Risks  Follow up Falls prevention discussed;Education provided;Falls evaluation completed Falls prevention discussed;Education provided;Falls evaluation completed Falls prevention discussed;Education provided;Falls evaluation completed  Falls prevention discussed;Education provided;Falls evaluation completed     Functional Status Survey: Is the patient deaf or have difficulty hearing?: No Does the patient have difficulty seeing, even when wearing glasses/contacts?: Yes Does the patient have difficulty concentrating, remembering, or making decisions?: Yes Does the patient have difficulty walking or climbing stairs?: No Does the patient have difficulty dressing or bathing?: Yes Does the patient have difficulty doing errands alone such as visiting a doctor's office or shopping?: No    Assessment & Plan  Problem List Items Addressed This Visit       Cardiovascular and Mediastinum   HTN (hypertension) (Chronic)    Chronic, historic conditions Close to goal in office today - may be having elevations at home though Currently taking Amlodipine 10 mg PO every day, Carvedilol 6.25 mg PO  BID, Losartan- hydrochlorothiazide 100-25 mg PO every day, Cardiology is trying to wean him off Carvedilol  Continue collaboration with Cardiology  Continue current regimen for now Follow up in 3 months or sooner if concerns arise        Relevant Medications   amLODipine (NORVASC) 10 MG tablet   carvedilol (COREG) 12.5 MG tablet   Other Relevant Orders   COMPLETE METABOLIC PANEL WITH GFR   CBC w/Diff/Platelet     Respiratory   OSA (obstructive sleep apnea) (Chronic)    Chronic, historic condition Appears to be well managed with CPAP at this time- reports he is using this about 90% of the time and reports doing well with this Continue with CPAP use and weight loss efforts Follow up in 6 months or sooner if concerns arise          Other   Hyperlipidemia (Chronic)    Chronic, historic condition Appears well managed with current regimen comprised of Atorvastatin 10 mg PO every day Recheck lipid panel today. Results to dictate further management  Continue current regimen and dietary changes  Follow up in 6 months or sooner if concerns arise        Relevant Medications   amLODipine (NORVASC) 10 MG tablet   carvedilol (COREG) 12.5 MG tablet   Other Relevant Orders   Lipid Profile   Class 2 severe obesity with serious comorbidity and body mass index (BMI) of 37.0 to 37.9 in adult [facility])    Chronic, ongoing Patient would like GLP1 agonist for assistance with glycemic control and weight loss but has been told this is not covered Recommend he proceeds with diet and exercise efforts  Follow up in 6 months or sooner if concerns arise        Vitamin D deficiency    Recheck labs Results to dictate further management        Relevant Orders   Vitamin D (25 hydroxy)   Pre-diabetes    Reviewed A1c results available through 2013- none appear to have gone above 6.4% although he has has several random glucose levels in diabetic range Unsure if he has T2DM vs prediabetic  He is  taking Metformin 500 mg PO every day- advised him to dc for now and focus on diet and exercise for management  Recheck A1c today- Results to dictate further management  Follow up in 3 months or sooner if concerns arise        Advanced care planning/counseling discussion    A voluntary discussion about advance care planning including the explanation and discussion of advance directives was extensively discussed  with the patient for 3  minutes with patient and myself  present.  Explanation about the health care proxy and Living will was reviewed and packet with forms with explanation of how to fill them out was given.  During this discussion, the patient was able to identify a health care proxy as  Marc Stevens, his sister,  and plans to fill out the paperwork required.  Patient was offered a separate Advance Care Planning visit for further assistance with forms.         Other Visit Diagnoses     Annual physical exam    -  Primary   Relevant Orders   COMPLETE METABOLIC PANEL WITH GFR   CBC w/Diff/Platelet   PSA   Lipid Profile   Vitamin D (25 hydroxy)   Screening for prostate cancer       Relevant Orders   PSA       -Prostate cancer screening and PSA options (with potential risks and benefits of testing vs not testing) were discussed along with recent recs/guidelines. -USPSTF grade A and B recommendations reviewed with patient; age-appropriate recommendations, preventive care, screening tests, etc discussed and encouraged; healthy living encouraged; see AVS for patient education given to patient -Discussed importance of 150 minutes of physical activity weekly, eat two servings of fish weekly, eat one serving of tree nuts ( cashews, pistachios, pecans, almonds.Marland Kitchen) every other day, eat 6 servings of fruit/vegetables daily and drink plenty of water and avoid sweet beverages.  -Reviewed Health Maintenance: yes  Return in about 3 months (around 11/24/2023) for Prediabetes , HTN,  HLD.   I, Sitlaly Gudiel E Johndaniel Catlin, PA-C, have reviewed all documentation for this visit. The documentation on 08/24/23 for the exam, diagnosis, procedures, and orders are all accurate and complete.   Jacquelin Hawking, MHS, PA-C [facility]

## 2023-08-24 ENCOUNTER — Ambulatory Visit: Payer: BC Managed Care – PPO | Admitting: Physician Assistant

## 2023-08-24 ENCOUNTER — Encounter: Payer: Self-pay | Admitting: Physician Assistant

## 2023-08-24 VITALS — BP 130/72 | HR 68 | Resp 16 | Ht 71.0 in | Wt 287.0 lb

## 2023-08-24 DIAGNOSIS — E559 Vitamin D deficiency, unspecified: Secondary | ICD-10-CM

## 2023-08-24 DIAGNOSIS — G4733 Obstructive sleep apnea (adult) (pediatric): Secondary | ICD-10-CM

## 2023-08-24 DIAGNOSIS — E782 Mixed hyperlipidemia: Secondary | ICD-10-CM

## 2023-08-24 DIAGNOSIS — Z7984 Long term (current) use of oral hypoglycemic drugs: Secondary | ICD-10-CM

## 2023-08-24 DIAGNOSIS — Z6841 Body Mass Index (BMI) 40.0 and over, adult: Secondary | ICD-10-CM

## 2023-08-24 DIAGNOSIS — Z0000 Encounter for general adult medical examination without abnormal findings: Secondary | ICD-10-CM

## 2023-08-24 DIAGNOSIS — E785 Hyperlipidemia, unspecified: Secondary | ICD-10-CM | POA: Diagnosis not present

## 2023-08-24 DIAGNOSIS — Z7189 Other specified counseling: Secondary | ICD-10-CM

## 2023-08-24 DIAGNOSIS — I10 Essential (primary) hypertension: Secondary | ICD-10-CM | POA: Diagnosis not present

## 2023-08-24 DIAGNOSIS — Z125 Encounter for screening for malignant neoplasm of prostate: Secondary | ICD-10-CM

## 2023-08-24 DIAGNOSIS — R7303 Prediabetes: Secondary | ICD-10-CM

## 2023-08-24 DIAGNOSIS — E1129 Type 2 diabetes mellitus with other diabetic kidney complication: Secondary | ICD-10-CM

## 2023-08-24 DIAGNOSIS — E66812 Obesity, class 2: Secondary | ICD-10-CM

## 2023-08-24 MED ORDER — CARVEDILOL 12.5 MG PO TABS: 6.2500 mg | ORAL_TABLET | Freq: Two times a day (BID) | ORAL | Status: DC

## 2023-08-24 NOTE — Patient Instructions (Addendum)
Please stop your Metformin and focus on diet and exercise to help manage your prediabetes.   It was nice to meet you and I appreciate the opportunity to be involved in your care If you were satisfied with the care you received from me, I would greatly appreciate you saying so in the after-visit survey that is sent out following our visit.

## 2023-08-24 NOTE — Assessment & Plan Note (Signed)
Reviewed A1c results available through 2013- none appear to have gone above 6.4% although he has has several random glucose levels in diabetic range Unsure if he has T2DM vs prediabetic  He is taking Metformin 500 mg PO every day- advised him to dc for now and focus on diet and exercise for management  Recheck A1c today- Results to dictate further management  Follow up in 3 months or sooner if concerns arise

## 2023-08-24 NOTE — Assessment & Plan Note (Signed)
Chronic, ongoing Patient would like GLP1 agonist for assistance with glycemic control and weight loss but has been told this is not covered Recommend he proceeds with diet and exercise efforts  Follow up in 6 months or sooner if concerns arise

## 2023-08-24 NOTE — Assessment & Plan Note (Signed)
Chronic, historic condition Appears well managed with current regimen comprised of Atorvastatin 10 mg PO every day Recheck lipid panel today. Results to dictate further management  Continue current regimen and dietary changes  Follow up in 6 months or sooner if concerns arise

## 2023-08-24 NOTE — Assessment & Plan Note (Signed)
Chronic, historic conditions Close to goal in office today - may be having elevations at home though Currently taking Amlodipine 10 mg PO every day, Carvedilol 6.25 mg PO BID, Losartan- hydrochlorothiazide 100-25 mg PO every day, Cardiology is trying to wean him off Carvedilol  Continue collaboration with Cardiology  Continue current regimen for now Follow up in 3 months or sooner if concerns arise

## 2023-08-24 NOTE — Assessment & Plan Note (Signed)
Recheck labs Results to dictate further management  

## 2023-08-24 NOTE — Assessment & Plan Note (Signed)
Chronic, historic condition Appears to be well managed with CPAP at this time- reports he is using this about 90% of the time and reports doing well with this Continue with CPAP use and weight loss efforts Follow up in 6 months or sooner if concerns arise

## 2023-08-24 NOTE — Assessment & Plan Note (Signed)
A voluntary discussion about advance care planning including the explanation and discussion of advance directives was extensively discussed  with the patient for 3  minutes with patient and myself  present.  Explanation about the health care proxy and Living will was reviewed and packet with forms with explanation of how to fill them out was given.  During this discussion, the patient was able to identify a health care proxy as  Marc Stevens, his sister,  and plans to fill out the paperwork required.  Patient was offered a separate Advance Care Planning visit for further assistance with forms.

## 2023-08-25 LAB — CBC WITH DIFFERENTIAL/PLATELET
Absolute Lymphocytes: 1445 {cells}/uL (ref 850–3900)
Absolute Monocytes: 576 {cells}/uL (ref 200–950)
Basophils Absolute: 39 {cells}/uL (ref 0–200)
Basophils Relative: 0.9 %
Eosinophils Absolute: 181 {cells}/uL (ref 15–500)
Eosinophils Relative: 4.2 %
HCT: 46.4 % (ref 38.5–50.0)
Hemoglobin: 14.2 g/dL (ref 13.2–17.1)
MCH: 20.1 pg — ABNORMAL LOW (ref 27.0–33.0)
MCHC: 30.6 g/dL — ABNORMAL LOW (ref 32.0–36.0)
MCV: 65.7 fL — ABNORMAL LOW (ref 80.0–100.0)
MPV: 9.7 fL (ref 7.5–12.5)
Monocytes Relative: 13.4 %
Neutro Abs: 2060 {cells}/uL (ref 1500–7800)
Neutrophils Relative %: 47.9 %
Platelets: 280 Thousand/uL (ref 140–400)
RBC: 7.06 Million/uL — ABNORMAL HIGH (ref 4.20–5.80)
RDW: 18.4 % — ABNORMAL HIGH (ref 11.0–15.0)
Total Lymphocyte: 33.6 %
WBC: 4.3 Thousand/uL (ref 3.8–10.8)

## 2023-08-25 LAB — COMPLETE METABOLIC PANEL WITHOUT GFR
AG Ratio: 1.7 (calc) (ref 1.0–2.5)
ALT: 29 U/L (ref 9–46)
AST: 24 U/L (ref 10–35)
Albumin: 4 g/dL (ref 3.6–5.1)
Alkaline phosphatase (APISO): 84 U/L (ref 35–144)
BUN: 19 mg/dL (ref 7–25)
CO2: 32 mmol/L (ref 20–32)
Calcium: 10.1 mg/dL (ref 8.6–10.3)
Chloride: 105 mmol/L (ref 98–110)
Creat: 1.09 mg/dL (ref 0.70–1.35)
Globulin: 2.4 g/dL (ref 1.9–3.7)
Glucose, Bld: 102 mg/dL — ABNORMAL HIGH (ref 65–99)
Potassium: 4.4 mmol/L (ref 3.5–5.3)
Sodium: 144 mmol/L (ref 135–146)
Total Bilirubin: 0.3 mg/dL (ref 0.2–1.2)
Total Protein: 6.4 g/dL (ref 6.1–8.1)
eGFR: 77 mL/min/1.73m2

## 2023-08-25 LAB — LIPID PANEL
Cholesterol: 125 mg/dL (ref <200)
HDL: 52 mg/dL (ref >=40)
LDL Cholesterol (Calc): 56 mg/dL
Non-HDL Cholesterol (Calc): 73 mg/dL (ref <130)
Total CHOL/HDL Ratio: 2.4 (calc) (ref <5.0)
Triglycerides: 87 mg/dL (ref <150)

## 2023-08-25 LAB — VITAMIN D 25 HYDROXY (VIT D DEFICIENCY, FRACTURES): Vit D, 25-Hydroxy: 57 ng/mL (ref 30–100)

## 2023-08-25 LAB — PSA: PSA: 0.87 ng/mL (ref <=4.00)

## 2023-08-27 NOTE — Progress Notes (Signed)
Your labs are back Your electrolytes, liver and kidney function are overall in normal ranges Your CBC appears overall stable compared to previous results.  No signs of anemia Your prostate testing was normal Your cholesterol looks good Your vitamin D is improved and is in normal range Please continue current medication regimen as directed.  Please let us know if you have any questions or concerns

## 2023-09-07 DIAGNOSIS — N529 Male erectile dysfunction, unspecified: Secondary | ICD-10-CM

## 2023-09-07 MED ORDER — SILDENAFIL CITRATE 100 MG PO TABS: ORAL_TABLET | ORAL | 3 refills | Status: DC

## 2023-09-09 ENCOUNTER — Encounter: Payer: Self-pay | Admitting: Physician Assistant

## 2023-09-13 NOTE — Telephone Encounter (Signed)
Can you send the patient your fax number? He will have eye doc send his records.

## 2023-09-14 ENCOUNTER — Encounter: Payer: Self-pay | Admitting: Physician Assistant

## 2023-09-21 ENCOUNTER — Encounter: Payer: Self-pay | Admitting: Urology

## 2023-09-23 LAB — HM DIABETES EYE EXAM

## 2023-09-24 ENCOUNTER — Ambulatory Visit: Payer: Self-pay | Admitting: *Deleted

## 2023-09-24 NOTE — Telephone Encounter (Signed)
Called w/no anaswer. Will try again

## 2023-09-24 NOTE — Telephone Encounter (Signed)
Summary: BP Rx changes made   [facility] Dec called in on behalf of patient's request. Heart care has altered some of the patient's BP medications. Pt requested their office touch base with his PCP so alterations can be discussed.  [facility] Dec requesting call back         Called [facility] Dec - Left message on machine to return our call.

## 2023-09-24 NOTE — Telephone Encounter (Signed)
Summary: BP Rx changes made   Huntley Dec called in on behalf of patient's request. Heart care has altered some of the patient's BP medications. Pt requested their office touch base with his PCP so alterations can be discussed.  Huntley Dec requesting call back

## 2023-09-24 NOTE — Telephone Encounter (Signed)
Marc Stevens states the following medication changes - Coreg decreased to 3.125 mg twice daily, Norvasc increased to 10 mg daily and they want to change pt. From losartan to valsartan. Please advise Marc Stevens.

## 2023-09-24 NOTE — Telephone Encounter (Signed)
Called w/ no answer  °

## 2023-09-24 NOTE — Telephone Encounter (Signed)
Summary: BP Rx changes made   Huntley Dec called in on behalf of patient's request. Heart care has altered some of the patient's BP medications. Pt requested their office touch base with his PCP so alterations can be discussed.  Huntley Dec requesting call back      Called [facility] Dec - left message on machine requesting a call back. After 3 attempts unable to reach [place]. I will forward encounter to Clinic for follow up.

## 2023-09-24 NOTE — Telephone Encounter (Signed)
Will defer to their recommendations as there has been mild improvement with current regimen. He should already be taking Amlodipine 10 mg PO every day though. Should he continue hydrochlorothiazide?

## 2023-09-27 ENCOUNTER — Encounter
Admission: RE | Admit: 2023-09-27 | Discharge: 2023-09-27 | Disposition: A | Discharge status: Home / Self Care | Payer: BC Managed Care – PPO | Source: Ambulatory Visit | Attending: Urology | Admitting: Urology

## 2023-09-27 ENCOUNTER — Other Ambulatory Visit: Payer: Self-pay

## 2023-09-27 HISTORY — DX: Cardiomegaly: I51.7

## 2023-09-27 HISTORY — DX: Prediabetes: R73.03

## 2023-09-27 NOTE — Patient Instructions (Signed)
Your procedure is scheduled on: Monday 10/04/23 To find out your arrival time, please call [phone] between 1PM - 3PM on:   Friday 10/01/23 Report to the Registration Desk on the 1st floor of the [facility]. Free Valet parking is available.  If your arrival time is 6:00 am, do not arrive before that time as the [facility] entrance doors do not open until 6:00 am.  REMEMBER: Instructions that are not followed completely may result in serious medical risk, up to and including death; or upon the discretion of your surgeon and anesthesiologist your surgery may need to be rescheduled.  Do not eat food or drink any liquids after midnight the night before surgery.  No gum chewing or hard candies.  One week prior to surgery: Stop Anti-inflammatories (NSAIDS) such as Advil, Aleve, Ibuprofen, Motrin, Naproxen, Naprosyn and Aspirin based products such as Excedrin, Goody's Powder, BC Powder. You may however, continue to take Tylenol if needed for pain up until the day of surgery.  Stop ANY OVER THE COUNTER supplements and vitamins until after surgery.  Continue taking all prescribed medications with the exception of the following: sildenafil (VIAGRA) last dose Friday 10/01/23.  TAKE ONLY THESE MEDICATIONS THE MORNING OF SURGERY WITH A SIP OF WATER:  amLODipine (NORVASC) 10 MG tablet  carvedilol (COREG) 6.25 MG tablet  gabapentin (NEURONTIN) 600 MG tablet   No Alcohol for 24 hours before or after surgery.  No Smoking including e-cigarettes for 24 hours before surgery.  No chewable tobacco products for at least 6 hours before surgery.  No nicotine patches on the day of surgery.  Do not use any "recreational" drugs for at least a week (preferably 2 weeks) before your surgery.  Please be advised that the combination of cocaine and anesthesia may have negative outcomes, up to and including death. If you test positive for cocaine, your surgery will be cancelled.  On the morning of  surgery brush your teeth with toothpaste and water, you may rinse your mouth with mouthwash if you wish. Do not swallow any toothpaste or mouthwash.  Use CHG Soap or wipes as directed on instruction sheet. Shower with your regular soap.  Do not wear lotions, powders, or perfumes.   Do not shave body hair from the neck down 48 hours before surgery.  Wear clean comfortable clothing (specific to your surgery type) to the hospital.  Do not wear jewelry, make-up, hairpins, clips or nail polish.  For welded (permanent) jewelry: bracelets, anklets, waist bands, etc.  Please have this removed prior to surgery.  If it is not removed, there is a chance that hospital personnel will need to cut it off on the day of surgery. Contact lenses, hearing aids and dentures may not be worn into surgery.  Bring your C-PAP to the hospital in case you may have to spend the night. You may leave it in the car.  Do not bring valuables to the hospital. [facility] is not responsible for any missing/lost belongings or valuables.   Notify your doctor if there is any change in your medical condition (cold, fever, infection).  If you are being discharged the day of surgery, you will not be allowed to drive home. You will need a responsible individual to drive you home and stay with you for 24 hours after surgery.   If you are taking public transportation, you will need to have a responsible individual with you.  If you are being admitted to the hospital overnight, leave your suitcase  in the car. After surgery it may be brought to your room.  In case of increased patient census, it may be necessary for you, the patient, to continue your postoperative care in the Same Day Surgery department.  After surgery, you can help prevent lung complications by doing breathing exercises.  Take deep breaths and cough every 1-2 hours. Your doctor may order a device called an Incentive Spirometer to help you take deep breaths. When  coughing or sneezing, hold a pillow firmly against your incision with both hands. This is called "splinting." Doing this helps protect your incision. It also decreases belly discomfort.  Surgery Visitation Policy:  Patients undergoing a surgery or procedure may have two family members or support persons with them as long as the person is not COVID-19 positive or experiencing its symptoms.   Inpatient Visitation:    Visiting hours are 7 a.m. to 8 p.m. Up to four visitors are allowed at one time in a patient room. The visitors may rotate out with other people during the day. One designated support person (adult) may remain overnight.  Please call the Pre-admissions Testing Dept. at [phone] if you have any questions about these instructions.

## 2023-10-04 ENCOUNTER — Ambulatory Visit: Payer: BC Managed Care – PPO | Admitting: Urgent Care

## 2023-10-04 ENCOUNTER — Encounter: Payer: Self-pay | Admitting: Urology

## 2023-10-04 ENCOUNTER — Other Ambulatory Visit: Payer: Self-pay

## 2023-10-04 ENCOUNTER — Encounter
Admission: RE | Disposition: A | Discharge status: Home / Self Care | Payer: Self-pay | Source: Ambulatory Visit | Attending: Urology

## 2023-10-04 ENCOUNTER — Ambulatory Visit: Payer: BC Managed Care – PPO | Admitting: Certified Registered"

## 2023-10-04 ENCOUNTER — Ambulatory Visit
Admission: RE | Admit: 2023-10-04 | Discharge: 2023-10-04 | Disposition: A | Discharge status: Home / Self Care | Payer: BC Managed Care – PPO | Source: Ambulatory Visit | Attending: Urology | Admitting: Urology

## 2023-10-04 DIAGNOSIS — G4733 Obstructive sleep apnea (adult) (pediatric): Secondary | ICD-10-CM | POA: Insufficient documentation

## 2023-10-04 DIAGNOSIS — E119 Type 2 diabetes mellitus without complications: Secondary | ICD-10-CM | POA: Insufficient documentation

## 2023-10-04 DIAGNOSIS — Z7984 Long term (current) use of oral hypoglycemic drugs: Secondary | ICD-10-CM | POA: Insufficient documentation

## 2023-10-04 DIAGNOSIS — N471 Phimosis: Secondary | ICD-10-CM | POA: Diagnosis not present

## 2023-10-04 DIAGNOSIS — I10 Essential (primary) hypertension: Secondary | ICD-10-CM | POA: Diagnosis not present

## 2023-10-04 DIAGNOSIS — Z87891 Personal history of nicotine dependence: Secondary | ICD-10-CM | POA: Diagnosis not present

## 2023-10-04 HISTORY — PX: CIRCUMCISION: SHX1350

## 2023-10-04 SURGERY — CIRCUMCISION, ADULT
Anesthesia: General

## 2023-10-04 MED ORDER — CEFAZOLIN SODIUM-DEXTROSE 2-4 GM/100ML-% IV SOLN
2.0000 g | INTRAVENOUS | Status: AC
  Administered 2023-10-04: 3 g via INTRAVENOUS

## 2023-10-04 MED ORDER — CHLORHEXIDINE GLUCONATE 0.12 % MT SOLN
15.0000 mL | Freq: Once | OROMUCOSAL | Status: AC
  Administered 2023-10-04: 15 mL via OROMUCOSAL

## 2023-10-04 MED ORDER — FENTANYL CITRATE (PF) 100 MCG/2ML IJ SOLN
INTRAMUSCULAR | Status: DC | PRN
  Administered 2023-10-04: 50 ug via INTRAVENOUS

## 2023-10-04 MED ORDER — MIDAZOLAM HCL 2 MG/2ML IJ SOLN
INTRAMUSCULAR | Status: DC | PRN
  Administered 2023-10-04: 2 mg via INTRAVENOUS

## 2023-10-04 MED ORDER — DEXAMETHASONE SODIUM PHOSPHATE 10 MG/ML IJ SOLN
INTRAMUSCULAR | Status: AC
  Filled 2023-10-04: qty 1

## 2023-10-04 MED ORDER — MIDAZOLAM HCL 2 MG/2ML IJ SOLN
INTRAMUSCULAR | Status: AC
  Filled 2023-10-04: qty 2

## 2023-10-04 MED ORDER — KETOROLAC TROMETHAMINE 30 MG/ML IJ SOLN
INTRAMUSCULAR | Status: DC | PRN
  Administered 2023-10-04: 30 mg via INTRAVENOUS

## 2023-10-04 MED ORDER — FENTANYL CITRATE (PF) 100 MCG/2ML IJ SOLN: 25.0000 ug | INTRAMUSCULAR | Status: DC | PRN

## 2023-10-04 MED ORDER — LIDOCAINE 1% INJECTION FOR CIRCUMCISION
INJECTION | INTRAVENOUS | Status: DC | PRN
  Administered 2023-10-04: 20 mL via SUBCUTANEOUS

## 2023-10-04 MED ORDER — KETOROLAC TROMETHAMINE 30 MG/ML IJ SOLN
INTRAMUSCULAR | Status: AC
  Filled 2023-10-04: qty 1

## 2023-10-04 MED ORDER — PROPOFOL 10 MG/ML IV BOLUS
INTRAVENOUS | Status: AC
  Filled 2023-10-04: qty 20

## 2023-10-04 MED ORDER — OXYCODONE HCL 5 MG PO TABS: 5.0000 mg | ORAL_TABLET | Freq: Once | ORAL | Status: DC | PRN

## 2023-10-04 MED ORDER — LIDOCAINE HCL (CARDIAC) PF 100 MG/5ML IV SOSY
PREFILLED_SYRINGE | INTRAVENOUS | Status: DC | PRN
  Administered 2023-10-04: 100 mg via INTRATRACHEAL

## 2023-10-04 MED ORDER — ONDANSETRON HCL 4 MG/2ML IJ SOLN
INTRAMUSCULAR | Status: DC | PRN
  Administered 2023-10-04: 4 mg via INTRAVENOUS

## 2023-10-04 MED ORDER — LIDOCAINE HCL (PF) 2 % IJ SOLN
INTRAMUSCULAR | Status: AC
  Filled 2023-10-04: qty 5

## 2023-10-04 MED ORDER — HYDROCODONE-ACETAMINOPHEN 5-325 MG PO TABS: 1.0000 | ORAL_TABLET | Freq: Four times a day (QID) | ORAL | 0 refills | Status: DC | PRN

## 2023-10-04 MED ORDER — LIDOCAINE HCL (PF) 1 % IJ SOLN
INTRAMUSCULAR | Status: AC
  Filled 2023-10-04: qty 30

## 2023-10-04 MED ORDER — CEFAZOLIN SODIUM-DEXTROSE 2-4 GM/100ML-% IV SOLN
INTRAVENOUS | Status: AC
  Filled 2023-10-04: qty 100

## 2023-10-04 MED ORDER — ONDANSETRON HCL 4 MG/2ML IJ SOLN: 4.0000 mg | Freq: Once | INTRAMUSCULAR | Status: DC | PRN

## 2023-10-04 MED ORDER — CHLORHEXIDINE GLUCONATE 0.12 % MT SOLN
OROMUCOSAL | Status: AC
  Filled 2023-10-04: qty 15

## 2023-10-04 MED ORDER — ORAL CARE MOUTH RINSE: 15.0000 mL | Freq: Once | OROMUCOSAL | Status: AC

## 2023-10-04 MED ORDER — ACETAMINOPHEN 10 MG/ML IV SOLN
INTRAVENOUS | Status: AC
  Filled 2023-10-04: qty 100

## 2023-10-04 MED ORDER — FENTANYL CITRATE (PF) 100 MCG/2ML IJ SOLN
INTRAMUSCULAR | Status: AC
  Filled 2023-10-04: qty 2

## 2023-10-04 MED ORDER — LACTATED RINGERS IV SOLN
INTRAVENOUS | Status: DC
Start: 2023-10-04 — End: 2023-10-05

## 2023-10-04 MED ORDER — EPHEDRINE SULFATE-NACL 50-0.9 MG/10ML-% IV SOSY
PREFILLED_SYRINGE | INTRAVENOUS | Status: DC | PRN
  Administered 2023-10-04: 5 mg via INTRAVENOUS

## 2023-10-04 MED ORDER — CEFAZOLIN SODIUM-DEXTROSE 1-4 GM/50ML-% IV SOLN
INTRAVENOUS | Status: AC
  Filled 2023-10-04: qty 50

## 2023-10-04 MED ORDER — ACETAMINOPHEN 10 MG/ML IV SOLN: 1000.0000 mg | Freq: Once | INTRAVENOUS | Status: DC | PRN

## 2023-10-04 MED ORDER — EPHEDRINE 5 MG/ML INJ
INTRAVENOUS | Status: AC
  Filled 2023-10-04: qty 5

## 2023-10-04 MED ORDER — ONDANSETRON HCL 4 MG/2ML IJ SOLN
INTRAMUSCULAR | Status: AC
  Filled 2023-10-04: qty 2

## 2023-10-04 MED ORDER — ACETAMINOPHEN 10 MG/ML IV SOLN
INTRAVENOUS | Status: DC | PRN
  Administered 2023-10-04: 1000 mg via INTRAVENOUS

## 2023-10-04 MED ORDER — DEXAMETHASONE SODIUM PHOSPHATE 10 MG/ML IJ SOLN
INTRAMUSCULAR | Status: DC | PRN
  Administered 2023-10-04: 10 mg via INTRAVENOUS

## 2023-10-04 MED ORDER — OXYCODONE HCL 5 MG/5ML PO SOLN: 5.0000 mg | Freq: Once | ORAL | Status: DC | PRN

## 2023-10-04 SURGICAL SUPPLY — 26 items
BLADE CLIPPER SURG (BLADE) ×1 IMPLANT
BLADE SURG 15 STRL LF DISP TIS (BLADE) ×1 IMPLANT
BNDG COHESIVE 1X5 TAN NS LF (GAUZE/BANDAGES/DRESSINGS) IMPLANT
CHLORAPREP W/TINT 26 (MISCELLANEOUS) ×1 IMPLANT
DRAPE LAPAROTOMY 77X122 PED (DRAPES) ×1 IMPLANT
ELECT REM PT RETURN 9FT ADLT (ELECTROSURGICAL) ×1
ELECTRODE REM PT RTRN 9FT ADLT (ELECTROSURGICAL) ×1 IMPLANT
GAUZE 4X4 16PLY ~~LOC~~+RFID DBL (SPONGE) ×1 IMPLANT
GAUZE PETROLATUM 1 X8 (GAUZE/BANDAGES/DRESSINGS) ×1 IMPLANT
GAUZE STRETCH 2X75IN STRL (MISCELLANEOUS) ×1 IMPLANT
GLOVE BIO SURGEON STRL SZ 6.5 (GLOVE) ×1 IMPLANT
GOWN STRL REUS W/ TWL LRG LVL3 (GOWN DISPOSABLE) ×2 IMPLANT
KIT TURNOVER KIT A (KITS) ×1 IMPLANT
LABEL OR SOLS (LABEL) ×1 IMPLANT
MANIFOLD NEPTUNE II (INSTRUMENTS) ×1 IMPLANT
NDL HYPO 25X1 1.5 SAFETY (NEEDLE) ×1 IMPLANT
NEEDLE HYPO 25X1 1.5 SAFETY (NEEDLE) ×1 IMPLANT
NS IRRIG 500ML POUR BTL (IV SOLUTION) ×1 IMPLANT
PACK BASIN MINOR ARMC (MISCELLANEOUS) ×1 IMPLANT
SOL PREP PVP 2OZ (MISCELLANEOUS) ×1
SOLUTION PREP PVP 2OZ (MISCELLANEOUS) ×1 IMPLANT
SUT CHROMIC 3 0 PS 2 (SUTURE) ×2 IMPLANT
SUT CHROMIC 3 0 SH 27 (SUTURE) IMPLANT
SYR 10ML LL (SYRINGE) ×1 IMPLANT
TRAP FLUID SMOKE EVACUATOR (MISCELLANEOUS) ×1 IMPLANT
WATER STERILE IRR 500ML POUR (IV SOLUTION) ×1 IMPLANT

## 2023-10-04 NOTE — H&P (Signed)
10/04/23  [facility] CTAB  Marc Stevens [birth year] [id number]   Referring provider: Danelle Berry, PA-C [address] [place],  [place] [address]       Chief Complaint  Patient presents with   Circumcision      Discussion        HPI:   62 year-old male who presents today to discuss circumcision.    He reports experiencing small tears and soreness of his foreskin when attempting to use a condom, which sometimes leads to avoiding sexual activity. He has not tried any creams or salves for the foreskin issue. He is considering circumcision due to these ongoing issues.      PMH:     Past Medical History:  Diagnosis Date   BPH (benign prostatic hypertrophy)     Complete rotator cuff tear 08/08/2015   Diabetes mellitus without complication (HCC)     ED (erectile dysfunction)     HTN (hypertension) 04/18/2012   Hyperlipidemia     Low testosterone     Morbidly obese (HCC)     OSA (obstructive sleep apnea)      cpap - does not know settings    Rotator cuff tear      RT   Torn Achilles tendon 07/11/2015   Torn Achilles tendon 07/11/2015          Surgical History:      Past Surgical History:  Procedure Laterality Date   COLONOSCOPY WITH PROPOFOL N/A 02/18/2018    Procedure: COLONOSCOPY WITH PROPOFOL;  Surgeon: Wyline Mood, MD;  Location: [facility] ENDOSCOPY;  Service: Gastroenterology;  Laterality: N/A;   COLONOSCOPY WITH PROPOFOL N/A 03/05/2023    Procedure: COLONOSCOPY WITH PROPOFOL;  Surgeon: Wyline Mood, MD;  Location: [facility] ENDOSCOPY;  Service: Gastroenterology;  Laterality: N/A;   DOPPLER ECHOCARDIOGRAPHY   2013   left kene arthroscopy        LUMBAR LAMINECTOMY/DECOMPRESSION MICRODISCECTOMY N/A 01/30/2015    Procedure: MICRO LUMBAR DECOMPRESSION, BILATERAL FORAMINOTOMIES, MICRODISCECTOMY LUMBAR FOUR TO LUMBAR FIVE;  Surgeon: Jene Every, MD;  Location: [facility] ORS;  Service: Orthopedics;  Laterality: N/A;   right knee surgery       ROTATOR CUFF REPAIR   2007    Left    SHOULDER OPEN ROTATOR CUFF REPAIR Right 08/08/2015    Procedure: RIGHT SHOULDER MINI OPEN ROTATOR CUFF REPAIR SUBACROMIAL DECOMPRESSION ;  Surgeon: Jene Every, MD;  Location: [facility] ORS;  Service: Orthopedics;  Laterality: Right;          Home Medications:  Allergies as of 08/13/2023   No Known Allergies         Medication List           Accurate as of August 13, 2023 10:45 AM. If you have any questions, ask your nurse or doctor.              amLODipine 2.5 MG tablet Commonly known as: NORVASC TAKE 1 TABLET BY MOUTH EVERY DAY    aspirin EC 81 MG tablet Take 1 tablet (81 mg total) by mouth every morning. Resume 4 days post-op    atorvastatin 10 MG tablet Commonly known as: LIPITOR Take 1 tablet (10 mg total) by mouth daily.    carvedilol 12.5 MG tablet Commonly known as: COREG TAKE 1 TABLET (12.5MG  TOTAL) BY MOUTH TWICE A DAY WITH MEALS    clomiPHENE 50 MG tablet Commonly known as: CLOMID Take 1/2 tablet daily    diclofenac Sodium 1 % Gel Commonly known as: VOLTAREN  gabapentin 600 MG tablet Commonly known as: Neurontin Take 1 tablet (600 mg total) by mouth 3 (three) times daily.    ketoconazole 2 % cream Commonly known as: NIZORAL Apply topically 2 (two) times daily.    losartan-hydrochlorothiazide 100-25 MG tablet Commonly known as: HYZAAR TAKE 1 TABLET BY MOUTH EVERY DAY    metFORMIN 500 MG 24 hr tablet Commonly known as: GLUCOPHAGE-XR TAKE 1 TABLET BY MOUTH EVERY DAY WITH BREAKFAST    Na Sulfate-K Sulfate-Mg Sulf 17.5-3.13-1.6 GM/177ML Soln Take by mouth.    sildenafil 100 MG tablet Commonly known as: VIAGRA TAKE 1 TABLET BY MOUTH DAILY    terbinafine 250 MG tablet Commonly known as: LAMISIL Take 1 tablet (250 mg total) by mouth daily.    VITAMIN D (ERGOCALCIFEROL) PO Take 5,000 Units by mouth daily.               Family History:      Family History  Problem Relation Age of Onset   Dementia Mother     Hypertension Mother      Cancer Father          colon   Heart disease Father     Congestive Heart Failure Father     Hypertension Sister     Dementia Maternal Grandmother     Kidney disease Neg Hx     Prostate cancer Neg Hx     COPD Neg Hx     Diabetes Neg Hx     Stroke Neg Hx     Kidney cancer Neg Hx     Bladder Cancer Neg Hx            Social History:  reports that he quit smoking about 18 years ago. His smoking use included cigarettes. He started smoking about 48 years ago. He has a 15 pack-year smoking history. He has never used smokeless tobacco. He reports current alcohol use of about 4.0 standard drinks of alcohol per week. He reports that he does not use drugs.     Physical Exam: BP (!) 161/89   Pulse 71   Ht 5\' 11"  (1.803 m)   Wt 283 lb 6 oz (128.5 kg)   BMI 39.52 kg/m   Constitutional:  Alert and oriented, No acute distress. HEENT: [place] AT, moist mucus membranes.  Trachea midline, no masses. GU: Uncircumcised phallus, no erythema, small tears noted. No difficulty retracting foreskin Neurologic: Grossly intact, no focal deficits, moving all 4 extremities. Psychiatric: Normal mood and affect.     Assessment & Plan:     1. Phimosis/ [place] - Discussed the use of topical steroid and antifungal creams to manage inflammation and potential eczema-like symptoms of the foreskin.  - He has expressed interest in proceeding with circumcision - He has been informed about the procedure, including the need for sedation, potential risks (bleeding, infection, sensitivity, dissatisfaction with cosmesis), and post-operative care (abstinence from sexual activity for one month, use of bacitracin ointment to prevent stitches from sticking to clothing).  - He will be scheduled for an outpatient circumcision procedure.   Return for circumcision. Follow up 1 month post-procedure to assess healing and address any concerns.   I have reviewed the above documentation for accuracy and completeness, and I agree with  the above.    Vanna Scotland, MD     [facility] [address] [place], [place] [address] [phone]

## 2023-10-04 NOTE — Anesthesia Postprocedure Evaluation (Signed)
Anesthesia Post Note  Patient: Marc Stevens  Procedure(s) Performed: CIRCUMCISION ADULT  Patient location during evaluation: PACU Anesthesia Type: General Level of consciousness: awake and alert Pain management: pain level controlled Vital Signs Assessment: post-procedure vital signs reviewed and stable Respiratory status: spontaneous breathing, nonlabored ventilation and respiratory function stable Cardiovascular status: blood pressure returned to baseline and stable Postop Assessment: no apparent nausea or vomiting Anesthetic complications: no   No notable events documented.   Last Vitals:  Vitals:   10/04/23 1700 10/04/23 1715  BP: 129/84 139/84  Pulse: 63 (!) 58  Resp: 17 11  Temp:  36.4 C  SpO2: 98% 99%    Last Pain:  Vitals:   10/04/23 1715  PainSc: 0-No pain                 Foye Deer

## 2023-10-04 NOTE — Anesthesia Procedure Notes (Signed)
Procedure Name: LMA Insertion Date/Time: 10/04/2023 3:51 PM  Performed by: Elisabeth Pigeon, CRNAPre-anesthesia Checklist: Patient identified, Emergency Drugs available, Suction available and Patient being monitored Patient Re-evaluated:Patient Re-evaluated prior to induction Oxygen Delivery Method: Circle system utilized Preoxygenation: Pre-oxygenation with 100% oxygen Induction Type: IV induction Ventilation: Mask ventilation without difficulty LMA: LMA inserted LMA Size: 4.0 Tube type: Oral Number of attempts: 1 Airway Equipment and Method: Oral airway Placement Confirmation: ETT inserted through vocal cords under direct vision, positive ETCO2 and breath sounds checked- equal and bilateral Tube secured with: Tape Dental Injury: Teeth and Oropharynx as per pre-operative assessment

## 2023-10-04 NOTE — Discharge Instructions (Signed)
Your dressing should stay on for 2 days if possible.  If you have difficulty urinating or the dressing becomes soiled, it can be removed earlier.  No sexual activity until the wound is completely healed.  You may use bacitracin ointment on the sutures to keep them from sticking to your underwear.

## 2023-10-04 NOTE — Op Note (Signed)
Date of procedure: 10/04/23  Preoperative diagnosis:  Phimosis  Postoperative diagnosis:  Same as above  Procedure: Circumcision  Surgeon: Vanna Scotland, MD  Anesthesia: General  Complications: None  Intraoperative findings: Uncomplicated circ  EBL: Minimal  Specimens: None  Drains: None  Indication: Marc Stevens is a 62 y.o. patient with phimosis and tearing of his foreskin with sexual activity.  After reviewing the management options for treatment, he elected to proceed with the above surgical procedure(s). We have discussed the potential benefits and risks of the procedure, side effects of the proposed treatment, the likelihood of the patient achieving the goals of the procedure, and any potential problems that might occur during the procedure or recuperation. Informed consent has been obtained.  Description of procedure:  The patient was taken to the operating room and general anesthesia was induced.  The patient was placed in the supine position, prepped and draped in the usual sterile fashion, and preoperative antibiotics were administered. A preoperative time-out was performed.   10 cc of lidocaine was used for penile block and another 10 cc for ring block.  The incisions were then planned and marked using a marking pen.  The distal marking was approximately 1 cm proximal to the coronal margin.  A second mark was made on the midshaft but enough redundant shaft skin was left to ensure no tethering with erections.  Each of these marks were then incised and the foreskin was removed and a sleevelike fashion.  Careful hemostasis was then achieved using Bovie electrocautery.  The frenulum was reapproximated using a single 3-0 chromic suture.  A U-stitch was placed then using the same suture on the ventral aspect to reapproximate the shaft skin.  Simple interrupted sutures were used around the periphery of the shaft skin using the same 3-0 chromic to reapproximate the skin.   Cosmesis was excellent.  The patient was then cleaned and dried.  A dressing of Vaseline gauze conformer and Coban was applied.  He was then awakened from general anesthesia and taken to the PACU in stable condition.  Plan: Remove dressing in 2 days.  Follow-up in 1 month for wound check.  Vanna Scotland, M.D.

## 2023-10-04 NOTE — Transfer of Care (Signed)
Immediate Anesthesia Transfer of Care Note  Patient: Marc Stevens  Procedure(s) Performed: CIRCUMCISION ADULT  Patient Location: PACU  Anesthesia Type:General  Level of Consciousness: drowsy  Airway & Oxygen Therapy: Patient Spontanous Breathing and Patient connected to face mask oxygen  Post-op Assessment: Report given to RN and Post -op Vital signs reviewed and stable  Post vital signs: Reviewed and stable  Last Vitals:  Vitals Value Taken Time  BP 152/90 10/04/23 1641  Temp 36.3 C 10/04/23 1641  Pulse 65 10/04/23 1644  Resp 15 10/04/23 1644  SpO2 100 % 10/04/23 1644  Vitals shown include unfiled device data.  Last Pain:  Vitals:   10/04/23 1247  PainSc: 0-No pain         Complications: No notable events documented.

## 2023-10-04 NOTE — Anesthesia Preprocedure Evaluation (Signed)
Anesthesia Evaluation  Patient identified by MRN, date of birth, ID band Patient awake    Reviewed: Allergy & Precautions, NPO status , Patient's Chart, lab work & pertinent test results  History of Anesthesia Complications Negative for: history of anesthetic complications  Airway Mallampati: III  TM Distance: >3 FB Neck ROM: Full    Dental no notable dental hx. (+) Teeth Intact   Pulmonary sleep apnea and Continuous Positive Airway Pressure Ventilation , neg COPD, Patient abstained from smoking.Not current smoker, former smoker   Pulmonary exam normal breath sounds clear to auscultation       Cardiovascular Exercise Tolerance: Good METShypertension, Pt. on medications (-) CAD and (-) Past MI (-) dysrhythmias  Rhythm:Regular Rate:Normal - Systolic murmurs    Neuro/Psych negative neurological ROS  negative psych ROS   GI/Hepatic ,neg GERD  ,,(+)     (-) substance abuse    Endo/Other  diabetes, Well Controlled    Renal/GU negative Renal ROS     Musculoskeletal   Abdominal  (+) + obese  Peds  Hematology   Anesthesia Other Findings Past Medical History: No date: BPH (benign prostatic hypertrophy) 08/08/2015: Complete rotator cuff tear No date: Diabetes mellitus without complication (HCC) No date: ED (erectile dysfunction) 04/18/2012: HTN (hypertension) No date: Hyperlipidemia No date: Low testosterone No date: LVH (left ventricular hypertrophy) No date: Morbidly obese (HCC) No date: OSA (obstructive sleep apnea)     Comment:  cpap - does not know settings  No date: Pre-diabetes No date: Rotator cuff tear     Comment:  RT 07/11/2015: Torn Achilles tendon 07/11/2015: Torn Achilles tendon  Reproductive/Obstetrics                             Anesthesia Physical Anesthesia Plan  ASA: 2  Anesthesia Plan: General   Post-op Pain Management: Ofirmev IV (intra-op)*   Induction:  Intravenous  PONV Risk Score and Plan: 3 and Ondansetron, Dexamethasone and Midazolam  Airway Management Planned: LMA  Additional Equipment: None  Intra-op Plan:   Post-operative Plan: Extubation in OR  Informed Consent: I have reviewed the patients History and Physical, chart, labs and discussed the procedure including the risks, benefits and alternatives for the proposed anesthesia with the patient or authorized representative who has indicated his/her understanding and acceptance.     Dental advisory given  Plan Discussed with: CRNA and Surgeon  Anesthesia Plan Comments: (Discussed risks of anesthesia with patient, including PONV, sore throat, lip/dental/eye damage. Rare risks discussed as well, such as cardiorespiratory and neurological sequelae, and allergic reactions. Discussed the role of CRNA in patient's perioperative care. Patient understands.)       Anesthesia Quick Evaluation

## 2023-10-05 ENCOUNTER — Encounter: Payer: Self-pay | Admitting: Urology

## 2023-10-21 ENCOUNTER — Encounter: Payer: Self-pay | Admitting: Physician Assistant

## 2023-10-21 ENCOUNTER — Encounter: Payer: Self-pay | Admitting: Urology

## 2023-10-21 NOTE — Telephone Encounter (Signed)
 Spoke with patient on the phone and rescheduled appointment.

## 2023-10-29 ENCOUNTER — Ambulatory Visit (INDEPENDENT_AMBULATORY_CARE_PROVIDER_SITE_OTHER): Payer: BC Managed Care – PPO | Admitting: Physician Assistant

## 2023-10-29 ENCOUNTER — Encounter: Payer: Self-pay | Admitting: Physician Assistant

## 2023-10-29 ENCOUNTER — Telehealth: Payer: Self-pay | Admitting: Podiatry

## 2023-10-29 VITALS — BP 140/82 | HR 71 | Resp 16 | Ht 71.0 in | Wt 288.0 lb

## 2023-10-29 DIAGNOSIS — I10 Essential (primary) hypertension: Secondary | ICD-10-CM

## 2023-10-29 MED ORDER — VALSARTAN-HYDROCHLOROTHIAZIDE 320-25 MG PO TABS: 1.0000 | ORAL_TABLET | Freq: Every day | ORAL | 0 refills | Status: DC

## 2023-10-29 NOTE — Progress Notes (Signed)
 Established Patient Office Visit  Name: Marc Stevens   MRN: [id number]    DOB: 04-Aug-1961   Date:10/29/2023  Today's Provider: [place], MHS, PA-C Introduced myself to the patient as a PA-C and provided education on APPs in clinical practice.         Subjective  Chief Complaint  Chief Complaint  Patient presents with   Hypertension    Would like to lower bp. It has been in the 150's/80's    HPI   Hypertension: - Medications: Losartan -HCtZ 100-25 mg PO every day, Carvedilol  6.25 mg PO BID, Amlodipine  10 mg PO every day  - Compliance: excellent  - Checking BP at home: he has been checking at home daily with an arm cuff.  Has log on phone, reviewed with him in apt. His avg has been 140s-150s/ 80s    He spoke with Cardiology in Dec - they were asking if patient would be comfortable changing Losartan  to Valsartan  but pt wished to speak to PCP team first   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   Pre-diabetes 08/13/2017   Mitral regurgitation 01/17/2016   LVH (left ventricular hypertrophy) 01/01/2016   Low testosterone  05/14/2015   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   Vitamin D  deficiency 04/26/2015   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   Class 2 severe obesity with serious comorbidity and body mass index (BMI) of 37.0 to 37.9 in adult [facility])    ED (erectile dysfunction)    Hyperlipidemia    OSA (obstructive sleep apnea)    HTN (hypertension) 04/18/2012    Past Surgical History:  Procedure Laterality Date   CIRCUMCISION N/A 10/04/2023   Procedure: CIRCUMCISION ADULT;   Surgeon: Penne Knee, MD;  Location: [facility] ORS;  Service: Urology;  Laterality: N/A;   COLONOSCOPY WITH PROPOFOL  N/A 02/18/2018   Procedure: COLONOSCOPY WITH PROPOFOL ;  Surgeon: Therisa Bi, MD;  Location: [facility] ENDOSCOPY;  Service: Gastroenterology;  Laterality: N/A;   COLONOSCOPY WITH PROPOFOL  N/A 03/05/2023   Procedure: COLONOSCOPY WITH PROPOFOL ;  Surgeon: Therisa Bi, MD;  Location: [facility] ENDOSCOPY;  Service: Gastroenterology;  Laterality: N/A;   DOPPLER ECHOCARDIOGRAPHY  2013   left kene arthroscopy      LUMBAR LAMINECTOMY/DECOMPRESSION MICRODISCECTOMY N/A 01/30/2015   Procedure: MICRO LUMBAR DECOMPRESSION, BILATERAL FORAMINOTOMIES, MICRODISCECTOMY LUMBAR FOUR TO LUMBAR FIVE;  Surgeon: Reyes Billing, MD;  Location: [facility] ORS;  Service: Orthopedics;  Laterality: N/A;   right knee surgery     ROTATOR CUFF REPAIR  2007   Left   SHOULDER OPEN ROTATOR CUFF REPAIR Right 08/08/2015   Procedure: RIGHT SHOULDER MINI OPEN ROTATOR CUFF REPAIR SUBACROMIAL DECOMPRESSION ;  Surgeon: Reyes Billing, MD;  Location: [facility] ORS;  Service: Orthopedics;  Laterality: Right;    Family History  Problem Relation Age of Onset   Dementia Mother    Hypertension Mother    Cancer Father        colon   Heart disease Father    Congestive Heart Failure Father    Hypertension Sister    Dementia Maternal Grandmother    Kidney disease Neg Hx    Prostate cancer Neg Hx  COPD Neg Hx    Diabetes Neg Hx    Stroke Neg Hx    Kidney cancer Neg Hx    Bladder Cancer Neg Hx     Social History   Tobacco Use   Smoking status: Former    Current packs/day: 0.00    Average packs/day: 0.5 packs/day for 30.0 years (15.0 ttl pk-yrs)    Types: Cigarettes    Start date: 10/19/1974    Quit date: 10/19/2004    Years since quitting: 19.0   Smokeless tobacco: Never  Substance Use Topics   Alcohol use: Yes    Alcohol/week: 4.0 standard drinks of alcohol    Types: 4 Shots of liquor per week    Comment: occasional     Current  Outpatient Medications:    amLODipine  (NORVASC ) 10 MG tablet, Take 10 mg by mouth daily., Disp: , Rfl:    aspirin  EC 81 MG tablet, Take 1 tablet (81 mg total) by mouth every morning. Resume 4 days post-op, Disp: , Rfl:    atorvastatin  (LIPITOR) 10 MG tablet, Take 1 tablet (10 mg total) by mouth daily., Disp: 90 tablet, Rfl: 3   carvedilol  (COREG ) 12.5 MG tablet, Take 0.5 tablets (6.25 mg total) by mouth 2 (two) times daily with a meal., Disp: , Rfl:    Cholecalciferol (VITAMIN D -3) 125 MCG (5000 UT) TABS, Take 5,000 Units by mouth daily., Disp: , Rfl:    gabapentin  (NEURONTIN ) 600 MG tablet, Take 1 tablet (600 mg total) by mouth 3 (three) times daily., Disp: 270 tablet, Rfl: 3   HYDROcodone -acetaminophen  (NORCO/VICODIN) 5-325 MG tablet, Take 1-2 tablets by mouth every 6 (six) hours as needed for moderate pain (pain score 4-6)., Disp: 10 tablet, Rfl: 0   ketoconazole  (NIZORAL ) 2 % cream, Apply topically 2 (two) times daily., Disp: 30 g, Rfl: 1   Multiple Vitamins-Minerals (CENTRUM SILVER 50+MEN) TABS, Take 1 tablet by mouth daily., Disp: , Rfl:    sildenafil  (VIAGRA ) 100 MG tablet, TAKE 1 TABLET BY MOUTH DAILY, Disp: 30 tablet, Rfl: 3   valsartan -hydrochlorothiazide  (DIOVAN -HCT) 320-25 MG tablet, Take 1 tablet by mouth daily., Disp: 90 tablet, Rfl: 0   clomiPHENE  (CLOMID ) 50 MG tablet, Take 1/2 tablet daily (Patient not taking: Reported on 09/21/2023), Disp: 30 tablet, Rfl: 0   Na Sulfate-K Sulfate-Mg Sulf 17.5-3.13-1.6 GM/177ML SOLN, Take by mouth. (Patient not taking: Reported on 09/27/2023), Disp: , Rfl:   No Known Allergies  I personally reviewed active problem list, medication list, allergies, notes from last encounter, lab results with the patient/caregiver today.   Review of Systems  Eyes:  Negative for blurred vision and double vision.  Respiratory:  Positive for shortness of breath.   Cardiovascular:  Negative for chest pain, palpitations and leg swelling.  Neurological:  Negative for  dizziness and headaches.      Objective  Vitals:   10/29/23 0819  BP: (!) 140/82  Pulse: 71  Resp: 16  SpO2: 96%  Weight: 288 lb (130.6 kg)  Height: 5' 11 (1.803 m)    Body mass index is 40.17 kg/m.  Physical Exam Vitals reviewed.  Constitutional:      General: He is awake.     Appearance: Normal appearance. He is well-developed and well-groomed.  HENT:     Head: Normocephalic and atraumatic.  Eyes:     Extraocular Movements: Extraocular movements intact.     Conjunctiva/sclera: Conjunctivae normal.  Pulmonary:     Effort: Pulmonary effort is normal.  Musculoskeletal:     Cervical  back: Normal range of motion.  Neurological:     General: No focal deficit present.     Mental Status: He is alert and oriented to person, place, and time.     GCS: GCS eye subscore is 4. GCS verbal subscore is 5. GCS motor subscore is 6.  Psychiatric:        Attention and Perception: Attention normal.        Mood and Affect: Mood normal.        Speech: Speech normal.        Behavior: Behavior normal. Behavior is cooperative.      Recent Results (from the past 2160 hours)  COMPLETE METABOLIC PANEL WITH GFR     Status: Abnormal   Collection Time: 08/24/23  9:20 AM  Result Value Ref Range   Glucose, Bld 102 (H) 65 - 99 mg/dL    Comment: .            Fasting reference interval . For someone without known diabetes, a glucose value between 100 and 125 mg/dL is consistent with prediabetes and should be confirmed with a follow-up test. .    BUN 19 7 - 25 mg/dL   Creat 8.90 9.29 - 8.64 mg/dL   eGFR 77 > OR = 60 fO/fpw/8.26f7   BUN/Creatinine Ratio SEE NOTE: 6 - 22 (calc)    Comment:    Not Reported: BUN and Creatinine are within    reference range. .    Sodium 144 135 - 146 mmol/L   Potassium 4.4 3.5 - 5.3 mmol/L   Chloride 105 98 - 110 mmol/L   CO2 32 20 - 32 mmol/L   Calcium  10.1 8.6 - 10.3 mg/dL   Total Protein 6.4 6.1 - 8.1 g/dL   Albumin 4.0 3.6 - 5.1 g/dL   Globulin  2.4 1.9 - 3.7 g/dL (calc)   AG Ratio 1.7 1.0 - 2.5 (calc)   Total Bilirubin 0.3 0.2 - 1.2 mg/dL   Alkaline phosphatase (APISO) 84 35 - 144 U/L   AST 24 10 - 35 U/L   ALT 29 9 - 46 U/L  CBC w/Diff/Platelet     Status: Abnormal   Collection Time: 08/24/23  9:20 AM  Result Value Ref Range   WBC 4.3 3.8 - 10.8 Thousand/uL   RBC 7.06 (H) 4.20 - 5.80 Million/uL   Hemoglobin 14.2 13.2 - 17.1 g/dL   HCT 53.5 61.4 - 49.9 %   MCV 65.7 (L) 80.0 - 100.0 fL   MCH 20.1 (L) 27.0 - 33.0 pg   MCHC 30.6 (L) 32.0 - 36.0 g/dL    Comment: For adults, a slight decrease in the calculated MCHC value (in the range of 30 to 32 g/dL) is most likely not clinically significant; however, it should be interpreted with caution in correlation with other red cell parameters and the patient's clinical condition.    RDW 18.4 (H) 11.0 - 15.0 %   Platelets 280 140 - 400 Thousand/uL   MPV 9.7 7.5 - 12.5 fL   Neutro Abs 2,060 1,500 - 7,800 cells/uL   Absolute Lymphocytes 1,445 850 - 3,900 cells/uL   Absolute Monocytes 576 200 - 950 cells/uL   Eosinophils Absolute 181 15 - 500 cells/uL   Basophils Absolute 39 0 - 200 cells/uL   Neutrophils Relative % 47.9 %   Total Lymphocyte 33.6 %   Monocytes Relative 13.4 %   Eosinophils Relative 4.2 %   Basophils Relative 0.9 %   Smear Review  Comment: . Red cell morphology appears unremarkable . Review of peripheral smear confirms automated results.   PSA     Status: None   Collection Time: 08/24/23  9:20 AM  Result Value Ref Range   PSA 0.87 < OR = 4.00 ng/mL    Comment: The total PSA value from this assay system is  standardized against the WHO standard. The test  result will be approximately 20% lower when compared  to the equimolar-standardized total PSA (Beckman  Coulter). Comparison of serial PSA results should be  interpreted with this fact in mind. . This test was performed using the Siemens  chemiluminescent method. Values obtained from  different  assay methods cannot be used interchangeably. PSA levels, regardless of value, should not be interpreted as absolute evidence of the presence or absence of disease.   Lipid Profile     Status: None   Collection Time: 08/24/23  9:20 AM  Result Value Ref Range   Cholesterol 125 <200 mg/dL   HDL 52 > OR = 40 mg/dL   Triglycerides 87 <849 mg/dL   LDL Cholesterol (Calc) 56 mg/dL (calc)    Comment: Reference range: <100 . Desirable range <100 mg/dL for primary prevention;   <70 mg/dL for patients with CHD or diabetic patients  with > or = 2 CHD risk factors. SABRA LDL-C is now calculated using the Martin-Hopkins  calculation, which is a validated novel method providing  better accuracy than the Friedewald equation in the  estimation of LDL-C.  Gladis APPLETHWAITE et al. SANDREA. 7986;689(80): 2061-2068  (http://education.[facility].com/faq/FAQ164)    Total CHOL/HDL Ratio 2.4 <5.0 (calc)   Non-HDL Cholesterol (Calc) 73 <869 mg/dL (calc)    Comment: For patients with diabetes plus 1 major ASCVD risk  factor, treating to a non-HDL-C goal of <100 mg/dL  (LDL-C of <29 mg/dL) is considered a therapeutic  option.   Vitamin D  (25 hydroxy)     Status: None   Collection Time: 08/24/23  9:20 AM  Result Value Ref Range   Vit D, 25-Hydroxy 57 30 - 100 ng/mL    Comment: Vitamin D  Status         25-OH Vitamin D : . Deficiency:                    <20 ng/mL Insufficiency:             20 - 29 ng/mL Optimal:                 > or = 30 ng/mL . For 25-OH Vitamin D  testing on patients on  D2-supplementation and patients for whom quantitation  of D2 and D3 fractions is required, the QuestAssureD(TM) 25-OH VIT D, (D2,D3), LC/MS/MS is recommended: order  code 07111 (patients >72yrs). . See Note 1 . Note 1 . For additional information, please refer to  http://education.[facility].com/faq/FAQ199  (This link is being provided for informational/ educational purposes only.)   HM DIABETES EYE EXAM      Status: None   Collection Time: 09/23/23  9:55 AM  Result Value Ref Range   HM Diabetic Eye Exam No Retinopathy No Retinopathy    Comment: ABSTRACTED BY HIM     PHQ2/9:    08/24/2023    8:27 AM 04/09/2023    9:13 AM 01/01/2023    9:47 AM 10/02/2022    8:17 AM 10/02/2022    8:09 AM  Depression screen PHQ 2/9  Decreased Interest 1 0 0 1 0  Down, Depressed, Hopeless 0  0 0 0 0  PHQ - 2 Score 1 0 0 1 0  Altered sleeping 0 0 0 0 0  Tired, decreased energy 0 0 0 0 0  Change in appetite 0 0 0 0 0  Feeling bad or failure about yourself  0 0 0 0 0  Trouble concentrating 0 0 0 0 0  Moving slowly or fidgety/restless 0 0 0 0 0  Suicidal thoughts 0 0 0 0 0  PHQ-9 Score 1 0 0 1 0  Difficult doing work/chores Not difficult at all Not difficult at all Not difficult at all Not difficult at all Not difficult at all      Fall Risk:    10/29/2023    8:18 AM 08/24/2023    8:26 AM 04/09/2023    9:12 AM 01/01/2023    9:47 AM 10/02/2022    8:17 AM  Fall Risk   Falls in the past year? 0 0 0 0 0  Number falls in past yr: 0 0 0 0 0  Injury with Fall? 0 0 0 0 0  Risk for fall due to : No Fall Risks No Fall Risks No Fall Risks No Fall Risks   Follow up Falls prevention discussed Falls prevention discussed;Education provided;Falls evaluation completed Falls prevention discussed;Education provided;Falls evaluation completed Falls prevention discussed;Education provided;Falls evaluation completed       Functional Status Survey:      Assessment & Plan  Problem List Items Addressed This Visit       Cardiovascular and Mediastinum   HTN (hypertension) - Primary (Chronic)   Chronic, historic condition Patient is currently taking losartan -hydrochlorothiazide  100-25 mg p.o. daily, carvedilol  6.25 mg p.o. twice daily, amlodipine  10 mg p.o. daily. He reports continued blood pressure elevations at home, in the 140s to 150s over 80s. Reviewed his most recent interactions with cardiology office.  They  have suggested changing losartan  to valsartan .  He is amenable to making this change today.  Will discontinue losartan -HCTZ combo medication. Start valsartan -HCTZ 320-25 mg p.o. daily.  Continue amlodipine  and carvedilol  Reviewed potential secondary causes of hypertension with patient.  Reviewed that if his blood pressure is not improving in the next 2 weeks we will refer him to advanced hypertension clinic for further evaluation. Follow-up in 2 weeks or sooner if concerns arise      Relevant Medications   valsartan -hydrochlorothiazide  (DIOVAN -HCT) 320-25 MG tablet     Return in about 2 weeks (around 11/12/2023) for HTN.   I, Lucifer Soja E Donne Robillard, PA-C, have reviewed all documentation for this visit. The documentation on 10/29/23 for the exam, diagnosis, procedures, and orders are all accurate and complete.   [place], MHS, PA-C [facility]

## 2023-10-29 NOTE — Assessment & Plan Note (Signed)
 Chronic, historic condition Patient is currently taking losartan -hydrochlorothiazide  100-25 mg p.o. daily, carvedilol  6.25 mg p.o. twice daily, amlodipine  10 mg p.o. daily. He reports continued blood pressure elevations at home, in the 140s to 150s over 80s. Reviewed his most recent interactions with cardiology office.  They have suggested changing losartan  to valsartan .  He is amenable to making this change today.  Will discontinue losartan -HCTZ combo medication. Start valsartan -HCTZ 320-25 mg p.o. daily.  Continue amlodipine  and carvedilol  Reviewed potential secondary causes of hypertension with patient.  Reviewed that if his blood pressure is not improving in the next 2 weeks we will refer him to advanced hypertension clinic for further evaluation. Follow-up in 2 weeks or sooner if concerns arise

## 2023-10-29 NOTE — Telephone Encounter (Signed)
 Patient needs refill on gabapentin sent to [facility] in [place], [address]  States he always just calls the office to get a refill.

## 2023-10-29 NOTE — Patient Instructions (Signed)
 Please continue to take Carvedilol  6.25 mg by mouth twice per day   We are changing your combo medication from Losartan - hydrochlorothiazide  to Valsartan - hydrochlorothiazide . STOP the Losartan -HCtZ and START the VALSARTAN -HCtZ   Continue to take the Amlodipine  10 mg once per day  Continue to check your blood pressure daily  We will see you back in 2 weeks to check your BP progress and discuss next steps.

## 2023-11-09 ENCOUNTER — Ambulatory Visit: Payer: BC Managed Care – PPO | Admitting: Urology

## 2023-11-12 ENCOUNTER — Ambulatory Visit: Payer: BC Managed Care – PPO | Admitting: Family Medicine

## 2023-11-12 ENCOUNTER — Encounter: Payer: Self-pay | Admitting: Family Medicine

## 2023-11-12 ENCOUNTER — Ambulatory Visit (INDEPENDENT_AMBULATORY_CARE_PROVIDER_SITE_OTHER): Payer: BC Managed Care – PPO | Admitting: Urology

## 2023-11-12 VITALS — BP 128/72 | HR 86 | Resp 16 | Ht 71.0 in | Wt 288.0 lb

## 2023-11-12 VITALS — BP 108/63 | HR 79 | Ht 71.0 in | Wt 290.1 lb

## 2023-11-12 DIAGNOSIS — E66813 Obesity, class 3: Secondary | ICD-10-CM | POA: Diagnosis not present

## 2023-11-12 DIAGNOSIS — R7303 Prediabetes: Secondary | ICD-10-CM

## 2023-11-12 DIAGNOSIS — R7989 Other specified abnormal findings of blood chemistry: Secondary | ICD-10-CM

## 2023-11-12 DIAGNOSIS — Z6841 Body Mass Index (BMI) 40.0 and over, adult: Secondary | ICD-10-CM

## 2023-11-12 DIAGNOSIS — E782 Mixed hyperlipidemia: Secondary | ICD-10-CM

## 2023-11-12 DIAGNOSIS — I10 Essential (primary) hypertension: Secondary | ICD-10-CM

## 2023-11-12 DIAGNOSIS — Z48816 Encounter for surgical aftercare following surgery on the genitourinary system: Secondary | ICD-10-CM

## 2023-11-12 MED ORDER — CARVEDILOL 6.25 MG PO TABS: 3.1250 mg | ORAL_TABLET | Freq: Two times a day (BID) | ORAL | Status: DC

## 2023-11-12 NOTE — Assessment & Plan Note (Signed)
Multiple med changes and here for 2 week BP recheck Now on valsartan-hydrochlorothiazide 320-25, amlodipine 10, coreg 6.25 BID BP is well controlled today, he got a new BP cuff and home readings are 130-140's, will have him bring in cuff to compare to manual BP His new cardiologist was increasing med doses of amlodipine and changing ARB in attempt to get him off carvedilol to overall decrease pill burden per pt preference So far he is doing well with all med changes and does not note any concerning SE since increasng amlodipine or changing to valsartan Working on low salt diet BP Readings from Last 3 Encounters:  11/12/23 128/72  10/29/23 (!) 140/82  10/04/23 139/84   He will decrease carvedilol to 3.125 MG BID and then fup in office again for BP recheck with cuff before seeing if he can d/c carvedilol

## 2023-11-12 NOTE — Assessment & Plan Note (Addendum)
Pt with highest A1C 6.4% in chart lab hx, but he was on metformin for years (so likely T2DM) He has been off metformin for a few months Recheck A1C today He is working on diet/lifestyle efforts Interested in GLP-1, though not covered by insurance for obesity/weight Lab Results  Component Value Date   HGBA1C 6.1 (H) 04/09/2023   HGBA1C 5.7 (A) 10/02/2022   HGBA1C 6.1 (H) 12/25/2021   HGBA1C 5.9 (H) 02/14/2021   HGBA1C 5.9 (H) 09/06/2020   HGBA1C 5.8 (H) 06/28/2020   GLP-1 would be a great choice if we could get it for him

## 2023-11-12 NOTE — Assessment & Plan Note (Signed)
Stable, well controlled on current statin dose Recent labs reviewed Lab Results  Component Value Date   CHOL 125 08/24/2023   HDL 52 08/24/2023   LDLCALC 56 08/24/2023   TRIG 87 08/24/2023   CHOLHDL 2.4 08/24/2023

## 2023-11-12 NOTE — Assessment & Plan Note (Signed)
Managed by urology

## 2023-11-12 NOTE — Progress Notes (Signed)
Name: Marc Stevens   MRN: [id number]    DOB: [birth year]   Date:11/12/2023       Progress Note  Chief Complaint  Patient presents with   Hypertension    2 week follow-up     Subjective:   Marc Stevens is a 63 y.o. male, presents to clinic for   Hypertension:  Currently managed on changed to valsartan-hydrochlorothiazide from losartan, amlodipine dose has been increased to 10 and he is gradually reducing his carvedilol per cardiology (goal to be on less meds) Pt reports good med compliance and denies any SE.   Blood pressure today is well controlled. BP Readings from Last 3 Encounters:  11/12/23 128/72  10/29/23 (!) 140/82  10/04/23 139/84   Pt denies CP, SOB, exertional sx, LE edema, palpitation, Ha's, visual disturbances, lightheadedness, hypotension, syncope. Dietary efforts for BP?  Working on low salt/diet/exercise   Prediabetes highest A1c 6.4%, not on meds - but he asks about GLP-1's and nutrittionist/dieticians weight loss/food programs, extensive discussion regarding diet/lifestyle efforts and different programs and resources - [employer], [employer] etc   ED/LUTS, on clomid for years with urology but off for several months He is feeling tired, low sex drive, ED      Current Outpatient Medications:    amLODipine (NORVASC) 10 MG tablet, Take 10 mg by mouth daily., Disp: , Rfl:    aspirin EC 81 MG tablet, Take 1 tablet (81 mg total) by mouth every morning. Resume 4 days post-op, Disp: , Rfl:    atorvastatin (LIPITOR) 10 MG tablet, Take 1 tablet (10 mg total) by mouth daily., Disp: 90 tablet, Rfl: 3   carvedilol (COREG) 12.5 MG tablet, Take 0.5 tablets (6.25 mg total) by mouth 2 (two) times daily with a meal., Disp: , Rfl:    Cholecalciferol (VITAMIN D-3) 125 MCG (5000 UT) TABS, Take 5,000 Units by mouth daily., Disp: , Rfl:    gabapentin (NEURONTIN) 600 MG tablet, Take 1 tablet (600 mg total) by mouth 3 (three) times daily., Disp: 270 tablet, Rfl: 3    HYDROcodone-acetaminophen (NORCO/VICODIN) 5-325 MG tablet, Take 1-2 tablets by mouth every 6 (six) hours as needed for moderate pain (pain score 4-6)., Disp: 10 tablet, Rfl: 0   ketoconazole (NIZORAL) 2 % cream, Apply topically 2 (two) times daily., Disp: 30 g, Rfl: 1   Multiple Vitamins-Minerals (CENTRUM SILVER 50+MEN) TABS, Take 1 tablet by mouth daily., Disp: , Rfl:    sildenafil (VIAGRA) 100 MG tablet, TAKE 1 TABLET BY MOUTH DAILY, Disp: 30 tablet, Rfl: 3   valsartan-hydrochlorothiazide (DIOVAN-HCT) 320-25 MG tablet, Take 1 tablet by mouth daily., Disp: 90 tablet, Rfl: 0   clomiPHENE (CLOMID) 50 MG tablet, Take 1/2 tablet daily (Patient not taking: Reported on 09/21/2023), Disp: 30 tablet, Rfl: 0   Na Sulfate-K Sulfate-Mg Sulf 17.5-3.13-1.6 GM/177ML SOLN, Take by mouth. 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   Vitamin D deficiency 04/26/2015   Beta-thalassemia (  HCC) 04/26/2015   Class 2 severe obesity with serious comorbidity and body mass index (BMI) of 37.0 to 37.9 in adult [facility])    ED (erectile dysfunction)    Hyperlipidemia    OSA (obstructive sleep apnea)    HTN (hypertension) 04/18/2012    Past Surgical History:  Procedure Laterality Date   CIRCUMCISION N/A 10/04/2023   Procedure: CIRCUMCISION ADULT;  Surgeon: Vanna Scotland, MD;  Location: [facility] ORS;  Service:  Urology;  Laterality: N/A;   COLONOSCOPY WITH PROPOFOL N/A 02/18/2018   Procedure: COLONOSCOPY WITH PROPOFOL;  Surgeon: Wyline Mood, MD;  Location: [facility] ENDOSCOPY;  Service: Gastroenterology;  Laterality: N/A;   COLONOSCOPY WITH PROPOFOL N/A 03/05/2023   Procedure: COLONOSCOPY WITH PROPOFOL;  Surgeon: Wyline Mood, MD;  Location: [facility] ENDOSCOPY;  Service: Gastroenterology;  Laterality: N/A;   DOPPLER ECHOCARDIOGRAPHY  2013   left kene arthroscopy      LUMBAR LAMINECTOMY/DECOMPRESSION MICRODISCECTOMY N/A 01/30/2015   Procedure: MICRO LUMBAR DECOMPRESSION, BILATERAL FORAMINOTOMIES, MICRODISCECTOMY LUMBAR FOUR TO LUMBAR FIVE;  Surgeon: Jene Every, MD;  Location: [facility] ORS;  Service: Orthopedics;  Laterality: N/A;   right knee surgery     ROTATOR CUFF REPAIR  2007   Left   SHOULDER OPEN ROTATOR CUFF REPAIR Right 08/08/2015   Procedure: RIGHT SHOULDER MINI OPEN ROTATOR CUFF REPAIR SUBACROMIAL DECOMPRESSION ;  Surgeon: Jene Every, MD;  Location: [facility] ORS;  Service: Orthopedics;  Laterality: Right;    Family History  Problem Relation Age of Onset   Dementia Mother    Hypertension Mother    Cancer Father        colon   Heart disease Father    Congestive Heart Failure Father    Hypertension Sister    Dementia Maternal Grandmother    Kidney disease Neg Hx    Prostate cancer Neg Hx    COPD Neg Hx    Diabetes Neg Hx    Stroke Neg Hx    Kidney cancer Neg Hx    Bladder Cancer Neg Hx     Social History   Tobacco Use   Smoking status: Former    Current packs/day: 0.00    Average packs/day: 0.5 packs/day for 30.0 years (15.0 ttl pk-yrs)    Types: Cigarettes    Start date: 10/19/1974    Quit date: 10/19/2004    Years since quitting: 19.0   Smokeless tobacco: Never  Vaping Use   Vaping status: Never Used  Substance Use Topics   Alcohol use: Yes    Alcohol/week: 4.0 standard drinks of alcohol    Types: 4 Shots of liquor per week    Comment: occasional   Drug use: No     No Known  Allergies  Health Maintenance  Topic Date Due   Pneumococcal Vaccine 57-39 Years old (1 of 2 - PCV) Never done   COVID-19 Vaccine (3 - 2024-25 season) 06/20/2023   HEMOGLOBIN A1C  10/09/2023   Zoster Vaccines- Shingrix (1 of 2) 11/24/2023 (Originally 02/11/2012)   INFLUENZA VACCINE  01/17/2024 (Originally 05/20/2023)   Diabetic kidney evaluation - Urine ACR  04/08/2024   FOOT EXAM  04/08/2024   Diabetic kidney evaluation - eGFR measurement  08/23/2024   OPHTHALMOLOGY EXAM  09/22/2024   DTaP/Tdap/Td (4 - Td or Tdap) 11/14/2027   Colonoscopy  03/04/2030   Hepatitis C Screening  Completed   HIV Screening  Completed   HPV VACCINES  Aged Out    Chart Review Today: I personally reviewed active problem list, medication list, allergies, family history,  social history, health maintenance, notes from last encounter, lab results, imaging with the patient/caregiver today.   Review of Systems  Constitutional: Negative.   HENT: Negative.    Eyes: Negative.   Respiratory: Negative.    Cardiovascular: Negative.   Gastrointestinal: Negative.   Endocrine: Negative.   Genitourinary: Negative.   Musculoskeletal: Negative.   Skin: Negative.   Allergic/Immunologic: Negative.   Neurological: Negative.   Hematological: Negative.   Psychiatric/Behavioral: Negative.    All other systems reviewed and are negative.    Objective:   Vitals:   11/12/23 0857  BP: 128/72  Pulse: 86  Resp: 16  SpO2: 99%  Weight: 288 lb (130.6 kg)  Height: 5\' 11"  (1.803 m)    Body mass index is 40.17 kg/m.  Physical Exam Vitals and nursing note reviewed.  Constitutional:      General: He is not in acute distress.    Appearance: Normal appearance. He is well-developed. He is obese. He is not ill-appearing, toxic-appearing or diaphoretic.  HENT:     Head: Normocephalic and atraumatic.     Nose: Nose normal.  Eyes:     General:        Right eye: No discharge.        Left eye: No discharge.      Conjunctiva/sclera: Conjunctivae normal.  Neck:     Trachea: No tracheal deviation.  Cardiovascular:     Rate and Rhythm: Normal rate and regular rhythm.     Pulses: Normal pulses.     Heart sounds: Normal heart sounds. No murmur heard.    No friction rub. No gallop.  Pulmonary:     Effort: Pulmonary effort is normal. No respiratory distress.     Breath sounds: Normal breath sounds. No stridor.  Skin:    General: Skin is warm and dry.     Findings: No rash.  Neurological:     Mental Status: He is alert.     Motor: No abnormal muscle tone.     Coordination: Coordination normal.  Psychiatric:        Mood and Affect: Mood normal.        Behavior: Behavior normal.         Assessment & Plan:    Primary hypertension Assessment & Plan: Multiple med changes and here for 2 week BP recheck Now on valsartan-hydrochlorothiazide 320-25, amlodipine 10, coreg 6.25 BID BP is well controlled today, he got a new BP cuff and home readings are 130-140's, will have him bring in cuff to compare to manual BP His new cardiologist was increasing med doses of amlodipine and changing ARB in attempt to get him off carvedilol to overall decrease pill burden per pt preference So far he is doing well with all med changes and does not note any concerning SE since increasng amlodipine or changing to valsartan Working on low salt diet BP Readings from Last 3 Encounters:  11/12/23 128/72  10/29/23 (!) 140/82  10/04/23 139/84   He will decrease carvedilol to 3.125 MG BID and then fup in office again for BP recheck with cuff before seeing if he can d/c carvedilol    Orders: -     Carvedilol; Take 0.5 tablets (3.125 mg total) by mouth 2 (two) times daily with a meal.  Class 3 severe obesity with serious comorbidity and body mass index (BMI) of 40.0 to 44.9 in adult, unspecified obesity type [facility]) Assessment & Plan: With multiple associated comorbid conditions including HTN, HLD, prediabetes, OSA,  LVH Wt  Readings from Last 5 Encounters:  11/12/23 288 lb (130.6 kg)  10/29/23 288 lb (130.6 kg)  09/27/23 282 lb (127.9 kg)  08/24/23 287 lb (130.2 kg)  08/13/23 283 lb 6 oz (128.5 kg)   BMI Readings from Last 5 Encounters:  11/12/23 40.17 kg/m  10/29/23 40.17 kg/m  09/27/23 39.33 kg/m  08/24/23 40.03 kg/m  08/13/23 39.52 kg/m   He is going to look into wellness programs at work, discussed net negative calories, healthier foods, decreasing simple sugars and carbs and increasing portions of fruit/vegetables lean proteins and some systematic approach to nutrition education and self monitoring of nutrition/calorie intake  - weight watchers, [employer] etc  Orders: -     Hemoglobin A1c  Pre-diabetes Assessment & Plan: Pt with highest A1C 6.4% in chart lab hx, but he was on metformin for years (so likely T2DM) He has been off metformin for a few months Recheck A1C today He is working on diet/lifestyle efforts Interested in GLP-1, though not covered by insurance for obesity/weight Lab Results  Component Value Date   HGBA1C 6.1 (H) 04/09/2023   HGBA1C 5.7 (A) 10/02/2022   HGBA1C 6.1 (H) 12/25/2021   HGBA1C 5.9 (H) 02/14/2021   HGBA1C 5.9 (H) 09/06/2020   HGBA1C 5.8 (H) 06/28/2020   GLP-1 would be a great choice if we could get it for him  Orders: -     Hemoglobin A1c  Low testosterone Assessment & Plan: He's stopped clomid in the past 6 months (has been seeing urology, he's not sure when he stopped) Will get testosterone lab today and he will f/up with urology  Orders: -     Testosterone  Mixed hyperlipidemia Assessment & Plan: Stable, well controlled on current statin dose Recent labs reviewed Lab Results  Component Value Date   CHOL 125 08/24/2023   HDL 52 08/24/2023   LDLCALC 56 08/24/2023   TRIG 87 08/24/2023   CHOLHDL 2.4 08/24/2023         Return in about 2 weeks (around 11/26/2023) for Nurse visit- HTN re-check.   Danelle Berry, PA-C 11/12/23 9:09  AM

## 2023-11-12 NOTE — Progress Notes (Signed)
I,Amy L Pierron,acting as a scribe for Vanna Scotland, MD.,have documented all relevant documentation on the behalf of Vanna Scotland, MD,as directed by  Vanna Scotland, MD while in the presence of Vanna Scotland, MD.  11/12/2023 7:25 PM   Marc Stevens 1961-05-09 [id number]  Referring provider: Danelle Berry, PA-C [address] [place],  [place] [address]  Chief Complaint  Patient presents with   Wound Check    HPI: 63 year-old male presents for post-operative circumcision visit.  He is doing well overall. He has been having some sensitivity still.    PMH: Past Medical History:  Diagnosis Date   BPH (benign prostatic hypertrophy)    Complete rotator cuff tear 08/08/2015   Diabetes mellitus without complication (HCC)    ED (erectile dysfunction)    HTN (hypertension) 04/18/2012   Hyperlipidemia    Low testosterone    LVH (left ventricular hypertrophy)    Morbidly obese (HCC)    OSA (obstructive sleep apnea)    cpap - does not know settings    Pre-diabetes    Rotator cuff tear    RT   Torn Achilles tendon 07/11/2015   Torn Achilles tendon 07/11/2015    Surgical History: Past Surgical History:  Procedure Laterality Date   CIRCUMCISION N/A 10/04/2023   Procedure: CIRCUMCISION ADULT;  Surgeon: Vanna Scotland, MD;  Location: [facility] ORS;  Service: Urology;  Laterality: N/A;   COLONOSCOPY WITH PROPOFOL N/A 02/18/2018   Procedure: COLONOSCOPY WITH PROPOFOL;  Surgeon: Wyline Mood, MD;  Location: [facility] ENDOSCOPY;  Service: Gastroenterology;  Laterality: N/A;   COLONOSCOPY WITH PROPOFOL N/A 03/05/2023   Procedure: COLONOSCOPY WITH PROPOFOL;  Surgeon: Wyline Mood, MD;  Location: [facility] ENDOSCOPY;  Service: Gastroenterology;  Laterality: N/A;   DOPPLER ECHOCARDIOGRAPHY  2013   left kene arthroscopy      LUMBAR LAMINECTOMY/DECOMPRESSION MICRODISCECTOMY N/A 01/30/2015   Procedure: MICRO LUMBAR DECOMPRESSION, BILATERAL FORAMINOTOMIES, MICRODISCECTOMY LUMBAR FOUR TO  LUMBAR FIVE;  Surgeon: Jene Every, MD;  Location: [facility] ORS;  Service: Orthopedics;  Laterality: N/A;   right knee surgery     ROTATOR CUFF REPAIR  2007   Left   SHOULDER OPEN ROTATOR CUFF REPAIR Right 08/08/2015   Procedure: RIGHT SHOULDER MINI OPEN ROTATOR CUFF REPAIR SUBACROMIAL DECOMPRESSION ;  Surgeon: Jene Every, MD;  Location: [facility] ORS;  Service: Orthopedics;  Laterality: Right;    Home Medications:  Allergies as of 11/12/2023   No Known Allergies      Medication List        Accurate as of November 12, 2023  7:25 PM. If you have any questions, ask your nurse or doctor.          STOP taking these medications    clomiPHENE 50 MG tablet Commonly known as: CLOMID Stopped by: Danelle Berry   gabapentin 600 MG tablet Commonly known as: Neurontin Stopped by: Vanna Scotland   HYDROcodone-acetaminophen 5-325 MG tablet Commonly known as: NORCO/VICODIN Stopped by: Danelle Berry   Na Sulfate-K Sulfate-Mg Sulfate concentrate 17.5-3.13-1.6 GM/177ML Soln Stopped by: Vanna Scotland       TAKE these medications    amLODipine 10 MG tablet Commonly known as: NORVASC Take 10 mg by mouth daily.   aspirin EC 81 MG tablet Take 1 tablet (81 mg total) by mouth every morning. Resume 4 days post-op   atorvastatin 10 MG tablet Commonly known as: LIPITOR Take 1 tablet (10 mg total) by mouth daily.   carvedilol 6.25 MG tablet Commonly known as: COREG Take 0.5 tablets (3.125  mg total) by mouth 2 (two) times daily with a meal. What changed:  medication strength how much to take Changed by: Danelle Berry   Centrum Silver 50+Men Tabs Take 1 tablet by mouth daily.   ketoconazole 2 % cream Commonly known as: NIZORAL Apply topically 2 (two) times daily.   sildenafil 100 MG tablet Commonly known as: VIAGRA TAKE 1 TABLET BY MOUTH DAILY   valsartan-hydrochlorothiazide 320-25 MG tablet Commonly known as: DIOVAN-HCT Take 1 tablet by mouth daily.   Vitamin D-3 125 MCG (5000 UT)  Tabs Take 5,000 Units by mouth daily.        Family History: Family History  Problem Relation Age of Onset   Dementia Mother    Hypertension Mother    Cancer Father        colon   Heart disease Father    Congestive Heart Failure Father    Hypertension Sister    Dementia Maternal Grandmother    Kidney disease Neg Hx    Prostate cancer Neg Hx    COPD Neg Hx    Diabetes Neg Hx    Stroke Neg Hx    Kidney cancer Neg Hx    Bladder Cancer Neg Hx     Social History:  reports that he quit smoking about 19 years ago. His smoking use included cigarettes. He started smoking about 49 years ago. He has a 15 pack-year smoking history. He has never used smokeless tobacco. He reports current alcohol use of about 4.0 standard drinks of alcohol per week. He reports that he does not use drugs.   Physical Exam: BP 108/63   Pulse 79   Ht 5\' 11"  (1.803 m)   Wt 290 lb 2 oz (131.6 kg)   BMI 40.46 kg/m   Constitutional:  Alert and oriented, No acute distress. HEENT: [place] AT, moist mucus membranes.  Trachea midline, no masses. GU: Freshly circumcised phallus, well healed. No further sutures. There's the anastomosis of the skin, which is still slightly raised. But overall, healing well. There's some slight redundancy of his shaft skin, but on stretch, it's adequate. There's no residual phimosis. Skin: No rashes, bruises or suspicious lesions. Neurologic: Grossly intact, no focal deficits, moving all 4 extremities. Psychiatric: Normal mood and affect.   Assessment & Plan:    1. Circumcision  - Well healed. Cleared for sexual activity.  Return if symptoms worsen or fail to improve.  [facility] [address], [address] [place], [place] [address] [phone]

## 2023-11-12 NOTE — Assessment & Plan Note (Signed)
He's stopped clomid in the past 6 months (has been seeing urology, he's not sure when he stopped) Will get testosterone lab today and he will f/up with urology

## 2023-11-12 NOTE — Assessment & Plan Note (Addendum)
With multiple associated comorbid conditions including HTN, HLD, prediabetes, OSA, LVH Wt Readings from Last 5 Encounters:  11/12/23 288 lb (130.6 kg)  10/29/23 288 lb (130.6 kg)  09/27/23 282 lb (127.9 kg)  08/24/23 287 lb (130.2 kg)  08/13/23 283 lb 6 oz (128.5 kg)   BMI Readings from Last 5 Encounters:  11/12/23 40.17 kg/m  10/29/23 40.17 kg/m  09/27/23 39.33 kg/m  08/24/23 40.03 kg/m  08/13/23 39.52 kg/m   He is going to look into wellness programs at work, discussed net negative calories, healthier foods, decreasing simple sugars and carbs and increasing portions of fruit/vegetables lean proteins and some systematic approach to nutrition education and self monitoring of nutrition/calorie intake  - weight watchers, [employer] etc

## 2023-11-13 ENCOUNTER — Other Ambulatory Visit: Payer: Self-pay | Admitting: Urology

## 2023-11-13 DIAGNOSIS — R7989 Other specified abnormal findings of blood chemistry: Secondary | ICD-10-CM

## 2023-11-13 LAB — HEMOGLOBIN A1C
Hgb A1c MFr Bld: 6.6 %{Hb} — ABNORMAL HIGH (ref <5.7)
Mean Plasma Glucose: 143 mg/dL
eAG (mmol/L): 7.9 mmol/L

## 2023-11-13 LAB — TESTOSTERONE: Testosterone: 285 ng/dL (ref 250–827)

## 2023-11-15 ENCOUNTER — Other Ambulatory Visit: Payer: Self-pay | Admitting: *Deleted

## 2023-11-15 DIAGNOSIS — R7989 Other specified abnormal findings of blood chemistry: Secondary | ICD-10-CM

## 2023-11-16 ENCOUNTER — Other Ambulatory Visit: Payer: Self-pay | Admitting: Family Medicine

## 2023-11-16 DIAGNOSIS — E119 Type 2 diabetes mellitus without complications: Secondary | ICD-10-CM

## 2023-11-16 MED ORDER — OZEMPIC (0.25 OR 0.5 MG/DOSE) 2 MG/3ML ~~LOC~~ SOPN: PEN_INJECTOR | SUBCUTANEOUS | 3 refills | Status: DC

## 2023-11-19 ENCOUNTER — Other Ambulatory Visit: Payer: BC Managed Care – PPO

## 2023-11-19 DIAGNOSIS — R7989 Other specified abnormal findings of blood chemistry: Secondary | ICD-10-CM

## 2023-11-23 ENCOUNTER — Other Ambulatory Visit: Payer: Self-pay | Admitting: Urology

## 2023-11-23 DIAGNOSIS — E291 Testicular hypofunction: Secondary | ICD-10-CM

## 2023-11-23 LAB — TESTOSTERONE,FREE AND TOTAL
Testosterone, Free: 3.1 pg/mL — ABNORMAL LOW (ref 6.6–18.1)
Testosterone: 244 ng/dL — ABNORMAL LOW (ref 264–916)

## 2023-11-23 MED ORDER — CLOMIPHENE CITRATE 50 MG PO TABS: ORAL_TABLET | ORAL | 0 refills | Status: DC

## 2023-11-23 NOTE — Progress Notes (Unsigned)
Would you call Marc Stevens and get him scheduled for a serum testosterone lab before 10 AM in 1 month?

## 2023-11-26 ENCOUNTER — Ambulatory Visit: Payer: BC Managed Care – PPO | Admitting: Emergency Medicine

## 2023-11-26 ENCOUNTER — Ambulatory Visit: Payer: BC Managed Care – PPO | Admitting: Physician Assistant

## 2023-11-26 ENCOUNTER — Other Ambulatory Visit: Payer: BC Managed Care – PPO

## 2023-11-26 VITALS — BP 138/78 | HR 80

## 2023-11-26 DIAGNOSIS — I10 Essential (primary) hypertension: Secondary | ICD-10-CM

## 2023-11-26 NOTE — Progress Notes (Signed)
 Patient came in office for 2 week recheck BP. Blood pressure today 138/78. He has been logging his BP. The last 3 readings are as followed: 141/79, 144/83, 145/83. No symptoms noted

## 2023-11-30 ENCOUNTER — Other Ambulatory Visit: Payer: Self-pay | Admitting: Podiatry

## 2023-12-03 ENCOUNTER — Other Ambulatory Visit: Payer: Self-pay

## 2023-12-03 MED ORDER — GABAPENTIN 600 MG PO TABS: 600.0000 mg | ORAL_TABLET | Freq: Three times a day (TID) | ORAL | 3 refills | Status: DC

## 2023-12-23 ENCOUNTER — Telehealth: Payer: Self-pay | Admitting: Family Medicine

## 2023-12-23 NOTE — Telephone Encounter (Signed)
 Copied from CRM [id number]. Topic: Medical Record Request - Other >> Dec 23, 2023  8:38 AM [name] wrote: Reason for CRM: Marc Stevens decided not to go with [employer] [facility]). Please do not release any medical records to the life insurance company. He did not sign anything to release records. He did give verbal consent.

## 2023-12-23 NOTE — Telephone Encounter (Signed)
 Just FYI message from the patient

## 2023-12-24 ENCOUNTER — Other Ambulatory Visit: Payer: BC Managed Care – PPO

## 2023-12-25 ENCOUNTER — Other Ambulatory Visit: Payer: Self-pay | Admitting: Family Medicine

## 2023-12-25 DIAGNOSIS — I10 Essential (primary) hypertension: Secondary | ICD-10-CM

## 2023-12-27 ENCOUNTER — Other Ambulatory Visit: Payer: Self-pay

## 2023-12-27 MED ORDER — AMLODIPINE BESYLATE 10 MG PO TABS
10.0000 mg | ORAL_TABLET | Freq: Every day | ORAL | 0 refills | Status: DC
  Filled 2023-12-27: qty 30, 30d supply, fill #0

## 2024-01-03 ENCOUNTER — Encounter: Payer: Self-pay | Admitting: Family Medicine

## 2024-01-03 ENCOUNTER — Other Ambulatory Visit: Payer: Self-pay

## 2024-01-03 DIAGNOSIS — I10 Essential (primary) hypertension: Secondary | ICD-10-CM

## 2024-01-03 MED ORDER — AMLODIPINE BESYLATE 10 MG PO TABS
10.0000 mg | ORAL_TABLET | Freq: Every day | ORAL | 0 refills | Status: DC
Start: 2024-01-03 — End: 2024-04-18

## 2024-01-13 ENCOUNTER — Other Ambulatory Visit: Payer: BC Managed Care – PPO

## 2024-01-24 ENCOUNTER — Other Ambulatory Visit: Payer: Self-pay | Admitting: *Deleted

## 2024-01-24 DIAGNOSIS — E291 Testicular hypofunction: Secondary | ICD-10-CM

## 2024-01-25 ENCOUNTER — Other Ambulatory Visit: Payer: Self-pay | Admitting: Physician Assistant

## 2024-01-25 DIAGNOSIS — I10 Essential (primary) hypertension: Secondary | ICD-10-CM

## 2024-01-25 MED ORDER — VALSARTAN-HYDROCHLOROTHIAZIDE 320-25 MG PO TABS
1.0000 | ORAL_TABLET | Freq: Every day | ORAL | 0 refills | Status: DC
Start: 2024-01-25 — End: 2024-04-28

## 2024-01-26 ENCOUNTER — Other Ambulatory Visit: Payer: BC Managed Care – PPO

## 2024-01-26 DIAGNOSIS — E291 Testicular hypofunction: Secondary | ICD-10-CM

## 2024-01-27 LAB — TESTOSTERONE: Testosterone: 566 ng/dL (ref 264–916)

## 2024-02-08 ENCOUNTER — Encounter: Payer: Self-pay | Admitting: Family Medicine

## 2024-02-08 ENCOUNTER — Other Ambulatory Visit: Payer: Self-pay | Admitting: Family Medicine

## 2024-02-08 MED ORDER — KETOCONAZOLE 2 % EX CREA: TOPICAL_CREAM | Freq: Two times a day (BID) | CUTANEOUS | 1 refills | Status: AC

## 2024-02-10 ENCOUNTER — Other Ambulatory Visit: Payer: Self-pay | Admitting: Urology

## 2024-02-10 MED ORDER — SILDENAFIL CITRATE 20 MG PO TABS: 20.0000 mg | ORAL_TABLET | Freq: Three times a day (TID) | ORAL | 0 refills | Status: DC

## 2024-02-10 MED ORDER — TADALAFIL 5 MG PO TABS
5.0000 mg | ORAL_TABLET | Freq: Every day | ORAL | 0 refills | Status: DC | PRN
Start: 2024-02-10 — End: 2024-07-18

## 2024-02-15 ENCOUNTER — Other Ambulatory Visit: Payer: Self-pay | Admitting: Urology

## 2024-02-15 DIAGNOSIS — E291 Testicular hypofunction: Secondary | ICD-10-CM

## 2024-02-15 MED ORDER — CLOMIPHENE CITRATE 50 MG PO TABS: ORAL_TABLET | ORAL | 0 refills | Status: DC

## 2024-02-17 ENCOUNTER — Other Ambulatory Visit: Payer: Self-pay

## 2024-02-17 DIAGNOSIS — N138 Other obstructive and reflux uropathy: Secondary | ICD-10-CM

## 2024-02-17 DIAGNOSIS — E291 Testicular hypofunction: Secondary | ICD-10-CM

## 2024-02-17 DIAGNOSIS — R7989 Other specified abnormal findings of blood chemistry: Secondary | ICD-10-CM

## 2024-02-22 ENCOUNTER — Other Ambulatory Visit: Payer: Self-pay | Admitting: Urology

## 2024-02-22 ENCOUNTER — Encounter: Payer: Self-pay | Admitting: Family Medicine

## 2024-02-22 ENCOUNTER — Ambulatory Visit: Admitting: Family Medicine

## 2024-02-22 VITALS — BP 132/70 | HR 73 | Resp 16 | Ht 71.0 in | Wt 286.0 lb

## 2024-02-22 DIAGNOSIS — Z7985 Long-term (current) use of injectable non-insulin antidiabetic drugs: Secondary | ICD-10-CM

## 2024-02-22 DIAGNOSIS — G629 Polyneuropathy, unspecified: Secondary | ICD-10-CM | POA: Diagnosis not present

## 2024-02-22 DIAGNOSIS — E1159 Type 2 diabetes mellitus with other circulatory complications: Secondary | ICD-10-CM | POA: Diagnosis not present

## 2024-02-22 DIAGNOSIS — E291 Testicular hypofunction: Secondary | ICD-10-CM

## 2024-02-22 DIAGNOSIS — E119 Type 2 diabetes mellitus without complications: Secondary | ICD-10-CM | POA: Insufficient documentation

## 2024-02-22 MED ORDER — CLOMIPHENE CITRATE 50 MG PO TABS: ORAL_TABLET | ORAL | 0 refills | Status: DC

## 2024-02-22 MED ORDER — OZEMPIC (0.25 OR 0.5 MG/DOSE) 2 MG/3ML ~~LOC~~ SOPN: 0.5000 mg | PEN_INJECTOR | SUBCUTANEOUS | Status: AC

## 2024-02-22 MED ORDER — SEMAGLUTIDE (1 MG/DOSE) 4 MG/3ML ~~LOC~~ SOPN: 1.0000 mg | PEN_INJECTOR | SUBCUTANEOUS | 1 refills | Status: DC

## 2024-02-22 NOTE — Progress Notes (Signed)
 Name: Marc Stevens   MRN: [id number]    DOB: [birth year]   Date:02/22/2024       Progress Note  Chief Complaint  Patient presents with   Medical Management of Chronic Issues     Subjective:   Marc Stevens is a 63 y.o. male, presents to clinic for routine follow up on chronic conditions  F/up after starting ozempic  -  He did the starting dose and then increase to 0.5 and has tolerated this without any side effects Unfortunately when he went to get the next refill of pins he restarted the dose titration and took 0.25 again, he does the medication shots on Saturday Not on other medications for diabetes and not checking his blood sugars Lab Results  Component Value Date   HGBA1C 6.6 (H) 11/12/2023   HGBA1C 6.1 (H) 04/09/2023   HGBA1C 5.7 (A) 10/02/2022   HGBA1C 6.1 (H) 12/25/2021   HGBA1C 5.9 (H) 02/14/2021  Discussed rechecking his A1c and he would like to recheck that at the next office visit but he is due for urine microalbumin creatinine ratio testing and a diabetic foot exam today   Neuropathy with Dr. Lara Plants feet used to tingle and now they just burn all the time on gabapentin  600 mg TID He asks a lot of questions about gabapentin  medications and neuropathy but he has mostly managed this with the podiatrist  Unclear of his full evaluation it does not sound like he has had nerve conduction tests or extensive deficiency testing Symptoms are worsening Lab Results  Component Value Date   VITAMINB12 527 01/12/2020  No results found for: "IRON", "TIBC", "FERRITIN" Lab Results  Component Value Date   FOLATE 25.0 01/12/2020       Current Outpatient Medications:    amLODipine  (NORVASC ) 10 MG tablet, Take 1 tablet (10 mg total) by mouth daily., Disp: 90 tablet, Rfl: 0   aspirin  EC 81 MG tablet, Take 1 tablet (81 mg total) by mouth every morning. Resume 4 days post-op, Disp: , Rfl:    atorvastatin  (LIPITOR) 10 MG tablet, Take 1 tablet (10 mg total) by mouth  daily., Disp: 90 tablet, Rfl: 3   carvedilol  (COREG ) 6.25 MG tablet, Take 0.5 tablets (3.125 mg total) by mouth 2 (two) times daily with a meal., Disp: , Rfl:    Cholecalciferol (VITAMIN D -3) 125 MCG (5000 UT) TABS, Take 5,000 Units by mouth daily., Disp: , Rfl:    clomiPHENE  (CLOMID ) 50 MG tablet, Take one half tablet daily, Disp: 30 tablet, Rfl: 0   gabapentin  (NEURONTIN ) 600 MG tablet, Take 1 tablet (600 mg total) by mouth 3 (three) times daily., Disp: 90 tablet, Rfl: 3   ketoconazole  (NIZORAL ) 2 % cream, Apply topically 2 (two) times daily., Disp: 30 g, Rfl: 1   Multiple Vitamins-Minerals (CENTRUM SILVER 50+MEN) TABS, Take 1 tablet by mouth daily., Disp: , Rfl:    Semaglutide ,0.25 or 0.5MG /DOS, (OZEMPIC , 0.25 OR 0.5 MG/DOSE,) 2 MG/3ML SOPN, Inject 0.25 mg into the skin once a week for 28 days, THEN 0.5 mg once a week., Disp: 3 mL, Rfl: 3   sildenafil  (REVATIO ) 20 MG tablet, Take 1 tablet (20 mg total) by mouth 3 (three) times daily., Disp: 30 tablet, Rfl: 0   tadalafil  (CIALIS ) 5 MG tablet, Take 1 tablet (5 mg total) by mouth daily as needed for erectile dysfunction., Disp: 30 tablet, Rfl: 0   valsartan -hydrochlorothiazide  (DIOVAN -HCT) 320-25 MG tablet, Take 1 tablet by mouth daily., Disp: 90 tablet, Rfl: 0  Patient Active Problem List   Diagnosis Date Noted   History of colonic polyps 03/05/2023   Adenomatous polyp of colon 03/05/2023   Pre-diabetes 08/13/2017   Mitral regurgitation 01/17/2016   LVH (left ventricular hypertrophy) 01/01/2016   Low testosterone  05/14/2015   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   Vitamin D  deficiency 04/26/2015   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   Class 3 severe obesity with serious comorbidity and body mass index (BMI) of 40.0 to 44.9 in adult    ED (erectile dysfunction)    Hyperlipidemia    OSA (obstructive sleep apnea)    HTN (hypertension) 04/18/2012    Past Surgical History:  Procedure Laterality Date   CIRCUMCISION  N/A 10/04/2023   Procedure: CIRCUMCISION ADULT;  Surgeon: Dustin Gimenez, MD;  Location: [facility] ORS;  Service: Urology;  Laterality: N/A;   COLONOSCOPY WITH PROPOFOL  N/A 02/18/2018   Procedure: COLONOSCOPY WITH PROPOFOL ;  Surgeon: Luke Salaam, MD;  Location: [facility] ENDOSCOPY;  Service: Gastroenterology;  Laterality: N/A;   COLONOSCOPY WITH PROPOFOL  N/A 03/05/2023   Procedure: COLONOSCOPY WITH PROPOFOL ;  Surgeon: Luke Salaam, MD;  Location: [facility] ENDOSCOPY;  Service: Gastroenterology;  Laterality: N/A;   DOPPLER ECHOCARDIOGRAPHY  2013   left kene arthroscopy      LUMBAR LAMINECTOMY/DECOMPRESSION MICRODISCECTOMY N/A 01/30/2015   Procedure: MICRO LUMBAR DECOMPRESSION, BILATERAL FORAMINOTOMIES, MICRODISCECTOMY LUMBAR FOUR TO LUMBAR FIVE;  Surgeon: Orvan Blanch, MD;  Location: [facility] ORS;  Service: Orthopedics;  Laterality: N/A;   right knee surgery     ROTATOR CUFF REPAIR  2007   Left   SHOULDER OPEN ROTATOR CUFF REPAIR Right 08/08/2015   Procedure: RIGHT SHOULDER MINI OPEN ROTATOR CUFF REPAIR SUBACROMIAL DECOMPRESSION ;  Surgeon: Orvan Blanch, MD;  Location: [facility] ORS;  Service: Orthopedics;  Laterality: Right;    Family History  Problem Relation Age of Onset   Dementia Mother    Hypertension Mother    Cancer Father        colon   Heart disease Father    Congestive Heart Failure Father    Hypertension Sister    Dementia Maternal Grandmother    Kidney disease Neg Hx    Prostate cancer Neg Hx    COPD Neg Hx    Diabetes Neg Hx    Stroke Neg Hx    Kidney cancer Neg Hx    Bladder Cancer Neg Hx     Social History   Tobacco Use   Smoking status: Former    Current packs/day: 0.00    Average packs/day: 0.5 packs/day for 30.0 years (15.0 ttl pk-yrs)    Types: Cigarettes    Start date: 10/19/1974    Quit date: 10/19/2004    Years since quitting: 19.3   Smokeless tobacco: Never  Vaping Use   Vaping status: Never Used  Substance Use Topics   Alcohol use: Yes    Alcohol/week: 4.0 standard drinks of  alcohol    Types: 4 Shots of liquor per week    Comment: occasional   Drug use: No     No Known Allergies  Health Maintenance  Topic Date Due   COVID-19 Vaccine (3 - 2024-25 season) 06/20/2023   Zoster Vaccines- Shingrix (1 of 2) 05/24/2024 (Originally 02/11/2012)   Pneumococcal Vaccine 45-42 Years old (1 of 2 - PCV) 02/21/2025 (Originally 02/10/1981)   Diabetic kidney evaluation - Urine ACR  04/08/2024   INFLUENZA VACCINE  05/19/2024   Diabetic kidney evaluation - eGFR measurement  08/23/2024   DTaP/Tdap/Td (4 - Td or Tdap) 11/14/2027  Colonoscopy  03/04/2030   Hepatitis C Screening  Completed   HIV Screening  Completed   HPV VACCINES  Aged Out   Meningococcal B Vaccine  Aged Out    Chart Review Today: I personally reviewed active problem list, medication list, allergies, family history, social history, health maintenance, notes from last encounter, lab results, imaging with the patient/caregiver today.   Review of Systems  All other systems reviewed and are negative.    Objective:   Vitals:   02/22/24 0859  BP: 132/70  Pulse: 73  Resp: 16  SpO2: 97%  Weight: 286 lb (129.7 kg)  Height: 5\' 11"  (1.803 m)    Body mass index is 39.89 kg/m.  Physical Exam Vitals and nursing note reviewed.  Constitutional:      General: He is not in acute distress.    Appearance: Normal appearance. He is well-developed. He is obese. He is not ill-appearing, toxic-appearing or diaphoretic.  HENT:     Head: Normocephalic and atraumatic.     Nose: Nose normal.  Eyes:     General:        Right eye: No discharge.        Left eye: No discharge.     Conjunctiva/sclera: Conjunctivae normal.  Neck:     Trachea: No tracheal deviation.  Cardiovascular:     Rate and Rhythm: Normal rate and regular rhythm.     Pulses: Normal pulses.          Dorsalis pedis pulses are 2+ on the right side and 2+ on the left side.       Posterior tibial pulses are 2+ on the right side and 2+ on the left  side.     Heart sounds: Normal heart sounds.  Pulmonary:     Effort: Pulmonary effort is normal. No respiratory distress.     Breath sounds: No stridor.  Feet:     Right foot:     Skin integrity: Skin integrity normal. No ulcer, blister, skin breakdown, erythema, callus or fissure.     Toenail Condition: Right toenails are normal.     Left foot:     Skin integrity: Skin integrity normal. No ulcer, blister, skin breakdown, erythema or callus.     Toenail Condition: Left toenails are normal.     Comments: See dm foot exam below Skin:    General: Skin is warm and dry.     Findings: No rash.  Neurological:     Mental Status: He is alert.     Motor: No abnormal muscle tone.     Coordination: Coordination normal.  Psychiatric:        Behavior: Behavior normal.    Diabetic Foot Exam - Simple   Simple Foot Form Diabetic Foot exam was performed with the following findings: Yes 02/22/2024  9:30 AM  Visual Inspection No deformities, no ulcerations, no other skin breakdown bilaterally: Yes Sensation Testing Intact to touch and monofilament testing bilaterally: Yes Pulse Check Posterior Tibialis and Dorsalis pulse intact bilaterally: Yes Comments       Results for orders placed or performed in visit on 01/26/24  Testosterone    Collection Time: 01/26/24  8:06 AM  Result Value Ref Range   Testosterone  566 264 - 916 ng/dL      Assessment & Plan:   Type 2 diabetes mellitus without complication, without long-term current use of insulin (HCC) Assessment & Plan: Here for f/up on ozempic  for new onset DM Tolerating meds Will need to do 0.50 mg  weekly dose for the next 3 weeks then increase to 1 mg dose Meds/rx adjusted Lab Results  Component Value Date   HGBA1C 6.6 (H) 11/12/2023  He will do labs in 3 more months but was able to do UACR testing and DM foot exam today   Orders: -     Semaglutide  (1 MG/DOSE); Inject 1 mg as directed once a week.  Dispense: 9 mL; Refill: 1 -      Ozempic  (0.25 or 0.5 MG/DOSE); Inject 0.5 mg into the skin once a week for 3 doses. -     Microalbumin / creatinine urine ratio -     Referral to Nutrition and Diabetes Services  Neuropathy Assessment & Plan: Thorough chart review of this hx since I was not managing since I have known him Sx addressed with prior PCP Dr. Delfina Feller in 2020, then referred to podiatry Dr. 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   No back pain, no sciatica or radicular sx, b/l burning/neuropathy worsening Lab Results  Component Value Date   VITAMINB12 527 01/12/2020   Lab Results  Component Value Date   TSH 0.55 12/29/2019      Latest Ref Rng & Units 08/24/2023    9:20 AM 04/09/2023    9:41 AM 12/25/2021    9:38 AM  CBC  WBC 3.8 - 10.8 Thousand/uL 4.3  5.3  5.2   Hemoglobin 13.2 - 17.1 g/dL 16.1  09.6  04.5   Hematocrit 38.5 - 50.0 % 46.4  48.1  46.8   Platelets 140 - 400 Thousand/uL 280  315  281   No results found for: "IRON", "TIBC", "FERRITIN"          Return in about 3 months (around 05/24/2024) for DM/A1c and other labs.   Adeline Hone, PA-C 02/22/24 9:28 AM

## 2024-02-22 NOTE — Assessment & Plan Note (Signed)
 Here for f/up on ozempic  for new onset DM Tolerating meds Will need to do 0.50 mg weekly dose for the next 3 weeks then increase to 1 mg dose Meds/rx adjusted Lab Results  Component Value Date   HGBA1C 6.6 (H) 11/12/2023  He will do labs in 3 more months but was able to do UACR testing and DM foot exam today

## 2024-02-22 NOTE — Assessment & Plan Note (Signed)
 Thorough chart review of this hx since I was not managing since I have known him Sx addressed with prior PCP Dr. Delfina Feller in 2020, then referred to podiatry Dr. 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   No back pain, no sciatica or radicular sx, b/l burning/neuropathy worsening Lab Results  Component Value Date   VITAMINB12 527 01/12/2020   Lab Results  Component Value Date   TSH 0.55 12/29/2019      Latest Ref Rng & Units 08/24/2023    9:20 AM 04/09/2023    9:41 AM 12/25/2021    9:38 AM  CBC  WBC 3.8 - 10.8 Thousand/uL 4.3  5.3  5.2   Hemoglobin 13.2 - 17.1 g/dL 16.1  09.6  04.5   Hematocrit 38.5 - 50.0 % 46.4  48.1  46.8   Platelets 140 - 400 Thousand/uL 280  315  281   No results found for: "IRON", "TIBC", "FERRITIN"

## 2024-02-22 NOTE — Patient Instructions (Signed)
 You can supplement folate, thiamine and B12 which if you were low or if you recently stopped drinking alcohol can help with alcohol related deficiencies   We can check your iron as well - deficiency in that can cause some nerve pain issues  Talk to Dr. Lara Plants about changing gabapentin  if your trying to get off

## 2024-02-23 LAB — MICROALBUMIN / CREATININE URINE RATIO
Creatinine, Urine: 154 mg/dL (ref 20–320)
Microalb Creat Ratio: 11 mg/g{creat} (ref <30)
Microalb, Ur: 1.7 mg/dL

## 2024-02-24 ENCOUNTER — Encounter: Payer: Self-pay | Admitting: Family Medicine

## 2024-04-16 ENCOUNTER — Other Ambulatory Visit: Payer: Self-pay | Admitting: Family Medicine

## 2024-04-16 DIAGNOSIS — E782 Mixed hyperlipidemia: Secondary | ICD-10-CM

## 2024-04-16 DIAGNOSIS — I10 Essential (primary) hypertension: Secondary | ICD-10-CM

## 2024-04-17 ENCOUNTER — Encounter: Payer: Self-pay | Admitting: Family Medicine

## 2024-04-18 NOTE — Telephone Encounter (Signed)
 Requested Prescriptions  Pending Prescriptions Disp Refills   atorvastatin  (LIPITOR) 10 MG tablet [Pharmacy Med Name: ATORVASTATIN  10 MG TABLET] 90 tablet 0    Sig: TAKE 1 TABLET BY MOUTH EVERY DAY     Cardiovascular:  Antilipid - Statins Failed - 04/18/2024 11:12 AM      Failed - Lipid Panel in normal range within the last 12 months    Cholesterol, Total  Date Value Ref Range Status  02/21/2016 137 100 - 199 mg/dL Final   Cholesterol  Date Value Ref Range Status  08/24/2023 125 <200 mg/dL Final   LDL Cholesterol (Calc)  Date Value Ref Range Status  08/24/2023 56 mg/dL (calc) Final    Comment:    Reference range: <100 . Desirable range <100 mg/dL for primary prevention;   <70 mg/dL for patients with CHD or diabetic patients  with > or = 2 CHD risk factors. SABRA LDL-C is now calculated using the Martin-Hopkins  calculation, which is a validated novel method providing  better accuracy than the Friedewald equation in the  estimation of LDL-C.  Gladis APPLETHWAITE et al. SANDREA. 7986;689(80): 2061-2068  (http://education.[employer].com/faq/FAQ164)    HDL  Date Value Ref Range Status  08/24/2023 52 > OR = 40 mg/dL Final  94/94/7982 48 >60 mg/dL Final   Triglycerides  Date Value Ref Range Status  08/24/2023 87 <150 mg/dL Final         Passed - Patient is not pregnant      Passed - Valid encounter within last 12 months    Recent Outpatient Visits           1 month ago Type 2 diabetes mellitus without complication, without long-term current use of insulin (HCC)   [place] [facility] Leavy Mole, PA-C       Future Appointments             In 1 month McGowan, Clotilda DELENA RIGGERS [facility] [place]   In 1 month Leavy Mole, PA-C [facility]             amLODipine  (NORVASC ) 10 MG tablet [Pharmacy Med Name: AMLODIPINE  BESYLATE 10 MG TAB] 90 tablet 0    Sig: TAKE 1 TABLET BY MOUTH EVERY DAY     Cardiovascular:  Calcium  Channel Blockers 2 Passed - 04/18/2024 11:12 AM      Passed - Last BP in normal range    BP Readings from Last 1 Encounters:  02/22/24 132/70         Passed - Last Heart Rate in normal range    Pulse Readings from Last 1 Encounters:  02/22/24 73         Passed - Valid encounter within last 6 months    Recent Outpatient Visits           1 month ago Type 2 diabetes mellitus without complication, without long-term current use of insulin (HCC)   [place] [facility] Leavy Mole, PA-C       Future Appointments             In 1 month McGowan, Clotilda DELENA RIGGERS [facility] [place]   In 1 month Leavy Mole, PA-C [facility], [facility]

## 2024-04-26 ENCOUNTER — Other Ambulatory Visit: Payer: Self-pay

## 2024-04-26 MED ORDER — SILDENAFIL CITRATE 100 MG PO TABS: 100.0000 mg | ORAL_TABLET | Freq: Every day | ORAL | 1 refills | Status: DC | PRN

## 2024-04-27 ENCOUNTER — Other Ambulatory Visit: Payer: Self-pay | Admitting: Family Medicine

## 2024-04-27 DIAGNOSIS — I10 Essential (primary) hypertension: Secondary | ICD-10-CM

## 2024-04-28 NOTE — Telephone Encounter (Signed)
 Requested medication (s) are due for refill today:   Yes  Requested medication (s) are on the active medication list:   Yes  Future visit scheduled:   Yes  8/8   Last ordered: 01/25/2024 #90, 0 refills  Unable to refill because labs are due   Requested Prescriptions  Pending Prescriptions Disp Refills   valsartan -hydrochlorothiazide  (DIOVAN -HCT) 320-25 MG tablet [Pharmacy Med Name: VALSARTAN -HCTZ 320-25 MG TAB] 90 tablet 0    Sig: TAKE 1 TABLET BY MOUTH EVERY DAY     Cardiovascular: ARB + Diuretic Combos Failed - 04/28/2024  2:43 PM      Failed - K in normal range and within 180 days    Potassium  Date Value Ref Range Status  08/24/2023 4.4 3.5 - 5.3 mmol/L Final  04/04/2014 3.7 3.5 - 5.1 mmol/L Final         Failed - Na in normal range and within 180 days    Sodium  Date Value Ref Range Status  08/24/2023 144 135 - 146 mmol/L Final  02/21/2016 141 134 - 144 mmol/L Final  04/04/2014 144 136 - 145 mmol/L Final         Failed - Cr in normal range and within 180 days    Creat  Date Value Ref Range Status  08/24/2023 1.09 0.70 - 1.35 mg/dL Final   Creatinine, Urine  Date Value Ref Range Status  02/22/2024 154 20 - 320 mg/dL Final         Failed - eGFR is 10 or above and within 180 days    GFR, Est African American  Date Value Ref Range Status  09/06/2020 80 > OR = 60 mL/min/1.71m2 Final   GFR, Est Non African American  Date Value Ref Range Status  09/06/2020 69 > OR = 60 mL/min/1.45m2 Final   GFR, Estimated  Date Value Ref Range Status  01/20/2021 >60 >60 mL/min Final    Comment:    (NOTE) Calculated using the CKD-EPI Creatinine Equation (2021)    eGFR  Date Value Ref Range Status  08/24/2023 77 > OR = 60 mL/min/1.12m2 Final         Passed - Patient is not pregnant      Passed - Last BP in normal range    BP Readings from Last 1 Encounters:  02/22/24 132/70         Passed - Valid encounter within last 6 months    Recent Outpatient Visits            2 months ago Type 2 diabetes mellitus without complication, without long-term current use of insulin (HCC)   [place] [facility] Leavy Mole, PA-C       Future Appointments             In 4 weeks McGowan, Clotilda DELENA RIGGERS [facility] [place]   In 4 weeks Leavy Mole, PA-C [facility], [facility]

## 2024-05-19 ENCOUNTER — Other Ambulatory Visit

## 2024-05-25 NOTE — Progress Notes (Signed)
 05/26/2024 9:08 AM   Marc Stevens 02-14-1961 [id number]  Referring provider: Leavy Mole, PA-C [address] [place],  [place] [address]  Urological history: 1.  BPH with LU TS -PSA pending    2. ED -Contributing factors of age, HTN, sleep apnea, BPH, obesity, hyperlipidemia and age -sildenafil  100 mg, on-demand dosing   3. Prostate cancer screening -baseline PSA 1.8 (2016) at age 63 -PSA at age 76 - 1.39 -african ancestry  -?prostate cancer in father - died in his late 83's of CHF  4. Phimosis/Balanits -circumcision (09/2023)   Chief Complaint  Patient presents with   Follow-up   [name] is a 63 y.o. man who presents today for follow up.   Previous records reviewed.   He has not been able to get his Clomid  prescription filled as the pharmacy does not have the supply.   He has no urinary complaints.  Patient denies any modifying or aggravating factors.  Patient denies any recent UTI's, gross hematuria, dysuria or suprapubic/flank pain.  Patient denies any fevers, chills, nausea or vomiting.    He is having suboptimal results with PDE 5 inhibitors in regards to his erections.  Patient is not having spontaneous erections.  He denies any pain or curvature with erections.    PMH: Past Medical History:  Diagnosis Date   BPH (benign prostatic hypertrophy)    Complete rotator cuff tear 08/08/2015   Diabetes mellitus without complication (HCC)    ED (erectile dysfunction)    HTN (hypertension) 04/18/2012   Hyperlipidemia    Low testosterone     LVH (left ventricular hypertrophy)    Morbidly obese (HCC)    OSA (obstructive sleep apnea)    cpap - does not know settings    Pre-diabetes    Rotator cuff tear    RT   Torn Achilles tendon 07/11/2015   Torn Achilles tendon 07/11/2015    Surgical History: Past Surgical History:  Procedure Laterality Date   CIRCUMCISION N/A 10/04/2023   Procedure: CIRCUMCISION ADULT;  Surgeon:  Penne Knee, MD;  Location: [facility] ORS;  Service: Urology;  Laterality: N/A;   COLONOSCOPY WITH PROPOFOL  N/A 02/18/2018   Procedure: COLONOSCOPY WITH PROPOFOL ;  Surgeon: Therisa Bi, MD;  Location: [facility] ENDOSCOPY;  Service: Gastroenterology;  Laterality: N/A;   COLONOSCOPY WITH PROPOFOL  N/A 03/05/2023   Procedure: COLONOSCOPY WITH PROPOFOL ;  Surgeon: Therisa Bi, MD;  Location: [facility] ENDOSCOPY;  Service: Gastroenterology;  Laterality: N/A;   DOPPLER ECHOCARDIOGRAPHY  2013   left kene arthroscopy      LUMBAR LAMINECTOMY/DECOMPRESSION MICRODISCECTOMY N/A 01/30/2015   Procedure: MICRO LUMBAR DECOMPRESSION, BILATERAL FORAMINOTOMIES, MICRODISCECTOMY LUMBAR FOUR TO LUMBAR FIVE;  Surgeon: Reyes Billing, MD;  Location: [facility];  Service: Orthopedics;  Laterality: N/A;   right knee surgery     ROTATOR CUFF REPAIR  2007   Left   SHOULDER OPEN ROTATOR CUFF REPAIR Right 08/08/2015   Procedure: RIGHT SHOULDER MINI OPEN ROTATOR CUFF REPAIR SUBACROMIAL DECOMPRESSION ;  Surgeon: Reyes Billing, MD;  Location: [facility];  Service: Orthopedics;  Laterality: Right;    Home Medications:  Allergies as of 05/26/2024   No Known Allergies      Medication List        Accurate as of May 26, 2024 11:59 PM. If you have any questions, ask your nurse or doctor.          amLODipine  10 MG tablet Commonly known as: NORVASC  TAKE 1 TABLET BY MOUTH EVERY DAY   aspirin   EC 81 MG tablet Take 1 tablet (81 mg total) by mouth every morning. Resume 4 days post-op   atorvastatin  10 MG tablet Commonly known as: LIPITOR TAKE 1 TABLET BY MOUTH EVERY DAY   carvedilol  6.25 MG tablet Commonly known as: COREG  Take 0.5 tablets (3.125 mg total) by mouth 2 (two) times daily with a meal.   Centrum Silver 50+Men Tabs Take 1 tablet by mouth daily.   clomiPHENE  50 MG tablet Commonly known as: CLOMID  Take one half tablet daily   gabapentin  600 MG tablet Commonly known as: Neurontin  Take 1 tablet (600 mg total) by mouth 3  (three) times daily.   ketoconazole  2 % cream Commonly known as: NIZORAL  Apply topically 2 (two) times daily.   Semaglutide  (1 MG/DOSE) 4 MG/3ML Sopn Inject 1 mg as directed once a week.   sildenafil  100 MG tablet Commonly known as: VIAGRA  Take 1 tablet (100 mg total) by mouth daily as needed for erectile dysfunction.   sildenafil  20 MG tablet Commonly known as: REVATIO  Take 1 tablet (20 mg total) by mouth 3 (three) times daily.   tadalafil  5 MG tablet Commonly known as: CIALIS  Take 1 tablet (5 mg total) by mouth daily as needed for erectile dysfunction.   valsartan -hydrochlorothiazide  320-25 MG tablet Commonly known as: DIOVAN -HCT TAKE 1 TABLET BY MOUTH EVERY DAY   Vitamin D -3 125 MCG (5000 UT) Tabs Take 5,000 Units by mouth daily.        Allergies: No Known Allergies  Family History: Family History  Problem Relation Age of Onset   Dementia Mother    Hypertension Mother    Varicose Veins Mother    Cancer Father        colon   Heart disease Father    Congestive Heart Failure Father    Hypertension Sister    Dementia Maternal Grandmother    Kidney disease Neg Hx    Prostate cancer Neg Hx    COPD Neg Hx    Diabetes Neg Hx    Stroke Neg Hx    Kidney cancer Neg Hx    Bladder Cancer Neg Hx     Social History: See HPI for pertinent social history  ROS: Pertinent ROS in HPI  Physical Exam: BP 125/75   Pulse 78   Ht 5' 11 (1.803 m)   Wt 280 lb (127 kg)   BMI 39.05 kg/m   Constitutional:  Well nourished. Alert and oriented, No acute distress. HEENT: [place] AT, moist mucus membranes.  Trachea midline Cardiovascular: No clubbing, cyanosis, or edema. Respiratory: Normal respiratory effort, no increased work of breathing. GU: No CVA tenderness.  No bladder fullness or masses.  Patient with circumcised phallus.   Urethral meatus is patent.  No penile discharge. No penile lesions or rashes. Scrotum without lesions, cysts, rashes and/or edema.  Testicles are  located scrotally bilaterally. No masses are appreciated in the testicles. Left and right epididymis are normal. Rectal: Patient with  normal sphincter tone. Anus and perineum without scarring or rashes. No rectal masses are appreciated. Prostate is approximately 50 +  grams, could not palpate the entire gland, but there were no nodules appreciated in the areas I could palpate.  Seminal vesicles cannot be palpated. Neurologic: Grossly intact, no focal deficits, moving all 4 extremities. Psychiatric: Normal mood and affect.  Laboratory Data: See EPIC and HPI  I have reviewed the labs.   Pertinent Imaging: N/A  Assessment & Plan:    1. Hypogonadism -He will try to see if  other pharmacies have Clomid  in stock and he will let me know once he starts that medication and we will get him back for a repeat testosterone  level, liver panel level and hemoglobin hematocrit panel      2. BPH with LU TS - stable mild symptoms  - PSA pending  - DRE benign  - encouraged avoiding bladder irritants, fluid restriction before bedtime and timed voiding's - educated on red flag symptoms: acute retention, gross hematuria, fever, severe pain - advised to call clinic or go to the ED if these occur - return to clinic in 12  months symptom re-evaluation   3. ED - he is not interested in any different treatments for his ED at this time - will assess again once his testosterone  is within therapeutic range   Return for Patient will call once he starts Cloimd to schedule labs .  He will need a testosterone , hepatic panel, hemoglobin and hematocrit after he has been on Clomid  50 mg, 1/2 tablets daily for 30 days   These notes generated with voice recognition software. I apologize for typographical errors.  CLOTILDA HELON RIGGERS  [facility] [address]  [address] [place], [place] [address] [phone]

## 2024-05-26 ENCOUNTER — Encounter (INDEPENDENT_AMBULATORY_CARE_PROVIDER_SITE_OTHER): Admitting: Family Medicine

## 2024-05-26 ENCOUNTER — Encounter: Payer: Self-pay | Admitting: Urology

## 2024-05-26 ENCOUNTER — Encounter: Payer: Self-pay | Admitting: Family Medicine

## 2024-05-26 ENCOUNTER — Ambulatory Visit (INDEPENDENT_AMBULATORY_CARE_PROVIDER_SITE_OTHER): Admitting: Urology

## 2024-05-26 VITALS — BP 125/75 | HR 78 | Ht 71.0 in | Wt 280.0 lb

## 2024-05-26 DIAGNOSIS — E66813 Obesity, class 3: Secondary | ICD-10-CM

## 2024-05-26 DIAGNOSIS — N138 Other obstructive and reflux uropathy: Secondary | ICD-10-CM

## 2024-05-26 DIAGNOSIS — I10 Essential (primary) hypertension: Secondary | ICD-10-CM

## 2024-05-26 DIAGNOSIS — N529 Male erectile dysfunction, unspecified: Secondary | ICD-10-CM

## 2024-05-26 DIAGNOSIS — E119 Type 2 diabetes mellitus without complications: Secondary | ICD-10-CM

## 2024-05-26 DIAGNOSIS — E291 Testicular hypofunction: Secondary | ICD-10-CM

## 2024-05-26 DIAGNOSIS — N401 Enlarged prostate with lower urinary tract symptoms: Secondary | ICD-10-CM | POA: Diagnosis not present

## 2024-05-26 DIAGNOSIS — G629 Polyneuropathy, unspecified: Secondary | ICD-10-CM

## 2024-05-26 MED ORDER — CLOMIPHENE CITRATE 50 MG PO TABS: ORAL_TABLET | ORAL | 0 refills | Status: DC

## 2024-05-27 LAB — HEPATIC FUNCTION PANEL
ALT: 27 IU/L (ref 0–44)
AST: 25 IU/L (ref 0–40)
Albumin: 4.3 g/dL (ref 3.9–4.9)
Alkaline Phosphatase: 104 IU/L (ref 44–121)
Bilirubin Total: 0.4 mg/dL (ref 0.0–1.2)
Bilirubin, Direct: 0.16 mg/dL (ref 0.00–0.40)
Total Protein: 6.2 g/dL (ref 6.0–8.5)

## 2024-05-27 LAB — PSA
Prostate Specific Ag, Serum: 1 ng/mL (ref 0.0–4.0)
Prostate Specific Ag, Serum: 1 ng/mL (ref 0.0–4.0)

## 2024-05-27 LAB — HEMOGLOBIN AND HEMATOCRIT, BLOOD
Hematocrit: 48.5 % (ref 37.5–51.0)
Hemoglobin: 14.5 g/dL (ref 13.0–17.7)

## 2024-05-27 LAB — TESTOSTERONE: Testosterone: 358 ng/dL (ref 264–916)

## 2024-06-01 ENCOUNTER — Encounter: Payer: Self-pay | Admitting: Family Medicine

## 2024-06-01 NOTE — Progress Notes (Signed)
 Pt left and rescheduled with out being seen due to wait today Marc Cower, PA-C

## 2024-06-23 ENCOUNTER — Ambulatory Visit: Admitting: Family Medicine

## 2024-07-07 ENCOUNTER — Ambulatory Visit: Admitting: Family Medicine

## 2024-07-08 ENCOUNTER — Other Ambulatory Visit: Payer: Self-pay | Admitting: Urology

## 2024-07-14 ENCOUNTER — Other Ambulatory Visit
Admission: RE | Admit: 2024-07-14 | Discharge: 2024-07-14 | Disposition: A | Discharge status: Home / Self Care | Attending: Urology | Admitting: Urology

## 2024-07-14 DIAGNOSIS — E291 Testicular hypofunction: Secondary | ICD-10-CM | POA: Diagnosis present

## 2024-07-14 DIAGNOSIS — N138 Other obstructive and reflux uropathy: Secondary | ICD-10-CM | POA: Diagnosis present

## 2024-07-14 DIAGNOSIS — R7989 Other specified abnormal findings of blood chemistry: Secondary | ICD-10-CM | POA: Insufficient documentation

## 2024-07-14 DIAGNOSIS — N401 Enlarged prostate with lower urinary tract symptoms: Secondary | ICD-10-CM | POA: Diagnosis present

## 2024-07-14 LAB — COMPREHENSIVE METABOLIC PANEL WITH GFR
ALT: 18 U/L (ref 0–44)
AST: 19 U/L (ref 15–41)
Albumin: 4 g/dL (ref 3.5–5.0)
Alkaline Phosphatase: 66 U/L (ref 38–126)
Anion gap: 7 (ref 5–15)
BUN: 14 mg/dL (ref 8–23)
CO2: 26 mmol/L (ref 22–32)
Calcium: 9 mg/dL (ref 8.9–10.3)
Chloride: 106 mmol/L (ref 98–111)
Creatinine, Ser: 1.04 mg/dL (ref 0.61–1.24)
GFR, Estimated: >60 mL/min (ref >60)
Glucose, Bld: 92 mg/dL (ref 70–99)
Potassium: 3.8 mmol/L (ref 3.5–5.1)
Sodium: 139 mmol/L (ref 135–145)
Total Bilirubin: 0.4 mg/dL (ref 0.0–1.2)
Total Protein: 6.7 g/dL (ref 6.5–8.1)

## 2024-07-15 ENCOUNTER — Ambulatory Visit: Payer: Self-pay | Admitting: Urology

## 2024-07-15 ENCOUNTER — Other Ambulatory Visit: Payer: Self-pay | Admitting: Family Medicine

## 2024-07-15 DIAGNOSIS — I10 Essential (primary) hypertension: Secondary | ICD-10-CM

## 2024-07-15 DIAGNOSIS — E291 Testicular hypofunction: Secondary | ICD-10-CM

## 2024-07-15 DIAGNOSIS — E782 Mixed hyperlipidemia: Secondary | ICD-10-CM

## 2024-07-15 LAB — TESTOSTERONE: Testosterone: 534 ng/dL (ref 264–916)

## 2024-07-18 ENCOUNTER — Encounter: Payer: Self-pay | Admitting: Internal Medicine

## 2024-07-18 ENCOUNTER — Other Ambulatory Visit: Payer: Self-pay

## 2024-07-18 ENCOUNTER — Other Ambulatory Visit: Payer: Self-pay | Admitting: Internal Medicine

## 2024-07-18 ENCOUNTER — Ambulatory Visit: Admitting: Family Medicine

## 2024-07-18 ENCOUNTER — Ambulatory Visit: Admitting: Internal Medicine

## 2024-07-18 VITALS — BP 132/78 | HR 78 | Temp 98.0°F | Resp 16 | Ht 71.0 in | Wt 277.2 lb

## 2024-07-18 DIAGNOSIS — E1142 Type 2 diabetes mellitus with diabetic polyneuropathy: Secondary | ICD-10-CM | POA: Diagnosis not present

## 2024-07-18 DIAGNOSIS — E782 Mixed hyperlipidemia: Secondary | ICD-10-CM | POA: Diagnosis not present

## 2024-07-18 DIAGNOSIS — I10 Essential (primary) hypertension: Secondary | ICD-10-CM

## 2024-07-18 DIAGNOSIS — D126 Benign neoplasm of colon, unspecified: Secondary | ICD-10-CM | POA: Diagnosis not present

## 2024-07-18 DIAGNOSIS — Z7985 Long-term (current) use of injectable non-insulin antidiabetic drugs: Secondary | ICD-10-CM

## 2024-07-18 LAB — POCT GLYCOSYLATED HEMOGLOBIN (HGB A1C): Hemoglobin A1C: 5.6 % (ref 4.0–5.6)

## 2024-07-18 MED ORDER — SEMAGLUTIDE (1 MG/DOSE) 4 MG/3ML ~~LOC~~ SOPN: 1.0000 mg | PEN_INJECTOR | SUBCUTANEOUS | 1 refills | Status: AC

## 2024-07-18 MED ORDER — CARVEDILOL 12.5 MG PO TABS
12.5000 mg | ORAL_TABLET | Freq: Two times a day (BID) | ORAL | 1 refills | Status: AC
Start: 2024-07-18 — End: ?

## 2024-07-18 MED ORDER — VALSARTAN-HYDROCHLOROTHIAZIDE 320-25 MG PO TABS: 1.0000 | ORAL_TABLET | Freq: Every day | ORAL | 1 refills | Status: AC

## 2024-07-18 MED ORDER — ATORVASTATIN CALCIUM 10 MG PO TABS: 10.0000 mg | ORAL_TABLET | Freq: Every day | ORAL | 1 refills | Status: AC

## 2024-07-18 MED ORDER — AMLODIPINE BESYLATE 10 MG PO TABS: 10.0000 mg | ORAL_TABLET | Freq: Every day | ORAL | 1 refills | Status: AC

## 2024-07-18 NOTE — Progress Notes (Signed)
 Established Patient Office Visit  Subjective   [name], male    DOB: 04/07/1961  Age: 63 y.o. MRN: [id number]  Chief Complaint  Patient presents with   Medical Management of Chronic Issues    HPI  Patient is here for follow up on chronic medical conditions.   Discussed the use of AI scribe software for clinical note transcription with the patient, who gave verbal consent to proceed.  History of Present Illness Marc Stevens is a 63 year old male with type 2 diabetes and hypertension who presents for a routine follow-up visit.  He has been on Ozempic  1 mg for type 2 diabetes for two months without gastrointestinal side effects. His A1c has improved from 6.6 in January to 5.6. Hypertension is managed with amlodipine  10 mg, carvedilol , and valsartan  hydrochlorothiazide . He is uncertain about his carvedilol  dose, suspecting it might be higher than the 6.25 mg recorded, and plans to verify it at home.  He discontinued gabapentin  for neuropathy as it was not effective. Improved blood sugar control has reduced symptoms to a mild tingling in one toe. He takes atorvastatin  10 mg at night for cholesterol management.  He recently sustained a cracked kneecap from a fall and has started physical therapy, noting improvement in symptoms.   Hypertension/OSA: -Medications: Amlodipine  10 mg, Coreg  3.125 mg BID (uncertain which dose he is on currently, will call with correct dose for refills), valsartan -HCTZ 320-25 mg -Patient is compliant with above medications and reports no side effects. -Denies any SOB, CP, vision changes, LE edema or symptoms of hypotension -Compliant with CPAP    HLD: -Medications: Lipitor 10 mg, aspirin   -Patient is compliant with above medications and reports no side effects.  -Lipid Panel     Component Value Date/Time   CHOL 125 08/24/2023 0920   CHOL 137 02/21/2016 1110   TRIG 87 08/24/2023 0920   HDL 52 08/24/2023 0920   HDL  48 02/21/2016 1110   CHOLHDL 2.4 08/24/2023 0920   VLDL 14 03/12/2017 1043   LDLCALC 56 08/24/2023 0920   LABVLDL 18 02/21/2016 1110    Type 2 Diabetes: -Last A1c 1/25 6.6% -Medications: Ozempic  1 mg -Patient is compliant with the above medications and reports no side effects.  -Foot exam: UTD -Microalbumin: UTD -Statin: Yes -PNA vaccine: Due, politely declines today -No longer on Gabapentin  600 mg TID for neuropathy, symptoms have improved with better blood sugar control   Vitamin D  deficiency: -Currently on vitamin D  supplements 5000 IU   Health maintenance: -Blood work up-to-date -Colon cancer screening: Colonoscopy 5/24, repeat in 5 years -Discussed Prevnar 20, politely declines   Patient Active Problem List   Diagnosis Date Noted   Type 2 diabetes mellitus without complication, without long-term current use of insulin (HCC) 02/22/2024   Neuropathy 02/22/2024   History of colonic polyps 03/05/2023   Adenomatous polyp of colon 03/05/2023   Mitral regurgitation 01/17/2016   LVH (left ventricular hypertrophy) 01/01/2016   Low testosterone  05/14/2015   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   Vitamin D  deficiency 04/26/2015   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   Class 3 severe obesity with serious comorbidity and body mass index (BMI) of 40.0 to 44.9 in adult    ED (erectile dysfunction)    Hyperlipidemia    OSA (obstructive sleep apnea)    HTN (hypertension) 04/18/2012   Past Medical History:  Diagnosis Date   BPH (benign prostatic hypertrophy)    Complete rotator cuff tear 08/08/2015  Diabetes mellitus without complication (HCC)    ED (erectile dysfunction)    HTN (hypertension) 04/18/2012   Hyperlipidemia    Low testosterone     LVH (left ventricular hypertrophy)    Morbidly obese (HCC)    OSA (obstructive sleep apnea)    cpap - does not know settings    Pre-diabetes    Rotator cuff tear    RT   Torn Achilles tendon 07/11/2015    Torn Achilles tendon 07/11/2015   Past Surgical History:  Procedure Laterality Date   CIRCUMCISION N/A 10/04/2023   Procedure: CIRCUMCISION ADULT;  Surgeon: Penne Knee, MD;  Location: [facility] ORS;  Service: Urology;  Laterality: N/A;   COLONOSCOPY WITH PROPOFOL  N/A 02/18/2018   Procedure: COLONOSCOPY WITH PROPOFOL ;  Surgeon: Therisa Bi, MD;  Location: [facility] ENDOSCOPY;  Service: Gastroenterology;  Laterality: N/A;   COLONOSCOPY WITH PROPOFOL  N/A 03/05/2023   Procedure: COLONOSCOPY WITH PROPOFOL ;  Surgeon: Therisa Bi, MD;  Location: [facility] ENDOSCOPY;  Service: Gastroenterology;  Laterality: N/A;   DOPPLER ECHOCARDIOGRAPHY  2013   left kene arthroscopy      LUMBAR LAMINECTOMY/DECOMPRESSION MICRODISCECTOMY N/A 01/30/2015   Procedure: MICRO LUMBAR DECOMPRESSION, BILATERAL FORAMINOTOMIES, MICRODISCECTOMY LUMBAR FOUR TO LUMBAR FIVE;  Surgeon: Reyes Billing, MD;  Location: [facility];  Service: Orthopedics;  Laterality: N/A;   right knee surgery     ROTATOR CUFF REPAIR  2007   Left   SHOULDER OPEN ROTATOR CUFF REPAIR Right 08/08/2015   Procedure: RIGHT SHOULDER MINI OPEN ROTATOR CUFF REPAIR SUBACROMIAL DECOMPRESSION ;  Surgeon: Reyes Billing, MD;  Location: [facility];  Service: Orthopedics;  Laterality: Right;   Social History   Tobacco Use   Smoking status: Former    Current packs/day: 0.00    Average packs/day: 0.5 packs/day for 30.0 years (15.0 ttl pk-yrs)    Types: Cigarettes    Start date: 10/19/1974    Quit date: 10/19/2004    Years since quitting: 19.7   Smokeless tobacco: Never  Vaping Use   Vaping status: Never Used  Substance Use Topics   Alcohol use: Yes    Alcohol/week: 4.0 standard drinks of alcohol    Types: 4 Shots of liquor per week    Comment: occasional   Drug use: No   Social History   Socioeconomic History   Marital status: Single    Spouse name: Not on file   Number of children: Not on file   Years of education: Not on file   Highest education level: Some college, no  degree  Occupational History    Employer: [employer]    Employer: [employer]  Tobacco Use   Smoking status: Former    Current packs/day: 0.00    Average packs/day: 0.5 packs/day for 30.0 years (15.0 ttl pk-yrs)    Types: Cigarettes    Start date: 10/19/1974    Quit date: 10/19/2004    Years since quitting: 19.7   Smokeless tobacco: Never  Vaping Use   Vaping status: Never Used  Substance and Sexual Activity   Alcohol use: Yes    Alcohol/week: 4.0 standard drinks of alcohol    Types: 4 Shots of liquor per week    Comment: occasional   Drug use: No   Sexual activity: Yes    Birth control/protection: None  Other Topics Concern   Not on file  Social History Narrative   Not on file   Social Drivers of Health   Financial Resource Strain: Low Risk  (06/19/2024)   Overall Financial Resource Strain (CARDIA)  Difficulty of Paying Living Expenses: Not hard at all  Food Insecurity: No Food Insecurity (06/19/2024)   Hunger Vital Sign    Worried About Running Out of Food in the Last Year: Never true    Ran Out of Food in the Last Year: Never true  Transportation Needs: No Transportation Needs (06/19/2024)   PRAPARE - Administrator, Civil Service (Medical): No    Lack of Transportation (Non-Medical): No  Physical Activity: Insufficiently Active (06/19/2024)   Exercise Vital Sign    Days of Exercise per Week: 4 days    Minutes of Exercise per Session: 30 min  Stress: No Stress Concern Present (06/19/2024)   [employer] of Occupational Health - Occupational Stress Questionnaire    Feeling of Stress: Not at all  Social Connections: Socially Isolated (06/19/2024)   Social Connection and Isolation Panel    Frequency of Communication with Friends and Family: More than three times a week    Frequency of Social Gatherings with Friends and Family: Three times a week    Attends Religious Services: Never    Active Member of Clubs or Organizations: No    Attends Banker Meetings:  Not on file    Marital Status: Divorced  Intimate Partner Violence: Not At Risk (12/25/2021)   Humiliation, Afraid, Rape, and Kick questionnaire    Fear of Current or Ex-Partner: No    Emotionally Abused: No    Physically Abused: No    Sexually Abused: No   Family Status  Relation Name Status   Mother Joesphine menghini Deceased       dementia   Father Archie stansbury Deceased       colon cancer   Sister 1 Alive   MGM  Deceased       dementia   MGF  Deceased       unknown   PGM  Deceased       unknown   PGF  Deceased       unknown   Neg Hx  (Not Specified)  No partnership data on file   Family History  Problem Relation Age of Onset   Dementia Mother    Hypertension Mother    Varicose Veins Mother    Cancer Father        colon   Heart disease Father    Congestive Heart Failure Father    Hypertension Sister    Dementia Maternal Grandmother    Kidney disease Neg Hx    Prostate cancer Neg Hx    COPD Neg Hx    Diabetes Neg Hx    Stroke Neg Hx    Kidney cancer Neg Hx    Bladder Cancer Neg Hx    No Known Allergies    Review of Systems  All other systems reviewed and are negative.     Objective:     BP 132/78 (Cuff Size: Large)   Pulse 78   Temp 98 F (36.7 C) (Oral)   Resp 16   Ht 5' 11 (1.803 m)   Wt 277 lb 3.2 oz (125.7 kg)   SpO2 97%   BMI 38.66 kg/m  BP Readings from Last 3 Encounters:  05/26/24 125/75  02/22/24 132/70  11/26/23 138/78   Wt Readings from Last 3 Encounters:  05/26/24 280 lb (127 kg)  05/26/24 280 lb (127 kg)  02/22/24 286 lb (129.7 kg)      Physical Exam Constitutional:      Appearance: Normal appearance.  HENT:  Head: Normocephalic and atraumatic.     Mouth/Throat:     Mouth: Mucous membranes are moist.     Pharynx: Oropharynx is clear.  Eyes:     Extraocular Movements: Extraocular movements intact.     Conjunctiva/sclera: Conjunctivae normal.     Pupils: Pupils are equal, round, and reactive to light.   Skin:    General: Skin is warm.  Neurological:     General: No focal deficit present.     Mental Status: He is alert. Mental status is at baseline.  Psychiatric:        Mood and Affect: Mood normal.        Behavior: Behavior normal.      Results for orders placed or performed in visit on 07/18/24  POCT HgB A1C  Result Value Ref Range   Hemoglobin A1C 5.6 4.0 - 5.6 %   HbA1c POC (<> result, manual entry)     HbA1c, POC (prediabetic range)     HbA1c, POC (controlled diabetic range)      Last CBC Lab Results  Component Value Date   WBC 4.3 08/24/2023   HGB 14.5 05/26/2024   HCT 48.5 05/26/2024   MCV 65.7 (L) 08/24/2023   MCH 20.1 (L) 08/24/2023   RDW 18.4 (H) 08/24/2023   PLT 280 08/24/2023   Last metabolic panel Lab Results  Component Value Date   GLUCOSE 92 07/14/2024   NA 139 07/14/2024   K 3.8 07/14/2024   CL 106 07/14/2024   CO2 26 07/14/2024   BUN 14 07/14/2024   CREATININE 1.04 07/14/2024   GFRNONAA >60 07/14/2024   CALCIUM  9.0 07/14/2024   PROT 6.7 07/14/2024   ALBUMIN 4.0 07/14/2024   LABGLOB 1.9 02/21/2016   AGRATIO 2.2 02/21/2016   BILITOT 0.4 07/14/2024   ALKPHOS 66 07/14/2024   AST 19 07/14/2024   ALT 18 07/14/2024   ANIONGAP 7 07/14/2024   Last lipids Lab Results  Component Value Date   CHOL 125 08/24/2023   HDL 52 08/24/2023   LDLCALC 56 08/24/2023   TRIG 87 08/24/2023   CHOLHDL 2.4 08/24/2023   Last hemoglobin A1c Lab Results  Component Value Date   HGBA1C 5.6 07/18/2024   Last thyroid functions Lab Results  Component Value Date   TSH 0.55 12/29/2019   Last vitamin D  Lab Results  Component Value Date   VD25OH 57 08/24/2023   Last vitamin B12 and Folate Lab Results  Component Value Date   VITAMINB12 527 01/12/2020   FOLATE 25.0 01/12/2020      The ASCVD Risk score (Arnett DK, et al., 2019) failed to calculate for the following reasons:   The valid total cholesterol range is 130 to 320 mg/dL    Assessment & Plan:    Assessment & Plan Type 2 diabetes mellitus with diabetic peripheral neuropathy A1c at 5.6 indicates excellent glycemic control. Neuropathy symptoms decreased, likely due to improved blood sugar levels. Gabapentin  ineffective and discontinued. Neuropathy irreversible but manageable with glycemic control. Ozempic  aids in blood sugar control but may cause GI side effects. - Continue Ozempic  1 mg for glycemic control. - Discontinue gabapentin . - Monitor neuropathy symptoms and report if he worsens.  Hypertension Blood pressure controlled at 132/78 mmHg. Current medications include amlodipine , carvedilol , and valsartan  hydrochlorothiazide . Carvedilol  dosage needs confirmation. - Refill amlodipine  10 mg. - Refill valsartan  hydrochlorothiazide . - Confirm carvedilol  dosage and refill accordingly.  Hyperlipidemia Cholesterol management with atorvastatin  10 mg ongoing. - Refill atorvastatin  10 mg.  Tubular adenoma of colon  Previous colonoscopy showed precancerous tubular adenoma. Follow-up colonoscopy recommended in 5 years to monitor for recurrence. - Schedule follow-up colonoscopy in 2029.  - POCT HgB A1C - Semaglutide , 1 MG/DOSE, 4 MG/3ML SOPN; Inject 1 mg as directed once a week.  Dispense: 9 mL; Refill: 1 - amLODipine  (NORVASC ) 10 MG tablet; Take 1 tablet (10 mg total) by mouth daily.  Dispense: 90 tablet; Refill: 1 - valsartan -hydrochlorothiazide  (DIOVAN -HCT) 320-25 MG tablet; Take 1 tablet by mouth daily.  Dispense: 90 tablet; Refill: 1 - atorvastatin  (LIPITOR) 10 MG tablet; Take 1 tablet (10 mg total) by mouth daily.  Dispense: 90 tablet; Refill: 1   Return in about 6 months (around 01/15/2025).    Sharyle Fischer, DO

## 2024-07-18 NOTE — Telephone Encounter (Signed)
 Requested Prescriptions  Refused Prescriptions Disp Refills   amLODipine  (NORVASC ) 10 MG tablet [Pharmacy Med Name: AMLODIPINE  BESYLATE 10 MG TAB] 90 tablet 0    Sig: TAKE 1 TABLET BY MOUTH EVERY DAY     Cardiovascular: Calcium  Channel Blockers 2 Passed - 07/18/2024 10:29 AM      Passed - Last BP in normal range    BP Readings from Last 1 Encounters:  07/18/24 132/78         Passed - Last Heart Rate in normal range    Pulse Readings from Last 1 Encounters:  07/18/24 78         Passed - Valid encounter within last 6 months    Recent Outpatient Visits           Today Type 2 diabetes mellitus with diabetic polyneuropathy, without long-term current use of insulin [facility])   [facility] Bernardo Fend, DO   4 months ago Type 2 diabetes mellitus without complication, without long-term current use of insulin (HCC)   [place] [facility] [place], Leisa, PA-C               atorvastatin  (LIPITOR) 10 MG tablet [Pharmacy Med Name: ATORVASTATIN  10 MG TABLET] 90 tablet 0    Sig: TAKE 1 TABLET BY MOUTH EVERY DAY     Cardiovascular:  Antilipid - Statins Failed - 07/18/2024 10:29 AM      Failed - Lipid Panel in normal range within the last 12 months    Cholesterol, Total  Date Value Ref Range Status  02/21/2016 137 100 - 199 mg/dL Final   Cholesterol  Date Value Ref Range Status  08/24/2023 125 <200 mg/dL Final   LDL Cholesterol (Calc)  Date Value Ref Range Status  08/24/2023 56 mg/dL (calc) Final    Comment:    Reference range: <100 . Desirable range <100 mg/dL for primary prevention;   <70 mg/dL for patients with CHD or diabetic patients  with > or = 2 CHD risk factors. SABRA LDL-C is now calculated using the Martin-Hopkins  calculation, which is a validated novel method providing  better accuracy than the Friedewald equation in the  estimation of LDL-C.  Gladis APPLETHWAITE et al. SANDREA. 7986;689(80): 2061-2068   (http://education.[facility].com/faq/FAQ164)    HDL  Date Value Ref Range Status  08/24/2023 52 > OR = 40 mg/dL Final  94/94/7982 48 >60 mg/dL Final   Triglycerides  Date Value Ref Range Status  08/24/2023 87 <150 mg/dL Final         Passed - Patient is not pregnant      Passed - Valid encounter within last 12 months    Recent Outpatient Visits           Today Type 2 diabetes mellitus with diabetic polyneuropathy, without long-term current use of insulin [facility])   [place] [facility] Bernardo Fend, DO   4 months ago Type 2 diabetes mellitus without complication, without long-term current use of insulin [facility])   [facility] Leavy Mole, PA-C

## 2024-09-16 ENCOUNTER — Other Ambulatory Visit: Payer: Self-pay | Admitting: Urology

## 2024-09-16 DIAGNOSIS — E291 Testicular hypofunction: Secondary | ICD-10-CM

## 2024-09-18 ENCOUNTER — Other Ambulatory Visit: Payer: Self-pay | Admitting: Urology

## 2024-10-10 ENCOUNTER — Encounter: Payer: Self-pay | Admitting: Internal Medicine

## 2024-11-21 ENCOUNTER — Other Ambulatory Visit: Payer: Self-pay | Admitting: Urology

## 2024-11-21 DIAGNOSIS — E291 Testicular hypofunction: Secondary | ICD-10-CM

## 2024-11-21 NOTE — Telephone Encounter (Signed)
 Appt scheduled, labs ordered.

## 2024-11-30 ENCOUNTER — Other Ambulatory Visit

## 2024-12-07 ENCOUNTER — Ambulatory Visit: Admitting: Urology

## 2025-01-19 ENCOUNTER — Ambulatory Visit: Admitting: Family Medicine
